# Patient Record
Sex: Male | Born: 1940
Health system: Southern US, Community
[De-identification: ages and names within clinical notes are randomized; demographics above are authoritative.]

## PROBLEM LIST (undated history)

## (undated) DIAGNOSIS — N281 Cyst of kidney, acquired: Secondary | ICD-10-CM

## (undated) DIAGNOSIS — J189 Pneumonia, unspecified organism: Secondary | ICD-10-CM

## (undated) DIAGNOSIS — F329 Major depressive disorder, single episode, unspecified: Secondary | ICD-10-CM

## (undated) DIAGNOSIS — Z973 Presence of spectacles and contact lenses: Secondary | ICD-10-CM

## (undated) DIAGNOSIS — N400 Enlarged prostate without lower urinary tract symptoms: Secondary | ICD-10-CM

## (undated) DIAGNOSIS — E785 Hyperlipidemia, unspecified: Secondary | ICD-10-CM

## (undated) DIAGNOSIS — Z86010 Personal history of colonic polyps: Secondary | ICD-10-CM

## (undated) DIAGNOSIS — Z87442 Personal history of urinary calculi: Secondary | ICD-10-CM

## (undated) DIAGNOSIS — N200 Calculus of kidney: Secondary | ICD-10-CM

## (undated) DIAGNOSIS — Z905 Acquired absence of kidney: Secondary | ICD-10-CM

## (undated) DIAGNOSIS — C679 Malignant neoplasm of bladder, unspecified: Secondary | ICD-10-CM

## (undated) DIAGNOSIS — Z85528 Personal history of other malignant neoplasm of kidney: Secondary | ICD-10-CM

## (undated) DIAGNOSIS — R972 Elevated prostate specific antigen [PSA]: Secondary | ICD-10-CM

## (undated) DIAGNOSIS — F419 Anxiety disorder, unspecified: Secondary | ICD-10-CM

## (undated) DIAGNOSIS — Z860101 Personal history of adenomatous and serrated colon polyps: Secondary | ICD-10-CM

## (undated) DIAGNOSIS — F32A Depression, unspecified: Secondary | ICD-10-CM

## (undated) HISTORY — PX: KNEE SURGERY: SHX244

## (undated) HISTORY — DX: Anxiety disorder, unspecified: F41.9

## (undated) HISTORY — DX: Hyperlipidemia, unspecified: E78.5

## (undated) HISTORY — DX: Major depressive disorder, single episode, unspecified: F32.9

## (undated) HISTORY — PX: APPENDECTOMY: SHX54

## (undated) HISTORY — DX: Depression, unspecified: F32.A

---

## 2000-11-08 ENCOUNTER — Ambulatory Visit (HOSPITAL_COMMUNITY): Admission: RE | Admit: 2000-11-08 | Discharge: 2000-11-08 | Payer: Self-pay | Admitting: Gastroenterology

## 2002-05-02 ENCOUNTER — Emergency Department (HOSPITAL_COMMUNITY): Admission: EM | Admit: 2002-05-02 | Discharge: 2002-05-02 | Payer: Self-pay | Admitting: Emergency Medicine

## 2004-01-18 ENCOUNTER — Encounter: Admission: RE | Admit: 2004-01-18 | Discharge: 2004-01-18 | Payer: Self-pay | Admitting: Family Medicine

## 2004-01-26 ENCOUNTER — Encounter: Admission: RE | Admit: 2004-01-26 | Discharge: 2004-01-26 | Payer: Self-pay | Admitting: Family Medicine

## 2004-02-08 ENCOUNTER — Ambulatory Visit (HOSPITAL_COMMUNITY): Admission: RE | Admit: 2004-02-08 | Discharge: 2004-02-08 | Payer: Self-pay | Admitting: Urology

## 2007-11-14 HISTORY — PX: EXTRACORPOREAL SHOCK WAVE LITHOTRIPSY: SHX1557

## 2010-03-04 ENCOUNTER — Emergency Department (HOSPITAL_COMMUNITY): Admission: EM | Admit: 2010-03-04 | Discharge: 2010-03-04 | Payer: Self-pay | Admitting: Emergency Medicine

## 2010-09-15 ENCOUNTER — Ambulatory Visit: Payer: Self-pay | Admitting: Cardiovascular Disease

## 2011-12-20 DIAGNOSIS — R972 Elevated prostate specific antigen [PSA]: Secondary | ICD-10-CM | POA: Diagnosis not present

## 2011-12-20 DIAGNOSIS — N138 Other obstructive and reflux uropathy: Secondary | ICD-10-CM | POA: Diagnosis not present

## 2011-12-20 DIAGNOSIS — N401 Enlarged prostate with lower urinary tract symptoms: Secondary | ICD-10-CM | POA: Diagnosis not present

## 2012-01-01 DIAGNOSIS — Z79899 Other long term (current) drug therapy: Secondary | ICD-10-CM | POA: Diagnosis not present

## 2012-01-03 DIAGNOSIS — R972 Elevated prostate specific antigen [PSA]: Secondary | ICD-10-CM | POA: Diagnosis not present

## 2012-01-03 DIAGNOSIS — N401 Enlarged prostate with lower urinary tract symptoms: Secondary | ICD-10-CM | POA: Diagnosis not present

## 2012-01-05 DIAGNOSIS — K140 Glossitis: Secondary | ICD-10-CM | POA: Diagnosis not present

## 2012-01-06 ENCOUNTER — Telehealth: Payer: Self-pay

## 2012-01-06 ENCOUNTER — Ambulatory Visit (INDEPENDENT_AMBULATORY_CARE_PROVIDER_SITE_OTHER): Payer: Medicare Other | Admitting: Family Medicine

## 2012-01-06 VITALS — BP 106/63 | HR 60 | Temp 98.0°F | Resp 16 | Ht 65.5 in | Wt 144.2 lb

## 2012-01-06 DIAGNOSIS — Q383 Other congenital malformations of tongue: Secondary | ICD-10-CM | POA: Diagnosis not present

## 2012-01-06 NOTE — Telephone Encounter (Signed)
Would like to speak to someone clinical. Patient of Dr L.  Trying to evaluate if he should come in now or Monday.  reacrion tr

## 2012-01-06 NOTE — Progress Notes (Signed)
71 year old man who comes in with tongue anomaly after being on Lamisil for one month. The problem began about a month ago after he started the Lamisil for a toenail fungus. He saw a provider at the [facility] clinic yesterday who thought that the glossitis might E. coming from the Lamisil and he was referred here. The patient was worried he might have cancer.  Objective: Patient has an atrophic area on the side of his tongue with fissures.  Liver and abdomen are negative.  Assessment: Stricking geographic tongue and glossitis probably related to the Lamisil. Given the fact that onychomycosis usually returns after Lamisil is discontinued, I suggested that patient discontinue Lamisil and see what happens. We will be getting a complete physical in 3 weeks and can follow up at that time.

## 2012-01-09 NOTE — Telephone Encounter (Signed)
Left message to CB

## 2012-01-09 NOTE — Telephone Encounter (Signed)
Pt came in for OV this same afternoon and saw Dr Milus Glazier. No need for call back

## 2012-01-23 ENCOUNTER — Encounter: Payer: Self-pay | Admitting: Cardiovascular Disease

## 2012-03-28 DIAGNOSIS — N201 Calculus of ureter: Secondary | ICD-10-CM | POA: Diagnosis not present

## 2012-03-28 DIAGNOSIS — N200 Calculus of kidney: Secondary | ICD-10-CM | POA: Diagnosis not present

## 2012-03-28 DIAGNOSIS — N281 Cyst of kidney, acquired: Secondary | ICD-10-CM | POA: Diagnosis not present

## 2012-03-28 DIAGNOSIS — R3129 Other microscopic hematuria: Secondary | ICD-10-CM | POA: Diagnosis not present

## 2012-03-28 DIAGNOSIS — K8020 Calculus of gallbladder without cholecystitis without obstruction: Secondary | ICD-10-CM | POA: Diagnosis not present

## 2012-04-15 DIAGNOSIS — N200 Calculus of kidney: Secondary | ICD-10-CM | POA: Diagnosis not present

## 2012-04-17 ENCOUNTER — Encounter: Payer: Self-pay | Admitting: Cardiovascular Disease

## 2012-04-25 ENCOUNTER — Encounter: Payer: Medicare Other | Admitting: Family Medicine

## 2012-05-23 ENCOUNTER — Ambulatory Visit (INDEPENDENT_AMBULATORY_CARE_PROVIDER_SITE_OTHER): Payer: Medicare Other | Admitting: Family Medicine

## 2012-05-23 VITALS — BP 138/88 | HR 50 | Temp 98.3°F | Resp 16 | Ht 65.0 in | Wt 139.8 lb

## 2012-05-23 DIAGNOSIS — L0390 Cellulitis, unspecified: Secondary | ICD-10-CM

## 2012-05-23 DIAGNOSIS — L02419 Cutaneous abscess of limb, unspecified: Secondary | ICD-10-CM | POA: Diagnosis not present

## 2012-05-23 DIAGNOSIS — L03119 Cellulitis of unspecified part of limb: Secondary | ICD-10-CM | POA: Diagnosis not present

## 2012-05-23 MED ORDER — DOXYCYCLINE HYCLATE 100 MG PO TABS: 100.0000 mg | ORAL_TABLET | Freq: Two times a day (BID) | ORAL | Status: AC

## 2012-05-23 NOTE — Patient Instructions (Addendum)

## 2012-05-23 NOTE — Progress Notes (Signed)
3 days of progressive redness and itchiness right leg just below the knee  O:  Vesicular rash with surrounding erythema just below the joint line of the right knee.  A:  Cellulitis, early and mild  1. Cellulitis  doxycycline (VIBRA-TABS) 100 MG tablet

## 2012-06-27 ENCOUNTER — Ambulatory Visit (INDEPENDENT_AMBULATORY_CARE_PROVIDER_SITE_OTHER): Payer: Medicare Other | Admitting: Family Medicine

## 2012-06-27 ENCOUNTER — Encounter: Payer: Self-pay | Admitting: Family Medicine

## 2012-06-27 VITALS — BP 143/67 | HR 53 | Temp 98.1°F | Resp 16 | Ht 64.5 in | Wt 138.0 lb

## 2012-06-27 DIAGNOSIS — J069 Acute upper respiratory infection, unspecified: Secondary | ICD-10-CM

## 2012-06-27 DIAGNOSIS — E785 Hyperlipidemia, unspecified: Secondary | ICD-10-CM | POA: Diagnosis not present

## 2012-06-27 DIAGNOSIS — E559 Vitamin D deficiency, unspecified: Secondary | ICD-10-CM | POA: Diagnosis not present

## 2012-06-27 DIAGNOSIS — J3489 Other specified disorders of nose and nasal sinuses: Secondary | ICD-10-CM | POA: Diagnosis not present

## 2012-06-27 DIAGNOSIS — Z0000 Encounter for general adult medical examination without abnormal findings: Secondary | ICD-10-CM

## 2012-06-27 DIAGNOSIS — R0981 Nasal congestion: Secondary | ICD-10-CM

## 2012-06-27 LAB — COMPREHENSIVE METABOLIC PANEL
ALT: 29 U/L (ref 0–53)
AST: 31 U/L (ref 0–37)
Albumin: 4.5 g/dL (ref 3.5–5.2)
Alkaline Phosphatase: 67 U/L (ref 39–117)
BUN: 23 mg/dL (ref 6–23)
CO2: 28 mEq/L (ref 19–32)
Calcium: 9.6 mg/dL (ref 8.4–10.5)
Chloride: 104 mEq/L (ref 96–112)
Creat: 0.94 mg/dL (ref 0.50–1.35)
Glucose, Bld: 75 mg/dL (ref 70–99)
Potassium: 4.6 mEq/L (ref 3.5–5.3)
Sodium: 140 mEq/L (ref 135–145)
Total Bilirubin: 0.8 mg/dL (ref 0.3–1.2)
Total Protein: 7.1 g/dL (ref 6.0–8.3)

## 2012-06-27 LAB — LIPID PANEL
Cholesterol: 177 mg/dL (ref 0–200)
HDL: 49 mg/dL (ref >39)
LDL Cholesterol: 109 mg/dL — ABNORMAL HIGH (ref 0–99)
Total CHOL/HDL Ratio: 3.6 Ratio
Triglycerides: 94 mg/dL (ref <150)
VLDL: 19 mg/dL (ref 0–40)

## 2012-06-27 LAB — POCT URINALYSIS DIPSTICK
Bilirubin, UA: NEGATIVE
Glucose, UA: NEGATIVE
Ketones, UA: NEGATIVE
Leukocytes, UA: NEGATIVE
Nitrite, UA: NEGATIVE
Protein, UA: NEGATIVE
Spec Grav, UA: >=1.03
Urobilinogen, UA: 0.2
pH, UA: 5.5

## 2012-06-27 LAB — CBC
HCT: 42.7 % (ref 39.0–52.0)
Hemoglobin: 15 g/dL (ref 13.0–17.0)
MCH: 29.5 pg (ref 26.0–34.0)
MCHC: 35.1 g/dL (ref 30.0–36.0)
MCV: 83.9 fL (ref 78.0–100.0)
Platelets: 267 10*3/uL (ref 150–400)
RBC: 5.09 MIL/uL (ref 4.22–5.81)
RDW: 13.1 % (ref 11.5–15.5)
WBC: 8.8 10*3/uL (ref 4.0–10.5)

## 2012-06-27 LAB — IFOBT (OCCULT BLOOD): IFOBT: NEGATIVE

## 2012-06-27 NOTE — Progress Notes (Signed)
@[email or web address] @  Patient ID: Alan Mendez MRN: [id number], DOB: 04-27-1941 71 y.o. Date of Encounter: 06/27/2012, 9:23 AM  Primary Physician: No primary provider on file.  Chief Complaint: Physical (CPE)  HPI: 71 y.o. y/o male with history noted below here for CPE.  Doing well. No issues/complaints.  Review of Systems:  Recent URI x 4 days with sinus congestion Consitutional: No fever, chills, fatigue, night sweats, lymphadenopathy, or weight changes. Eyes: No visual changes, eye redness, or discharge. ENT/Mouth: Ears: No otalgia, tinnitus, hearing loss, discharge. Nose: No congestion, rhinorrhea, sinus pain, or epistaxis. Throat: No sore throat, post nasal drip, or teeth pain. Cardiovascular: No CP, palpitations, diaphoresis, DOE, edema, orthopnea, PND. Respiratory: No cough, hemoptysis, SOB, or wheezing. Gastrointestinal: No anorexia, dysphagia, reflux, pain, nausea, vomiting, hematemesis, diarrhea, constipation, BRBPR, or melena. Genitourinary: No dysuria, frequency, urgency, hematuria, incontinence, nocturia, decreased urinary stream, discharge, impotence, or testicular pain/masses. Musculoskeletal: No decreased ROM, myalgias, stiffness, joint swelling, or weakness. Skin: No rash, erythema, lesion changes, pain, warmth, jaundice, or pruritis. Neurological: No headache, dizziness, syncope, seizures, tremors, memory loss, coordination problems, or paresthesias. Psychological: No anxiety, depression, hallucinations, SI/HI. Endocrine: No fatigue, polydipsia, polyphagia, polyuria, or known diabetes. All other systems were reviewed and are otherwise negative.  Past Medical History  Diagnosis Date  . HLD (hyperlipidemia)   . Depression   . Anxiety   . Chronic kidney disease   . Gall stones      Past Surgical History  Procedure Date  . Appendectomy   . Knee surgery     Home Meds:  Prior to Admission medications   Medication Sig Start Date End Date Taking? Authorizing Provider    buPROPion (WELLBUTRIN XL) 300 MG 24 hr tablet Take 300 mg by mouth daily.   Yes Historical Provider, MD  fish oil-omega-3 fatty acids 1000 MG capsule Take 2 g by mouth daily.   Yes Historical Provider, MD  rosuvastatin (CRESTOR) 10 MG tablet Take 10 mg by mouth daily.   Yes Historical Provider, MD  terbinafine (LAMISIL) 250 MG tablet Take 250 mg by mouth daily.    Historical Provider, MD    Allergies:  Allergies  Allergen Reactions  . Lamisil (Terbinafine Hcl)     History   Social History  . Marital Status: Single    Spouse Name: N/A    Number of Children: N/A  . 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 Smoking status: Former Smoker -- 1.5 packs/day for 25 years    Quit date: 11/13/1988  . Smokeless tobacco: Not on file  . Alcohol Use: 1.0 oz/week    2 drink(s) per week  . Drug Use: Not on file  . Sexually Active: Not on file   Other Topics Concern  . Not on file   Social History Narrative  . No narrative on file    Family History  Problem Relation Age of Onset  . Heart disease Mother   . Cancer Father   . Cancer Paternal Grandmother   . Heart disease Paternal Grandfather     Physical Exam: Blood pressure 143/67, pulse 53, temperature 98.1 F (36.7 C), resp. rate 16, height 5' 4.5" (1.638 m), weight 138 lb (62.596 kg).  General: Well developed, well nourished, in no acute distress. HEENT: Normocephalic, atraumatic. Conjunctiva pink, sclera non-icteric. Pupils 2 mm constricting to 1 mm, round, regular, and equally reactive to light and accomodation. EOMI. Internal auditory canal clear. TMs  with good cone of light and without pathology. Nasal mucosa pink. Nares are without discharge. No sinus tenderness. Oral mucosa pink. Dentition good. Pharynx without exudate.   Neck: Supple. Trachea midline. No thyromegaly. Full ROM. No lymphadenopathy. Lungs: Clear to auscultation bilaterally without wheezes,  rales, or rhonchi. Breathing is of normal effort and unlabored. Cardiovascular: RRR with S1 S2. No murmurs, rubs, or gallops appreciated. Distal pulses 2+ symmetrically. No carotid or abdominal bruits Abdomen: Soft, non-tender, non-distended with normoactive bowel sounds. No hepatosplenomegaly or masses. No rebound/guarding. No CVA tenderness. Without hernias.  Rectal: No external hemorrhoids or fissures. Rectal vault without masses.  Genitourinary:  circumcised male. No penile lesions. Testes descended bilaterally, and smooth without tenderness or masses.  Musculoskeletal: Full range of motion and 5/5 strength throughout. Without swelling, atrophy, tenderness, crepitus, or warmth. Extremities without clubbing, cyanosis, or edema. Calves supple. Skin: Warm and moist without erythema, ecchymosis, wounds, or rash. Neuro: A+Ox3. CN II-XII grossly intact. Moves all extremities spontaneously. Full sensation throughout. Normal gait. DTR 2+ throughout upper and lower extremities. Finger to nose intact. Psych:  Responds to questions appropriately with a normal affect.   Studies:  Results for orders placed in visit on 06/27/12  IFOBT (OCCULT BLOOD)      Component Value Range   IFOBT Negative    POCT URINALYSIS DIPSTICK      Component Value Range   Color, UA yellow     Clarity, UA clear     Glucose, UA neg     Bilirubin, UA neg     Ketones, UA neg     Spec Grav, UA >=1.030     Blood, UA trace     pH, UA 5.5     Protein, UA neg     Urobilinogen, UA 0.2     Nitrite, UA neg     Leukocytes, UA Negative      EKG:  nonsp st-t wave changes  Assessment/Plan:  71 y.o. y/o  male here for CPE - 1. Healthcare maintenance  Ambulatory referral to Gastroenterology, CBC, Lipid panel, Comprehensive metabolic panel, IFOBT POC (occult bld, rslt in office), POCT urinalysis dipstick, Vitamin D, 25-hydroxy  2. Vitamin d deficiency  Vitamin D, 25-hydroxy  3. Hyperlipidemia  Lipid panel  4. Sinus congestion   CBC  5. Acute URI  CBC     Signed, Elvina Sidle, MD 06/27/2012 9:23 AM

## 2012-06-28 LAB — VITAMIN D 25 HYDROXY (VIT D DEFICIENCY, FRACTURES): Vit D, 25-Hydroxy: 47 ng/mL (ref 30–89)

## 2012-07-08 ENCOUNTER — Encounter: Payer: Self-pay | Admitting: Family Medicine

## 2012-07-08 DIAGNOSIS — H2510 Age-related nuclear cataract, unspecified eye: Secondary | ICD-10-CM | POA: Diagnosis not present

## 2012-07-29 DIAGNOSIS — K5900 Constipation, unspecified: Secondary | ICD-10-CM | POA: Diagnosis not present

## 2012-07-29 DIAGNOSIS — R197 Diarrhea, unspecified: Secondary | ICD-10-CM | POA: Diagnosis not present

## 2012-07-29 DIAGNOSIS — Z1211 Encounter for screening for malignant neoplasm of colon: Secondary | ICD-10-CM | POA: Diagnosis not present

## 2012-08-15 DIAGNOSIS — Z1211 Encounter for screening for malignant neoplasm of colon: Secondary | ICD-10-CM | POA: Diagnosis not present

## 2012-08-15 DIAGNOSIS — K649 Unspecified hemorrhoids: Secondary | ICD-10-CM | POA: Diagnosis not present

## 2012-08-15 DIAGNOSIS — D126 Benign neoplasm of colon, unspecified: Secondary | ICD-10-CM | POA: Diagnosis not present

## 2012-08-15 DIAGNOSIS — K5730 Diverticulosis of large intestine without perforation or abscess without bleeding: Secondary | ICD-10-CM | POA: Diagnosis not present

## 2012-08-15 HISTORY — PX: COLONOSCOPY: SHX174

## 2012-09-03 ENCOUNTER — Encounter: Payer: Self-pay | Admitting: Family Medicine

## 2012-09-03 DIAGNOSIS — K5790 Diverticulosis of intestine, part unspecified, without perforation or abscess without bleeding: Secondary | ICD-10-CM

## 2012-09-12 ENCOUNTER — Telehealth: Payer: Self-pay

## 2012-09-12 NOTE — Telephone Encounter (Signed)
Pt is out of Crestor and needs refill, but wanted to know if Dr. Milus Glazier recommended a less expensive generic, only if it is as good.  [phone] pt number  [employer]

## 2012-09-13 ENCOUNTER — Other Ambulatory Visit: Payer: Self-pay | Admitting: Family Medicine

## 2012-09-13 DIAGNOSIS — E785 Hyperlipidemia, unspecified: Secondary | ICD-10-CM

## 2012-09-13 DIAGNOSIS — Z23 Encounter for immunization: Secondary | ICD-10-CM | POA: Diagnosis not present

## 2012-09-13 MED ORDER — ROSUVASTATIN CALCIUM 10 MG PO TABS: 10.0000 mg | ORAL_TABLET | Freq: Every day | ORAL | Status: DC

## 2012-09-13 NOTE — Telephone Encounter (Signed)
Thanks, I have advised patient.  

## 2012-09-13 NOTE — Telephone Encounter (Signed)
Crestor is the safest and most effective.  I refilled it

## 2012-10-04 ENCOUNTER — Encounter: Payer: Self-pay | Admitting: Family Medicine

## 2012-12-31 DIAGNOSIS — N138 Other obstructive and reflux uropathy: Secondary | ICD-10-CM | POA: Diagnosis not present

## 2012-12-31 DIAGNOSIS — R972 Elevated prostate specific antigen [PSA]: Secondary | ICD-10-CM | POA: Diagnosis not present

## 2012-12-31 DIAGNOSIS — N401 Enlarged prostate with lower urinary tract symptoms: Secondary | ICD-10-CM | POA: Diagnosis not present

## 2013-01-28 DIAGNOSIS — R972 Elevated prostate specific antigen [PSA]: Secondary | ICD-10-CM | POA: Diagnosis not present

## 2013-08-27 DIAGNOSIS — Z23 Encounter for immunization: Secondary | ICD-10-CM | POA: Diagnosis not present

## 2013-09-18 ENCOUNTER — Ambulatory Visit (INDEPENDENT_AMBULATORY_CARE_PROVIDER_SITE_OTHER): Payer: Medicare Other | Admitting: Family Medicine

## 2013-09-18 ENCOUNTER — Encounter: Payer: Self-pay | Admitting: Family Medicine

## 2013-09-18 VITALS — BP 122/70 | HR 48 | Temp 98.2°F | Resp 16 | Ht 64.5 in | Wt 139.0 lb

## 2013-09-18 DIAGNOSIS — M4120 Other idiopathic scoliosis, site unspecified: Secondary | ICD-10-CM | POA: Insufficient documentation

## 2013-09-18 DIAGNOSIS — N400 Enlarged prostate without lower urinary tract symptoms: Secondary | ICD-10-CM

## 2013-09-18 DIAGNOSIS — Z0000 Encounter for general adult medical examination without abnormal findings: Secondary | ICD-10-CM | POA: Diagnosis not present

## 2013-09-18 DIAGNOSIS — N138 Other obstructive and reflux uropathy: Secondary | ICD-10-CM | POA: Insufficient documentation

## 2013-09-18 DIAGNOSIS — Z23 Encounter for immunization: Secondary | ICD-10-CM

## 2013-09-18 DIAGNOSIS — E785 Hyperlipidemia, unspecified: Secondary | ICD-10-CM

## 2013-09-18 LAB — POCT URINALYSIS DIPSTICK
Bilirubin, UA: NEGATIVE
Blood, UA: NEGATIVE
Glucose, UA: NEGATIVE
Ketones, UA: NEGATIVE
Leukocytes, UA: NEGATIVE
Nitrite, UA: NEGATIVE
Protein, UA: NEGATIVE
Spec Grav, UA: 1.025
Urobilinogen, UA: 1
pH, UA: 5.5

## 2013-09-18 LAB — COMPREHENSIVE METABOLIC PANEL
ALT: 23 U/L (ref 0–53)
AST: 32 U/L (ref 0–37)
Albumin: 4.4 g/dL (ref 3.5–5.2)
Alkaline Phosphatase: 69 U/L (ref 39–117)
BUN: 19 mg/dL (ref 6–23)
CO2: 28 mEq/L (ref 19–32)
Calcium: 10.2 mg/dL (ref 8.4–10.5)
Chloride: 103 mEq/L (ref 96–112)
Creat: 0.84 mg/dL (ref 0.50–1.35)
Glucose, Bld: 82 mg/dL (ref 70–99)
Potassium: 4.3 mEq/L (ref 3.5–5.3)
Sodium: 141 mEq/L (ref 135–145)
Total Bilirubin: 0.8 mg/dL (ref 0.3–1.2)
Total Protein: 7 g/dL (ref 6.0–8.3)

## 2013-09-18 LAB — CBC
HCT: 44.3 % (ref 39.0–52.0)
Hemoglobin: 15.8 g/dL (ref 13.0–17.0)
MCH: 29.2 pg (ref 26.0–34.0)
MCHC: 35.7 g/dL (ref 30.0–36.0)
MCV: 81.9 fL (ref 78.0–100.0)
Platelets: 284 10*3/uL (ref 150–400)
RBC: 5.41 MIL/uL (ref 4.22–5.81)
RDW: 13.4 % (ref 11.5–15.5)
WBC: 5.1 10*3/uL (ref 4.0–10.5)

## 2013-09-18 LAB — LIPID PANEL
Cholesterol: 203 mg/dL — ABNORMAL HIGH (ref 0–200)
HDL: 55 mg/dL (ref >39)
LDL Cholesterol: 131 mg/dL — ABNORMAL HIGH (ref 0–99)
Total CHOL/HDL Ratio: 3.7 Ratio
Triglycerides: 86 mg/dL (ref <150)
VLDL: 17 mg/dL (ref 0–40)

## 2013-09-18 MED ORDER — TETANUS-DIPHTH-ACELL PERTUSSIS 5-2.5-18.5 LF-MCG/0.5 IM SUSP: 0.5000 mL | Freq: Once | INTRAMUSCULAR | Status: DC

## 2013-09-18 MED ORDER — ROSUVASTATIN CALCIUM 10 MG PO TABS: 10.0000 mg | ORAL_TABLET | Freq: Every day | ORAL | Status: DC

## 2013-09-18 NOTE — Patient Instructions (Signed)
Health Maintenance, Males A healthy lifestyle and preventative care can promote health and wellness.  Maintain regular health, dental, and eye exams.  Eat a healthy diet. Foods like vegetables, fruits, whole grains, low-fat dairy products, and lean protein foods contain the nutrients you need without too many calories. Decrease your intake of foods high in solid fats, added sugars, and salt. Get information about a proper diet from your caregiver, if necessary.  Regular physical exercise is one of the most important things you can do for your health. Most adults should get at least 150 minutes of moderate-intensity exercise (any activity that increases your heart rate and causes you to sweat) each week. In addition, most adults need muscle-strengthening exercises on 2 or more days a week.   Maintain a healthy weight. The body mass index (BMI) is a screening tool to identify possible weight problems. It provides an estimate of body fat based on height and weight. Your caregiver can help determine your BMI, and can help you achieve or maintain a healthy weight. For adults 20 years and older:  A BMI below 18.5 is considered underweight.  A BMI of 18.5 to 24.9 is normal.  A BMI of 25 to 29.9 is considered overweight.  A BMI of 30 and above is considered obese.  Maintain normal blood lipids and cholesterol by exercising and minimizing your intake of saturated fat. Eat a balanced diet with plenty of fruits and vegetables. Blood tests for lipids and cholesterol should begin at age 20 and be repeated every 5 years. If your lipid or cholesterol levels are high, you are over 50, or you are a high risk for heart disease, you may need your cholesterol levels checked more frequently.Ongoing high lipid and cholesterol levels should be treated with medicines, if diet and exercise are not effective.  If you smoke, find out from your caregiver how to quit. If you do not use tobacco, do not start.  Lung  cancer screening is recommended for adults aged 55 80 years who are at high risk for developing lung cancer because of a history of smoking. Yearly low-dose computed tomography (CT) is recommended for people who have at least a 30-pack-year history of smoking and are a current smoker or have quit within the past 15 years. A pack year of smoking is smoking an average of 1 pack of cigarettes a day for 1 year (for example: 1 pack a day for 30 years or 2 packs a day for 15 years). Yearly screening should continue until the smoker has stopped smoking for at least 15 years. Yearly screening should also be stopped for people who develop a health problem that would prevent them from having lung cancer treatment.  If you choose to drink alcohol, do not exceed 2 drinks per day. One drink is considered to be 12 ounces (355 mL) of beer, 5 ounces (148 mL) of wine, or 1.5 ounces (44 mL) of liquor.  Avoid use of street drugs. Do not share needles with anyone. Ask for help if you need support or instructions about stopping the use of drugs.  High blood pressure causes heart disease and increases the risk of stroke. Blood pressure should be checked at least every 1 to 2 years. Ongoing high blood pressure should be treated with medicines if weight loss and exercise are not effective.  If you are 45 to 70 years old, ask your caregiver if you should take aspirin to prevent heart disease.  Diabetes screening involves taking a blood   sample to check your fasting blood sugar level. This should be done once every 3 years, after age 69, if you are within normal weight and without risk factors for diabetes. Testing should be considered at a younger age or be carried out more frequently if you are overweight and have at least 1 risk factor for diabetes.  Colorectal cancer can be detected and often prevented. Most routine colorectal cancer screening begins at the age of 3 and continues through age 32. However, your caregiver may  recommend screening at an earlier age if you have risk factors for colon cancer. On a yearly basis, your caregiver may provide home test kits to check for hidden blood in the stool. Use of a small camera at the end of a tube, to directly examine the colon (sigmoidoscopy or colonoscopy), can detect the earliest forms of colorectal cancer. Talk to your caregiver about this at age 85, when routine screening begins. Direct examination of the colon should be repeated every 5 to 10 years through age 103, unless early forms of pre-cancerous polyps or small growths are found.  Hepatitis C blood testing is recommended for all people born from [birth year] through 1963 and any individual with known risks for hepatitis C.  Healthy men should no longer receive prostate-specific antigen (PSA) blood tests as part of routine cancer screening. Consult with your caregiver about prostate cancer screening.  Testicular cancer screening is not recommended for adolescents or adult males who have no symptoms. Screening includes self-exam, caregiver exam, and other screening tests. Consult with your caregiver about any symptoms you have or any concerns you have about testicular cancer.  Practice safe sex. Use condoms and avoid high-risk sexual practices to reduce the spread of sexually transmitted infections (STIs).  Use sunscreen. Apply sunscreen liberally and repeatedly throughout the day. You should seek shade when your shadow is shorter than you. Protect yourself by wearing long sleeves, pants, a wide-brimmed hat, and sunglasses year round, whenever you are outdoors.  Notify your caregiver of new moles or changes in moles, especially if there is a change in shape or color. Also notify your caregiver if a mole is larger than the size of a pencil eraser.  A one-time screening for abdominal aortic aneurysm (AAA) and surgical repair of large AAAs by sound wave imaging (ultrasonography) is recommended for ages 59 to 68 years who are  current or former smokers.  Stay current with your immunizations. Document Released: 04/27/2008 Document Revised: 02/24/2013 Document Reviewed: 03/27/2011 [facility] Patient Information 2014 [place], [place]. Advance Directive Advance directives are the legal documents that allow you to make choices about your health care and medical treatment if you cannot speak for yourself. Advance directives are a way for you to communicate your wishes to family, friends, and health care providers. The specified people can then convey your decisions about end-of-life care to avoid confusion if you should become unable to communicate. Ideally, the process of discussing and writing advance directives should be discussed over time rather than making decisions all at once. Advance directives can be modified as your situation changes and you can change your mind at any time even after you have signed the advance directives. Each state has its own laws regarding advance directives. You may want to check with your health care provider, attorney, or state representative about the law in your state. Below are some examples of advance directives. LIVING WILL A living will is a set of instructions documenting your wishes about medical care when you  cannot care for yourself. It is used if you become:  Terminally ill.  Incapacitated.  Unable to communicate.  Unable to make decisions. Items to consider in your living will include:  The use or non-use of life-sustaining equipment, such as dialysis machines and breathing machines (ventilators).  A "do not resuscitate" (DNR) order, which is the instruction not to use CPR if breathing or heartbeat stops.  Tube feeding.  Withholding of food and fluids.  Comfort (palliative) care when the goal becomes comfort rather than a cure.  Organ and tissue donation. A living does not give instructions about distribution of your money and property if you should pass away. It is  advisable to seek the expert advice of a lawyer in drawing up a will regarding your possessions. Decisions about taxes, beneficiaries, and asset distribution will be legally binding. This process can relieve your family and friends of any burdens surrounding disputes or questions that may come up about the allocation of your assets. DO NOT RESUSCITATE (DNR) A do not resuscitate (DNR) order is a request to not have cardiopulmonary resuscitation (CPR) in the event that your heart stops or you stop breathing. Unless given other instructions, a health care provider will try to help any patient whose heart has stopped or who has stopped breathing.  HEALTHCARE PROXY AND DURABLE POWER OF ATTORNEY FOR HEALTH CARE A health care proxy is a person (agent) appointed to make medical decisions for you if you cannot. Generally, people choose someone they know well and trust to represent their preferences when they can no longer do so. You should be sure to ask this person for agreement to act as your agent. An agent may have to exercise judgment in the event of a medical decision for which your wishes are not known. The durable power of attorney for health care is the legal document that names your health care proxy. Once written, it should be:  Signed.  Notarized.  Dated.  Copied.  Witnessed.  Incorporated into your medical record. You may also want to appoint someone to manage your financial affairs if you cannot. This is called a durable power of attorney for finances. It is a separate legal document from the durable power of attorney for health care. You may choose the same person or someone different from your health care proxy to act as your agent in financial matters. Document Released: 02/06/2008 Document Revised: 07/02/2013 Document Reviewed: 03/19/2013 [facility] Patient Information 2014 [place], [place].

## 2013-09-18 NOTE — Progress Notes (Addendum)
Patient ID: Alan Mendez MRN: [id number], DOB: 05-Sep-1941 72 y.o. Date of Encounter: 09/18/2013, 7:55 AM  Primary Physician: No primary provider on file.  Chief Complaint: Physical (CPE)  HPI: 72 y.o. y/o male with history noted below here for CPE.  Married. 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  Review of Systems: Consitutional: No fever, chills, fatigue, night sweats, lymphadenopathy, or weight changes. Eyes: No visual changes, eye redness, or discharge. ENT/Mouth: Ears: No otalgia, tinnitus, hearing loss, discharge. Nose: No congestion, rhinorrhea, sinus pain, or epistaxis. Throat: No sore throat, post nasal drip, or teeth pain. Cardiovascular: No CP, palpitations, diaphoresis, DOE, edema, orthopnea, PND. Respiratory: No cough, hemoptysis, SOB, or wheezing. Gastrointestinal: No anorexia, dysphagia, reflux, pain, nausea, vomiting, hematemesis, diarrhea, constipation, BRBPR, or melena. Genitourinary: No dysuria, frequency, urgency, hematuria, incontinence, nocturia, decreased urinary stream, discharge, impotence, or testicular pain/masses. Musculoskeletal: No decreased ROM, myalgias, stiffness, joint swelling, or weakness. Skin: No rash, erythema, lesion changes, pain, warmth, jaundice, or pruritis. Neurological: No headache, dizziness, syncope, seizures, tremors, memory loss, coordination problems, or paresthesias. Psychological: No anxiety, depression, hallucinations, SI/HI. Endocrine: No fatigue, polydipsia, polyphagia, polyuria, or known diabetes. All other systems were reviewed and are otherwise negative.  Past Medical History  Diagnosis Date  . HLD (hyperlipidemia)   . Depression   . Anxiety   . Chronic kidney disease   . Gall stones      Past Surgical History  Procedure Laterality Date  . Appendectomy    . Knee surgery    . Colonoscopy  08/15/2012    Tubular Adenoma, No high grade dysplasia or malignacy.    Home Meds:  Prior to  Admission medications   Medication Sig Start Date End Date Taking? Authorizing Provider  aspirin 81 MG tablet Take 81 mg by mouth daily.   Yes Historical Provider, MD  Multiple Vitamins-Minerals (MULTIVITAMIN WITH MINERALS) tablet Take 1 tablet by mouth daily.   Yes Historical Provider, MD  rosuvastatin (CRESTOR) 10 MG tablet Take 1 tablet (10 mg total) by mouth daily. 09/13/12  Yes Elvina Sidle, MD  buPROPion (WELLBUTRIN XL) 300 MG 24 hr tablet Take 300 mg by mouth daily.    Historical Provider, MD    Allergies:  Allergies  Allergen Reactions  . Lamisil [Terbinafine Hcl]     History   Social History  . Marital Status: Single    Spouse Name: N/A    Number of Children: N/A  . 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 Smoking status: Former Smoker -- 1.50 packs/day for 25 years    Quit date: 11/13/1988  . Smokeless tobacco: Not on file  . Alcohol Use: 1.0 oz/week    2 drink(s) per week  . Drug Use: Not on file  . Sexual Activity: Not on file   Other Topics Concern  . Not on file   Social History Narrative  . No narrative on file    Family History  Problem Relation Age of Onset  . Heart disease Mother   . Cancer Father   . Cancer Paternal Grandmother   . Heart disease Paternal Grandfather     Physical Exam: Blood pressure 122/70, pulse 48, temperature 98.2 F (36.8 C), temperature source Oral, resp. rate 16, height 5' 4.5" (1.638 m), weight 139 lb (63.05 kg), SpO2 100.00%.  General: Well developed, well nourished, in no acute distress. HEENT: Normocephalic, atraumatic. Conjunctiva pink, sclera  non-icteric. Pupils 2 mm constricting to 1 mm, round, regular, and equally reactive to light and accomodation. EOMI. Internal auditory canal clear. TMs with good cone of light and without pathology. Nasal mucosa pink. Nares are without discharge. No sinus tenderness. Oral mucosa pink. Dentition excellent. Pharynx  without exudate.   Neck: Supple. Trachea midline. No thyromegaly. Full ROM. No lymphadenopathy. Lungs: Clear to auscultation bilaterally without wheezes, rales, or rhonchi. Breathing is of normal effort and unlabored. Cardiovascular: RRR with S1 S2. No murmurs, rubs, or gallops appreciated. Distal pulses 2+ symmetrically. No carotid or abdominal bruits Abdomen: Soft, non-tender, non-distended with normoactive bowel sounds. No hepatosplenomegaly or masses. No rebound/guarding. No CVA tenderness. Without hernias.   Genitourinary:  circumcised male. No penile lesions. Testes descended bilaterally, and smooth without tenderness or masses.  Musculoskeletal: Full range of motion and 5/5 strength throughout. Without swelling, atrophy, tenderness, crepitus, or warmth. Extremities without clubbing, cyanosis, or edema. Calves supple.  Scoliosis with left sided thoracic paraspinal hump. Skin: Warm and moist without erythema, ecchymosis, wounds, or rash.  Patient has 1 cm right retro auricular fleshy sub q mass over mastoid which has been evaluated by dermatology in past and has not changed.  He was told it was benign. Neuro: A+Ox3. CN II-XII grossly intact. Moves all extremities spontaneously. Full sensation throughout. Normal gait. DTR 2+ throughout upper and lower extremities. Finger to nose intact. Psych:  Responds to questions appropriately with a normal affect.   Studies: CBC, CMET, Lipid UA:  Results for orders placed in visit on 09/18/13  POCT URINALYSIS DIPSTICK      Result Value Range   Color, UA yellow     Clarity, UA clear     Glucose, UA neg     Bilirubin, UA neg     Ketones, UA neg     Spec Grav, UA 1.025     Blood, UA neg     pH, UA 5.5     Protein, UA neg     Urobilinogen, UA 1.0     Nitrite, UA neg     Leukocytes, UA Negative       Assessment/Plan:  72 y.o. y/o  male here for CPE.diag Annual physical exam - Plan: CBC, Lipid panel, POCT urinalysis dipstick, Comprehensive  metabolic panel, TDaP (BOOSTRIX) injection 0.5 mL  BPH (benign prostatic hyperplasia)  Idiopathic scoliosis  Hyperlipidemia - Plan: rosuvastatin (CRESTOR) 10 MG tablet, CBC, Lipid panel, POCT urinalysis dipstick, Comprehensive metabolic panel  Need for prophylactic vaccination with combined diphtheria-tetanus-pertussis (DTP) vaccine - Plan: CANCELED: Tdap vaccine greater than or equal to 7yo IM  Signed, Elvina Sidle, MD 09/18/2013 7:55 AM

## 2013-10-17 DIAGNOSIS — H2510 Age-related nuclear cataract, unspecified eye: Secondary | ICD-10-CM | POA: Diagnosis not present

## 2014-02-24 DIAGNOSIS — R972 Elevated prostate specific antigen [PSA]: Secondary | ICD-10-CM | POA: Diagnosis not present

## 2014-03-03 DIAGNOSIS — R972 Elevated prostate specific antigen [PSA]: Secondary | ICD-10-CM | POA: Diagnosis not present

## 2014-03-03 DIAGNOSIS — N401 Enlarged prostate with lower urinary tract symptoms: Secondary | ICD-10-CM | POA: Diagnosis not present

## 2014-05-21 ENCOUNTER — Telehealth: Payer: Self-pay | Admitting: Family Medicine

## 2014-05-21 DIAGNOSIS — E785 Hyperlipidemia, unspecified: Secondary | ICD-10-CM

## 2014-08-31 DIAGNOSIS — H43813 Vitreous degeneration, bilateral: Secondary | ICD-10-CM | POA: Diagnosis not present

## 2014-09-10 DIAGNOSIS — Z23 Encounter for immunization: Secondary | ICD-10-CM | POA: Diagnosis not present

## 2014-10-19 DIAGNOSIS — H43811 Vitreous degeneration, right eye: Secondary | ICD-10-CM | POA: Diagnosis not present

## 2014-10-19 DIAGNOSIS — H2513 Age-related nuclear cataract, bilateral: Secondary | ICD-10-CM | POA: Diagnosis not present

## 2014-10-28 ENCOUNTER — Telehealth: Payer: Self-pay

## 2014-10-28 NOTE — Telephone Encounter (Signed)
Dr.Lauenstein, Pt states that his b/p reading was 150/85 today he would like to know if he should be worried. [phone]

## 2014-10-28 NOTE — Telephone Encounter (Signed)
Spoke to pt, he was at the dentist with this reading, and his bp usually goes up with his appointments.  i explained to him the signs and symptoms of a heart attack. To be aware of any chest pain, arm pain, dizziness, blurred vision, headache, back pain, nausea. If he feels theses symptoms please be evaluated or go to the ED. He states h has an appointment t the end of January and will follow up at that time unless he experiences any of these symptoms.

## 2014-12-10 ENCOUNTER — Ambulatory Visit (INDEPENDENT_AMBULATORY_CARE_PROVIDER_SITE_OTHER): Payer: Medicare Other | Admitting: Family Medicine

## 2014-12-10 ENCOUNTER — Encounter: Payer: Self-pay | Admitting: Family Medicine

## 2014-12-10 VITALS — BP 140/76 | HR 52 | Temp 98.7°F | Resp 16 | Ht 64.5 in | Wt 139.6 lb

## 2014-12-10 DIAGNOSIS — S46212A Strain of muscle, fascia and tendon of other parts of biceps, left arm, initial encounter: Secondary | ICD-10-CM

## 2014-12-10 DIAGNOSIS — E785 Hyperlipidemia, unspecified: Secondary | ICD-10-CM | POA: Diagnosis not present

## 2014-12-10 DIAGNOSIS — Z0000 Encounter for general adult medical examination without abnormal findings: Secondary | ICD-10-CM | POA: Diagnosis not present

## 2014-12-10 LAB — CBC WITH DIFFERENTIAL/PLATELET
Basophils Absolute: 0 10*3/uL (ref 0.0–0.1)
Basophils Relative: 0 % (ref 0–1)
Eosinophils Absolute: 0.2 10*3/uL (ref 0.0–0.7)
Eosinophils Relative: 2 % (ref 0–5)
HCT: 46.5 % (ref 39.0–52.0)
Hemoglobin: 16.2 g/dL (ref 13.0–17.0)
Lymphocytes Relative: 16 % (ref 12–46)
Lymphs Abs: 1.6 10*3/uL (ref 0.7–4.0)
MCH: 29.5 pg (ref 26.0–34.0)
MCHC: 34.8 g/dL (ref 30.0–36.0)
MCV: 84.7 fL (ref 78.0–100.0)
MPV: 10.6 fL (ref 8.6–12.4)
Monocytes Absolute: 0.7 10*3/uL (ref 0.1–1.0)
Monocytes Relative: 7 % (ref 3–12)
Neutro Abs: 7.3 10*3/uL (ref 1.7–7.7)
Neutrophils Relative %: 75 % (ref 43–77)
Platelets: 252 10*3/uL (ref 150–400)
RBC: 5.49 MIL/uL (ref 4.22–5.81)
RDW: 13.5 % (ref 11.5–15.5)
WBC: 9.7 10*3/uL (ref 4.0–10.5)

## 2014-12-10 LAB — POCT URINALYSIS DIPSTICK
Bilirubin, UA: NEGATIVE
Glucose, UA: NEGATIVE
Leukocytes, UA: NEGATIVE
Nitrite, UA: NEGATIVE
Protein, UA: NEGATIVE
Spec Grav, UA: >=1.03
Urobilinogen, UA: 0.2
pH, UA: 5

## 2014-12-10 LAB — COMPLETE METABOLIC PANEL WITH GFR
ALT: 25 U/L (ref 0–53)
AST: 31 U/L (ref 0–37)
Albumin: 4.5 g/dL (ref 3.5–5.2)
Alkaline Phosphatase: 55 U/L (ref 39–117)
BUN: 23 mg/dL (ref 6–23)
CO2: 27 mEq/L (ref 19–32)
Calcium: 9.5 mg/dL (ref 8.4–10.5)
Chloride: 103 mEq/L (ref 96–112)
Creat: 0.85 mg/dL (ref 0.50–1.35)
GFR, Est African American: >89 mL/min
GFR, Est Non African American: 88 mL/min
Glucose, Bld: 77 mg/dL (ref 70–99)
Potassium: 4.1 mEq/L (ref 3.5–5.3)
Sodium: 139 mEq/L (ref 135–145)
Total Bilirubin: 1.2 mg/dL (ref 0.2–1.2)
Total Protein: 7.2 g/dL (ref 6.0–8.3)

## 2014-12-10 LAB — LIPID PANEL
Cholesterol: 192 mg/dL (ref 0–200)
HDL: 53 mg/dL (ref >39)
LDL Cholesterol: 121 mg/dL — ABNORMAL HIGH (ref 0–99)
Total CHOL/HDL Ratio: 3.6 Ratio
Triglycerides: 88 mg/dL (ref <150)
VLDL: 18 mg/dL (ref 0–40)

## 2014-12-10 MED ORDER — ROSUVASTATIN CALCIUM 10 MG PO TABS: 10.0000 mg | ORAL_TABLET | Freq: Every day | ORAL | Status: DC

## 2014-12-10 NOTE — Progress Notes (Addendum)
Patient ID: Alan Mendez, male   DOB: [birth year], 74 y.o.   MRN: [id number]  This chart was scribed for Robyn Haber, MD by Ladene Artist, ED Scribe. The patient was seen in room 24. Patient's care was started at 3:01 PM.  Patient ID: Alan Mendez MRN: [id number], DOB: 1941-03-02, 74 y.o. Date of Encounter: 12/10/2014, 3:01 PM  Primary Physician: No primary care provider on file.  Chief Complaint  Patient presents with   Annual Exam    AWV-S Subsequent    HPI: 74 y.o. year old male with history below presents for an annual exam.  She is married and has 3 sons, none of which lives close by. His older son is doing well, his middle son who is gay has had a tough time but seems to be doing better nowadays, and his youngest son just about through an ugly divorce.  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 He's about 7 pounds less than he was last here.  Several weeks ago patient experienced sharp pain in left arm when playing with grandchild.  This was accompanied by persistent swelling in left anterior humerus area distally.  Patient is a former [employer].   Past Medical History  Diagnosis Date   HLD (hyperlipidemia)    Depression    Anxiety    Chronic kidney disease    Gall stones      Home Meds: Prior to Admission medications   Medication Sig Start Date End Date Taking? Authorizing Provider  aspirin 81 MG tablet Take 81 mg by mouth daily.    Historical Provider, MD  Multiple Vitamins-Minerals (MULTIVITAMIN WITH MINERALS) tablet Take 1 tablet by mouth daily.    Historical Provider, MD  rosuvastatin (CRESTOR) 10 MG tablet Take 1 tablet (10 mg total) by mouth daily. 09/18/13   Robyn Haber, MD    Allergies:  Allergies  Allergen Reactions   Lamisil [Terbinafine Hcl]     History   Social History   Marital Status: Single    Spouse Name: N/A    Number of Children: N/A   Years  of Education: N/A   Occupational History   Scientist, physiological - [employer]    Social History Main Topics   Smoking status: Former Smoker -- 1.50 packs/day for 25 years    Quit date: 11/13/1988   Smokeless tobacco: Not on file   Alcohol Use: 1.0 oz/week    2 drink(s) per week   Drug Use: Not on file   Sexual Activity: Not on file   Other Topics Concern   Not on file   Social History Narrative     Review of Systems: Constitutional: negative for chills, fever, night sweats, weight changes, or fatigue  HEENT: negative for vision changes, hearing loss, congestion, rhinorrhea, ST, epistaxis, or sinus pressure Cardiovascular: negative for chest pain or palpitations Respiratory: negative for hemoptysis, wheezing, shortness of breath, or cough Abdominal: negative for abdominal pain, nausea, vomiting, diarrhea, or constipation Dermatological: negative for rash Neurologic: negative for headache, dizziness, or syncope All other systems reviewed and are otherwise negative with the exception to those above and in the HPI.   Physical Exam: Blood pressure 140/76, pulse 52, temperature 98.7 F (37.1 C), temperature source Oral, resp. rate 16, height 5' 4.5" (1.638 m), weight 139 lb 9.6 oz (63.322 kg), SpO2 98 %., Body mass index is 23.6 kg/(m^2). General: Well developed, well nourished, in no acute distress. Head:  Normocephalic, atraumatic, eyes without discharge, sclera non-icteric, nares are without discharge. Bilateral auditory canals clear, TM's are without perforation, pearly grey and translucent with reflective cone of light bilaterally. Oral cavity moist, posterior pharynx without exudate, erythema, peritonsillar abscess, or post nasal drip.  Neck: Supple. No thyromegaly. Full ROM. No lymphadenopathy. Lungs: Clear bilaterally to auscultation without wheezes, rales, or rhonchi. Breathing is unlabored. Heart: RRR with S1 S2. No murmurs, rubs, or gallops appreciated. Abdomen: Soft, non-tender,  non-distended with normoactive bowel sounds. No hepatomegaly. No rebound/guarding. No obvious abdominal masses. Msk:  Strength and tone normal for age. Extremities/Skin: Warm and dry. No clubbing or cyanosis. No edema. No rashes or suspicious lesions.  There is a distal biceps swelling on the left. Neuro: Alert and oriented X 3. Moves all extremities spontaneously. Gait is normal. CNII-XII grossly in tact. Psych:  Responds to questions appropriately with a normal affect.   Labs:  Results for orders placed or performed in visit on 12/10/14  POCT urinalysis dipstick  Result Value Ref Range   Color, UA yellow    Clarity, UA clear    Glucose, UA neg    Bilirubin, UA neg    Ketones, UA trace    Spec Grav, UA >=1.030    Blood, UA small    pH, UA 5.0    Protein, UA neg    Urobilinogen, UA 0.2    Nitrite, UA neg    Leukocytes, UA Negative       ASSESSMENT AND PLAN:  74 y.o. year old male with  Annual physical exam - Plan: POCT urinalysis dipstick, COMPLETE METABOLIC PANEL WITH GFR, CBC with Differential/Platelet, CANCELED: CBC with Differential/Platelet  Hyperlipidemia - Plan: Lipid panel, rosuvastatin (CRESTOR) 10 MG tablet     Signed, Robyn Haber, MD 12/10/2014 3:01 PM

## 2015-03-30 ENCOUNTER — Ambulatory Visit (INDEPENDENT_AMBULATORY_CARE_PROVIDER_SITE_OTHER): Payer: Medicare Other | Admitting: Physician Assistant

## 2015-03-30 VITALS — BP 110/68 | HR 68 | Temp 98.3°F | Resp 16 | Ht 64.5 in | Wt 141.4 lb

## 2015-03-30 DIAGNOSIS — N401 Enlarged prostate with lower urinary tract symptoms: Secondary | ICD-10-CM | POA: Diagnosis not present

## 2015-03-30 DIAGNOSIS — B309 Viral conjunctivitis, unspecified: Secondary | ICD-10-CM | POA: Diagnosis not present

## 2015-03-30 MED ORDER — OLOPATADINE HCL 0.2 % OP SOLN: 1.0000 [drp] | Freq: Every day | OPHTHALMIC | Status: DC

## 2015-03-30 NOTE — Progress Notes (Signed)
  Medical screening examination/treatment/procedure(s) were performed by non-physician practitioner and as supervising physician I was immediately available for consultation/collaboration.     

## 2015-03-30 NOTE — Progress Notes (Signed)
   Subjective:    Patient ID: Alan Mendez, male    DOB: 05-Dec-1940, 74 y.o.   MRN: [id number]  HPI Patient presents for watery red eyes. Initially noticed right side, but woke up and noticed that left eye was also affected. Eyes are itchy, but not painful. Denies photophobia and vision changes. Does not wear contacts. Eyes were not matted closed and no mucus noticed. Additionally endorses cough and congestion. Med allergy to lamisil.    Review of Systems  Constitutional: Negative.  Negative for fever.  HENT: Positive for congestion and rhinorrhea. Negative for ear pain, sinus pressure, sneezing and sore throat.   Eyes: Positive for discharge, redness and itching. Negative for photophobia, pain and visual disturbance.  Respiratory: Positive for cough.   Neurological: Negative for headaches.       Objective:   Physical Exam  Constitutional: He is oriented to person, place, and time. He appears well-developed and well-nourished. No distress.  Blood pressure 110/68, pulse 68, temperature 98.3 F (36.8 C), temperature source Oral, resp. rate 16, height 5' 4.5" (1.638 m), weight 141 lb 6.4 oz (64.139 kg), SpO2 97 %.  HENT:  Head: Normocephalic and atraumatic.  Right Ear: External ear normal.  Left Ear: External ear normal.  Mouth/Throat: Oropharynx is clear and moist. No oropharyngeal exudate.  Eyes: EOM are normal. Pupils are equal, round, and reactive to light. Right eye exhibits discharge (watery). Left eye exhibits discharge (watery). Right conjunctiva is injected (mild). Right conjunctiva has no hemorrhage. Left conjunctiva is injected (moderate). Left conjunctiva has no hemorrhage. No scleral icterus.  Neck: Neck supple. No thyromegaly present.  Cardiovascular: Normal rate, regular rhythm and normal heart sounds.  Exam reveals no gallop and no friction rub.   No murmur heard. Pulmonary/Chest: Effort normal and breath sounds normal. No respiratory distress. He has no wheezes. He has no  rales. He exhibits no tenderness.  Lymphadenopathy:    He has no cervical adenopathy.  Neurological: He is alert and oriented to person, place, and time.  Skin: Skin is warm. No rash noted. He is not diaphoretic. No erythema. No pallor.      Assessment & Plan:  1. Viral conjunctivitis - Olopatadine HCl 0.2 % SOLN; Apply 1 drop to eye daily.  Dispense: 1 Bottle; Refill: 0   Aubriegh Minch PA-C  [facility] 03/30/2015 10:25 AM

## 2015-03-30 NOTE — Patient Instructions (Signed)
Viral Conjunctivitis Conjunctivitis is an irritation (inflammation) of the clear membrane that covers the white part of the eye (the conjunctiva). The irritation can also happen on the underside of the eyelids. Conjunctivitis makes the eye red or pink in color. This is what is commonly known as pink eye. Viral conjunctivitis can spread easily (contagious). CAUSES   Infection from virus on the surface of the eye.  Infection from the irritation or injury of nearby tissues such as the eyelids or cornea.  More serious inflammation or infection on the inside of the eye.  Other eye diseases.  The use of certain eye medications. SYMPTOMS  The normally white color of the eye or the underside of the eyelid is usually pink or red in color. The pink eye is usually associated with irritation, tearing and some sensitivity to light. Viral conjunctivitis is often associated with a clear, watery discharge. If a discharge is present, there may also be some blurred vision in the affected eye. DIAGNOSIS  Conjunctivitis is diagnosed by an eye exam. The eye specialist looks for changes in the surface tissues of the eye which take on changes characteristic of the specific types of conjunctivitis. A sample of any discharge may be collected on a Q-Tip (sterile swap). The sample will be sent to a lab to see whether or not the inflammation is caused by bacterial or viral infection. TREATMENT  Viral conjunctivitis will not respond to medicines that kill germs (antibiotics). Treatment is aimed at stopping a bacterial infection on top of the viral infection. The goal of treatment is to relieve symptoms (such as itching) with antihistamine drops or other eye medications.  HOME CARE INSTRUCTIONS   To ease discomfort, apply a cool, clean wash cloth to your eye for 10 to 20 minutes, 3 to 4 times a day.  Gently wipe away any drainage from the eye with a warm, wet washcloth or a cotton ball.  Wash your hands often with soap  and use paper towels to dry.  Do not share towels or washcloths. This may spread the infection.  Change or wash your pillowcase every day.  You should not use eye make-up until the infection is gone.  Stop using contacts lenses. Ask your eye professional how to sterilize or replace them before using again. This depends on the type of contact lenses used.  Do not touch the edge of the eyelid with the eye drop bottle or ointment tube when applying medications to the affected eye. This will stop you from spreading the infection to the other eye or to others. SEEK IMMEDIATE MEDICAL CARE IF:   The infection has not improved within 3 days of beginning treatment.  A watery discharge from the eye develops.  Pain in the eye increases.  The redness is spreading.  Vision becomes blurred.  An oral temperature above 102 F (38.9 C) develops, or as your caregiver suggests.  Facial pain, redness or swelling develops.  Any problems that may be related to the prescribed medicine develop. MAKE SURE YOU:   Understand these instructions.  Will watch your condition.  Will get help right away if you are not doing well or get worse. Document Released: 10/30/2005 Document Revised: 01/22/2012 Document Reviewed: 06/18/2008 ExitCare Patient Information 2015 [employer]. This information is not intended to replace advice given to you by your health care provider. Make sure you discuss any questions you have with your health care provider.  

## 2015-04-06 DIAGNOSIS — N200 Calculus of kidney: Secondary | ICD-10-CM | POA: Diagnosis not present

## 2015-04-06 DIAGNOSIS — R350 Frequency of micturition: Secondary | ICD-10-CM | POA: Insufficient documentation

## 2015-04-06 DIAGNOSIS — R972 Elevated prostate specific antigen [PSA]: Secondary | ICD-10-CM | POA: Diagnosis not present

## 2015-04-06 DIAGNOSIS — N401 Enlarged prostate with lower urinary tract symptoms: Secondary | ICD-10-CM | POA: Diagnosis not present

## 2015-04-06 LAB — PSA: PSA: 4.64

## 2015-07-13 DIAGNOSIS — Z6823 Body mass index (BMI) 23.0-23.9, adult: Secondary | ICD-10-CM | POA: Diagnosis not present

## 2015-07-13 DIAGNOSIS — Z1389 Encounter for screening for other disorder: Secondary | ICD-10-CM | POA: Diagnosis not present

## 2015-07-13 DIAGNOSIS — Z23 Encounter for immunization: Secondary | ICD-10-CM | POA: Diagnosis not present

## 2015-07-13 DIAGNOSIS — M419 Scoliosis, unspecified: Secondary | ICD-10-CM | POA: Diagnosis not present

## 2015-07-13 DIAGNOSIS — E780 Pure hypercholesterolemia: Secondary | ICD-10-CM | POA: Diagnosis not present

## 2015-07-13 DIAGNOSIS — M179 Osteoarthritis of knee, unspecified: Secondary | ICD-10-CM | POA: Diagnosis not present

## 2015-07-13 DIAGNOSIS — N411 Chronic prostatitis: Secondary | ICD-10-CM | POA: Diagnosis not present

## 2015-07-13 DIAGNOSIS — Z87442 Personal history of urinary calculi: Secondary | ICD-10-CM | POA: Diagnosis not present

## 2015-07-29 ENCOUNTER — Encounter (HOSPITAL_COMMUNITY): Payer: Self-pay | Admitting: *Deleted

## 2015-07-29 DIAGNOSIS — R972 Elevated prostate specific antigen [PSA]: Secondary | ICD-10-CM

## 2015-07-29 DIAGNOSIS — N138 Other obstructive and reflux uropathy: Secondary | ICD-10-CM

## 2015-07-29 DIAGNOSIS — R350 Frequency of micturition: Secondary | ICD-10-CM

## 2015-07-29 DIAGNOSIS — N401 Enlarged prostate with lower urinary tract symptoms: Principal | ICD-10-CM

## 2015-07-29 DIAGNOSIS — N200 Calculus of kidney: Secondary | ICD-10-CM

## 2015-07-29 DIAGNOSIS — N281 Cyst of kidney, acquired: Secondary | ICD-10-CM

## 2015-07-29 NOTE — Assessment & Plan Note (Signed)
[facility] Carolan Clines, MD 04/06/15  Follow up in 1 year UA w/ reflex

## 2015-11-14 HISTORY — PX: CATARACT EXTRACTION W/ INTRAOCULAR LENS  IMPLANT, BILATERAL: SHX1307

## 2015-11-29 DIAGNOSIS — H25013 Cortical age-related cataract, bilateral: Secondary | ICD-10-CM | POA: Diagnosis not present

## 2015-11-29 DIAGNOSIS — H2513 Age-related nuclear cataract, bilateral: Secondary | ICD-10-CM | POA: Diagnosis not present

## 2015-12-22 DIAGNOSIS — R0602 Shortness of breath: Secondary | ICD-10-CM | POA: Diagnosis not present

## 2015-12-22 DIAGNOSIS — Z6824 Body mass index (BMI) 24.0-24.9, adult: Secondary | ICD-10-CM | POA: Diagnosis not present

## 2015-12-30 ENCOUNTER — Other Ambulatory Visit: Payer: Self-pay | Admitting: Family Medicine

## 2016-02-14 DIAGNOSIS — R972 Elevated prostate specific antigen [PSA]: Secondary | ICD-10-CM | POA: Diagnosis not present

## 2016-02-14 DIAGNOSIS — R8299 Other abnormal findings in urine: Secondary | ICD-10-CM | POA: Diagnosis not present

## 2016-02-14 DIAGNOSIS — E7800 Pure hypercholesterolemia, unspecified: Secondary | ICD-10-CM | POA: Diagnosis not present

## 2016-02-14 DIAGNOSIS — Z125 Encounter for screening for malignant neoplasm of prostate: Secondary | ICD-10-CM | POA: Diagnosis not present

## 2016-02-21 DIAGNOSIS — M179 Osteoarthritis of knee, unspecified: Secondary | ICD-10-CM | POA: Diagnosis not present

## 2016-02-21 DIAGNOSIS — Z0000 Encounter for general adult medical examination without abnormal findings: Secondary | ICD-10-CM | POA: Diagnosis not present

## 2016-02-21 DIAGNOSIS — Z6823 Body mass index (BMI) 23.0-23.9, adult: Secondary | ICD-10-CM | POA: Diagnosis not present

## 2016-02-21 DIAGNOSIS — N411 Chronic prostatitis: Secondary | ICD-10-CM | POA: Diagnosis not present

## 2016-02-21 DIAGNOSIS — L723 Sebaceous cyst: Secondary | ICD-10-CM | POA: Diagnosis not present

## 2016-02-21 DIAGNOSIS — M419 Scoliosis, unspecified: Secondary | ICD-10-CM | POA: Diagnosis not present

## 2016-02-21 DIAGNOSIS — R319 Hematuria, unspecified: Secondary | ICD-10-CM | POA: Diagnosis not present

## 2016-02-21 DIAGNOSIS — R001 Bradycardia, unspecified: Secondary | ICD-10-CM | POA: Diagnosis not present

## 2016-02-21 DIAGNOSIS — R972 Elevated prostate specific antigen [PSA]: Secondary | ICD-10-CM | POA: Diagnosis not present

## 2016-02-21 DIAGNOSIS — Z87442 Personal history of urinary calculi: Secondary | ICD-10-CM | POA: Diagnosis not present

## 2016-02-21 DIAGNOSIS — E7800 Pure hypercholesterolemia, unspecified: Secondary | ICD-10-CM | POA: Diagnosis not present

## 2016-02-23 DIAGNOSIS — H25012 Cortical age-related cataract, left eye: Secondary | ICD-10-CM | POA: Diagnosis not present

## 2016-03-03 DIAGNOSIS — Z1212 Encounter for screening for malignant neoplasm of rectum: Secondary | ICD-10-CM | POA: Diagnosis not present

## 2016-03-08 DIAGNOSIS — H2512 Age-related nuclear cataract, left eye: Secondary | ICD-10-CM | POA: Diagnosis not present

## 2016-03-08 DIAGNOSIS — H25012 Cortical age-related cataract, left eye: Secondary | ICD-10-CM | POA: Diagnosis not present

## 2016-03-08 DIAGNOSIS — H25011 Cortical age-related cataract, right eye: Secondary | ICD-10-CM | POA: Diagnosis not present

## 2016-03-15 DIAGNOSIS — H25011 Cortical age-related cataract, right eye: Secondary | ICD-10-CM | POA: Diagnosis not present

## 2016-03-15 DIAGNOSIS — H2511 Age-related nuclear cataract, right eye: Secondary | ICD-10-CM | POA: Diagnosis not present

## 2016-05-12 ENCOUNTER — Other Ambulatory Visit: Payer: Self-pay

## 2016-05-12 NOTE — Telephone Encounter (Signed)
RF Request rosuvastatin '10mg'$  -PT NEEDS FOLLOW UP LABS PRIOR TO REFILL Faxed to [facility] with above note.

## 2016-05-23 ENCOUNTER — Other Ambulatory Visit: Payer: Self-pay | Admitting: Urgent Care

## 2016-06-23 DIAGNOSIS — N401 Enlarged prostate with lower urinary tract symptoms: Secondary | ICD-10-CM | POA: Diagnosis not present

## 2016-06-23 DIAGNOSIS — R350 Frequency of micturition: Secondary | ICD-10-CM | POA: Diagnosis not present

## 2016-06-23 DIAGNOSIS — R3129 Other microscopic hematuria: Secondary | ICD-10-CM | POA: Diagnosis not present

## 2016-06-23 DIAGNOSIS — N209 Urinary calculus, unspecified: Secondary | ICD-10-CM | POA: Diagnosis not present

## 2016-06-23 DIAGNOSIS — N200 Calculus of kidney: Secondary | ICD-10-CM | POA: Diagnosis not present

## 2016-06-28 DIAGNOSIS — N200 Calculus of kidney: Secondary | ICD-10-CM | POA: Diagnosis not present

## 2016-06-28 DIAGNOSIS — R3129 Other microscopic hematuria: Secondary | ICD-10-CM | POA: Diagnosis not present

## 2016-06-29 ENCOUNTER — Other Ambulatory Visit (HOSPITAL_COMMUNITY): Payer: Self-pay | Admitting: Urology

## 2016-06-29 DIAGNOSIS — R350 Frequency of micturition: Secondary | ICD-10-CM | POA: Diagnosis not present

## 2016-06-29 DIAGNOSIS — N202 Calculus of kidney with calculus of ureter: Secondary | ICD-10-CM | POA: Diagnosis not present

## 2016-06-29 DIAGNOSIS — D4111 Neoplasm of uncertain behavior of right renal pelvis: Secondary | ICD-10-CM | POA: Diagnosis not present

## 2016-06-29 DIAGNOSIS — N401 Enlarged prostate with lower urinary tract symptoms: Secondary | ICD-10-CM | POA: Diagnosis not present

## 2016-06-29 DIAGNOSIS — D3000 Benign neoplasm of unspecified kidney: Secondary | ICD-10-CM

## 2016-06-29 DIAGNOSIS — N210 Calculus in bladder: Secondary | ICD-10-CM | POA: Diagnosis not present

## 2016-06-29 DIAGNOSIS — R3121 Asymptomatic microscopic hematuria: Secondary | ICD-10-CM | POA: Diagnosis not present

## 2016-06-30 ENCOUNTER — Other Ambulatory Visit: Payer: Self-pay | Admitting: Urology

## 2016-07-05 ENCOUNTER — Ambulatory Visit (HOSPITAL_COMMUNITY)
Admission: RE | Admit: 2016-07-05 | Discharge: 2016-07-05 | Disposition: A | Discharge status: Home / Self Care | Payer: Medicare Other | Source: Ambulatory Visit | Attending: Urology | Admitting: Urology

## 2016-07-05 DIAGNOSIS — R310 Gross hematuria: Secondary | ICD-10-CM | POA: Diagnosis not present

## 2016-07-05 DIAGNOSIS — I700 Atherosclerosis of aorta: Secondary | ICD-10-CM | POA: Diagnosis not present

## 2016-07-05 DIAGNOSIS — D3000 Benign neoplasm of unspecified kidney: Secondary | ICD-10-CM | POA: Insufficient documentation

## 2016-07-05 DIAGNOSIS — K8020 Calculus of gallbladder without cholecystitis without obstruction: Secondary | ICD-10-CM | POA: Diagnosis not present

## 2016-07-05 DIAGNOSIS — Q453 Other congenital malformations of pancreas and pancreatic duct: Secondary | ICD-10-CM | POA: Insufficient documentation

## 2016-07-05 MED ORDER — GADOBENATE DIMEGLUMINE 529 MG/ML IV SOLN
15.0000 mL | Freq: Once | INTRAVENOUS | Status: AC | PRN
  Administered 2016-07-05: 13 mL via INTRAVENOUS

## 2016-08-07 DIAGNOSIS — Z961 Presence of intraocular lens: Secondary | ICD-10-CM | POA: Diagnosis not present

## 2016-08-15 ENCOUNTER — Encounter (HOSPITAL_BASED_OUTPATIENT_CLINIC_OR_DEPARTMENT_OTHER): Payer: Self-pay | Admitting: *Deleted

## 2016-08-16 ENCOUNTER — Encounter (HOSPITAL_BASED_OUTPATIENT_CLINIC_OR_DEPARTMENT_OTHER): Payer: Self-pay | Admitting: *Deleted

## 2016-08-16 NOTE — Progress Notes (Signed)
Pt instructed npo p mn 10/8 x crestor w sip of water.  To [facility] 10/9 @ 0715.  Needs hgb, ? ekg on arrival.

## 2016-08-21 ENCOUNTER — Encounter (HOSPITAL_BASED_OUTPATIENT_CLINIC_OR_DEPARTMENT_OTHER)
Admission: RE | Disposition: A | Discharge status: Home / Self Care | Payer: Self-pay | Source: Ambulatory Visit | Attending: Urology

## 2016-08-21 ENCOUNTER — Encounter (HOSPITAL_BASED_OUTPATIENT_CLINIC_OR_DEPARTMENT_OTHER): Payer: Self-pay | Admitting: Anesthesiology

## 2016-08-21 ENCOUNTER — Ambulatory Visit (HOSPITAL_BASED_OUTPATIENT_CLINIC_OR_DEPARTMENT_OTHER): Payer: Medicare Other | Admitting: Anesthesiology

## 2016-08-21 ENCOUNTER — Ambulatory Visit (HOSPITAL_BASED_OUTPATIENT_CLINIC_OR_DEPARTMENT_OTHER)
Admission: RE | Admit: 2016-08-21 | Discharge: 2016-08-21 | Disposition: A | Discharge status: Home / Self Care | Payer: Medicare Other | Source: Ambulatory Visit | Attending: Urology | Admitting: Urology

## 2016-08-21 DIAGNOSIS — Z7982 Long term (current) use of aspirin: Secondary | ICD-10-CM | POA: Insufficient documentation

## 2016-08-21 DIAGNOSIS — R1900 Intra-abdominal and pelvic swelling, mass and lump, unspecified site: Secondary | ICD-10-CM | POA: Diagnosis not present

## 2016-08-21 DIAGNOSIS — R3914 Feeling of incomplete bladder emptying: Secondary | ICD-10-CM | POA: Insufficient documentation

## 2016-08-21 DIAGNOSIS — N202 Calculus of kidney with calculus of ureter: Secondary | ICD-10-CM | POA: Diagnosis not present

## 2016-08-21 DIAGNOSIS — N281 Cyst of kidney, acquired: Secondary | ICD-10-CM | POA: Diagnosis not present

## 2016-08-21 DIAGNOSIS — Z87891 Personal history of nicotine dependence: Secondary | ICD-10-CM | POA: Diagnosis not present

## 2016-08-21 DIAGNOSIS — I700 Atherosclerosis of aorta: Secondary | ICD-10-CM | POA: Diagnosis not present

## 2016-08-21 DIAGNOSIS — E7800 Pure hypercholesterolemia, unspecified: Secondary | ICD-10-CM | POA: Diagnosis not present

## 2016-08-21 DIAGNOSIS — R3121 Asymptomatic microscopic hematuria: Secondary | ICD-10-CM | POA: Diagnosis not present

## 2016-08-21 DIAGNOSIS — R3915 Urgency of urination: Secondary | ICD-10-CM | POA: Insufficient documentation

## 2016-08-21 DIAGNOSIS — N2889 Other specified disorders of kidney and ureter: Secondary | ICD-10-CM | POA: Diagnosis not present

## 2016-08-21 DIAGNOSIS — N201 Calculus of ureter: Secondary | ICD-10-CM | POA: Diagnosis not present

## 2016-08-21 DIAGNOSIS — N401 Enlarged prostate with lower urinary tract symptoms: Secondary | ICD-10-CM | POA: Diagnosis not present

## 2016-08-21 DIAGNOSIS — R3912 Poor urinary stream: Secondary | ICD-10-CM | POA: Diagnosis not present

## 2016-08-21 DIAGNOSIS — R9389 Abnormal findings on diagnostic imaging of other specified body structures: Secondary | ICD-10-CM

## 2016-08-21 DIAGNOSIS — D4111 Neoplasm of uncertain behavior of right renal pelvis: Secondary | ICD-10-CM | POA: Diagnosis not present

## 2016-08-21 DIAGNOSIS — R319 Hematuria, unspecified: Secondary | ICD-10-CM | POA: Diagnosis present

## 2016-08-21 HISTORY — PX: CYSTOSCOPY WITH RETROGRADE PYELOGRAM, URETEROSCOPY AND STENT PLACEMENT: SHX5789

## 2016-08-21 HISTORY — DX: Benign prostatic hyperplasia without lower urinary tract symptoms: N40.0

## 2016-08-21 HISTORY — DX: Personal history of colonic polyps: Z86.010

## 2016-08-21 HISTORY — DX: Calculus of kidney: N20.0

## 2016-08-21 HISTORY — DX: Personal history of adenomatous and serrated colon polyps: Z86.0101

## 2016-08-21 HISTORY — DX: Cyst of kidney, acquired: N28.1

## 2016-08-21 HISTORY — DX: Personal history of urinary calculi: Z87.442

## 2016-08-21 HISTORY — DX: Elevated prostate specific antigen (PSA): R97.20

## 2016-08-21 LAB — POCT I-STAT, CHEM 8
BUN: 26 mg/dL — AB (ref 6–20)
CHLORIDE: 106 mmol/L (ref 101–111)
Calcium, Ion: 1.22 mmol/L (ref 1.15–1.40)
Creatinine, Ser: 0.8 mg/dL (ref 0.61–1.24)
Glucose, Bld: 94 mg/dL (ref 65–99)
HEMATOCRIT: 46 % (ref 39.0–52.0)
Hemoglobin: 15.6 g/dL (ref 13.0–17.0)
Potassium: 3.9 mmol/L (ref 3.5–5.1)
SODIUM: 143 mmol/L (ref 135–145)
TCO2: 25 mmol/L (ref 0–100)

## 2016-08-21 SURGERY — CYSTOURETEROSCOPY, WITH RETROGRADE PYELOGRAM AND STENT INSERTION
Anesthesia: General | Site: Ureter | Laterality: Right

## 2016-08-21 MED ORDER — PROMETHAZINE HCL 25 MG/ML IJ SOLN
6.2500 mg | INTRAMUSCULAR | Status: DC | PRN
  Filled 2016-08-21: qty 1

## 2016-08-21 MED ORDER — OXYCODONE HCL 5 MG PO TABS
5.0000 mg | ORAL_TABLET | Freq: Once | ORAL | Status: DC | PRN
  Filled 2016-08-21: qty 1

## 2016-08-21 MED ORDER — ACETAMINOPHEN 500 MG PO TABS
ORAL_TABLET | ORAL | Status: AC
  Filled 2016-08-21: qty 2

## 2016-08-21 MED ORDER — PHENAZOPYRIDINE HCL 200 MG PO TABS: 200.0000 mg | ORAL_TABLET | Freq: Three times a day (TID) | ORAL | 3 refills | Status: DC | PRN

## 2016-08-21 MED ORDER — TRAMADOL-ACETAMINOPHEN 37.5-325 MG PO TABS: 1.0000 | ORAL_TABLET | Freq: Four times a day (QID) | ORAL | 2 refills | Status: DC | PRN

## 2016-08-21 MED ORDER — DEXAMETHASONE SODIUM PHOSPHATE 10 MG/ML IJ SOLN
INTRAMUSCULAR | Status: DC | PRN
  Administered 2016-08-21: 10 mg via INTRAVENOUS

## 2016-08-21 MED ORDER — PROPOFOL 10 MG/ML IV BOLUS
INTRAVENOUS | Status: AC
  Filled 2016-08-21: qty 20

## 2016-08-21 MED ORDER — CEFAZOLIN SODIUM-DEXTROSE 2-4 GM/100ML-% IV SOLN
2.0000 g | INTRAVENOUS | Status: AC
  Administered 2016-08-21: 2 g via INTRAVENOUS
  Filled 2016-08-21: qty 100

## 2016-08-21 MED ORDER — LIDOCAINE HCL (CARDIAC) 20 MG/ML IV SOLN
INTRAVENOUS | Status: DC | PRN
  Administered 2016-08-21: 100 mg via INTRAVENOUS

## 2016-08-21 MED ORDER — TRIMETHOPRIM 100 MG PO TABS: 100.0000 mg | ORAL_TABLET | ORAL | 1 refills | Status: DC

## 2016-08-21 MED ORDER — KETOROLAC TROMETHAMINE 30 MG/ML IJ SOLN
INTRAMUSCULAR | Status: AC
  Filled 2016-08-21: qty 1

## 2016-08-21 MED ORDER — CEFAZOLIN SODIUM-DEXTROSE 2-4 GM/100ML-% IV SOLN
INTRAVENOUS | Status: AC
  Filled 2016-08-21: qty 100

## 2016-08-21 MED ORDER — FENTANYL CITRATE (PF) 100 MCG/2ML IJ SOLN
INTRAMUSCULAR | Status: AC
  Filled 2016-08-21: qty 2

## 2016-08-21 MED ORDER — LACTATED RINGERS IV SOLN
INTRAVENOUS | Status: DC
  Administered 2016-08-21 (×2): via INTRAVENOUS
  Filled 2016-08-21: qty 1000

## 2016-08-21 MED ORDER — ACETAMINOPHEN 500 MG PO TABS
1000.0000 mg | ORAL_TABLET | Freq: Four times a day (QID) | ORAL | Status: DC | PRN
  Administered 2016-08-21: 1000 mg via ORAL
  Filled 2016-08-21: qty 2

## 2016-08-21 MED ORDER — PROPOFOL 10 MG/ML IV BOLUS
INTRAVENOUS | Status: DC | PRN
  Administered 2016-08-21: 200 mg via INTRAVENOUS

## 2016-08-21 MED ORDER — STERILE WATER FOR IRRIGATION IR SOLN
Status: DC | PRN
  Administered 2016-08-21: 500 mL

## 2016-08-21 MED ORDER — LIDOCAINE HCL 2 % EX GEL
CUTANEOUS | Status: DC | PRN
  Administered 2016-08-21: 1

## 2016-08-21 MED ORDER — DEXAMETHASONE SODIUM PHOSPHATE 10 MG/ML IJ SOLN
INTRAMUSCULAR | Status: AC
  Filled 2016-08-21: qty 1

## 2016-08-21 MED ORDER — ONDANSETRON HCL 4 MG/2ML IJ SOLN
INTRAMUSCULAR | Status: DC | PRN
  Administered 2016-08-21: 4 mg via INTRAVENOUS

## 2016-08-21 MED ORDER — FENTANYL CITRATE (PF) 100 MCG/2ML IJ SOLN
INTRAMUSCULAR | Status: DC | PRN
  Administered 2016-08-21 (×6): 25 ug via INTRAVENOUS

## 2016-08-21 MED ORDER — IOHEXOL 300 MG/ML  SOLN
INTRAMUSCULAR | Status: DC | PRN
  Administered 2016-08-21: 70 mL

## 2016-08-21 MED ORDER — OXYCODONE HCL 5 MG/5ML PO SOLN
5.0000 mg | Freq: Once | ORAL | Status: DC | PRN
  Filled 2016-08-21: qty 5

## 2016-08-21 MED ORDER — LIDOCAINE 2% (20 MG/ML) 5 ML SYRINGE
INTRAMUSCULAR | Status: AC
  Filled 2016-08-21: qty 5

## 2016-08-21 MED ORDER — KETOROLAC TROMETHAMINE 30 MG/ML IJ SOLN
INTRAMUSCULAR | Status: DC | PRN
  Administered 2016-08-21: 30 mg via INTRAVENOUS

## 2016-08-21 MED ORDER — FENTANYL CITRATE (PF) 100 MCG/2ML IJ SOLN
25.0000 ug | INTRAMUSCULAR | Status: DC | PRN
  Filled 2016-08-21: qty 1

## 2016-08-21 MED ORDER — FINASTERIDE 5 MG PO TABS: 5.0000 mg | ORAL_TABLET | Freq: Every day | ORAL | 3 refills | Status: DC

## 2016-08-21 MED ORDER — OXYBUTYNIN CHLORIDE 5 MG PO TABS: ORAL_TABLET | ORAL | 3 refills | Status: DC

## 2016-08-21 MED ORDER — SODIUM CHLORIDE 0.9 % IR SOLN
Status: DC | PRN
  Administered 2016-08-21: 3000 mL
  Administered 2016-08-21: 4000 mL

## 2016-08-21 MED ORDER — ONDANSETRON HCL 4 MG/2ML IJ SOLN
INTRAMUSCULAR | Status: AC
  Filled 2016-08-21: qty 2

## 2016-08-21 SURGICAL SUPPLY — 34 items
BAG DRAIN URO-CYSTO SKYTR STRL (DRAIN) ×3 IMPLANT
BASKET LASER NITINOL 1.9FR (BASKET) IMPLANT
BASKET STNLS GEMINI 4WIRE 3FR (BASKET) IMPLANT
BASKET ZERO TIP NITINOL 2.4FR (BASKET) IMPLANT
BOOTIES KNEE HIGH SLOAN (MISCELLANEOUS) ×3 IMPLANT
CATH INTERMIT  6FR 70CM (CATHETERS) ×3 IMPLANT
CATH URET 5FR 28IN CONE TIP (BALLOONS)
CATH URET 5FR 28IN OPEN ENDED (CATHETERS) IMPLANT
CATH URET 5FR 70CM CONE TIP (BALLOONS) IMPLANT
CATH URET DUAL LUMEN 6-10FR 50 (CATHETERS) ×3 IMPLANT
CLOTH BEACON ORANGE TIMEOUT ST (SAFETY) ×3 IMPLANT
ELECT REM PT RETURN 9FT ADLT (ELECTROSURGICAL)
ELECTRODE REM PT RTRN 9FT ADLT (ELECTROSURGICAL) IMPLANT
GLOVE BIO SURGEON STRL SZ7.5 (GLOVE) ×3 IMPLANT
GOWN STRL REUS W/ TWL LRG LVL3 (GOWN DISPOSABLE) ×1 IMPLANT
GOWN STRL REUS W/ TWL XL LVL3 (GOWN DISPOSABLE) ×1 IMPLANT
GOWN STRL REUS W/TWL LRG LVL3 (GOWN DISPOSABLE) ×2
GOWN STRL REUS W/TWL XL LVL3 (GOWN DISPOSABLE) ×2
GUIDEWIRE 0.038 PTFE COATED (WIRE) IMPLANT
GUIDEWIRE ANG ZIPWIRE 038X150 (WIRE) ×3 IMPLANT
GUIDEWIRE STR DUAL SENSOR (WIRE) ×6 IMPLANT
IV NS 1000ML (IV SOLUTION) ×4
IV NS 1000ML BAXH (IV SOLUTION) ×2 IMPLANT
IV NS IRRIG 3000ML ARTHROMATIC (IV SOLUTION) ×6 IMPLANT
KIT BALLIN UROMAX 15FX10 (LABEL) IMPLANT
KIT BALLN UROMAX 15FX4 (MISCELLANEOUS) IMPLANT
KIT BALLN UROMAX 26 75X4 (MISCELLANEOUS)
KIT ROOM TURNOVER WOR (KITS) ×3 IMPLANT
MANIFOLD NEPTUNE II (INSTRUMENTS) ×3 IMPLANT
SET HIGH PRES BAL DIL (LABEL)
SHEATH ACCESS URETERAL 38CM (SHEATH) IMPLANT
STENT URET 6FRX24 CONTOUR (STENTS) ×3 IMPLANT
TUBE CONNECTING 12'X1/4 (SUCTIONS) ×1
TUBE CONNECTING 12X1/4 (SUCTIONS) ×2 IMPLANT

## 2016-08-21 NOTE — Anesthesia Preprocedure Evaluation (Signed)
Anesthesia Evaluation  Patient identified by MRN, date of birth, ID band Patient awake    Reviewed: Allergy & Precautions, NPO status , Patient's Chart, lab work & pertinent test results  Airway Mallampati: II  TM Distance: >3 FB Neck ROM: Full    Dental no notable dental hx.    Pulmonary neg pulmonary ROS, former smoker,    Pulmonary exam normal breath sounds clear to auscultation       Cardiovascular negative cardio ROS Normal cardiovascular exam Rhythm:Regular Rate:Normal     Neuro/Psych Anxiety negative neurological ROS     GI/Hepatic negative GI ROS, Neg liver ROS,   Endo/Other  negative endocrine ROS  Renal/GU negative Renal ROS  negative genitourinary   Musculoskeletal negative musculoskeletal ROS (+)   Abdominal   Peds negative pediatric ROS (+)  Hematology negative hematology ROS (+)   Anesthesia Other Findings   Reproductive/Obstetrics negative OB ROS                             Anesthesia Physical Anesthesia Plan  ASA: II  Anesthesia Plan: General   Post-op Pain Management:    Induction: Intravenous  Airway Management Planned: LMA  Additional Equipment:   Intra-op Plan:   Post-operative Plan: Extubation in OR  Informed Consent: I have reviewed the patients History and Physical, chart, labs and discussed the procedure including the risks, benefits and alternatives for the proposed anesthesia with the patient or authorized representative who has indicated his/her understanding and acceptance.   Dental advisory given  Plan Discussed with: CRNA and Surgeon  Anesthesia Plan Comments:         Anesthesia Quick Evaluation

## 2016-08-21 NOTE — Interval H&P Note (Signed)
History and Physical Interval Note:  08/21/2016 8:42 AM  Alan Mendez  has presented today for surgery, with the diagnosis of right lower ureteral stone  right renal pelvic mass  The various methods of treatment have been discussed with the patient and family. After consideration of risks, benefits and other options for treatment, the patient has consented to  Procedure(s): CYSTOSCOPY WITH RIGHT RETROGRADE PYELOGRAM, RIGHT URETEROSCOPY, PYELOSCOPY, AND BRUSH BIOPSY RIGHT RENAL PELVIS (Right) as a surgical intervention .  The patient's history has been reviewed, patient examined, no change in status, stable for surgery.  I have reviewed the patient's chart and labs.  Questions were answered to the patient's satisfaction.     [name] I Demetres Prochnow  Note: Pt's wife just diagnosed with Stage 4 metastatic ( ? )  Recurrent breast cancer. Dr. Gunnar Bulla Magrinat, Oncologist.

## 2016-08-21 NOTE — Anesthesia Procedure Notes (Signed)
Procedure Name: LMA Insertion Date/Time: 08/21/2016 8:57 AM Performed by: Justice Rocher Pre-anesthesia Checklist: Patient identified, Emergency Drugs available, Suction available and Patient being monitored Patient Re-evaluated:Patient Re-evaluated prior to inductionOxygen Delivery Method: Circle system utilized Preoxygenation: Pre-oxygenation with 100% oxygen Intubation Type: IV induction Ventilation: Mask ventilation without difficulty LMA: LMA inserted LMA Size: 4.0 Number of attempts: 1 Airway Equipment and Method: Bite block Placement Confirmation: positive ETCO2 Tube secured with: Tape Dental Injury: Teeth and Oropharynx as per pre-operative assessment

## 2016-08-21 NOTE — Transfer of Care (Signed)
Immediate Anesthesia Transfer of Care Note  Patient: Alan Mendez  Procedure(s) Performed: Procedure(s) (LRB): CYSTOSCOPY WITH RIGHT RETROGRADE PYELOGRAM, RIGHT FLEXIBLE AND RIGID URETEROSCOPY, INSERTION DOUBLE J STENT RIGHT (Right)  Patient Location: PACU  Anesthesia Type: General  Level of Consciousness: awake, sedated, patient cooperative and responds to stimulation  Airway & Oxygen Therapy: Patient Spontanous Breathing and Patient connected to face mask oxygen  Post-op Assessment: Report given to PACU RN, Post -op Vital signs reviewed and stable and Patient moving all extremities  Post vital signs: Reviewed and stable  Complications: No apparent anesthesia complications

## 2016-08-21 NOTE — H&P (Signed)
Office Visit Report     06/29/2016   --------------------------------------------------------------------------------   Alan Mendez  MRN: [id number]  PRIMARY CARE:  Robyn Haber, MD  DOB: 1940-11-22, 75 year old Male  REFERRING:  Duyen Beckom I. Gaynelle Arabian, [place]  PROVIDER:  Carolan Clines, M.D.    LOCATION:  [facility] [phone]   --------------------------------------------------------------------------------   CC: I have blood in my urine.  HPI: Alan Mendez is a 75 year-old male established patient who is here for blood in the urine.  He did see the blood in his urine. He first noticed the blood approximately 05/13/2016. He has not seen blood clots.   He does not have a burning sensation when he urinates. He is not currently having trouble urinating.   He is not having pain. He has not recently had unwanted weight loss.   His last U/S or CT Scan was 06/28/2016.     CC: BPH  HPI: The patient complains of lower urinary tract symptom(s) that include frequency, urgency, weak stream, intermittency, and sense of incomplete emptying. The patient states his most bothersome symptom(s) are the following: urgency. Patient is currently treated with no rx for his symptoms. His symptoms have been stable over the last year. The patient states if he were to spend the rest of his life with his current urinary condition, he would be mostly satisfied. He denies any other associated symptoms. He has previously tried no rx for his symptoms.     CC: I have kidney stones.  HPI: The problem is on the right side. This is not his first kidney stone. His first stone was 06/29/2004. He is not currently having flank pain, back pain, groin pain, nausea, vomiting, fever or chills. He has not caught a stone in his urine strainer since his symptoms began.   He has had eswl for treatment of his stones in the past.   ESWL   currently, 7m R lower ureteral asymptomatic stone, no  hydro. 3 cm from the vesicoureteral junction.   also, patient has bilateral renal calculi: 2 stones in the left kidney; 3 nonobstructing stones in the mid right kidney measuring 4 mm each       CC: I have a renal mass.  HPI: The problem is on the right side. His problem was diagnosed 06/20/2016. The diagnosis was made by Dr. TGaynelle Arabian His symptoms include blood in urine. Patient denies having flank pain, back pain, groin pain, nausea, vomiting, fever, and chills. He had the following x-rays done: CT Scan. He has not had kidney surgery.   He has not seen blood in his urine. He does have a good appetite. BOWEL HABITS: his bowels are moving normally. He is not having pain in new locations. He has not recently had unwanted weight loss.   microscopic hematuria evaluation showing bilateral nephrolithiasis, as well as right lower ureteral calculus, with additional abnormality of right renal pelvic possible mass, in patient with 16-pack-year history of tobacco use( 927 years).     AUA Symptom Score: Less than 20% of the time he has the sensation of not emptying his bladder completely when finished urinating. Less than 20% of the time he has to urinate again fewer than two hours after he has finished urinating. Less than 20% of the time he has to start and stop again several times when he urinates. Less than 20% of the time he finds it difficult to postpone urination. Less than 20% of the time he  has a weak urinary stream. Less than 20% of the time he has to push or strain to begin urination. He has to get up to urinate 1 time from the time he goes to bed until the time he gets up in the morning.   Calculated AUA Symptom Score: 7    ALLERGIES: No Allergies    MEDICATIONS: Aspirin 81 MG TABS Oral  Centrum Silver TABS Oral  Crestor 10 MG Oral Tablet Oral     GU PSH: Locm 300-'399Mg'$ /Ml Iodine,1Ml - 06/28/2016 Renal ESWL - 2009      [place] Notes: Appendectomy, Lithotripsy   NON-GU PSH: Appendectomy  - 2009    GU PMH: BPH w/LUTS - 06/23/2016, BPH w/LUTS, Benign prostatic hyperplasia with urinary obstruction - 04/06/2015 Urinary Calculus, Unspec - 06/23/2016 Elevated PSA, Elevated prostate specific antigen (PSA) - 04/06/2015 Kidney Stone, Nephrolithiasis - 04/06/2015 Urinary Frequency, Urinary frequency - 04/06/2015 Calculus Ureter, Calculus of left ureter - 2014 Dorsalgia, Unspec, Backache - 2014 Other microscopic hematuria, Microscopic hematuria - 2014 Personal Hx urinary calculi, Nephrolithiasis - 2014 Renal Cysts, Simple, Renal cyst, acquired - 2014    NON-GU PMH: Encounter for general adult medical examination without abnormal findings, Encounter for preventive health examination - 2015 Personal history of other endocrine, nutritional and metabolic disease, History of hypercholesterolemia - 2014 Personal history of other mental and behavioral disorders, History of depression - 2014    FAMILY HISTORY: Cancer - Father Father Deceased At Age4 ___ - [place] In [place] - Mother Mother Deceased At Age 15 from diabetic complicati - Runs In Family   SOCIAL HISTORY: Marital Status: Married Drinks 1 drink per week.  Drinks 4+ caffeinated drinks per day.     Notes: Caffeine Use, Previous History Of Smoking, Marital History - Currently Married, Alcohol Use, Occupation:   REVIEW OF SYSTEMS:    GU Review Male:   Patient reports frequent urination, get up at night to urinate, and stream starts and stops. Patient denies hard to postpone urination, burning/ pain with urination, leakage of urine, trouble starting your stream, have to strain to urinate , erection problems, and penile pain.  Gastrointestinal (Upper):   Patient denies nausea, vomiting, and indigestion/ heartburn.  Gastrointestinal (Lower):   Patient denies diarrhea and constipation.  Constitutional:   Patient denies fever, night sweats, weight loss, and fatigue.  Skin:   Patient denies skin rash/ lesion and itching.   Eyes:   Patient denies blurred vision and double vision.  Ears/ Nose/ Throat:   Patient denies sore throat and sinus problems.  Hematologic/Lymphatic:   Patient denies swollen glands and easy bruising.  Cardiovascular:   Patient denies leg swelling and chest pains.  Respiratory:   Patient denies cough and shortness of breath.  Endocrine:   Patient denies excessive thirst.  Musculoskeletal:   Patient denies back pain and joint pain.  Neurological:   Patient denies headaches and dizziness.  Psychologic:   Patient denies depression and anxiety.   VITAL SIGNS:      06/29/2016 08:25 AM  BP 148/66 mmHg  Pulse 43 /min  Temperature 97.8 F / 37 C   GU PHYSICAL EXAMINATION:    Anus and Perineum: No hemorrhoids. No anal stenosis. No rectal fissure, no anal fissure. No edema, no dimple, no perineal tenderness, no anal tenderness.  Scrotum: No lesions. No edema. No cysts. No warts.  Epididymides: Right: no spermatocele, no masses, no cysts, no tenderness, no induration, no enlargement. Left: no spermatocele, no masses, no cysts, no tenderness, no  induration, no enlargement.  Testes: No tenderness, no swelling, no enlargement left testes. No tenderness, no swelling, no enlargement right testes. Normal location left testes. Normal location right testes. No mass, no cyst, no varicocele, no hydrocele left testes. No mass, no cyst, no varicocele, no hydrocele right testes.  Urethral Meatus: Normal size. No lesion, no wart, no discharge, no polyp. Normal location.  Penis: Circumcised, no warts, no cracks. No dorsal Peyronie's plaques, no left corporal Peyronie's plaques, no right corporal Peyronie's plaques, no scarring, no warts. No balanitis, no meatal stenosis.  Prostate: 40 gram or 2+ size. Left lobe normal consistency, right lobe normal consistency. Symmetrical lobes. No prostate nodule. Left lobe no tenderness, right lobe no tenderness.  Seminal Vesicles: Nonpalpable.  Sphincter Tone: Normal sphincter.  No rectal tenderness. No rectal mass.    MULTI-SYSTEM PHYSICAL EXAMINATION:    Constitutional: Well-nourished. No physical deformities. Normally developed. Good grooming.  Neck: Neck symmetrical, not swollen. Normal tracheal position.  Respiratory: No labored breathing, no use of accessory muscles.   Cardiovascular: Normal temperature, normal extremity pulses, no swelling, no varicosities.  Lymphatic: No enlargement of neck, axillae, groin.  Skin: No paleness, no jaundice, no cyanosis. No lesion, no ulcer, no rash.  Neurologic / Psychiatric: Oriented to time, oriented to place, oriented to person. No depression, no anxiety, no agitation.  Gastrointestinal: No mass, no tenderness, no rigidity, non obese abdomen.  Eyes: Normal conjunctivae. Normal eyelids.  Ears, Nose, Mouth, and Throat: Left ear no scars, no lesions, no masses. Right ear no scars, no lesions, no masses. Nose no scars, no lesions, no masses. Normal hearing. Normal lips.  Musculoskeletal: Normal gait and station of head and neck.     PAST DATA REVIEWED:  Source Of History:  Patient  Lab Test Review:   PSA, Free PSA  Records Review:   AUA Symptom Score, Previous Patient Records  Urine Test Review:   Urinalysis  X-Ray Review: C.T. Hematuria: Reviewed Films. Reviewed Report. Discussed With Patient.     06/23/16 03/31/15 02/25/14 12/31/12 12/20/11 06/21/11 12/21/10 06/22/10  PSA  Total PSA 4.00 ng/dl 4.64  5.03  4.72  5.62  5.20  5.90  5.51   Free PSA  1.11  1.03  1.17  1.04  1.19  1.2  1.03   % Free PSA  '24  20  25  19  23  20  '$ 18.7     06/29/16 06/23/16 04/06/15 03/03/14 01/28/13 04/15/12 03/28/12 01/03/12  Urinalysis  Urine Appearance Clear          Urine Specimen Voided          Urine Glucose Neg  Neg  NEG  NEG  NEG  NEG  NEG  NEG   Urine Bilirubin Neg  Neg  NEG  NEG  NEG  NEG  NEG  NEG   Urine Ketones Neg  Neg  NEG  NEG  TRACE  NEG  TRACE  NEG   Urine Specific Gravity 1.025  1.020  1.020  1.025  1.025  1.020  1.030   1.025   Urine Blood 1+  3+  NEG  NEG  NEG  LARGE  LARGE  NEG   Urine pH  5.0  6.0  6.0  6.0  7.0  6.0  6.0   Urine Protein Trace  Trace  NEG  NEG  NEG  NEG  30  NEG   Urine Urobilinogen 0.2          Urine Leukocyte Esterase Neg  Urine WBC/hpf 0-5/hpf          Urine RBC/hpf 3-10/hpf          Urine Epithelial Cells 0-5/hpf          Urine Bacteria Rare          Urine Mucous Not Present          Urine Yeast NS (Not Seen)          Urine Trichomonas Not Present          Urine Cystals NS (Not Seen)          Urine Casts NS (Not Seen)          Urine Sperm Not Present           PROCEDURES:         Flexible Cystoscopy - 52000  Risks, benefits, and some of the potential complications of the procedure were discussed at length with the patient including infection, bleeding, voiding discomfort, urinary retention, fever, chills, sepsis, and others. All questions were answered. Informed consent was obtained. Antibiotic prophylaxis was given. Sterile technique and intraurethral analgesia were used. multiple tiny stones (1 mm each) found within the bladder. Patient passed stones when voiding post cystoscopy.  Meatus:  Normal size. Normal location. Normal condition.  Urethra:  No strictures.  External Sphincter:  Normal.  Verumontanum:  Normal.  Prostate:  Non-obstructing. No hyperplasia.  Bladder Neck:  Non-obstructing.  Ureteral Orifices:  Normal location. Normal size. Normal shape. Effluxed clear urine.  Bladder:  No trabeculation. No tumors. Normal mucosa. No stones.      The lower urinary tract was carefully examined. The procedure was well-tolerated and without complications. Antibiotic instructions were given. Instructions were given to call the office immediately for bloody urine, difficulty urinating, urinary retention, painful or frequent urination, fever, chills, nausea, vomiting or other illness. The patient stated that he understood these instructions and would comply with them.          Urinalysis w/Scope - 81001 Dipstick Dipstick Cont'd Micro  Specimen: Voided Bilirubin: Neg WBC/hpf: 0-5/hpf  Color: Yellow Ketones: Neg RBC/hpf: 3-10/hpf  Appearance: Clear Blood: 1+ Bacteria: Rare  Specific Gravity: 1.025 Protein: Trace Cystals: NS (Not Seen)  pH: 6.0 Urobilinogen: 0.2 Casts: NS (Not Seen)  Glucose: Neg Nitrites: Neg Trichomonas: Not Present    Leukocyte Esterase: Neg Mucous: Not Present      Epithelial Cells: 0-5/hpf      Yeast: NS (Not Seen)      Sperm: Not Present    ASSESSMENT:      ICD-10 Details  1 GU:   Asymptomatic microscopic hematuria - R31.21   2   BPH w/LUTS - N40.1   3   Calculus Ureter - N20.1   4   Bladder Stone - N21.0   5   Kidney Stone - N20.0 Stable  6   Renal Pelvis, Right, Neoplasm of uncertain behavior - Z16.96           Notes:   the patient is a 75 year old male, seen today for follow-up of microscopic hematuria. He is a psych was, in excellent health (H-0. He has a remote tobacco exposure history of 16-pack-year smoking, but none 27 years). He has had kidney stones in the past, post lithotripsy in 2005. He has BPH, with mild symptoms, it is untreated. He has a history of PSA elevation, post negative biopsy.   Currently, he has microscopic hematuria, and CT hematuria protocol shows multiple abnormalities:   1. 5 mm  right lower ureteral calculus, 3 cm from the right U the junction, asymptomatic   2. Bilateral renal calculi: 3 nonobstructing stones in the right mid kidney measuring 4 mm each; and 2 calculi in the left mid kidney.   3: Benign 6.6 cm right upper pole non-enhancing cyst.   4. Right renal pelvic mass measuring 5 mm, with thickness, sent elevated field units. This may represent a renal pelvic tumor, and will be evaluated with MRI, and eventual ureteroscopy and brush biopsy.   The patient will be given tamsulosin to help pass his right lower ureteral calculus, and will be scheduled for ureteroscopy, possible removal of the right  ureteral stone, and ureteroscopy, and pyeloscopy. He will have urine cytology today, although would probably be influenced by his right lower ureteral calculus.    PLAN:            Medications New Meds: Tamsulosin Hcl 0.4 mg capsule, ext release 24 hr 1 capsule PO Daily   #30  5 Refill(s)            Orders Labs Urine Cytology  Lab Notes: 16 pk/yr tobacco use Right renal pelvic mass.           Schedule X-Rays: 1 Week - MRI Kidney With and Without I.V. Contrast  Return Visit: 07/30/2016 - Schedule Surgery, Follow up MD          Document Letter(s):  Created for Patient: Clinical Summary  EXAM: MRI ABDOMEN WITHOUT AND WITH CONTRAST  TECHNIQUE: Multiplanar multisequence MR imaging of the abdomen was performed both before and after the administration of intravenous contrast.  CONTRAST:  72m MULTIHANCE GADOBENATE DIMEGLUMINE 529 MG/ML IV SOLN  COMPARISON:  06/28/2016 CT abdomen/pelvis.  FINDINGS: Lower chest: Clear lung bases.  Hepatobiliary: Normal liver size and configuration. No hepatic steatosis. There are scattered simple liver cysts measuring up to 2.3 cm in the far posterior inferior right liver lobe. There is a small subcapsular venovenous shunt in the far lateral left liver lobe. No suspicious liver masses. Nondistended gallbladder contains a 1.2 cm gallstone, with no gallbladder wall thickening or pericholecystic fluid. No biliary ductal dilatation. Common bile duct diameter 4 mm. No choledocholithiasis.  Pancreas: No pancreatic mass or duct dilation. There is pancreas divisum, with drainage of the normal caliber main pancreatic duct via accessory duct of Santor[place], with no evidence of communication between the common bile duct and main pancreatic duct.  Spleen: Normal size spleen. There are 2 adjacent subcentimeter nonenhancing simple cystic lesions in the subcapsular posterior spleen, consistent with benign lesions such as  lymphangiomas.  Adrenals/Urinary Tract: Normal adrenals. No left hydronephrosis. There is an enhancing irregular branching urothelial mass centered in the lower right renal pelvis extending into the right lower renal collecting system calices, measuring 2.5 x 2.0 x 2.3 cm in maximum dimensions (series 902/ image 54). There are associated dilated calices in the lower right renal collecting system. There are simple renal cysts in the upper kidneys bilaterally measuring 6.6 cm on the right and 1.3 cm on the left.  Stomach/Bowel: Grossly normal stomach. Visualized small and large bowel is normal caliber, with no bowel wall thickening.  Vascular/Lymphatic: Atherosclerotic nonaneurysmal abdominal aorta. Patent portal, splenic, hepatic and renal veins. No pathologically enlarged lymph nodes in the abdomen.  Other: No abdominal ascites or focal fluid collection.  Musculoskeletal: No aggressive appearing focal osseous lesions. There is long segment levocurvature of the thoracolumbar spine.  IMPRESSION: 1. 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 2. No evidence of metastatic disease in the abdomen. 3. Additional findings include cholelithiasis, aortic atherosclerosis and pancreas divisum.   Electronically Signed   By: Ilona Sorrel M.D.   On: 07/05/2016 09:17      The information contained in this medical record document is considered private and confidential patient information. This information can only be used for the medical diagnosis and/or medical services that are being provided by the patient's selected caregivers. This information can only be distributed outside of the patient's care if the patient agrees and signs waivers of authorization for this information to be sent to an outside source or route.

## 2016-08-21 NOTE — Op Note (Signed)
Pre-operative diagnosis :  Right lower pole and renal pelvic mass  Postoperative diagnosis: Same  Operation: Cystourethroscopy, right retrograde pyelogram with interpretation, right ureteroscopy, with attempted pyeloscopy, right double-J stent (6 [place] by 24 cm  Surgeon:  S. Gaynelle Arabian, MD  First assistant:  None  Anesthesia: Gen. LMA  Preparation: After appropriate preanesthesia, the patient was brought the operating, placed preoperative table in the dorsal supine position where general LMA anesthesia was introduced. He was replaced in the dorsal lithotomy position with the pubis was prepped with Betadine solution and draped in usual fashion. The history was reviewed. The MRI scan was reviewed. The arm band was double checked. The right hand was previously marked.  Review history:  HPI: Alan Mendez is a 75 year-old male established patient who is here for blood in the urine.  He did see the blood in his urine. He first noticed the blood approximately 05/13/2016. He has not seen blood clots.   He does not have a burning sensation when he urinates. He is not currently having trouble urinating.   He is not having pain. He has not recently had unwanted weight loss.   His last U/S or CT Scan was 06/28/2016.   Statement of  Likelihood of Success: Excellent. TIME-OUT observed.:  Procedure:  Cystourethroscopy was accomplished, after several meatal stenosis was dilated. Cystoscopy revealed bilobar BPH, with mild elevation of the bladder neck. Trabeculation was identified within the bladder, but there was no evidence of cellules. There was no bladder stone or bladder tumor identified. There was no bladder diverticular formation. The BPH made cystoscopy difficult, however. Trigone was identified, and right retrograde pyelograms accomplished. The right ureter was quite small, but no hydronephrosis was noted. The renal pelvis appeared full, but no obvious tumor was identified within the renal  pelvis. The MRI scan was up on the screen for consistent comparison.  Two separate 0 .038 guidewires were passed into the renal pelvis and coiled. The double bridge ureteroscope was passed into the right ureteral orifice, and was passed as far as I could pass it into the right ureter. However, it would not pass above the pelvic brim, because of very tight ureter. I did not want to force the ureteroscope, so therefore removed this ureteroscope, and passed the digital flexible ureteroscope over one of the wires. No definite tumor was identified, but I was not comfortable that the scope ever passed into the extrarenal pelvis formally. I did not see any tumor. The patient did have bleeding into the ureter, and the bladder. I felt that the wires were not in good position, and removed both wires, and replaced the wire with another 0.038 guidewire. I elected to stop the procedure, and passed a 6 [place] by 24 cm double-J stent, coiled in the renal pelvis, and in the bladder. I will wait one month, and repeat ureteroscopy. The patient may need ureteroscopy with the 4.5 French ureteroscope.  Following double-J stent placement, the patient was given IV Toradol, awakened and taken to recovery room in good condition. Xylocaine jelly was placed in the urethra.

## 2016-08-21 NOTE — Anesthesia Postprocedure Evaluation (Signed)
Anesthesia Post Note  Patient: Alan Mendez  Procedure(s) Performed: Procedure(s) (LRB): CYSTOSCOPY WITH RIGHT RETROGRADE PYELOGRAM, RIGHT FLEXIBLE AND RIGID URETEROSCOPY, INSERTION DOUBLE J STENT RIGHT (Right)  Patient location during evaluation: PACU Anesthesia Type: General Level of consciousness: awake and alert Pain management: pain level controlled Vital Signs Assessment: post-procedure vital signs reviewed and stable Respiratory status: spontaneous breathing, nonlabored ventilation, respiratory function stable and patient connected to nasal cannula oxygen Cardiovascular status: blood pressure returned to baseline and stable Postop Assessment: no signs of nausea or vomiting Anesthetic complications: no    Last Vitals:  Vitals:   08/21/16 1021 08/21/16 1030  BP: (!) 145/70 140/84  Pulse: 65 62  Resp: (!) 28 15  Temp: 36.4 C     Last Pain:  Vitals:   08/21/16 1030  TempSrc:   PainSc: 0-No pain                 Ambrosia Wisnewski S

## 2016-08-21 NOTE — Discharge Instructions (Signed)

## 2016-08-22 ENCOUNTER — Encounter (HOSPITAL_BASED_OUTPATIENT_CLINIC_OR_DEPARTMENT_OTHER): Payer: Self-pay | Admitting: Urology

## 2016-09-03 DIAGNOSIS — Z23 Encounter for immunization: Secondary | ICD-10-CM | POA: Diagnosis not present

## 2016-09-04 DIAGNOSIS — D3000 Benign neoplasm of unspecified kidney: Secondary | ICD-10-CM | POA: Diagnosis not present

## 2016-09-05 ENCOUNTER — Other Ambulatory Visit: Payer: Self-pay | Admitting: Urology

## 2016-09-19 ENCOUNTER — Encounter (HOSPITAL_BASED_OUTPATIENT_CLINIC_OR_DEPARTMENT_OTHER): Payer: Self-pay | Admitting: *Deleted

## 2016-09-20 ENCOUNTER — Encounter (HOSPITAL_BASED_OUTPATIENT_CLINIC_OR_DEPARTMENT_OTHER): Payer: Self-pay | Admitting: *Deleted

## 2016-09-20 NOTE — Progress Notes (Signed)
Pt instructed no p mn 11/12 x pain med w sip of water if needed.  To [facility] 11/13 @ 0745.  Needs hgb on arrival

## 2016-09-25 ENCOUNTER — Ambulatory Visit (HOSPITAL_BASED_OUTPATIENT_CLINIC_OR_DEPARTMENT_OTHER): Payer: Medicare Other | Admitting: Anesthesiology

## 2016-09-25 ENCOUNTER — Encounter (HOSPITAL_BASED_OUTPATIENT_CLINIC_OR_DEPARTMENT_OTHER): Payer: Self-pay

## 2016-09-25 ENCOUNTER — Ambulatory Visit (HOSPITAL_BASED_OUTPATIENT_CLINIC_OR_DEPARTMENT_OTHER)
Admission: RE | Admit: 2016-09-25 | Discharge: 2016-09-25 | Disposition: A | Discharge status: Home / Self Care | Payer: Medicare Other | Source: Ambulatory Visit | Attending: Urology | Admitting: Urology

## 2016-09-25 ENCOUNTER — Encounter (HOSPITAL_BASED_OUTPATIENT_CLINIC_OR_DEPARTMENT_OTHER)
Admission: RE | Disposition: A | Discharge status: Home / Self Care | Payer: Self-pay | Source: Ambulatory Visit | Attending: Urology

## 2016-09-25 DIAGNOSIS — R3129 Other microscopic hematuria: Secondary | ICD-10-CM | POA: Diagnosis not present

## 2016-09-25 DIAGNOSIS — Z809 Family history of malignant neoplasm, unspecified: Secondary | ICD-10-CM | POA: Diagnosis not present

## 2016-09-25 DIAGNOSIS — Z466 Encounter for fitting and adjustment of urinary device: Secondary | ICD-10-CM | POA: Diagnosis not present

## 2016-09-25 DIAGNOSIS — Z888 Allergy status to other drugs, medicaments and biological substances status: Secondary | ICD-10-CM | POA: Diagnosis not present

## 2016-09-25 DIAGNOSIS — Z87891 Personal history of nicotine dependence: Secondary | ICD-10-CM | POA: Insufficient documentation

## 2016-09-25 DIAGNOSIS — N138 Other obstructive and reflux uropathy: Secondary | ICD-10-CM | POA: Diagnosis not present

## 2016-09-25 DIAGNOSIS — F329 Major depressive disorder, single episode, unspecified: Secondary | ICD-10-CM | POA: Diagnosis not present

## 2016-09-25 DIAGNOSIS — E7800 Pure hypercholesterolemia, unspecified: Secondary | ICD-10-CM | POA: Diagnosis not present

## 2016-09-25 DIAGNOSIS — F419 Anxiety disorder, unspecified: Secondary | ICD-10-CM | POA: Insufficient documentation

## 2016-09-25 DIAGNOSIS — Z8249 Family history of ischemic heart disease and other diseases of the circulatory system: Secondary | ICD-10-CM | POA: Diagnosis not present

## 2016-09-25 DIAGNOSIS — N401 Enlarged prostate with lower urinary tract symptoms: Secondary | ICD-10-CM | POA: Diagnosis not present

## 2016-09-25 DIAGNOSIS — C651 Malignant neoplasm of right renal pelvis: Secondary | ICD-10-CM

## 2016-09-25 DIAGNOSIS — Z833 Family history of diabetes mellitus: Secondary | ICD-10-CM | POA: Insufficient documentation

## 2016-09-25 DIAGNOSIS — Z7982 Long term (current) use of aspirin: Secondary | ICD-10-CM | POA: Insufficient documentation

## 2016-09-25 DIAGNOSIS — R310 Gross hematuria: Secondary | ICD-10-CM | POA: Diagnosis not present

## 2016-09-25 DIAGNOSIS — R898 Other abnormal findings in specimens from other organs, systems and tissues: Secondary | ICD-10-CM | POA: Diagnosis not present

## 2016-09-25 DIAGNOSIS — N2889 Other specified disorders of kidney and ureter: Secondary | ICD-10-CM | POA: Diagnosis not present

## 2016-09-25 HISTORY — PX: URETEROSCOPY: SHX842

## 2016-09-25 HISTORY — DX: Presence of spectacles and contact lenses: Z97.3

## 2016-09-25 HISTORY — PX: CYSTOSCOPY W/ RETROGRADES: SHX1426

## 2016-09-25 HISTORY — PX: CYSTOSCOPY W/ URETERAL STENT REMOVAL: SHX1430

## 2016-09-25 LAB — POCT HEMOGLOBIN-HEMACUE: Hemoglobin: 15.5 g/dL (ref 13.0–17.0)

## 2016-09-25 SURGERY — CYSTOSCOPY, WITH RETROGRADE PYELOGRAM
Anesthesia: General | Site: Ureter | Laterality: Right

## 2016-09-25 MED ORDER — MIDAZOLAM HCL 5 MG/5ML IJ SOLN
INTRAMUSCULAR | Status: DC | PRN
  Administered 2016-09-25: 2 mg via INTRAVENOUS

## 2016-09-25 MED ORDER — TRAMADOL-ACETAMINOPHEN 37.5-325 MG PO TABS: 1.0000 | ORAL_TABLET | Freq: Four times a day (QID) | ORAL | 0 refills | Status: DC | PRN

## 2016-09-25 MED ORDER — DEXAMETHASONE SODIUM PHOSPHATE 10 MG/ML IJ SOLN
INTRAMUSCULAR | Status: AC
  Filled 2016-09-25: qty 1

## 2016-09-25 MED ORDER — ACETAMINOPHEN 10 MG/ML IV SOLN
INTRAVENOUS | Status: DC | PRN
  Administered 2016-09-25: 1000 mg via INTRAVENOUS

## 2016-09-25 MED ORDER — PHENYLEPHRINE HCL 10 MG/ML IJ SOLN
INTRAMUSCULAR | Status: DC | PRN
  Administered 2016-09-25: 80 ug via INTRAVENOUS
  Administered 2016-09-25: 40 ug via INTRAVENOUS

## 2016-09-25 MED ORDER — PROPOFOL 10 MG/ML IV BOLUS
INTRAVENOUS | Status: AC
  Filled 2016-09-25: qty 20

## 2016-09-25 MED ORDER — BELLADONNA ALKALOIDS-OPIUM 16.2-60 MG RE SUPP
RECTAL | Status: DC | PRN
  Administered 2016-09-25: 1 via RECTAL

## 2016-09-25 MED ORDER — BELLADONNA ALKALOIDS-OPIUM 16.2-60 MG RE SUPP
RECTAL | Status: AC
  Filled 2016-09-25: qty 1

## 2016-09-25 MED ORDER — KETOROLAC TROMETHAMINE 30 MG/ML IJ SOLN
INTRAMUSCULAR | Status: AC
  Filled 2016-09-25: qty 1

## 2016-09-25 MED ORDER — ONDANSETRON HCL 4 MG/2ML IJ SOLN
INTRAMUSCULAR | Status: AC
  Filled 2016-09-25: qty 2

## 2016-09-25 MED ORDER — FENTANYL CITRATE (PF) 100 MCG/2ML IJ SOLN
25.0000 ug | INTRAMUSCULAR | Status: DC | PRN
  Filled 2016-09-25: qty 1

## 2016-09-25 MED ORDER — GLYCOPYRROLATE 0.2 MG/ML IV SOSY
PREFILLED_SYRINGE | INTRAVENOUS | Status: AC
  Filled 2016-09-25: qty 3

## 2016-09-25 MED ORDER — LACTATED RINGERS IV SOLN
INTRAVENOUS | Status: DC
  Administered 2016-09-25 (×2): via INTRAVENOUS
  Filled 2016-09-25: qty 1000

## 2016-09-25 MED ORDER — FENTANYL CITRATE (PF) 100 MCG/2ML IJ SOLN
INTRAMUSCULAR | Status: DC | PRN
  Administered 2016-09-25 (×3): 25 ug via INTRAVENOUS

## 2016-09-25 MED ORDER — IOHEXOL 300 MG/ML  SOLN
INTRAMUSCULAR | Status: DC | PRN
  Administered 2016-09-25: 7 mL

## 2016-09-25 MED ORDER — PHENYLEPHRINE 40 MCG/ML (10ML) SYRINGE FOR IV PUSH (FOR BLOOD PRESSURE SUPPORT)
PREFILLED_SYRINGE | INTRAVENOUS | Status: AC
  Filled 2016-09-25: qty 10

## 2016-09-25 MED ORDER — MIDAZOLAM HCL 2 MG/2ML IJ SOLN
INTRAMUSCULAR | Status: AC
  Filled 2016-09-25: qty 2

## 2016-09-25 MED ORDER — FENTANYL CITRATE (PF) 100 MCG/2ML IJ SOLN
INTRAMUSCULAR | Status: AC
  Filled 2016-09-25: qty 2

## 2016-09-25 MED ORDER — DEXAMETHASONE SODIUM PHOSPHATE 4 MG/ML IJ SOLN
INTRAMUSCULAR | Status: DC | PRN
  Administered 2016-09-25: 10 mg via INTRAVENOUS

## 2016-09-25 MED ORDER — ONDANSETRON HCL 4 MG/2ML IJ SOLN
INTRAMUSCULAR | Status: DC | PRN
  Administered 2016-09-25: 4 mg via INTRAVENOUS

## 2016-09-25 MED ORDER — PROPOFOL 10 MG/ML IV BOLUS
INTRAVENOUS | Status: DC | PRN
Start: 2016-09-25 — End: 2016-09-25
  Administered 2016-09-25: 150 mg via INTRAVENOUS
  Administered 2016-09-25: 30 mg via INTRAVENOUS

## 2016-09-25 MED ORDER — SODIUM CHLORIDE 0.9 % IR SOLN
Status: DC | PRN
  Administered 2016-09-25: 4000 mL

## 2016-09-25 MED ORDER — LIDOCAINE 2% (20 MG/ML) 5 ML SYRINGE
INTRAMUSCULAR | Status: DC | PRN
  Administered 2016-09-25: 60 mg via INTRAVENOUS

## 2016-09-25 MED ORDER — CEFAZOLIN SODIUM-DEXTROSE 2-4 GM/100ML-% IV SOLN
2.0000 g | INTRAVENOUS | Status: AC
  Administered 2016-09-25: 2 g via INTRAVENOUS
  Filled 2016-09-25: qty 100

## 2016-09-25 MED ORDER — EPHEDRINE 5 MG/ML INJ
INTRAVENOUS | Status: AC
  Filled 2016-09-25: qty 10

## 2016-09-25 MED ORDER — CEFAZOLIN SODIUM-DEXTROSE 2-4 GM/100ML-% IV SOLN
INTRAVENOUS | Status: AC
  Filled 2016-09-25: qty 100

## 2016-09-25 MED ORDER — KETOROLAC TROMETHAMINE 30 MG/ML IJ SOLN
INTRAMUSCULAR | Status: DC | PRN
  Administered 2016-09-25: 30 mg via INTRAVENOUS

## 2016-09-25 MED ORDER — TAMSULOSIN HCL 0.4 MG PO CAPS: 0.4000 mg | ORAL_CAPSULE | Freq: Every day | ORAL | 5 refills | Status: AC

## 2016-09-25 MED ORDER — LIDOCAINE 2% (20 MG/ML) 5 ML SYRINGE
INTRAMUSCULAR | Status: AC
  Filled 2016-09-25: qty 5

## 2016-09-25 SURGICAL SUPPLY — 40 items
BAG DRAIN URO-CYSTO SKYTR STRL (DRAIN) ×4 IMPLANT
BASKET LASER NITINOL 1.9FR (BASKET) IMPLANT
BASKET STNLS GEMINI 4WIRE 3FR (BASKET) IMPLANT
BASKET ZERO TIP NITINOL 2.4FR (BASKET) IMPLANT
BOOTIES KNEE HIGH SLOAN (MISCELLANEOUS) ×4 IMPLANT
BRUSH URET BIOPSY 3F (UROLOGICAL SUPPLIES) ×8 IMPLANT
CATH CLEAR GEL 3F BACKSTOP (CATHETERS) IMPLANT
CATH INTERMIT  6FR 70CM (CATHETERS) ×4 IMPLANT
CATH URET 5FR 28IN CONE TIP (BALLOONS)
CATH URET 5FR 28IN OPEN ENDED (CATHETERS) IMPLANT
CATH URET 5FR 70CM CONE TIP (BALLOONS) IMPLANT
CATH URET DUAL LUMEN 6-10FR 50 (CATHETERS) IMPLANT
CLOTH BEACON ORANGE TIMEOUT ST (SAFETY) ×4 IMPLANT
ELECT REM PT RETURN 9FT ADLT (ELECTROSURGICAL)
ELECTRODE REM PT RTRN 9FT ADLT (ELECTROSURGICAL) IMPLANT
GLOVE BIO SURGEON STRL SZ7.5 (GLOVE) ×4 IMPLANT
GLOVE BIOGEL PI IND STRL 7.0 (GLOVE) ×4 IMPLANT
GLOVE BIOGEL PI INDICATOR 7.0 (GLOVE) ×4
GLOVE ECLIPSE 7.0 STRL STRAW (GLOVE) ×4 IMPLANT
GOWN STRL REUS W/ TWL LRG LVL3 (GOWN DISPOSABLE) ×2 IMPLANT
GOWN STRL REUS W/ TWL XL LVL3 (GOWN DISPOSABLE) ×2 IMPLANT
GOWN STRL REUS W/TWL LRG LVL3 (GOWN DISPOSABLE) ×2
GOWN STRL REUS W/TWL XL LVL3 (GOWN DISPOSABLE) ×2
GUIDEWIRE 0.038 PTFE COATED (WIRE) IMPLANT
GUIDEWIRE ANG ZIPWIRE 038X150 (WIRE) IMPLANT
GUIDEWIRE STR DUAL SENSOR (WIRE) ×8 IMPLANT
IV NS 1000ML (IV SOLUTION) ×2
IV NS 1000ML BAXH (IV SOLUTION) ×2 IMPLANT
IV NS IRRIG 3000ML ARTHROMATIC (IV SOLUTION) ×4 IMPLANT
KIT BALLIN UROMAX 15FX10 (LABEL) IMPLANT
KIT BALLN UROMAX 15FX4 (MISCELLANEOUS) IMPLANT
KIT BALLN UROMAX 26 75X4 (MISCELLANEOUS)
KIT ROOM TURNOVER WOR (KITS) ×4 IMPLANT
MANIFOLD NEPTUNE II (INSTRUMENTS) ×4 IMPLANT
NS IRRIG 500ML POUR BTL (IV SOLUTION) ×4 IMPLANT
PACK CYSTO (CUSTOM PROCEDURE TRAY) ×4 IMPLANT
SET HIGH PRES BAL DIL (LABEL)
SHEATH ACCESS URETERAL 38CM (SHEATH) ×4 IMPLANT
TUBE CONNECTING 12'X1/4 (SUCTIONS)
TUBE CONNECTING 12X1/4 (SUCTIONS) IMPLANT

## 2016-09-25 NOTE — H&P (Signed)
Office Visit Report     09/04/2016   --------------------------------------------------------------------------------   Alan Mendez  MRN: [id number]  PRIMARY CARE:  Robyn Haber, MD  DOB: 1941/10/13, 75 year old Male  REFERRING:  Raydan Schlabach I. Gaynelle Arabian, MD  SSN: [id number]  PROVIDER:  Carolan Clines, M.D.    LOCATION:  [facility] [phone]   --------------------------------------------------------------------------------   CC: I have a renal mass.  HPI: Alan Mendez is a 75 year-old male established patient who is here for a renal mass.  The problem is on the right side. His problem was diagnosed 06/20/2016. The diagnosis was made by Dr. Gaynelle Arabian. His symptoms include blood in urine. Patient denies having flank pain, back pain, groin pain, nausea, vomiting, fever, and chills. He had the following x-rays done: CT Scan and MRI Scan.   He has not seen blood in his urine. He does have a good appetite. BOWEL HABITS: his bowels are moving normally. He is not having pain in new locations. He has not recently had unwanted weight loss.   Microscopic hematuria evaluation showing bilateral nephrolithiasis, as well as right lower ureteral calculus, with additional abnormality of right renal pelvic possible mass, in patient with 16-pack-year history of tobacco use (927 years).   MRI 07/05/16 showed enhancing irregular branching urothelial mass centered in lower right renal pelvis extending into right lower collecting system, consistent with upper tract urothelial malignancy.     CC: I am here for a post operative visit.  HPI: The surgery he had done was Cysto/Rt RPG with interpretation/Rt ureteroscopy with attempted pyeloscopy/Rt JJ stent. His surgery was done 08/21/2016.   He has not had incisional drainage. He has not had pain in his incision. He does not have swelling around his incision.   He has not had post operative nausea. He has not had vomiting. He has not had post  operative fever. He has not had post operative chills. He does have a good appetite. BOWEL HABITS: his bowels are moving normally.    Gross hematuria with exercise secondary to double-J stent.     ALLERGIES: No Allergies    MEDICATIONS: Tamsulosin Hcl 0.4 mg capsule, ext release 24 hr 1 capsule PO Daily  Aspirin 81 MG TABS Oral  Centrum Silver TABS Oral  Crestor 10 MG Oral Tablet Oral     GU PSH: Cystoscopy - 06/29/2016 Cystoscopy Insert Stent, Right - 08/21/2016 Cystoscopy Ureteroscopy, Right - 08/21/2016 Locm 300-'399Mg'$ /Ml Iodine,1Ml - 06/28/2016 Renal ESWL - 2009    NON-GU PSH: Appendectomy - 2009    GU PMH: Asymptomatic microscopic hematuria - 06/29/2016 Bladder Stone - 06/29/2016 BPH w/LUTS - 06/29/2016, - 06/23/2016, Benign prostatic hyperplasia with urinary obstruction, - 04/06/2015 Calculus Ureter - 06/29/2016, Calculus of left ureter, - 2014 Kidney Stone (Stable) - 06/29/2016, Nephrolithiasis, - 04/06/2015 Renal Pelvis, Right, Neoplasm of uncertain behavior - 11/18/2692 Urinary Calculus, Unspec - 06/23/2016 Elevated PSA, Elevated prostate specific antigen (PSA) - 04/06/2015 Urinary Frequency, Urinary frequency - 04/06/2015 Dorsalgia, Unspec, Backache - 2014 Other microscopic hematuria, Microscopic hematuria - 2014 Personal Hx urinary calculi, Nephrolithiasis - 2014 Renal Cysts, Simple, Renal cyst, acquired - 2014    NON-GU PMH: Encounter for general adult medical examination without abnormal findings, Encounter for preventive health examination - 2015 Personal history of other endocrine, nutritional and metabolic disease, History of hypercholesterolemia - 2014 Personal history of other mental and behavioral disorders, History of depression - 2014    FAMILY HISTORY: Cancer - Father Father Deceased At Age39 ___ - Runs In  Family Heart Disease - Mother Mother Deceased At Age 46 from diabetic complicati - Runs In Family   SOCIAL HISTORY: Marital Status: Married Drinks 1 drink  per week.  Drinks 4+ caffeinated drinks per day.    REVIEW OF SYSTEMS:    GU Review Male:   Patient reports frequent urination and burning/ pain with urination. Patient denies hard to postpone urination, get up at night to urinate, leakage of urine, stream starts and stops, trouble starting your stream, have to strain to urinate , erection problems, and penile pain.  Gastrointestinal (Upper):   Patient denies nausea, vomiting, and indigestion/ heartburn.  Gastrointestinal (Lower):   Patient denies diarrhea and constipation.  Constitutional:   Patient denies fever, night sweats, weight loss, and fatigue.  Skin:   Patient denies skin rash/ lesion and itching.  Eyes:   Patient denies blurred vision and double vision.  Ears/ Nose/ Throat:   Patient denies sore throat and sinus problems.  Hematologic/Lymphatic:   Patient denies swollen glands and easy bruising.  Cardiovascular:   Patient denies leg swelling and chest pains.  Respiratory:   Patient denies cough and shortness of breath.  Endocrine:   Patient denies excessive thirst.  Musculoskeletal:   Patient denies back pain and joint pain.  Neurological:   Patient denies headaches and dizziness.  Psychologic:   Patient denies depression and anxiety.   VITAL SIGNS:      09/04/2016 02:30 PM  BP 139/85 mmHg  Pulse 58 /min  Temperature 97.8 F / 37 C   GU PHYSICAL EXAMINATION:    Anus and Perineum: No hemorrhoids. No anal stenosis. No rectal fissure, no anal fissure. No edema, no dimple, no perineal tenderness, no anal tenderness.  Scrotum: No lesions. No edema. No cysts. No warts.  Epididymides: Right: no spermatocele, no masses, no cysts, no tenderness, no induration, no enlargement. Left: no spermatocele, no masses, no cysts, no tenderness, no induration, no enlargement.  Testes: No tenderness, no swelling, no enlargement left testes. No tenderness, no swelling, no enlargement right testes. Normal location left testes. Normal location right  testes. No mass, no cyst, no varicocele, no hydrocele left testes. No mass, no cyst, no varicocele, no hydrocele right testes.  Urethral Meatus: Normal size. No lesion, no wart, no discharge, no polyp. Normal location.  Penis: Circumcised, no warts, no cracks. No dorsal Peyronie's plaques, no left corporal Peyronie's plaques, no right corporal Peyronie's plaques, no scarring, no warts. No balanitis, no meatal stenosis.  Prostate: 40 gram or 2+ size. Left lobe normal consistency, right lobe normal consistency. Symmetrical lobes. No prostate nodule. Left lobe no tenderness, right lobe no tenderness.  Seminal Vesicles: Nonpalpable.  Sphincter Tone: Normal sphincter. No rectal tenderness. No rectal mass.      PAST DATA REVIEWED:  Source Of History:  Patient  Records Review:   Previous Patient Records   06/23/16 03/31/15 02/25/14 12/31/12 12/20/11 06/21/11 12/21/10 06/22/10  PSA  Total PSA 4.00 ng/dl 4.64  5.03  4.72  5.62  5.20  5.90  5.51   Free PSA  1.11  1.03  1.17  1.04  1.19  1.2  1.03   % Free PSA  '24  20  25  19  23  20  '$ 18.7     PROCEDURES:          Urinalysis w/Scope - 81001 Dipstick Dipstick Cont'd Micro  Specimen: Voided Bilirubin: Neg WBC/hpf: 40 - 60/hpf  Color: Amber Ketones: Neg RBC/hpf: >60/hpf  Appearance: Clear Blood: 3+ Bacteria: NS (  Not Seen)  Specific Gravity: 1.020 Protein: 3+ Cystals: Ca Oxalate  pH: 6.0 Urobilinogen: 0.2 Casts: NS (Not Seen)  Glucose: Neg Nitrites: Neg Trichomonas: Not Present    Leukocyte Esterase: 2+ Mucous: Not Present      Epithelial Cells: 0 - 5/hpf      Yeast: NS (Not Seen)      Sperm: Not Present    ASSESSMENT:      ICD-10 Details  1 GU:   Benign Neo Kidney, Unspec - D30.00   2 NON-GU:   Other specified postprocedural states - Z98.89               Notes:   Alan Mendez is a 75 year old male, with a history of gross painless hematuria. This began in July 2017. There was no infection. CT scan and MRI does show an abnormality in the right  lower pole, and the right renal pelvis, indicating possible TCC. He does have a 16-pack-year history of tobacco use, but none 27 years differences. He had an attempted right ureteroscopy on 1009, but I was unable to pass the ureteroscope into the renal pelvis, because of the small diameter of the right ureter. Double-J stent was left in place, and he will now be rescheduled for repeat ureteroscopy.(4.5 [place] scope).    PLAN:           Schedule Return Visit: Next Available Appointment - Schedule Surgery          Document Letter(s):  Created for Patient: Clinical Summary    Signed by Carolan Clines, M.D. on 09/04/16 at 6:00 PM (EDT)     The information contained in this medical record document is considered private and confidential patient information. This information can only be used for the medical diagnosis and/or medical services that are being provided by the patient's selected caregivers. This information can only be distributed outside of the patient's care if the patient agrees and signs waivers of authorization for this information to be sent to an outside source or route.

## 2016-09-25 NOTE — Interval H&P Note (Signed)
History and Physical Interval Note:  09/25/2016 9:32 AM  Haynes Kerns  has presented today for surgery, with the diagnosis of RIGHT RENAL MASS  The various methods of treatment have been discussed with the patient and family. After consideration of risks, benefits and other options for treatment, the patient has consented to  Procedure(s): CYSTOSCOPY WITH RETROGRADE PYELOGRAM, BRUSH BIOPSY OF RIGHT RENAL MASS (Right) RIGHT URETEROSCOPY (Right) as a surgical intervention .  The patient's history has been reviewed, patient examined, no change in status, stable for surgery.  I have reviewed the patient's chart and labs.  Questions were answered to the patient's satisfaction.     Brytnee Bechler I Deen Deguia Pt has had attempted Right retrograde/ureteroscopy, but had very small ureter, requiring right JJ stenting. Now for repeat right retrograde pyelogram, ureteroscopy with 4.57F semirigid scope and small flex ureteroscope.

## 2016-09-25 NOTE — Anesthesia Preprocedure Evaluation (Addendum)
Anesthesia Evaluation  Patient identified by MRN, date of birth, ID band Patient awake    Reviewed: Allergy & Precautions, NPO status , Patient's Chart, lab work & pertinent test results  Airway Mallampati: II  TM Distance: >3 FB Neck ROM: Full    Dental no notable dental hx. (+) Teeth Intact, Dental Advisory Given   Pulmonary neg pulmonary ROS, former smoker,    Pulmonary exam normal breath sounds clear to auscultation       Cardiovascular negative cardio ROS Normal cardiovascular exam Rhythm:Regular Rate:Normal     Neuro/Psych Anxiety negative neurological ROS     GI/Hepatic negative GI ROS, Neg liver ROS,   Endo/Other  negative endocrine ROS  Renal/GU negative Renal ROS  negative genitourinary   Musculoskeletal negative musculoskeletal ROS (+)   Abdominal   Peds negative pediatric ROS (+)  Hematology negative hematology ROS (+)   Anesthesia Other Findings   Reproductive/Obstetrics negative OB ROS                            Anesthesia Physical  Anesthesia Plan  ASA: II  Anesthesia Plan: General   Post-op Pain Management:    Induction: Intravenous  Airway Management Planned: LMA  Additional Equipment:   Intra-op Plan:   Post-operative Plan: Extubation in OR  Informed Consent: I have reviewed the patients History and Physical, chart, labs and discussed the procedure including the risks, benefits and alternatives for the proposed anesthesia with the patient or authorized representative who has indicated his/her understanding and acceptance.   Dental advisory given  Plan Discussed with: CRNA and Surgeon  Anesthesia Plan Comments:         Anesthesia Quick Evaluation

## 2016-09-25 NOTE — Discharge Instructions (Addendum)
Cystoscopy Cystoscopy is a procedure that is used to help your caregiver diagnose and sometimes treat conditions that affect your lower urinary tract. Your lower urinary tract includes your bladder and the tube through which urine passes from your bladder out of your body (urethra). Cystoscopy is performed with a thin, tube-shaped instrument (cystoscope). The cystoscope has lenses and a light at the end so that your caregiver can see inside your bladder. The cystoscope is inserted at the entrance of your urethra. Your caregiver guides it through your urethra and into your bladder. There are two main types of cystoscopy:  Flexible cystoscopy (with a flexible cystoscope).  Rigid cystoscopy (with a rigid cystoscope). Cystoscopy may be recommended for many conditions, including:  Urinary tract infections.  Blood in your urine (hematuria).  Loss of bladder control (urinary incontinence) or overactive bladder.  Unusual cells found in a urine sample.  Urinary blockage.  Painful urination. Cystoscopy may also be done to remove a sample of your tissue to be checked under a microscope (biopsy). It may also be done to remove or destroy bladder stones. LET YOUR CAREGIVER KNOW ABOUT:  Allergies to food or medicine.  Medicines taken, including vitamins, herbs, eyedrops, over-the-counter medicines, and creams.  Use of steroids (by mouth or creams).  Previous problems with anesthetics or numbing medicines.  History of bleeding problems or blood clots.  Previous surgery.  Other health problems, including diabetes and kidney problems.  Possibility of pregnancy, if this applies. PROCEDURE The area around the opening to your urethra will be cleaned. A medicine to numb your urethra (local anesthetic) is used. If a tissue sample or stone is removed during the procedure, you may be given a medicine to make you sleep (general anesthetic). Your caregiver will gently insert the tip of the cystoscope  into your urethra. The cystoscope will be slowly glided through your urethra and into your bladder. Sterile fluid will flow through the cystoscope and into your bladder. The fluid will expand and stretch your bladder. This gives your caregiver a better view of your bladder walls. The procedure lasts about 15 20 minutes. AFTER THE PROCEDURE If a local anesthetic is used, you will be allowed to go home as soon as you are ready. If a general anesthetic is used, you will be taken to a recovery area until you are stable. You may have temporary bleeding and burning on urination. Document Released: 10/27/2000 Document Revised: 07/24/2012 Document Reviewed: 04/22/2012 [facility] Patient Information 2014 [place].   CYSTOSCOPY HOME CARE INSTRUCTIONS  Activity: Rest for the remainder of the day.  Do not drive or operate equipment today.  You may resume normal activities in one to two days as instructed by your physician.   Meals: Drink plenty of liquids and eat light foods such as gelatin or soup this evening.  You may return to a normal meal plan tomorrow.  Return to Work: You may return to work in one to two days or as instructed by your physician.  Special Instructions / Symptoms: Call your physician if any of these symptoms occur:   -persistent or heavy bleeding  -bleeding which continues after first few urination  -large blood clots that are difficult to pass  -urine stream diminishes or stops completely  -fever equal to or higher than 101 degrees Farenheit.  -cloudy urine with a strong, foul odor  -severe pain  Females should always wipe from front to back after elimination.  You may feel some burning pain when you urinate.  This  should disappear with time.  Applying moist heat to the lower abdomen or a hot tub bath may help relieve the pain. \  Follow-Up / Date of Return Visit to Your Physician: Call for an appointment to arrange follow-up.  Patient Signature:   ________________________________________________________  Nurse's Signature:  ________________________________________________________  Post Anesthesia Home Care Instructions  Activity: Get plenty of rest for the remainder of the day. A responsible adult should stay with you for 24 hours following the procedure.  For the next 24 hours, DO NOT: -Drive a car -Paediatric nurse -Drink alcoholic beverages -Take any medication unless instructed by your physician -Make any legal decisions or sign important papers.  Meals: Start with liquid foods such as gelatin or soup. Progress to regular foods as tolerated. Avoid greasy, spicy, heavy foods. If nausea and/or vomiting occur, drink only clear liquids until the nausea and/or vomiting subsides. Call your physician if vomiting continues.  Special Instructions/Symptoms: Your throat may feel dry or sore from the anesthesia or the breathing tube placed in your throat during surgery. If this causes discomfort, gargle with warm salt water. The discomfort should disappear within 24 hours.  If you had a scopolamine patch placed behind your ear for the management of post- operative nausea and/or vomiting:  1. The medication in the patch is effective for 72 hours, after which it should be removed.  Wrap patch in a tissue and discard in the trash. Wash hands thoroughly with soap and water. 2. You may remove the patch earlier than 72 hours if you experience unpleasant side effects which may include dry mouth, dizziness or visual disturbances. 3. Avoid touching the patch. Wash your hands with soap and water after contact with the patch.

## 2016-09-25 NOTE — Op Note (Signed)
Pre-operative diagnosis :  Microscopic hematuria, remote history of tobacco use,  right lower pole renal mass  Postoperative diagnosis:  Right lower pole renal mass, probable TCC.  Operation : Cystourethroscopy, removal of 6 [place] by 24 cm right double-J stent, right retrograde pyelogram with interpretation, passage of 0.038 guidewire, right semirigid ureteroscopy with 4.5 French semirigid ureteroscope and brush biopsy renal pelvis; passage of second 0.038 floppy tip guidewire, passage of single core flexible digital ureteroscope, with removal of guidewire, photodocumentation of right lower pole tumor, brush biopsy of right lower pole tumor.  Surgeon:  Chauncey Cruel. Gaynelle Arabian, MD  First assistant:  None  Anesthesia:  Gen. LMA  Preparation: After appropriate preanesthesia, the patient is brought to the operative room, placed on the operating table in the dorsal supine position where general LMA anesthesia was induced. He was then replaced in the dorsal lithotomy position where the pubis was prepped with Betadine solution and draped in usual fashion. He was given a B and O suppository. He also received preoperative antibiotic IV. The history was reviewed. The x-rays were activated in the operating room for review. The armband was double checked.  Review history:       Notes:   Alan Mendez is a 75 year old male, with a history of gross painless hematuria. This began in July 2017. There was no infection. CT scan and MRI does show an abnormality in the right lower pole, and the right renal pelvis, indicating possible TCC. He does have a 16-pack-year history of tobacco use, but none 27 years differences. He had an attempted right ureteroscopy on 1009, but I was unable to pass the ureteroscope into the renal pelvis, because of the small diameter of the right ureter. Double-J stent was left in place, and he will now be rescheduled for repeat ureteroscopy.(4.5 [place] scope).   Statement of  Likelihood of Success: Excellent.  TIME-OUT observed.:  Procedure:  Cystourethroscopy was accomplished, after dilation of the urethral meatus to a size 28 [place], to accommodate the cystoscope. Following this, the bladder was drained of fluid, and the previously placed double-J stent was identified, grasped, and removed. I was unable to identify the double-J stent on x-ray fluoroscopy, because of a large amount of stool within the colon.  Under some magnification, right retropyelogram was then performed, which showed obstructed right lower pole, but otherwise normal appearing renal pelvis and mid and upper pole. The 0.038 guidewire was coiled in the renal pelvis.  The 4.5 French semirigid ureteroscope was then passed through the ureteral orifice, and into the lower ureter. It was difficult to pass the ureteroscope into the upper ureter, and I was unable to pass the 4.5 French ureteroscope into the renal pelvis. I therefore passed a second 0.038 guidewire into the renal pelvis under direct vision, and removed the ureteroscope. The single bridge digital flexor ureteroscope was then passed through the ureteral orifice and lower ureter, into the upper ureter. With manipulation, I was able to manipulate the small digital ureteroscope into the renal pelvis. This required removal of the second guidewire. I was able to visualize the upper pole, and midpole calyces, with no tumor identified. However, in the lower pole, identified definite transitional cell carcinoma, which was photo documented. Using brush biopsy technique, as well as cold cup biopsy technique, I was able to biopsy the tumor. These tiny biopsies were sent to the cytology for identification.  The patient expressed a desire to not have double-J stent preoperatively. No bleeding was noted during the procedure: I was able to  irrigate any clots freed from the bladder. A stone was identified within the prostate fossa, and this was also irrigated free at the beginning the procedure. I  elected to not leave a stent in place. The bladder was drained of fluid, and the patient received IV Toradol. He also received IV Tylenol during the procedure. He was awakened, and taken to recovery room in good condition.

## 2016-09-25 NOTE — Anesthesia Procedure Notes (Signed)
Procedure Name: LMA Insertion Date/Time: 09/25/2016 9:41 AM Performed by: Wanita Chamberlain Pre-anesthesia Checklist: Patient identified, Emergency Drugs available, Suction available, Patient being monitored and Timeout performed Patient Re-evaluated:Patient Re-evaluated prior to inductionOxygen Delivery Method: Circle system utilized Preoxygenation: Pre-oxygenation with 100% oxygen Intubation Type: IV induction Ventilation: Mask ventilation without difficulty LMA: LMA inserted LMA Size: 4.0 Number of attempts: 1 Placement Confirmation: breath sounds checked- equal and bilateral and positive ETCO2 Tube secured with: Tape

## 2016-09-25 NOTE — Anesthesia Postprocedure Evaluation (Signed)
Anesthesia Post Note  Patient: Alan Mendez  Procedure(s) Performed: Procedure(s) (LRB): CYSTOSCOPY, URETHRAL DILITATION  WITH RETROGRADE PYELOGRAM, BRUSH BIOPSIES OF RIGHT RENAL MASS (Right) RIGHT URETEROSCOPY (Right) CYSTOSCOPY WITH STENT REMOVAL (Right)  Patient location during evaluation: PACU Anesthesia Type: General Level of consciousness: awake and alert Pain management: pain level controlled Vital Signs Assessment: post-procedure vital signs reviewed and stable Respiratory status: spontaneous breathing, nonlabored ventilation, respiratory function stable and patient connected to nasal cannula oxygen Cardiovascular status: blood pressure returned to baseline and stable Postop Assessment: no signs of nausea or vomiting Anesthetic complications: no    Last Vitals:  Vitals:   09/25/16 1130 09/25/16 1145  BP: (!) 126/91 129/67  Pulse: 63 62  Resp: 13 12  Temp:      Last Pain:  Vitals:   09/25/16 1145  TempSrc:   PainSc: 5                  Tiajuana Amass

## 2016-09-25 NOTE — Transfer of Care (Signed)
Immediate Anesthesia Transfer of Care Note  Patient: Alan Mendez  Procedure(s) Performed: Procedure(s): CYSTOSCOPY, URETHRAL DILITATION  WITH RETROGRADE PYELOGRAM, BRUSH BIOPSIES OF RIGHT RENAL MASS (Right) RIGHT URETEROSCOPY (Right) CYSTOSCOPY WITH STENT REMOVAL (Right)  Patient Location: PACU  Anesthesia Type:General  Level of Consciousness: awake, alert , oriented and patient cooperative  Airway & Oxygen Therapy: Patient Spontanous Breathing and Patient connected to nasal cannula oxygen  Post-op Assessment: Report given to RN and Post -op Vital signs reviewed and stable  Post vital signs: Reviewed and stable  Last Vitals:  Vitals:   09/25/16 0737  BP: 123/63  Pulse: (!) 42  Resp: 14  Temp: 36.3 C    Last Pain:  Vitals:   09/25/16 0752  TempSrc:   PainSc: 4       Patients Stated Pain Goal: 5 (56/38/93 7342)  Complications: No apparent anesthesia complications

## 2016-09-26 ENCOUNTER — Encounter (HOSPITAL_BASED_OUTPATIENT_CLINIC_OR_DEPARTMENT_OTHER): Payer: Self-pay | Admitting: Urology

## 2016-10-12 DIAGNOSIS — D3000 Benign neoplasm of unspecified kidney: Secondary | ICD-10-CM | POA: Diagnosis not present

## 2016-10-14 ENCOUNTER — Other Ambulatory Visit: Payer: Self-pay | Admitting: Oncology

## 2016-10-20 ENCOUNTER — Other Ambulatory Visit: Payer: Self-pay | Admitting: Urology

## 2016-11-08 ENCOUNTER — Encounter (HOSPITAL_COMMUNITY)
Admission: RE | Admit: 2016-11-08 | Discharge: 2016-11-08 | Disposition: A | Discharge status: Home / Self Care | Payer: Medicare Other | Source: Ambulatory Visit | Attending: Urology | Admitting: Urology

## 2016-11-08 ENCOUNTER — Encounter (HOSPITAL_COMMUNITY): Payer: Self-pay

## 2016-11-08 DIAGNOSIS — Z0183 Encounter for blood typing: Secondary | ICD-10-CM | POA: Insufficient documentation

## 2016-11-08 DIAGNOSIS — D49511 Neoplasm of unspecified behavior of right kidney: Secondary | ICD-10-CM | POA: Diagnosis not present

## 2016-11-08 DIAGNOSIS — R001 Bradycardia, unspecified: Secondary | ICD-10-CM | POA: Diagnosis not present

## 2016-11-08 DIAGNOSIS — Z01818 Encounter for other preprocedural examination: Secondary | ICD-10-CM | POA: Diagnosis not present

## 2016-11-08 DIAGNOSIS — Z01812 Encounter for preprocedural laboratory examination: Secondary | ICD-10-CM | POA: Diagnosis not present

## 2016-11-08 HISTORY — DX: Pneumonia, unspecified organism: J18.9

## 2016-11-08 LAB — BASIC METABOLIC PANEL
ANION GAP: 8 (ref 5–15)
BUN: 26 mg/dL — ABNORMAL HIGH (ref 6–20)
CHLORIDE: 106 mmol/L (ref 101–111)
CO2: 28 mmol/L (ref 22–32)
CREATININE: 1.02 mg/dL (ref 0.61–1.24)
Calcium: 9.1 mg/dL (ref 8.9–10.3)
GFR calc non Af Amer: >60 mL/min (ref >60)
Glucose, Bld: 95 mg/dL (ref 65–99)
POTASSIUM: 4.2 mmol/L (ref 3.5–5.1)
SODIUM: 142 mmol/L (ref 135–145)

## 2016-11-08 LAB — ABO/RH: ABO/RH(D): O POS

## 2016-11-08 LAB — CBC
HEMATOCRIT: 42.5 % (ref 39.0–52.0)
HEMOGLOBIN: 14.5 g/dL (ref 13.0–17.0)
MCH: 29.1 pg (ref 26.0–34.0)
MCHC: 34.1 g/dL (ref 30.0–36.0)
MCV: 85.3 fL (ref 78.0–100.0)
PLATELETS: 260 10*3/uL (ref 150–400)
RBC: 4.98 MIL/uL (ref 4.22–5.81)
RDW: 13.2 % (ref 11.5–15.5)
WBC: 7 10*3/uL (ref 4.0–10.5)

## 2016-11-08 NOTE — Patient Instructions (Addendum)
Alan Mendez  11/08/2016   Your procedure is scheduled on: Wednesday November 15, 2016  Report to [facility] Main  Entrance take [name]  elevators to 3rd floor to  [place] at 6:30 AM.  Call this number if you have problems the morning of surgery [phone]   Remember: ONLY 1 PERSON MAY GO WITH YOU TO SHORT STAY TO GET  READY MORNING OF [place].  Do not eat food or drink liquids :After Midnight.     Take these medicines the morning of surgery :NONE                                You may not have any metal on your body including hair pins and              piercings  Do not wear jewelry, , lotions, powders or colognes, deodorant                       Men may shave face and neck.   Do not bring valuables to the hospital. [place].  Contacts, dentures or bridgework may not be worn into surgery.  Leave suitcase in the car. After surgery it may be brought to your room.   Special Instructions: FOLLOW SURGEON'S INSTRUCTIONS PRIOR TO SURGICAL PROCEDURE DATE IN REGARDS TO BOWEL PREPARATION    _____________________________________________________________________                               [facility] - Preparing for Surgery Before surgery, you can play an important role.  Because skin is not sterile, your skin needs to be as free of germs as possible.  You can reduce the number of germs on your skin by washing with CHG (chlorahexidine gluconate) soap before surgery.  CHG is an antiseptic cleaner which kills germs and bonds with the skin to continue killing germs even after washing. Please DO NOT use if you have an allergy to CHG or antibacterial soaps.  If your skin becomes reddened/irritated stop using the CHG and inform your nurse when you arrive at Short Stay. Do not shave (including legs and underarms) for at least 48 hours prior to the first CHG shower.  You may shave your face/neck. Please  follow these instructions carefully:  1.  Shower with CHG Soap the night before surgery and the  morning of Surgery.  2.  If you choose to wash your hair, wash your hair first as usual with your  normal  shampoo.  3.  After you shampoo, rinse your hair and body thoroughly to remove the  shampoo.                           4.  Use CHG as you would any other liquid soap.  You can apply chg directly  to the skin and wash                       Gently with a scrungie or clean washcloth.  5.  Apply the CHG Soap to your body ONLY FROM THE NECK DOWN.  Do not use on face/ open                           Wound or open sores. Avoid contact with eyes, ears mouth and genitals (private parts).                       Wash face,  Genitals (private parts) with your normal soap.             6.  Wash thoroughly, paying special attention to the area where your surgery  will be performed.  7.  Thoroughly rinse your body with warm water from the neck down.  8.  DO NOT shower/wash with your normal soap after using and rinsing off  the CHG Soap.                9.  Pat yourself dry with a clean towel.            10.  Wear clean pajamas.            11.  Place clean sheets on your bed the night of your first shower and do not  sleep with pets. Day of Surgery : Do not apply any lotions/deodorants the morning of surgery.  Please wear clean clothes to the hospital/surgery center.  FAILURE TO FOLLOW THESE INSTRUCTIONS MAY RESULT IN THE CANCELLATION OF YOUR SURGERY PATIENT SIGNATURE_________________________________  NURSE SIGNATURE__________________________________  ________________________________________________________________________

## 2016-11-09 LAB — URINE CULTURE: CULTURE: NO GROWTH

## 2016-11-09 NOTE — Progress Notes (Signed)
BMP results in epic per PAT visit 11/08/2016 sent to Dr Louis Meckel

## 2016-11-09 NOTE — Progress Notes (Signed)
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 Have spoken with Anesthesia/Dr Seward Speck whom reviewed EKGs per epic from 11/08/2016, 06/27/2012, and 09/15/2010. Anesthesia to see pt day of surgery.

## 2016-11-10 ENCOUNTER — Other Ambulatory Visit: Payer: Self-pay | Admitting: Oncology

## 2016-11-15 ENCOUNTER — Inpatient Hospital Stay (HOSPITAL_COMMUNITY): Payer: Medicare Other | Admitting: Anesthesiology

## 2016-11-15 ENCOUNTER — Inpatient Hospital Stay (HOSPITAL_COMMUNITY)
Admission: RE | Admit: 2016-11-15 | Discharge: 2016-11-17 | DRG: 658 | Disposition: A | Discharge status: Home / Self Care | Payer: Medicare Other | Source: Ambulatory Visit | Attending: Urology | Admitting: Urology

## 2016-11-15 ENCOUNTER — Encounter (HOSPITAL_COMMUNITY)
Admission: RE | Disposition: A | Discharge status: Home / Self Care | Payer: Self-pay | Source: Ambulatory Visit | Attending: Urology

## 2016-11-15 ENCOUNTER — Encounter (HOSPITAL_COMMUNITY): Payer: Self-pay | Admitting: *Deleted

## 2016-11-15 DIAGNOSIS — C651 Malignant neoplasm of right renal pelvis: Principal | ICD-10-CM | POA: Diagnosis present

## 2016-11-15 DIAGNOSIS — Z7982 Long term (current) use of aspirin: Secondary | ICD-10-CM

## 2016-11-15 DIAGNOSIS — F418 Other specified anxiety disorders: Secondary | ICD-10-CM | POA: Diagnosis present

## 2016-11-15 DIAGNOSIS — Z888 Allergy status to other drugs, medicaments and biological substances status: Secondary | ICD-10-CM | POA: Diagnosis not present

## 2016-11-15 DIAGNOSIS — Z87891 Personal history of nicotine dependence: Secondary | ICD-10-CM

## 2016-11-15 DIAGNOSIS — D49511 Neoplasm of unspecified behavior of right kidney: Secondary | ICD-10-CM | POA: Diagnosis not present

## 2016-11-15 DIAGNOSIS — D0919 Carcinoma in situ of other urinary organs: Secondary | ICD-10-CM | POA: Diagnosis not present

## 2016-11-15 DIAGNOSIS — C641 Malignant neoplasm of right kidney, except renal pelvis: Secondary | ICD-10-CM | POA: Diagnosis present

## 2016-11-15 DIAGNOSIS — Z79899 Other long term (current) drug therapy: Secondary | ICD-10-CM | POA: Diagnosis not present

## 2016-11-15 DIAGNOSIS — N138 Other obstructive and reflux uropathy: Secondary | ICD-10-CM | POA: Diagnosis not present

## 2016-11-15 DIAGNOSIS — N401 Enlarged prostate with lower urinary tract symptoms: Secondary | ICD-10-CM | POA: Diagnosis not present

## 2016-11-15 DIAGNOSIS — D4111 Neoplasm of uncertain behavior of right renal pelvis: Secondary | ICD-10-CM | POA: Diagnosis not present

## 2016-11-15 DIAGNOSIS — N200 Calculus of kidney: Secondary | ICD-10-CM | POA: Diagnosis not present

## 2016-11-15 HISTORY — PX: ROBOT ASSITED LAPAROSCOPIC NEPHROURETERECTOMY: SHX6077

## 2016-11-15 LAB — BASIC METABOLIC PANEL
Anion gap: 10 (ref 5–15)
BUN: 23 mg/dL — AB (ref 6–20)
CHLORIDE: 100 mmol/L — AB (ref 101–111)
CO2: 27 mmol/L (ref 22–32)
Calcium: 8.4 mg/dL — ABNORMAL LOW (ref 8.9–10.3)
Creatinine, Ser: 1.27 mg/dL — ABNORMAL HIGH (ref 0.61–1.24)
GFR calc Af Amer: >60 mL/min (ref >60)
GFR calc non Af Amer: 54 mL/min — ABNORMAL LOW (ref >60)
Glucose, Bld: 144 mg/dL — ABNORMAL HIGH (ref 65–99)
POTASSIUM: 4 mmol/L (ref 3.5–5.1)
Sodium: 137 mmol/L (ref 135–145)

## 2016-11-15 LAB — TYPE AND SCREEN
ABO/RH(D): O POS
Antibody Screen: NEGATIVE

## 2016-11-15 LAB — HEMOGLOBIN AND HEMATOCRIT, BLOOD
HCT: 43.2 % (ref 39.0–52.0)
Hemoglobin: 14.7 g/dL (ref 13.0–17.0)

## 2016-11-15 SURGERY — NEPHROURETERECTOMY, ROBOT-ASSISTED, LAPAROSCOPIC
Anesthesia: General | Laterality: Right

## 2016-11-15 MED ORDER — PROPOFOL 10 MG/ML IV BOLUS
INTRAVENOUS | Status: DC | PRN
  Administered 2016-11-15: 150 mg via INTRAVENOUS

## 2016-11-15 MED ORDER — PROMETHAZINE HCL 25 MG/ML IJ SOLN: 6.2500 mg | INTRAMUSCULAR | Status: DC | PRN

## 2016-11-15 MED ORDER — FENTANYL CITRATE (PF) 100 MCG/2ML IJ SOLN
INTRAMUSCULAR | Status: DC | PRN
  Administered 2016-11-15 (×7): 50 ug via INTRAVENOUS

## 2016-11-15 MED ORDER — LACTATED RINGERS IV SOLN
INTRAVENOUS | Status: DC | PRN
  Administered 2016-11-15 (×3): via INTRAVENOUS

## 2016-11-15 MED ORDER — OXYCODONE HCL 5 MG PO TABS: 10.0000 mg | ORAL_TABLET | ORAL | Status: DC | PRN

## 2016-11-15 MED ORDER — CIPROFLOXACIN IN D5W 400 MG/200ML IV SOLN
INTRAVENOUS | Status: AC
  Filled 2016-11-15: qty 200

## 2016-11-15 MED ORDER — SUCCINYLCHOLINE CHLORIDE 200 MG/10ML IV SOSY
PREFILLED_SYRINGE | INTRAVENOUS | Status: DC | PRN
  Administered 2016-11-15: 120 mg via INTRAVENOUS

## 2016-11-15 MED ORDER — SODIUM CHLORIDE 0.9 % IJ SOLN
INTRAMUSCULAR | Status: DC | PRN
  Administered 2016-11-15: 20 mL

## 2016-11-15 MED ORDER — HYDROMORPHONE HCL 1 MG/ML IJ SOLN: 0.2500 mg | INTRAMUSCULAR | Status: DC | PRN

## 2016-11-15 MED ORDER — BUPIVACAINE HCL (PF) 0.25 % IJ SOLN
INTRAMUSCULAR | Status: DC | PRN
  Administered 2016-11-15: 13 mL

## 2016-11-15 MED ORDER — CIPROFLOXACIN IN D5W 400 MG/200ML IV SOLN
400.0000 mg | INTRAVENOUS | Status: AC
  Administered 2016-11-15: 400 mg via INTRAVENOUS

## 2016-11-15 MED ORDER — FENTANYL CITRATE (PF) 250 MCG/5ML IJ SOLN
INTRAMUSCULAR | Status: AC
  Filled 2016-11-15: qty 5

## 2016-11-15 MED ORDER — OXYCODONE HCL 5 MG PO TABS
5.0000 mg | ORAL_TABLET | ORAL | Status: DC | PRN
  Administered 2016-11-15 – 2016-11-16 (×3): 10 mg via ORAL
  Administered 2016-11-16 (×2): 5 mg via ORAL
  Administered 2016-11-16 – 2016-11-17 (×2): 10 mg via ORAL
  Filled 2016-11-15 (×2): qty 2
  Filled 2016-11-15: qty 1
  Filled 2016-11-15: qty 2
  Filled 2016-11-15: qty 1
  Filled 2016-11-15 (×2): qty 2

## 2016-11-15 MED ORDER — GLYCOPYRROLATE 0.2 MG/ML IV SOSY
PREFILLED_SYRINGE | INTRAVENOUS | Status: AC
  Filled 2016-11-15: qty 3

## 2016-11-15 MED ORDER — OXYCODONE-ACETAMINOPHEN 5-325 MG PO TABS: 1.0000 | ORAL_TABLET | ORAL | 0 refills | Status: DC | PRN

## 2016-11-15 MED ORDER — LACTATED RINGERS IR SOLN
Status: DC | PRN
  Administered 2016-11-15: 3000 mL

## 2016-11-15 MED ORDER — PHENYLEPHRINE HCL 10 MG/ML IJ SOLN
INTRAMUSCULAR | Status: DC | PRN
  Administered 2016-11-15 (×2): 80 ug via INTRAVENOUS

## 2016-11-15 MED ORDER — PROPOFOL 10 MG/ML IV BOLUS
INTRAVENOUS | Status: AC
  Filled 2016-11-15: qty 20

## 2016-11-15 MED ORDER — MIDAZOLAM HCL 2 MG/2ML IJ SOLN: 0.5000 mg | Freq: Once | INTRAMUSCULAR | Status: DC | PRN

## 2016-11-15 MED ORDER — ACETAMINOPHEN 10 MG/ML IV SOLN
1000.0000 mg | Freq: Four times a day (QID) | INTRAVENOUS | Status: AC
  Administered 2016-11-15 – 2016-11-16 (×4): 1000 mg via INTRAVENOUS
  Filled 2016-11-15 (×4): qty 100

## 2016-11-15 MED ORDER — BUPIVACAINE LIPOSOME 1.3 % IJ SUSP
INTRAMUSCULAR | Status: AC
  Filled 2016-11-15: qty 20

## 2016-11-15 MED ORDER — ROCURONIUM BROMIDE 50 MG/5ML IV SOSY
PREFILLED_SYRINGE | INTRAVENOUS | Status: AC
  Filled 2016-11-15: qty 5

## 2016-11-15 MED ORDER — MIDAZOLAM HCL 2 MG/2ML IJ SOLN
INTRAMUSCULAR | Status: AC
  Filled 2016-11-15: qty 2

## 2016-11-15 MED ORDER — SODIUM CHLORIDE 0.9 % IR SOLN
Status: DC | PRN
  Administered 2016-11-15: 100 mL

## 2016-11-15 MED ORDER — BUPIVACAINE LIPOSOME 1.3 % IJ SUSP
INTRAMUSCULAR | Status: DC | PRN
  Administered 2016-11-15: 20 mL

## 2016-11-15 MED ORDER — ROCURONIUM BROMIDE 10 MG/ML (PF) SYRINGE
PREFILLED_SYRINGE | INTRAVENOUS | Status: DC | PRN
  Administered 2016-11-15: 10 mg via INTRAVENOUS
  Administered 2016-11-15: 50 mg via INTRAVENOUS
  Administered 2016-11-15: 10 mg via INTRAVENOUS

## 2016-11-15 MED ORDER — FENTANYL CITRATE (PF) 100 MCG/2ML IJ SOLN
INTRAMUSCULAR | Status: AC
  Filled 2016-11-15: qty 2

## 2016-11-15 MED ORDER — PHENYLEPHRINE 40 MCG/ML (10ML) SYRINGE FOR IV PUSH (FOR BLOOD PRESSURE SUPPORT)
PREFILLED_SYRINGE | INTRAVENOUS | Status: AC
  Filled 2016-11-15: qty 10

## 2016-11-15 MED ORDER — EPHEDRINE SULFATE-NACL 50-0.9 MG/10ML-% IV SOSY
PREFILLED_SYRINGE | INTRAVENOUS | Status: DC | PRN
  Administered 2016-11-15: 5 mg via INTRAVENOUS

## 2016-11-15 MED ORDER — SUGAMMADEX SODIUM 200 MG/2ML IV SOLN
INTRAVENOUS | Status: DC | PRN
  Administered 2016-11-15: 200 mg via INTRAVENOUS

## 2016-11-15 MED ORDER — HYDROMORPHONE HCL 1 MG/ML IJ SOLN
0.5000 mg | INTRAMUSCULAR | Status: DC | PRN
  Administered 2016-11-15: 1 mg via INTRAVENOUS
  Filled 2016-11-15: qty 1

## 2016-11-15 MED ORDER — DEXAMETHASONE SODIUM PHOSPHATE 10 MG/ML IJ SOLN
INTRAMUSCULAR | Status: DC | PRN
  Administered 2016-11-15: 10 mg via INTRAVENOUS

## 2016-11-15 MED ORDER — MEPERIDINE HCL 50 MG/ML IJ SOLN: 6.2500 mg | INTRAMUSCULAR | Status: DC | PRN

## 2016-11-15 MED ORDER — DEXAMETHASONE SODIUM PHOSPHATE 10 MG/ML IJ SOLN
INTRAMUSCULAR | Status: AC
  Filled 2016-11-15: qty 1

## 2016-11-15 MED ORDER — LACTATED RINGERS IV SOLN
INTRAVENOUS | Status: DC
  Administered 2016-11-15 – 2016-11-16 (×2): via INTRAVENOUS

## 2016-11-15 MED ORDER — CEFAZOLIN SODIUM-DEXTROSE 2-4 GM/100ML-% IV SOLN
2.0000 g | INTRAVENOUS | Status: AC
  Administered 2016-11-15: 2 g via INTRAVENOUS

## 2016-11-15 MED ORDER — CEFAZOLIN SODIUM-DEXTROSE 2-4 GM/100ML-% IV SOLN
INTRAVENOUS | Status: AC
  Filled 2016-11-15: qty 100

## 2016-11-15 MED ORDER — SENNA 8.6 MG PO TABS
1.0000 | ORAL_TABLET | Freq: Two times a day (BID) | ORAL | Status: DC
  Administered 2016-11-15 – 2016-11-17 (×4): 8.6 mg via ORAL
  Filled 2016-11-15 (×5): qty 1

## 2016-11-15 MED ORDER — LIDOCAINE 2% (20 MG/ML) 5 ML SYRINGE
INTRAMUSCULAR | Status: AC
  Filled 2016-11-15: qty 5

## 2016-11-15 MED ORDER — ONDANSETRON HCL 4 MG/2ML IJ SOLN: 4.0000 mg | INTRAMUSCULAR | Status: DC | PRN

## 2016-11-15 MED ORDER — SODIUM CHLORIDE 0.9 % IJ SOLN
INTRAMUSCULAR | Status: AC
  Filled 2016-11-15: qty 50

## 2016-11-15 MED ORDER — BUPIVACAINE HCL (PF) 0.25 % IJ SOLN
INTRAMUSCULAR | Status: AC
  Filled 2016-11-15: qty 30

## 2016-11-15 MED ORDER — EPHEDRINE 5 MG/ML INJ
INTRAVENOUS | Status: AC
  Filled 2016-11-15: qty 10

## 2016-11-15 MED ORDER — MIDAZOLAM HCL 5 MG/5ML IJ SOLN
INTRAMUSCULAR | Status: DC | PRN
Start: 2016-11-15 — End: 2016-11-15
  Administered 2016-11-15: 2 mg via INTRAVENOUS

## 2016-11-15 MED ORDER — LIDOCAINE 2% (20 MG/ML) 5 ML SYRINGE
INTRAMUSCULAR | Status: DC | PRN
  Administered 2016-11-15: 100 mg via INTRAVENOUS

## 2016-11-15 MED ORDER — SUCCINYLCHOLINE CHLORIDE 200 MG/10ML IV SOSY
PREFILLED_SYRINGE | INTRAVENOUS | Status: AC
  Filled 2016-11-15: qty 10

## 2016-11-15 MED ORDER — ONDANSETRON HCL 4 MG/2ML IJ SOLN
INTRAMUSCULAR | Status: DC | PRN
  Administered 2016-11-15: 4 mg via INTRAVENOUS

## 2016-11-15 MED ORDER — DOCUSATE SODIUM 100 MG PO CAPS
100.0000 mg | ORAL_CAPSULE | Freq: Two times a day (BID) | ORAL | Status: DC
  Administered 2016-11-15 – 2016-11-17 (×4): 100 mg via ORAL
  Filled 2016-11-15 (×5): qty 1

## 2016-11-15 MED ORDER — GLYCOPYRROLATE 0.2 MG/ML IV SOSY
PREFILLED_SYRINGE | INTRAVENOUS | Status: DC | PRN
  Administered 2016-11-15: .2 mg via INTRAVENOUS

## 2016-11-15 MED ORDER — ONDANSETRON HCL 4 MG/2ML IJ SOLN
INTRAMUSCULAR | Status: AC
  Filled 2016-11-15: qty 2

## 2016-11-15 SURGICAL SUPPLY — 68 items
BAG LAPAROSCOPIC 12 15 PORT 16 (BASKET) ×2 IMPLANT
BAG RETRIEVAL 12/15 (BASKET) ×4
BAG RETRIEVAL 12/15MM (BASKET) ×2
CATH FOLEY 3WAY  5CC 18FR (CATHETERS) ×2
CATH FOLEY 3WAY 5CC 18FR (CATHETERS) ×1 IMPLANT
CHLORAPREP W/TINT 26ML (MISCELLANEOUS) ×3 IMPLANT
CLIP LIGATING HEM O LOK PURPLE (MISCELLANEOUS) ×3 IMPLANT
CLIP LIGATING HEMO LOK XL GOLD (MISCELLANEOUS) ×3 IMPLANT
CLIP LIGATING HEMO O LOK GREEN (MISCELLANEOUS) ×6 IMPLANT
COVER SURGICAL LIGHT HANDLE (MISCELLANEOUS) ×3 IMPLANT
COVER TIP SHEARS 8 DVNC (MISCELLANEOUS) ×3 IMPLANT
COVER TIP SHEARS 8MM DA VINCI (MISCELLANEOUS) ×6
CUTTER ECHEON FLEX ENDO 45 340 (ENDOMECHANICALS) ×3 IMPLANT
CUTTER FLEX LINEAR 45M (STAPLE) IMPLANT
DECANTER SPIKE VIAL GLASS SM (MISCELLANEOUS) ×3 IMPLANT
DERMABOND ADVANCED (GAUZE/BANDAGES/DRESSINGS) ×2
DERMABOND ADVANCED .7 DNX12 (GAUZE/BANDAGES/DRESSINGS) ×1 IMPLANT
DRAIN CHANNEL 15F RND FF 3/16 (WOUND CARE) ×3 IMPLANT
DRAPE ARM DVNC X/XI (DISPOSABLE) ×4 IMPLANT
DRAPE COLUMN DVNC XI (DISPOSABLE) ×1 IMPLANT
DRAPE DA VINCI XI ARM (DISPOSABLE) ×8
DRAPE DA VINCI XI COLUMN (DISPOSABLE) ×2
DRAPE INCISE IOBAN 66X45 STRL (DRAPES) ×3 IMPLANT
DRAPE SHEET LG 3/4 BI-LAMINATE (DRAPES) ×3 IMPLANT
ELECT PENCIL ROCKER SW 15FT (MISCELLANEOUS) ×3 IMPLANT
ELECT REM PT RETURN 9FT ADLT (ELECTROSURGICAL) ×3
ELECTRODE REM PT RTRN 9FT ADLT (ELECTROSURGICAL) ×1 IMPLANT
EVACUATOR SILICONE 100CC (DRAIN) ×3 IMPLANT
GAUZE SPONGE 2X2 8PLY STRL LF (GAUZE/BANDAGES/DRESSINGS) IMPLANT
GLOVE BIO SURGEON STRL SZ 6.5 (GLOVE) ×2 IMPLANT
GLOVE BIO SURGEONS STRL SZ 6.5 (GLOVE) ×1
GLOVE BIOGEL M STRL SZ7.5 (GLOVE) ×12 IMPLANT
GOWN STRL REUS W/TWL LRG LVL3 (GOWN DISPOSABLE) ×18 IMPLANT
IRRIG SUCT STRYKERFLOW 2 WTIP (MISCELLANEOUS) ×3
IRRIGATION SUCT STRKRFLW 2 WTP (MISCELLANEOUS) ×1 IMPLANT
KIT BASIN OR (CUSTOM PROCEDURE TRAY) ×3 IMPLANT
LOOP VESSEL MAXI BLUE (MISCELLANEOUS) ×3 IMPLANT
PAD POSITIONING PINK XL (MISCELLANEOUS) ×3 IMPLANT
PLUG CATH AND CAP STER (CATHETERS) ×3 IMPLANT
PORT ACCESS TROCAR AIRSEAL 12 (TROCAR) ×1 IMPLANT
PORT ACCESS TROCAR AIRSEAL 5M (TROCAR) ×2
POSITIONER SURGICAL ARM (MISCELLANEOUS) ×15 IMPLANT
RELOAD 45 VASCULAR/THIN (ENDOMECHANICALS) IMPLANT
RELOAD WH ECHELON 45 (STAPLE) ×6 IMPLANT
SEAL CANN UNIV 5-8 DVNC XI (MISCELLANEOUS) ×4 IMPLANT
SEAL XI 5MM-8MM UNIVERSAL (MISCELLANEOUS) ×8
SET TRI-LUMEN FLTR TB AIRSEAL (TUBING) ×3 IMPLANT
SOLUTION ELECTROLUBE (MISCELLANEOUS) ×3 IMPLANT
SPONGE GAUZE 2X2 STER 10/PKG (GAUZE/BANDAGES/DRESSINGS)
SPONGE LAP 4X18 X RAY DECT (DISPOSABLE) ×3 IMPLANT
SUT ETHILON 3 0 PS 1 (SUTURE) ×3 IMPLANT
SUT MNCRL AB 4-0 PS2 18 (SUTURE) ×9 IMPLANT
SUT PDS AB 1 CTX 36 (SUTURE) ×6 IMPLANT
SUT V-LOC BARB 180 2/0GR6 GS22 (SUTURE)
SUT VIC AB 0 UR5 27 (SUTURE) ×3 IMPLANT
SUT VICRYL 0 UR6 27IN ABS (SUTURE) ×6 IMPLANT
SUT VLOC BARB 180 ABS3/0GR12 (SUTURE) ×6
SUTURE V-LC BRB 180 2/0GR6GS22 (SUTURE) IMPLANT
SUTURE VLOC BRB 180 ABS3/0GR12 (SUTURE) ×2 IMPLANT
TAPE STRIPS DRAPE STRL (GAUZE/BANDAGES/DRESSINGS) IMPLANT
TOWEL OR 17X26 10 PK STRL BLUE (TOWEL DISPOSABLE) ×3 IMPLANT
TOWEL OR NON WOVEN STRL DISP B (DISPOSABLE) ×3 IMPLANT
TRAY FOLEY W/METER SILVER 16FR (SET/KITS/TRAYS/PACK) ×3 IMPLANT
TRAY LAPAROSCOPIC (CUSTOM PROCEDURE TRAY) ×3 IMPLANT
TROCAR XCEL 12X100 BLDLESS (ENDOMECHANICALS) IMPLANT
WATER STERILE IRR 1000ML POUR (IV SOLUTION) ×3 IMPLANT
WATER STERILE IRR 1000ML UROMA (IV SOLUTION) IMPLANT
WATER STERILE IRR 1500ML POUR (IV SOLUTION) IMPLANT

## 2016-11-15 NOTE — Progress Notes (Signed)
Lab in- unsuccessful attempts to draw blood work ordered- another [employer] called

## 2016-11-15 NOTE — H&P (Signed)
Alan Mendez seen today to discuss right nephro-ureterectomy low grade/high volume right lower pole TCC.   The patient developed gross hematuria in July 2017. CT scan and subsequent MRI confirmed an enhancing mass in the right collecting system. RPG was done on 10/9 and 11/13 confirming filling defect. A brush biopsy was done on 11/13 which was negative for high grade malignancy. Description of tumor suggest bulky low grade TCC. No stent was left at the time of his second procedure.   Imaging shows a simple right renal hilum - single artery/vein with early arterial branch. No LAD.   The patient has a history of an appendectomy as a child, no other intra-abdominal operations.   The patient takes very few medicines and is in otherwise excellent health.   Sadly, the patient's wife is currently dying from metastatic breast cancer.     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 Asymptomatic microscopic hematuria - 06/29/2016 Bladder Stone - 06/29/2016 BPH w/LUTS - 06/29/2016, - 06/23/2016, Benign prostatic hyperplasia with urinary obstruction, - 2016 Calculus Ureter - 06/29/2016, Calculus of left ureter, - 2014 Kidney Stone (Stable) - 06/29/2016, Nephrolithiasis, - 2016 Renal Pelvis, Right, Neoplasm of uncertain behavior - 5/40/0867 Urinary Calculus, Unspec - 06/23/2016 Elevated PSA, Elevated prostate specific antigen (PSA) - 2016 Urinary Frequency, Urinary frequency - 2016 Dorsalgia, Unspec, Backache - 2014 Other microscopic hematuria, Microscopic  hematuria - 2014 Personal Hx urinary calculi, Nephrolithiasis - 2014 Renal Cysts, Simple, Renal cyst, acquired - 2014    NON-GU PMH: Other specified postprocedural states - 09/04/2016 Encounter for general adult medical examination without abnormal findings, Encounter for preventive health examination - 2015 Personal history of other endocrine, nutritional and metabolic disease, History of hypercholesterolemia - 2014 Personal history of other mental and behavioral disorders, History of depression - 2014    FAMILY HISTORY: Cancer - Father Father Deceased At Age45 ___ - Runs In Family Heart Disease - Mother Mother Deceased At Age 90 from diabetic complicati - Runs In Family   SOCIAL HISTORY: Marital Status: Married Drinks 1 drink per week.  Drinks 4+ caffeinated drinks per day.    REVIEW OF SYSTEMS:    GU Review Male:   Patient denies frequent urination, hard to postpone urination, burning/ pain with urination, get up at night to urinate, leakage of urine, stream starts and stops, trouble starting your stream, have to strain to urinate , erection problems, and penile pain.  Gastrointestinal (Upper):   Patient denies nausea, vomiting, and indigestion/ heartburn.  Gastrointestinal (Lower):   Patient denies diarrhea and constipation.  Constitutional:   Patient denies fever, night sweats, weight loss, and fatigue.  Skin:   Patient denies skin rash/ lesion and itching.  Eyes:   Patient denies blurred vision and double vision.  Ears/ Nose/ Throat:   Patient denies sore throat and sinus problems.  Hematologic/Lymphatic:   Patient denies swollen glands and easy bruising.  Cardiovascular:   Patient denies leg swelling and chest pains.  Respiratory:   Patient denies cough and shortness of  breath.  Endocrine:   Patient denies excessive thirst.  Musculoskeletal:   Patient denies back pain and joint pain.  Neurological:   Patient denies headaches and dizziness.  Psychologic:   Patient reports  depression and anxiety.    VITAL SIGNS:      10/12/2016 12:45 PM  BP 139/85 mmHg  Pulse 61 /min   MULTI-SYSTEM PHYSICAL EXAMINATION:    Constitutional: Well-nourished. No physical deformities. Normally developed. Good grooming.  Neck: Neck symmetrical, not swollen. Normal tracheal position.  Respiratory: No labored breathing, no use of accessory muscles. Clear to auscultation bilaterally  Cardiovascular: Normal temperature, normal extremity pulses, no swelling, no varicosities. Regular rate and rhythm  Lymphatic: No enlargement of neck, axillae, groin.  Skin: No paleness, no jaundice, no cyanosis. No lesion, no ulcer, no rash.  Neurologic / Psychiatric: Oriented to time, oriented to place, oriented to person. No depression, no anxiety, no agitation.  Gastrointestinal: No mass, no tenderness, no rigidity, non obese abdomen.  Eyes: Normal conjunctivae. Normal eyelids.  Ears, Nose, Mouth, and Throat: Left ear no scars, no lesions, no masses. Right ear no scars, no lesions, no masses. Nose no scars, no lesions, no masses. Normal hearing. Normal lips.  Musculoskeletal: Normal gait and station of head and neck.     PAST DATA REVIEWED:  Source Of History:  Patient  Records Review:   Previous Patient Records   06/23/16 03/31/15 02/25/14 12/31/12 12/20/11 06/21/11 12/21/10 06/22/10  PSA  Total PSA 4.00 ng/dl 4.64  5.03  4.72  5.62  5.20  5.90  5.51   Free PSA  1.11  1.03  1.17  1.04  1.19  1.2  1.03   % Free PSA  '24  20  25  19  23  20  '$ 18.7     PROCEDURES:          Urinalysis w/Scope Dipstick Dipstick Cont'd Micro  Color: Amber Bilirubin: Neg WBC/hpf: 0 - 5/hpf  Appearance: Cloudy Ketones: Neg RBC/hpf: >60/hpf  Specific Gravity: 1.020 Blood: 3+ Bacteria: NS (Not Seen)  pH: 5.5 Protein: 1+ Cystals: NS (Not Seen)  Glucose: Neg Urobilinogen: 0.2 Casts: NS (Not Seen)    Nitrites: Neg Trichomonas: Not Present    Leukocyte Esterase: Neg Mucous: Present      Epithelial Cells: NS (Not Seen)       Yeast: NS (Not Seen)      Sperm: Not Present    ASSESSMENT:      ICD-10 Details  1 GU:   Benign Neo Kidney, Unspec - D30.00 The patient has a large low-grade TCC in his right renal pelvis/lower pole. He is otherwise asymptomatic. He currently does not have a stent.   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 We discussed the treatment options including endoscopic resection versus nephroureterectomy. Ultimately, I recommended the patient undergo robotic-assisted laparoscopic right nephroureterectomy. I described the surgery for the patient in quite a bit of detail. We discussed the risks and benefits of it. We also discussed the hospital course and the expected recovery time. The patient is otherwise very healthy, expect he will do very well from the surgery. He understands that following the operation he will have a catheter for 7 days. We will try to get this scheduled before the end of the year. However, given the  social situation that he is in right now on this may be somewhat untimely. If the patient needs to postpone the surgery because of his wife's sickness/impending death, it would certainly be reasonable and understanding.

## 2016-11-15 NOTE — Anesthesia Preprocedure Evaluation (Signed)
Anesthesia Evaluation  Patient identified by MRN, date of birth, ID band Patient awake    Reviewed: Allergy & Precautions, NPO status , Patient's Chart, lab work & pertinent test results  History of Anesthesia Complications Negative for: history of anesthetic complications  Airway Mallampati: II  TM Distance: >3 FB Neck ROM: Full    Dental  (+) Dental Advisory Given, Caps   Pulmonary former smoker,    breath sounds clear to auscultation       Cardiovascular negative cardio ROS   Rhythm:Regular Rate:Normal     Neuro/Psych Anxiety Depression    GI/Hepatic negative GI ROS, Neg liver ROS,   Endo/Other    Renal/GU Nephrolithiasis, renal mass     Musculoskeletal negative musculoskeletal ROS (+)   Abdominal   Peds  Hematology negative hematology ROS (+)   Anesthesia Other Findings   Reproductive/Obstetrics                             Anesthesia Physical Anesthesia Plan  ASA: II  Anesthesia Plan: General   Post-op Pain Management:    Induction: Intravenous  Airway Management Planned: Oral ETT  Additional Equipment:   Intra-op Plan:   Post-operative Plan: Extubation in OR  Informed Consent: I have reviewed the patients History and Physical, chart, labs and discussed the procedure including the risks, benefits and alternatives for the proposed anesthesia with the patient or authorized representative who has indicated his/her understanding and acceptance.   Dental advisory given  Plan Discussed with: CRNA and Surgeon  Anesthesia Plan Comments: (Plan routine monitors, GETA)        Anesthesia Quick Evaluation

## 2016-11-15 NOTE — Progress Notes (Signed)
Hgb. And Hct. And B met drawn.

## 2016-11-15 NOTE — Anesthesia Postprocedure Evaluation (Signed)
Anesthesia Post Note  Patient: Alan Mendez  Procedure(s) Performed: Procedure(s) (LRB): RIGHT XI ROBOT ASSITED LAPAROSCOPIC NEPHROURETERECTOMY (Right)  Patient location during evaluation: PACU Anesthesia Type: General Level of consciousness: awake and alert and oriented Pain management: pain level controlled Vital Signs Assessment: post-procedure vital signs reviewed and stable Respiratory status: spontaneous breathing, nonlabored ventilation, respiratory function stable and patient connected to nasal cannula oxygen Cardiovascular status: blood pressure returned to baseline and stable Postop Assessment: no signs of nausea or vomiting Anesthetic complications: no       Last Vitals:  Vitals:   11/15/16 1400 11/15/16 1415  BP: (!) 164/79 (!) 158/87  Pulse: 84 80  Resp: 20 16  Temp:      Last Pain:  Vitals:   11/15/16 1400  TempSrc:   PainSc: 0-No pain                 Damian Hofstra A.

## 2016-11-15 NOTE — Transfer of Care (Signed)
Immediate Anesthesia Transfer of Care Note  Patient: Alan Mendez  Procedure(s) Performed: Procedure(s): RIGHT XI ROBOT ASSITED LAPAROSCOPIC NEPHROURETERECTOMY (Right)  Patient Location: PACU  Anesthesia Type:General  Level of Consciousness: sedated  Airway & Oxygen Therapy: Patient Spontanous Breathing and Patient connected to face mask oxygen  Post-op Assessment: Report given to RN and Post -op Vital signs reviewed and stable  Post vital signs: Reviewed and stable  Last Vitals:  Vitals:   11/15/16 0630  BP: 125/90  Pulse: 76  Resp: 18  Temp: 36.8 C    Last Pain:  Vitals:   11/15/16 0630  TempSrc: Oral      Patients Stated Pain Goal: 5 (68/03/21 2248)  Complications: No apparent anesthesia complications

## 2016-11-15 NOTE — Anesthesia Procedure Notes (Signed)
Procedure Name: Intubation Date/Time: 11/15/2016 8:37 AM Performed by: Lind Covert Pre-anesthesia Checklist: Patient identified, Emergency Drugs available, Suction available, Patient being monitored and Timeout performed Patient Re-evaluated:Patient Re-evaluated prior to inductionOxygen Delivery Method: Circle system utilized Preoxygenation: Pre-oxygenation with 100% oxygen Intubation Type: IV induction Ventilation: Mask ventilation without difficulty Laryngoscope Size: Mac and 3 Grade View: Grade II Tube type: Oral Tube size: 7.5 mm Placement Confirmation: ETT inserted through vocal cords under direct vision,  positive ETCO2 and breath sounds checked- equal and bilateral Secured at: 22 cm Tube secured with: Tape Dental Injury: Teeth and Oropharynx as per pre-operative assessment

## 2016-11-15 NOTE — Discharge Summary (Signed)
Date of admission: 11/15/2016  Date of discharge: 11/17/2016  Admission diagnosis: Right Upper tract urothelial Cancer  Discharge diagnosis: Right Upper tract urothelial  Cancer  History and Physical: For full details, please see admission history and physical. Briefly, Alan Mendez is a 76 y.o. gentleman with Low grade high volume TCC of right renal pelvis.  After discussing management/treatment options, he elected to proceed with surgical treatment.  Hospital Course: Alan Mendez was taken to the operating room on 11/15/2016 and underwent a robotic assisted laparoscopic right nephroureterectomy. He tolerated this procedure well and without complications. Postoperatively, he was able to be transferred to a regular hospital room following recovery from anesthesia.  He was able to begin ambulating the night of surgery. He remained hemodynamically stable overnight.  He had excellent urine output with appropriately minimal output from his pelvic drain and his pelvic drain was removed on POD #1.  He was transitioned to oral pain medication, tolerated a clear liquid diet, and had met all discharge criteria and was able to be discharged home later on POD#2.  Laboratory values:   Recent Labs  11/15/16 1440 11/16/16 0527 11/17/16 0541  HGB 14.7 12.0* 13.9  HCT 43.2 35.1* 40.6    Disposition: Home  Discharge instruction: He was instructed to be ambulatory but to refrain from heavy lifting, strenuous activity, or driving. He was instructed on urethral catheter care.  Discharge medications:   Allergies as of 11/17/2016      Reactions   Lamisil [terbinafine Hcl]    Tongue swelling      Medication List    STOP taking these medications   aspirin EC 81 MG tablet   oxybutynin 5 MG tablet Commonly known as:  DITROPAN   phenazopyridine 200 MG tablet Commonly known as:  PYRIDIUM   traMADol-acetaminophen 37.5-325 MG tablet Commonly known as:  ULTRACET   trimethoprim 100 MG tablet Commonly  known as:  TRIMPEX     TAKE these medications   ciprofloxacin 250 MG tablet Commonly known as:  CIPRO Take 1 tablet (250 mg total) by mouth 2 (two) times daily. Start day prior to catheter removal Start taking on:  11/22/2016   finasteride 5 MG tablet Commonly known as:  PROSCAR Take 1 tablet (5 mg total) by mouth daily.   multivitamin with minerals tablet Take 1 tablet by mouth daily.   Olopatadine HCl 0.2 % Soln Apply 1 drop to eye daily.   oxyCODONE 5 MG immediate release tablet Commonly known as:  Oxy IR/ROXICODONE Take 1-2 tablets (5-10 mg total) by mouth every 4 (four) hours as needed for moderate pain or severe pain.   rosuvastatin 10 MG tablet Commonly known as:  CRESTOR TAKE 1 TABLET EACH DAY.   tamsulosin 0.4 MG Caps capsule Commonly known as:  FLOMAX Take 1 capsule (0.4 mg total) by mouth daily.       Followup: He will followup in 1 week for catheter removal and to discuss his surgical pathology results.

## 2016-11-15 NOTE — Progress Notes (Signed)
Cordella Register, R.N.- 4 [place] - to look for lab results.

## 2016-11-15 NOTE — Op Note (Signed)
Preoperative diagnosis:  1. Low grade high volume TCC of right renal pelvis    Postoperative diagnosis:  1. Same   Procedure: 1. Robotic-assisted laparoscopic right nephroureterectomy  Surgeon: Ardis Hughs, MD Resident surgeon: Dr. Virginia Crews, M.D. First Asst.: Debbrah Alar, PA-C  Anesthesia: General  Complications: None  Intraoperative findings: Patient had a complex renal hilum until we were able to staple each vessel and [place]. Bladder cuff was taken in the bladder closed in 2 layers with a nice tight seal.  EBL: Minimal  Specimens: None  Indication: Alan Mendez is a 76 y.o. patient with gross hematuria with a workup that ultimately found the patient to have high volume low-grade transitional cell carcinoma of the right lower pole and renal pelvis.  After reviewing the management options for treatment, he elected to proceed with the above surgical procedure(s). We have discussed the potential benefits and risks of the procedure, side effects of the proposed treatment, the likelihood of the patient achieving the goals of the procedure, and any potential problems that might occur during the procedure or recuperation. Informed consent has been obtained.  Description of procedure:  The patient was taken to the operating room and general anesthesia was induced.  The patient was placed in the left lateral decubitus position on the beanbag flexed at the hip with all joints and bony prominences padded.  He was prepped and draped in the usual sterile fashion, and preoperative antibiotics were administered. A preoperative time-out was performed.   We began by making a small incision just lateral to the umbilicus on the patient's right abdomen taking the incision down to the fascia, incising the fascia and then gently placing an 8 mm robotic trocar under visual guidance. We then insufflated the abdomen. We then placed a second port superior and lateral just below this subcostal  margin, a third port was lower midline below the umbilicus just above the bladder, and the fourth port was in the right lower quadrant in the anterior axillary line. The assistant port was placed midline above the umbilicus, and a 12 mm port.  We began by mobilizing the patient's colon medially. We then identified the patient's gonadal vein as well as the ureter. We isolated the gonadal vein and ligated it with Weck clips. We then elevated the ureter and lifted up the lower pole to identify the hilar structures. The vein was dissected down to the vena cava and the artery was torturous but ultimately we were able to set down to its stroke as well. Both of these structures were taken with the endovascular stapler. We then freed the lateral and superior attachments including Gerona's fat in our specimen. We did spare the adrenal gland. Once the kidney had been freed from this so as an underlying structures as well as the lateral and superior attachments we focused our attention on the ureter. The ureter was taken down meticulously over the iliac vessels and into the pelvis. The bowel was then mobilized and pushed medially with our third arm, using a lap pad to help retract the bowel. With manipulation of the arms I was able to get good exposure down all the way into the patient's bladder. We got into the intramural ureter and then into the bladder superiorly at the top of the ureteral orifice. At this point I placed a 3 oh T LOC suture at the apex of the incision and used this to retract the bladder up prior to ligating the ureter at the ureteral meatus  is. Once the specimen was free was pushed up into the right upper quadrant and I then closed the bladder using the 3 oh V lock stitch in 2 separate layers. The bladder was filled up to 100 mL and no leak was noted. We then placed the specimen in a bag. We then removed the right lower quadrant port and placed in 19 [place] Blake drain securing it to the skin with nylon  suture. A drain was placed down into the pelvis in the area of dissection. Reinspection of the hilum identified no ongoing bleeding. We then undocked the robot and extended the assistant port midline incision proximally 4 cm total to extract the specimen. The midline incision was then reapproximated at the fascial level with 0 PDS starting in each corner. The wound was then washed out and closed in a subcuticular fashion using 4-0 Monocryl. Prior to closure all of the incisions were injected with X Perella. All the trocar incisions as well as the extraction incision were closed with 4-0 Monocryl in a subcuticular fashion. Dermabond was applied over the incisions. The patient was subsequently rolled back into the supine position, extubated, and returned to the PACU in excellent condition.  Ardis Hughs, M.D.

## 2016-11-16 LAB — BASIC METABOLIC PANEL
Anion gap: 10 (ref 5–15)
BUN: 20 mg/dL (ref 6–20)
CALCIUM: 8.2 mg/dL — AB (ref 8.9–10.3)
CHLORIDE: 101 mmol/L (ref 101–111)
CO2: 26 mmol/L (ref 22–32)
CREATININE: 1.38 mg/dL — AB (ref 0.61–1.24)
GFR calc Af Amer: 57 mL/min — ABNORMAL LOW (ref >60)
GFR calc non Af Amer: 49 mL/min — ABNORMAL LOW (ref >60)
Glucose, Bld: 106 mg/dL — ABNORMAL HIGH (ref 65–99)
Potassium: 4.2 mmol/L (ref 3.5–5.1)
SODIUM: 137 mmol/L (ref 135–145)

## 2016-11-16 LAB — HEMOGLOBIN AND HEMATOCRIT, BLOOD
HCT: 35.1 % — ABNORMAL LOW (ref 39.0–52.0)
HEMOGLOBIN: 12 g/dL — AB (ref 13.0–17.0)

## 2016-11-16 MED ORDER — CIPROFLOXACIN HCL 250 MG PO TABS: 250.0000 mg | ORAL_TABLET | Freq: Two times a day (BID) | ORAL | 0 refills | Status: AC

## 2016-11-16 MED ORDER — DIAZEPAM 5 MG PO TABS
10.0000 mg | ORAL_TABLET | Freq: Once | ORAL | Status: AC
  Administered 2016-11-16: 10 mg via ORAL
  Filled 2016-11-16: qty 2

## 2016-11-16 NOTE — Plan of Care (Addendum)
Problem: Pain Managment: Goal: General experience of comfort will improve Outcome: Progressing .  Problem: Physical Regulation: Goal: Ability to maintain clinical measurements within normal limits will improve Outcome: Completed/Met Date Met: 11/16/16 . Goal: Will remain free from infection .  Problem: Activity: Goal: Risk for activity intolerance will decrease Outcome: Progressing .  Problem: Nutrition: Goal: Adequate nutrition will be maintained Outcome: Progressing .  Problem: Bowel/Gastric: Goal: Will not experience complications related to bowel motility Outcome: Progressing  Education done on foley care, emptying of bag and changing to leg bag.   Marland Kitchen

## 2016-11-16 NOTE — Care Management Note (Signed)
Case Management Note  Patient Details  Name: KAIRE STARY MRN: [id number] Date of Birth: Sep 18, 1941  Subjective/Objective:[facility] chosen for [facility] if MD places order for HHRN-f/c instruction, & management(await specific f/c orders). [facility] rep Kim aware, & following.                   Action/Plan:d/c home w/HHC.   Expected Discharge Date:                  Expected Discharge Plan:  [place]  In-House Referral:     Discharge planning Services  CM Consult  Post Acute Care Choice:    Choice offered to:  Patient  DME Arranged:    DME Agency:     HH Arranged:  RN [place] Agency:     Status of Service:  In process, will continue to follow  If discussed at Long Length of Stay Meetings, dates discussed:    Additional Comments:  Dessa Phi, RN 11/16/2016, 2:32 PM

## 2016-11-16 NOTE — Progress Notes (Signed)
1 Day Post-Op Subjective: The patient is doing well.  No nausea or vomiting. Pain is adequately controlled. Excellent UOP (1.5L) with only 37m from drain. Ambulated yesterday, no flatus yet.  Objective: Vital signs in last 24 hours: Temp:  [97.6 F (36.4 C)-98.6 F (37 C)] 98.5 F (36.9 C) (01/04 0206) Pulse Rate:  [62-84] 62 (01/04 0206) Resp:  [13-20] 16 (01/04 0206) BP: (113-164)/(58-95) 123/59 (01/04 0206) SpO2:  [95 %-100 %] 95 % (01/04 0206) Weight:  [63.5 kg (140 lb)] 63.5 kg (140 lb) (01/03 0713)  Intake/Output from previous day: 01/03 0701 - 01/04 0700 In: 3825 [I.V.:3525; IV Piggyback:300] Out: 14709[Urine:1550; Drains:74; Blood:100] Intake/Output this shift: Total I/O In: 700 [I.V.:500; IV Piggyback:200] Out: 864 [Urine:800; Drains:64]  Physical Exam:  General: Alert and oriented. CV: RRR Lungs: normal work of breathing GI: Soft, Nondistended, appropriately tender Incisions: Clean, dry, and intact. JP with small amount of SS fluid Urine: Clear Extremities: Nontender, no erythema, no edema.  Lab Results:  Recent Labs  11/15/16 1440  HGB 14.7  HCT 43.2      Assessment/Plan: POD# 1 s/p Robotic-assisted laparoscopic right nephroureterectomy  1) SL IVF 2) Ambulate, Incentive spirometry 3) Transition to oral pain medication 4) Follow up Hct and BMP 5) D/C pelvic drain tomorrow AM 6) Plan for likely discharge tomorrow    LOS: 1 day   TLolita Rieger1/02/2017, 6:30 AM

## 2016-11-17 LAB — BASIC METABOLIC PANEL
Anion gap: 13 (ref 5–15)
BUN: 18 mg/dL (ref 6–20)
CALCIUM: 8.7 mg/dL — AB (ref 8.9–10.3)
CO2: 26 mmol/L (ref 22–32)
CREATININE: 1.36 mg/dL — AB (ref 0.61–1.24)
Chloride: 100 mmol/L — ABNORMAL LOW (ref 101–111)
GFR calc Af Amer: 58 mL/min — ABNORMAL LOW (ref >60)
GFR, EST NON AFRICAN AMERICAN: 50 mL/min — AB (ref >60)
GLUCOSE: 101 mg/dL — AB (ref 65–99)
Potassium: 3.7 mmol/L (ref 3.5–5.1)
Sodium: 139 mmol/L (ref 135–145)

## 2016-11-17 LAB — HEMOGLOBIN AND HEMATOCRIT, BLOOD
HEMATOCRIT: 40.6 % (ref 39.0–52.0)
HEMOGLOBIN: 13.9 g/dL (ref 13.0–17.0)

## 2016-11-17 MED ORDER — ACETAMINOPHEN 10 MG/ML IV SOLN
1000.0000 mg | Freq: Once | INTRAVENOUS | Status: AC
  Administered 2016-11-17: 1000 mg via INTRAVENOUS
  Filled 2016-11-17: qty 100

## 2016-11-17 MED ORDER — BISACODYL 10 MG RE SUPP
10.0000 mg | Freq: Once | RECTAL | Status: AC
  Administered 2016-11-17: 10 mg via RECTAL
  Filled 2016-11-17: qty 1

## 2016-11-17 MED ORDER — OXYCODONE HCL 5 MG PO TABS: 5.0000 mg | ORAL_TABLET | ORAL | 0 refills | Status: DC | PRN

## 2016-11-17 MED ORDER — ACETAMINOPHEN 500 MG PO TABS
1000.0000 mg | ORAL_TABLET | Freq: Four times a day (QID) | ORAL | Status: DC
  Administered 2016-11-17 (×2): 1000 mg via ORAL
  Filled 2016-11-17 (×2): qty 2

## 2016-11-17 MED ORDER — HYDRALAZINE HCL 20 MG/ML IJ SOLN
5.0000 mg | Freq: Once | INTRAMUSCULAR | Status: AC
  Administered 2016-11-17: 5 mg via INTRAVENOUS
  Filled 2016-11-17: qty 1

## 2016-11-17 MED ORDER — DIAZEPAM 5 MG PO TABS
5.0000 mg | ORAL_TABLET | Freq: Three times a day (TID) | ORAL | Status: DC | PRN
Start: 2016-11-17 — End: 2016-11-17

## 2016-11-17 MED ORDER — KETOROLAC TROMETHAMINE 15 MG/ML IJ SOLN
15.0000 mg | Freq: Once | INTRAMUSCULAR | Status: AC
  Administered 2016-11-17: 15 mg via INTRAVENOUS
  Filled 2016-11-17: qty 1

## 2016-11-17 MED ORDER — SIMETHICONE 80 MG PO CHEW: 80.0000 mg | CHEWABLE_TABLET | Freq: Four times a day (QID) | ORAL | Status: DC | PRN

## 2016-11-17 NOTE — Care Management Note (Signed)
Case Management Note  Patient Details  Name: Alan Mendez MRN: [id number] Date of Birth: 29-Jan-1941  Subjective/Objective: d/c home. No orders or needs. No further CM needs.                   Action/Plan:d/c home.   Expected Discharge Date:                  Expected Discharge Plan:  Home/Self Care  In-House Referral:     Discharge planning Services  CM Consult  Post Acute Care Choice:    Choice offered to:  Patient  DME Arranged:    DME Agency:     HH Arranged:  NA HH Agency:     Status of Service:  Completed, signed off  If discussed at [place] of Stay Meetings, dates discussed:    Additional Comments:  Dessa Phi, RN 11/17/2016, 12:24 PM

## 2016-11-17 NOTE — Progress Notes (Signed)
Pt complaining of severe pain in abdomen, blood pressure elevated, states he is just "very uncomfortable." Jimmey Ralph, NP at [facility] notified. Instructed to give pain medication, which was completed.   2320- Pt still complaining of sharp pain in abdomen. NP paged again. Verbal order for '10mg'$  valium placed and given.  0045- Pt states "hardly any pain whatsoever, I guess it was just stress related." Patient stable and comfortable.  Will continue to monitor patient closely.

## 2016-11-17 NOTE — Progress Notes (Signed)
2 Days Post-Op Subjective: More gas pain or spasms overnight, responded to valium. Excellent UOP (1.8L). Ambulated yesterday, no flatus yet. Creatinine stable at 1.3, Hb up to 13.9 from 12.  Objective: Vital signs in last 24 hours: Temp:  [98.8 F (37.1 C)-100 F (37.8 C)] 99.8 F (37.7 C) (01/05 0631) Pulse Rate:  [59-82] 82 (01/05 0534) Resp:  [18] 18 (01/04 1648) BP: (125-183)/(70-84) 162/82 (01/05 0534) SpO2:  [91 %-95 %] 91 % (01/05 0534)  Intake/Output from previous day: 01/04 0701 - 01/05 0700 In: 420 [P.O.:420] Out: 1825 [Urine:1825] Intake/Output this shift: Total I/O In: 420 [P.O.:420] Out: 825 [Urine:825]  Physical Exam:  General: Alert and oriented. CV: RRR Lungs: normal work of breathing GI: Soft, mildly distended, appropriately tender Incisions: Clean, dry, and intact  Urine: Clear Extremities: Nontender, no erythema, no edema.  Lab Results:  Recent Labs  11/15/16 1440 11/16/16 0527 11/17/16 0541  HGB 14.7 12.0* 13.9  HCT 43.2 35.1* 40.6      Assessment/Plan: POD# 1 s/p Robotic-assisted laparoscopic right nephroureterectomy  1) SL IVF 2) Ambulate, Incentive spirometry 3) Add simethicone PRN to oral pain medication 4) Suppository this AM 5) Plan for likely discharge later today    LOS: 2 days   Alan Mendez 11/17/2016, 6:40 AM

## 2016-11-19 DIAGNOSIS — F419 Anxiety disorder, unspecified: Secondary | ICD-10-CM | POA: Diagnosis not present

## 2016-11-19 DIAGNOSIS — D3011 Benign neoplasm of right renal pelvis: Secondary | ICD-10-CM | POA: Diagnosis not present

## 2016-11-19 DIAGNOSIS — N400 Enlarged prostate without lower urinary tract symptoms: Secondary | ICD-10-CM | POA: Diagnosis not present

## 2016-11-19 DIAGNOSIS — E785 Hyperlipidemia, unspecified: Secondary | ICD-10-CM | POA: Diagnosis not present

## 2016-11-19 DIAGNOSIS — Z483 Aftercare following surgery for neoplasm: Secondary | ICD-10-CM | POA: Diagnosis not present

## 2016-11-19 DIAGNOSIS — F329 Major depressive disorder, single episode, unspecified: Secondary | ICD-10-CM | POA: Diagnosis not present

## 2016-11-21 DIAGNOSIS — N400 Enlarged prostate without lower urinary tract symptoms: Secondary | ICD-10-CM | POA: Diagnosis not present

## 2016-11-21 DIAGNOSIS — Z483 Aftercare following surgery for neoplasm: Secondary | ICD-10-CM | POA: Diagnosis not present

## 2016-11-21 DIAGNOSIS — F419 Anxiety disorder, unspecified: Secondary | ICD-10-CM | POA: Diagnosis not present

## 2016-11-21 DIAGNOSIS — F329 Major depressive disorder, single episode, unspecified: Secondary | ICD-10-CM | POA: Diagnosis not present

## 2016-11-21 DIAGNOSIS — D3011 Benign neoplasm of right renal pelvis: Secondary | ICD-10-CM | POA: Diagnosis not present

## 2016-11-21 DIAGNOSIS — E785 Hyperlipidemia, unspecified: Secondary | ICD-10-CM | POA: Diagnosis not present

## 2016-11-23 DIAGNOSIS — N400 Enlarged prostate without lower urinary tract symptoms: Secondary | ICD-10-CM | POA: Diagnosis not present

## 2016-11-23 DIAGNOSIS — D3011 Benign neoplasm of right renal pelvis: Secondary | ICD-10-CM | POA: Diagnosis not present

## 2016-11-23 DIAGNOSIS — F329 Major depressive disorder, single episode, unspecified: Secondary | ICD-10-CM | POA: Diagnosis not present

## 2016-11-23 DIAGNOSIS — F419 Anxiety disorder, unspecified: Secondary | ICD-10-CM | POA: Diagnosis not present

## 2016-11-23 DIAGNOSIS — Z483 Aftercare following surgery for neoplasm: Secondary | ICD-10-CM | POA: Diagnosis not present

## 2016-11-23 DIAGNOSIS — E785 Hyperlipidemia, unspecified: Secondary | ICD-10-CM | POA: Diagnosis not present

## 2016-11-24 DIAGNOSIS — C661 Malignant neoplasm of right ureter: Secondary | ICD-10-CM | POA: Diagnosis not present

## 2016-11-28 DIAGNOSIS — F329 Major depressive disorder, single episode, unspecified: Secondary | ICD-10-CM | POA: Diagnosis not present

## 2016-11-28 DIAGNOSIS — F419 Anxiety disorder, unspecified: Secondary | ICD-10-CM | POA: Diagnosis not present

## 2016-11-28 DIAGNOSIS — E785 Hyperlipidemia, unspecified: Secondary | ICD-10-CM | POA: Diagnosis not present

## 2016-11-28 DIAGNOSIS — D3011 Benign neoplasm of right renal pelvis: Secondary | ICD-10-CM | POA: Diagnosis not present

## 2016-11-28 DIAGNOSIS — N400 Enlarged prostate without lower urinary tract symptoms: Secondary | ICD-10-CM | POA: Diagnosis not present

## 2016-11-28 DIAGNOSIS — Z483 Aftercare following surgery for neoplasm: Secondary | ICD-10-CM | POA: Diagnosis not present

## 2016-12-11 DIAGNOSIS — E785 Hyperlipidemia, unspecified: Secondary | ICD-10-CM | POA: Diagnosis not present

## 2016-12-11 DIAGNOSIS — D3011 Benign neoplasm of right renal pelvis: Secondary | ICD-10-CM | POA: Diagnosis not present

## 2016-12-11 DIAGNOSIS — N400 Enlarged prostate without lower urinary tract symptoms: Secondary | ICD-10-CM | POA: Diagnosis not present

## 2016-12-11 DIAGNOSIS — F329 Major depressive disorder, single episode, unspecified: Secondary | ICD-10-CM | POA: Diagnosis not present

## 2016-12-11 DIAGNOSIS — F419 Anxiety disorder, unspecified: Secondary | ICD-10-CM | POA: Diagnosis not present

## 2016-12-11 DIAGNOSIS — Z483 Aftercare following surgery for neoplasm: Secondary | ICD-10-CM | POA: Diagnosis not present

## 2017-02-05 DIAGNOSIS — N509 Disorder of male genital organs, unspecified: Secondary | ICD-10-CM | POA: Diagnosis not present

## 2017-02-19 DIAGNOSIS — C661 Malignant neoplasm of right ureter: Secondary | ICD-10-CM | POA: Diagnosis not present

## 2017-02-20 DIAGNOSIS — R3129 Other microscopic hematuria: Secondary | ICD-10-CM | POA: Diagnosis not present

## 2017-02-22 DIAGNOSIS — Z125 Encounter for screening for malignant neoplasm of prostate: Secondary | ICD-10-CM | POA: Diagnosis not present

## 2017-02-22 DIAGNOSIS — E7800 Pure hypercholesterolemia, unspecified: Secondary | ICD-10-CM | POA: Diagnosis not present

## 2017-02-26 DIAGNOSIS — C651 Malignant neoplasm of right renal pelvis: Secondary | ICD-10-CM | POA: Diagnosis not present

## 2017-02-26 DIAGNOSIS — R3121 Asymptomatic microscopic hematuria: Secondary | ICD-10-CM | POA: Diagnosis not present

## 2017-02-26 DIAGNOSIS — C678 Malignant neoplasm of overlapping sites of bladder: Secondary | ICD-10-CM | POA: Diagnosis not present

## 2017-02-27 ENCOUNTER — Other Ambulatory Visit: Payer: Self-pay | Admitting: Urology

## 2017-03-01 DIAGNOSIS — M179 Osteoarthritis of knee, unspecified: Secondary | ICD-10-CM | POA: Diagnosis not present

## 2017-03-01 DIAGNOSIS — N401 Enlarged prostate with lower urinary tract symptoms: Secondary | ICD-10-CM | POA: Diagnosis not present

## 2017-03-01 DIAGNOSIS — Z0000 Encounter for general adult medical examination without abnormal findings: Secondary | ICD-10-CM | POA: Diagnosis not present

## 2017-03-01 DIAGNOSIS — Z23 Encounter for immunization: Secondary | ICD-10-CM | POA: Diagnosis not present

## 2017-03-01 DIAGNOSIS — R972 Elevated prostate specific antigen [PSA]: Secondary | ICD-10-CM | POA: Diagnosis not present

## 2017-03-01 DIAGNOSIS — C649 Malignant neoplasm of unspecified kidney, except renal pelvis: Secondary | ICD-10-CM | POA: Diagnosis not present

## 2017-03-01 DIAGNOSIS — E7800 Pure hypercholesterolemia, unspecified: Secondary | ICD-10-CM | POA: Diagnosis not present

## 2017-03-01 DIAGNOSIS — Z6824 Body mass index (BMI) 24.0-24.9, adult: Secondary | ICD-10-CM | POA: Diagnosis not present

## 2017-03-01 DIAGNOSIS — M419 Scoliosis, unspecified: Secondary | ICD-10-CM | POA: Diagnosis not present

## 2017-03-01 DIAGNOSIS — Z1389 Encounter for screening for other disorder: Secondary | ICD-10-CM | POA: Diagnosis not present

## 2017-03-06 DIAGNOSIS — Z1212 Encounter for screening for malignant neoplasm of rectum: Secondary | ICD-10-CM | POA: Diagnosis not present

## 2017-03-07 ENCOUNTER — Encounter (HOSPITAL_BASED_OUTPATIENT_CLINIC_OR_DEPARTMENT_OTHER): Payer: Self-pay | Admitting: *Deleted

## 2017-03-07 DIAGNOSIS — D225 Melanocytic nevi of trunk: Secondary | ICD-10-CM | POA: Diagnosis not present

## 2017-03-07 DIAGNOSIS — D2272 Melanocytic nevi of left lower limb, including hip: Secondary | ICD-10-CM | POA: Diagnosis not present

## 2017-03-07 DIAGNOSIS — D2271 Melanocytic nevi of right lower limb, including hip: Secondary | ICD-10-CM | POA: Diagnosis not present

## 2017-03-07 DIAGNOSIS — L814 Other melanin hyperpigmentation: Secondary | ICD-10-CM | POA: Diagnosis not present

## 2017-03-07 DIAGNOSIS — L820 Inflamed seborrheic keratosis: Secondary | ICD-10-CM | POA: Diagnosis not present

## 2017-03-07 DIAGNOSIS — D1801 Hemangioma of skin and subcutaneous tissue: Secondary | ICD-10-CM | POA: Diagnosis not present

## 2017-03-07 DIAGNOSIS — L821 Other seborrheic keratosis: Secondary | ICD-10-CM | POA: Diagnosis not present

## 2017-03-07 DIAGNOSIS — D170 Benign lipomatous neoplasm of skin and subcutaneous tissue of head, face and neck: Secondary | ICD-10-CM | POA: Diagnosis not present

## 2017-03-07 DIAGNOSIS — C44519 Basal cell carcinoma of skin of other part of trunk: Secondary | ICD-10-CM | POA: Diagnosis not present

## 2017-03-07 NOTE — Progress Notes (Signed)
NPO AFTER MN.  ARRIVE AT 1100.  NEEDS HG.   WILL TAKE CRESTOR AND FLOMAX AM DOS W/ SIPS OF WATER.

## 2017-03-16 ENCOUNTER — Ambulatory Visit (HOSPITAL_BASED_OUTPATIENT_CLINIC_OR_DEPARTMENT_OTHER): Payer: Medicare Other | Admitting: Anesthesiology

## 2017-03-16 ENCOUNTER — Encounter (HOSPITAL_COMMUNITY)
Admission: RE | Disposition: A | Discharge status: Home / Self Care | Payer: Self-pay | Source: Ambulatory Visit | Attending: Urology

## 2017-03-16 ENCOUNTER — Encounter (HOSPITAL_BASED_OUTPATIENT_CLINIC_OR_DEPARTMENT_OTHER): Payer: Self-pay

## 2017-03-16 ENCOUNTER — Observation Stay (HOSPITAL_BASED_OUTPATIENT_CLINIC_OR_DEPARTMENT_OTHER)
Admission: RE | Admit: 2017-03-16 | Discharge: 2017-03-17 | Disposition: A | Discharge status: Home / Self Care | Payer: Medicare Other | Source: Ambulatory Visit | Attending: Urology | Admitting: Urology

## 2017-03-16 DIAGNOSIS — Z7982 Long term (current) use of aspirin: Secondary | ICD-10-CM | POA: Diagnosis not present

## 2017-03-16 DIAGNOSIS — C679 Malignant neoplasm of bladder, unspecified: Secondary | ICD-10-CM | POA: Diagnosis not present

## 2017-03-16 DIAGNOSIS — Z85528 Personal history of other malignant neoplasm of kidney: Secondary | ICD-10-CM | POA: Insufficient documentation

## 2017-03-16 DIAGNOSIS — Z79899 Other long term (current) drug therapy: Secondary | ICD-10-CM | POA: Diagnosis not present

## 2017-03-16 DIAGNOSIS — C641 Malignant neoplasm of right kidney, except renal pelvis: Secondary | ICD-10-CM

## 2017-03-16 DIAGNOSIS — C678 Malignant neoplasm of overlapping sites of bladder: Principal | ICD-10-CM | POA: Insufficient documentation

## 2017-03-16 DIAGNOSIS — E7800 Pure hypercholesterolemia, unspecified: Secondary | ICD-10-CM | POA: Insufficient documentation

## 2017-03-16 DIAGNOSIS — N200 Calculus of kidney: Secondary | ICD-10-CM | POA: Diagnosis not present

## 2017-03-16 DIAGNOSIS — Z87891 Personal history of nicotine dependence: Secondary | ICD-10-CM | POA: Insufficient documentation

## 2017-03-16 DIAGNOSIS — N401 Enlarged prostate with lower urinary tract symptoms: Secondary | ICD-10-CM | POA: Diagnosis not present

## 2017-03-16 DIAGNOSIS — Z905 Acquired absence of kidney: Secondary | ICD-10-CM | POA: Diagnosis not present

## 2017-03-16 DIAGNOSIS — N138 Other obstructive and reflux uropathy: Secondary | ICD-10-CM | POA: Diagnosis not present

## 2017-03-16 HISTORY — DX: Personal history of other malignant neoplasm of kidney: Z85.528

## 2017-03-16 HISTORY — DX: Malignant neoplasm of bladder, unspecified: C67.9

## 2017-03-16 HISTORY — PX: CYSTOSCOPY W/ RETROGRADES: SHX1426

## 2017-03-16 HISTORY — PX: TRANSURETHRAL RESECTION OF BLADDER TUMOR WITH MITOMYCIN-C: SHX6459

## 2017-03-16 HISTORY — DX: Acquired absence of kidney: Z90.5

## 2017-03-16 LAB — POCT HEMOGLOBIN-HEMACUE: HEMOGLOBIN: 15.1 g/dL (ref 13.0–17.0)

## 2017-03-16 SURGERY — TRANSURETHRAL RESECTION OF BLADDER TUMOR WITH MITOMYCIN-C
Anesthesia: General | Site: Ureter | Laterality: Left

## 2017-03-16 MED ORDER — MIDAZOLAM HCL 2 MG/2ML IJ SOLN
INTRAMUSCULAR | Status: AC
  Filled 2017-03-16: qty 2

## 2017-03-16 MED ORDER — HYDRALAZINE HCL 20 MG/ML IJ SOLN
10.0000 mg | Freq: Once | INTRAMUSCULAR | Status: AC
  Administered 2017-03-16: 10 mg via INTRAVENOUS
  Filled 2017-03-16: qty 0.5

## 2017-03-16 MED ORDER — FUROSEMIDE 10 MG/ML IJ SOLN
INTRAMUSCULAR | Status: AC
  Filled 2017-03-16: qty 4

## 2017-03-16 MED ORDER — PROPOFOL 10 MG/ML IV BOLUS
INTRAVENOUS | Status: DC | PRN
  Administered 2017-03-16: 150 mg via INTRAVENOUS

## 2017-03-16 MED ORDER — MIDAZOLAM HCL 5 MG/5ML IJ SOLN
INTRAMUSCULAR | Status: DC | PRN
  Administered 2017-03-16: 2 mg via INTRAVENOUS

## 2017-03-16 MED ORDER — FENTANYL CITRATE (PF) 100 MCG/2ML IJ SOLN
INTRAMUSCULAR | Status: DC | PRN
  Administered 2017-03-16 (×10): 25 ug via INTRAVENOUS
  Administered 2017-03-16: 50 ug via INTRAVENOUS

## 2017-03-16 MED ORDER — ROSUVASTATIN CALCIUM 10 MG PO TABS
10.0000 mg | ORAL_TABLET | Freq: Every day | ORAL | Status: DC
  Administered 2017-03-16 – 2017-03-17 (×2): 10 mg via ORAL
  Filled 2017-03-16 (×2): qty 1

## 2017-03-16 MED ORDER — EPHEDRINE SULFATE 50 MG/ML IJ SOLN
INTRAMUSCULAR | Status: DC | PRN
  Administered 2017-03-16 (×2): 10 mg via INTRAVENOUS

## 2017-03-16 MED ORDER — ACETAMINOPHEN 500 MG PO TABS: 1000.0000 mg | ORAL_TABLET | Freq: Four times a day (QID) | ORAL | Status: DC | PRN

## 2017-03-16 MED ORDER — LIDOCAINE 2% (20 MG/ML) 5 ML SYRINGE
INTRAMUSCULAR | Status: AC
  Filled 2017-03-16: qty 5

## 2017-03-16 MED ORDER — DOCUSATE SODIUM 100 MG PO CAPS
100.0000 mg | ORAL_CAPSULE | Freq: Two times a day (BID) | ORAL | Status: DC
  Administered 2017-03-17: 100 mg via ORAL
  Filled 2017-03-16 (×2): qty 1

## 2017-03-16 MED ORDER — BELLADONNA ALKALOIDS-OPIUM 16.2-60 MG RE SUPP
RECTAL | Status: DC | PRN
  Administered 2017-03-16: 1 via RECTAL

## 2017-03-16 MED ORDER — FENTANYL CITRATE (PF) 100 MCG/2ML IJ SOLN
INTRAMUSCULAR | Status: AC
  Filled 2017-03-16: qty 2

## 2017-03-16 MED ORDER — LIDOCAINE 2% (20 MG/ML) 5 ML SYRINGE
INTRAMUSCULAR | Status: DC | PRN
  Administered 2017-03-16: 80 mg via INTRAVENOUS

## 2017-03-16 MED ORDER — PROPOFOL 10 MG/ML IV BOLUS
INTRAVENOUS | Status: AC
  Filled 2017-03-16: qty 20

## 2017-03-16 MED ORDER — TRAMADOL HCL 50 MG PO TABS: 50.0000 mg | ORAL_TABLET | Freq: Four times a day (QID) | ORAL | 0 refills | Status: DC | PRN

## 2017-03-16 MED ORDER — KETOROLAC TROMETHAMINE 30 MG/ML IJ SOLN
INTRAMUSCULAR | Status: DC | PRN
  Administered 2017-03-16: 30 mg via INTRAVENOUS

## 2017-03-16 MED ORDER — MITOMYCIN CHEMO FOR BLADDER INSTILLATION 40 MG
40.0000 mg | Freq: Once | INTRAVENOUS | Status: DC
  Filled 2017-03-16: qty 40

## 2017-03-16 MED ORDER — CEFAZOLIN SODIUM-DEXTROSE 2-4 GM/100ML-% IV SOLN
INTRAVENOUS | Status: AC
  Filled 2017-03-16: qty 100

## 2017-03-16 MED ORDER — ONDANSETRON HCL 4 MG/2ML IJ SOLN
INTRAMUSCULAR | Status: AC
  Filled 2017-03-16: qty 2

## 2017-03-16 MED ORDER — LACTATED RINGERS IV SOLN
INTRAVENOUS | Status: DC
  Administered 2017-03-16 (×4): via INTRAVENOUS
  Filled 2017-03-16: qty 1000

## 2017-03-16 MED ORDER — ZOLPIDEM TARTRATE 5 MG PO TABS
5.0000 mg | ORAL_TABLET | Freq: Every evening | ORAL | Status: DC | PRN
  Administered 2017-03-16: 5 mg via ORAL
  Filled 2017-03-16: qty 1

## 2017-03-16 MED ORDER — STERILE WATER FOR IRRIGATION IR SOLN
Status: DC | PRN
  Administered 2017-03-16: 12000 mL
  Administered 2017-03-16 (×2): 3000 mL
  Administered 2017-03-16: 12000 mL

## 2017-03-16 MED ORDER — FUROSEMIDE 10 MG/ML IJ SOLN
20.0000 mg | Freq: Once | INTRAMUSCULAR | Status: AC
  Administered 2017-03-16: 20 mg via INTRAVENOUS
  Filled 2017-03-16: qty 2

## 2017-03-16 MED ORDER — ONDANSETRON HCL 4 MG/2ML IJ SOLN
INTRAMUSCULAR | Status: DC | PRN
  Administered 2017-03-16: 4 mg via INTRAVENOUS

## 2017-03-16 MED ORDER — FENTANYL CITRATE (PF) 100 MCG/2ML IJ SOLN
25.0000 ug | INTRAMUSCULAR | Status: DC | PRN
  Administered 2017-03-16: 50 ug via INTRAVENOUS
  Filled 2017-03-16: qty 1

## 2017-03-16 MED ORDER — LACTATED RINGERS IV SOLN
INTRAVENOUS | Status: DC
  Administered 2017-03-17: 03:00:00 via INTRAVENOUS

## 2017-03-16 MED ORDER — FLUORESCEIN SODIUM 10 % IV SOLN
INTRAVENOUS | Status: DC | PRN
  Administered 2017-03-16: 50 mg via INTRAVENOUS

## 2017-03-16 MED ORDER — FLUORESCEIN SODIUM 10 % IV SOLN
INTRAVENOUS | Status: AC
  Filled 2017-03-16: qty 5

## 2017-03-16 MED ORDER — BELLADONNA ALKALOIDS-OPIUM 16.2-60 MG RE SUPP
RECTAL | Status: AC
  Filled 2017-03-16: qty 1

## 2017-03-16 MED ORDER — KETOROLAC TROMETHAMINE 30 MG/ML IJ SOLN
INTRAMUSCULAR | Status: AC
  Filled 2017-03-16: qty 1

## 2017-03-16 MED ORDER — DEXAMETHASONE SODIUM PHOSPHATE 10 MG/ML IJ SOLN
INTRAMUSCULAR | Status: AC
  Filled 2017-03-16: qty 1

## 2017-03-16 MED ORDER — BELLADONNA ALKALOIDS-OPIUM 16.2-60 MG RE SUPP: 1.0000 | Freq: Three times a day (TID) | RECTAL | Status: DC | PRN

## 2017-03-16 MED ORDER — ONDANSETRON HCL 4 MG PO TABS: 4.0000 mg | ORAL_TABLET | Freq: Three times a day (TID) | ORAL | Status: DC | PRN

## 2017-03-16 MED ORDER — SODIUM CHLORIDE 0.9 % IR SOLN
Status: DC | PRN
  Administered 2017-03-16: 3000 mL

## 2017-03-16 MED ORDER — HYDRALAZINE HCL 20 MG/ML IJ SOLN: 5.0000 mg | INTRAMUSCULAR | Status: DC | PRN

## 2017-03-16 MED ORDER — TAMSULOSIN HCL 0.4 MG PO CAPS
0.4000 mg | ORAL_CAPSULE | Freq: Every day | ORAL | Status: DC
  Administered 2017-03-17: 0.4 mg via ORAL
  Filled 2017-03-16: qty 1

## 2017-03-16 MED ORDER — CEFAZOLIN SODIUM-DEXTROSE 2-4 GM/100ML-% IV SOLN
2.0000 g | INTRAVENOUS | Status: AC
  Administered 2017-03-16: 2 g via INTRAVENOUS
  Filled 2017-03-16: qty 100

## 2017-03-16 MED ORDER — FUROSEMIDE 10 MG/ML IJ SOLN
40.0000 mg | Freq: Once | INTRAMUSCULAR | Status: AC
  Administered 2017-03-16: 40 mg via INTRAVENOUS
  Filled 2017-03-16: qty 4

## 2017-03-16 MED ORDER — HYDRALAZINE HCL 20 MG/ML IJ SOLN
INTRAMUSCULAR | Status: AC
  Filled 2017-03-16: qty 1

## 2017-03-16 MED ORDER — DEXAMETHASONE SODIUM PHOSPHATE 4 MG/ML IJ SOLN
INTRAMUSCULAR | Status: DC | PRN
  Administered 2017-03-16: 10 mg via INTRAVENOUS

## 2017-03-16 SURGICAL SUPPLY — 42 items
BAG DRAIN URO-CYSTO SKYTR STRL (DRAIN) ×3 IMPLANT
BAG URINE DRAINAGE (UROLOGICAL SUPPLIES) ×3 IMPLANT
BASKET DAKOTA 1.9FR 11X120 (BASKET) IMPLANT
BASKET LASER NITINOL 1.9FR (BASKET) IMPLANT
BASKET STNLS GEMINI 4WIRE 3FR (BASKET) IMPLANT
BASKET ZERO TIP NITINOL 2.4FR (BASKET) IMPLANT
CATH FOLEY 2WAY SLVR  5CC 18FR (CATHETERS) ×2
CATH FOLEY 2WAY SLVR 5CC 18FR (CATHETERS) ×1 IMPLANT
CATH FOLEY 3WAY 30CC 22FR (CATHETERS) ×3 IMPLANT
CATH URET 5FR 28IN OPEN ENDED (CATHETERS) ×3 IMPLANT
CATH URET DUAL LUMEN 6-10FR 50 (CATHETERS) IMPLANT
CLOTH BEACON ORANGE TIMEOUT ST (SAFETY) ×3 IMPLANT
ELECT REM PT RETURN 9FT ADLT (ELECTROSURGICAL) ×3
ELECTRODE REM PT RTRN 9FT ADLT (ELECTROSURGICAL) ×1 IMPLANT
EVACUATOR MICROVAS BLADDER (UROLOGICAL SUPPLIES) IMPLANT
FIBER LASER TRAC TIP (UROLOGICAL SUPPLIES) IMPLANT
GAUZE SPONGE 4X4 12PLY STRL LF (GAUZE/BANDAGES/DRESSINGS) ×3 IMPLANT
GLOVE BIO SURGEON STRL SZ 6.5 (GLOVE) ×4 IMPLANT
GLOVE BIO SURGEON STRL SZ7.5 (GLOVE) ×3 IMPLANT
GLOVE BIO SURGEONS STRL SZ 6.5 (GLOVE) ×2
GLOVE BIOGEL PI IND STRL 7.5 (GLOVE) ×2 IMPLANT
GLOVE BIOGEL PI INDICATOR 7.5 (GLOVE) ×4
GOWN STRL REUS W/ TWL XL LVL3 (GOWN DISPOSABLE) ×2 IMPLANT
GOWN STRL REUS W/TWL XL LVL3 (GOWN DISPOSABLE) ×7 IMPLANT
GUIDEWIRE 0.038 PTFE COATED (WIRE) IMPLANT
GUIDEWIRE ANG ZIPWIRE 038X150 (WIRE) IMPLANT
GUIDEWIRE STR DUAL SENSOR (WIRE) ×3 IMPLANT
HOLDER FOLEY CATH W/STRAP (MISCELLANEOUS) ×3 IMPLANT
IV NS IRRIG 3000ML ARTHROMATIC (IV SOLUTION) ×12 IMPLANT
KIT BALLIN UROMAX 15FX10 (LABEL) IMPLANT
KIT BALLN UROMAX 15FX4 (MISCELLANEOUS) IMPLANT
KIT BALLN UROMAX 26 75X4 (MISCELLANEOUS)
KIT RM TURNOVER CYSTO AR (KITS) ×3 IMPLANT
MANIFOLD NEPTUNE II (INSTRUMENTS) IMPLANT
NS IRRIG 500ML POUR BTL (IV SOLUTION) ×3 IMPLANT
PACK CYSTO (CUSTOM PROCEDURE TRAY) ×3 IMPLANT
SET HIGH PRES BAL DIL (LABEL)
SHEATH ACCESS URETERAL 38CM (SHEATH) IMPLANT
STENT URET 6FRX24 CONTOUR (STENTS) ×3 IMPLANT
SYR 30ML LL (SYRINGE) IMPLANT
TUBE CONNECTING 12'X1/4 (SUCTIONS) ×1
TUBE CONNECTING 12X1/4 (SUCTIONS) ×2 IMPLANT

## 2017-03-16 NOTE — Progress Notes (Signed)
Spoke with Dr Lyndle Herrlich concerning BP-190/109-181/113 since arrival into PACU -Pt uncomfortable yet sedated-pain medication given for bladder discomfort.Since no history of hypertension instructed to monitor for thirty more minutes-follow up with Dr Tobias Alexander as needed if no improvement

## 2017-03-16 NOTE — Transfer of Care (Signed)
Immediate Anesthesia Transfer of Care Note  Patient: Alan Mendez  Procedure(s) Performed: Procedure(s) (LRB): CYSTOSCOPY BIOPSIES OF BLADDER TUMOR WITH FULGURATION (Left) CYSTOSCOPY WITH RETROGRADE PYELOGRAM (Left)  Patient Location: PACU  Anesthesia Type: General  Level of Consciousness: awake, sedated, patient cooperative and responds to stimulation  Airway & Oxygen Therapy: Patient Spontanous Breathing and Patient connected to NCO2  Post-op Assessment: Report given to PACU RN, Post -op Vital signs reviewed and stable and Patient moving all extremities  Post vital signs: Reviewed and stable  Complications: No apparent anesthesia complications

## 2017-03-16 NOTE — Op Note (Signed)
Preoperative diagnosis:  1. Recurrent bladder cancer, multifocal-overlapping sites   Postoperative diagnosis:  1. Same   Procedure: 1. Transurethral resection of bladder tumor, 0-2 cm x at least 25 small bladder tumors. 2. Left  retrograde pyelogram with interpretation  Surgeon: Ardis Hughs, MD  Anesthesia: General  Complications: None  Intraoperative findings:  #1: The patient had at least 25 small bladder tumors measuring between 1 and 10 mm, one of the tumors was overlying the patient's left ureteral orifice which I was able to remove the cold cup forceps but didn't fulgurate. The left ureteral orifice was involved in this and as such decision at this time was made to place a stent. In places, the resection was thin, but there was no overt evidence of perforation. However, there was some ongoing bleeding from the bladder neck, a Foley catheter was placed because of the bleeding. #2: 10 mL of Omnipaque contrast was instilled into the patient's left ureteral orifice through a 5 [place] open end ureteral catheter which demonstrated a normal caliber ureter with no evidence of filling defects. The calyces were sharp as well as no hydronephrosis.  EBL: 100  Specimens: multifocal bladder lesions  Indication: Alan Mendez is a 76 y.o. patient with  history of low-grade non-muscle invasive right upper tract urothelial carcinoma and is status post right nephroureterectomy.  During his routine surveillance cystoscopy was noted to have numerous low-grade appearing recurrent bladder tumors. After reviewing the management options for treatment, he elected to proceed with the above surgical procedure(s). We have discussed the potential benefits and risks of the procedure, side effects of the proposed treatment, the likelihood of the patient achieving the goals of the procedure, and any potential problems that might occur during the procedure or recuperation. Informed consent has been  obtained.  Description of procedure:  The patient was taken to the operating room and general anesthesia was induced.  The patient was placed in the dorsal lithotomy position, prepped and draped in the usual sterile fashion, and preoperative antibiotics were administered. A preoperative time-out was performed.  A 30 21 French cystoscope was gently passed to the patient's urethra into the bladder under visual guidance. 360 cystoscopic evaluation was then performed. The patient was noted to have small bladder tumors that were studying the urothelium along the lateral walls bilaterally the posterior wall, and the anterior wall. There were several tumors at the bladder neck as well. 1 tumor in particular was overlying the left ureteral orifice.  Using the cold cup biopsy forceps I removed all the bladders that I could find. We then used the Bugbee cautery and fulgurated the tumor bases and the surrounding areas. There was some difficulty locating the left ureteral orifice and some floor seen was given to the patient. With floor seen, we were able to identify the left ureteral orifice and advanced the 5 French open-ended ureteral catheter into the left distal ureter. The retrograde pyelogram was performed the 10 mL of IV Omnipaque contrast. The above findings were noted. I then passed a 0.038 sensor wire through the open-ended catheter and into the left renal pelvis. I then advanced a 24 cm times 6 French double-J ureteral stent over the wire and into the left kidney. Once the stent was noted to be well positioned in the kidney the wire was slowly backed out as the stent was advanced into the patient's bladder. Once the stent was at the bladder neck to wire was removed completely. Stent tether was left on.  Reinspection  of the patient's bladder noted no other identifiable lesions. There was some ongoing bleeding at the bladder neck which I fulgurated, and opted at this time to leave a Foley catheter. Stent  tether was tied to the Foley catheter.  Mitomycin C was not given to this patient given the amount of bleeding and the extent of the fulguration.  He was subsequently extubated and returned to PACU in stable condition.  Ardis Hughs, M.D.

## 2017-03-16 NOTE — Progress Notes (Signed)
Pt anxious about going home-attempted walking -" I feel so unstable"BP improved-abdomen softer ,but still distended-no c/o of sharp pain,urine clear red.page into Dr Louis Meckel regarding pt's desire to stay overnight.

## 2017-03-16 NOTE — Progress Notes (Signed)
In the PACU today the patient was noted to have gross hematuria for which she was given 2 several doses of Lasix, 60 mg total. His urine cleared. He was noted to have elevated blood pressure, which was treated with hydralazine, and his blood pressure responded nicely. However, the patient was complaining of severe distention, nausea, and "feeling lousy". He was anxious about going home. As such decision was made to admit the patient for 23 hour observation.

## 2017-03-16 NOTE — Progress Notes (Signed)
Spoke with Dr Louis Meckel -aware of bloody urine ,distended abdomen /order for lasix given-will see as soon as leaves clinic.

## 2017-03-16 NOTE — Anesthesia Preprocedure Evaluation (Addendum)
Anesthesia Evaluation  Patient identified by MRN, date of birth, ID band Patient awake    Reviewed: Allergy & Precautions, H&P , Patient's Chart, lab work & pertinent test results, reviewed documented beta blocker date and time   Airway Mallampati: II  TM Distance: >3 FB Neck ROM: full    Dental no notable dental hx.    Pulmonary former smoker,    Pulmonary exam normal breath sounds clear to auscultation       Cardiovascular  Rhythm:regular Rate:Normal     Neuro/Psych    GI/Hepatic   Endo/Other    Renal/GU      Musculoskeletal   Abdominal   Peds  Hematology   Anesthesia Other Findings   Reproductive/Obstetrics                             Anesthesia Physical Anesthesia Plan  ASA: II  Anesthesia Plan: General   Post-op Pain Management:    Induction: Intravenous  Airway Management Planned: LMA  Additional Equipment:   Intra-op Plan:   Post-operative Plan:   Informed Consent: I have reviewed the patients History and Physical, chart, labs and discussed the procedure including the risks, benefits and alternatives for the proposed anesthesia with the patient or authorized representative who has indicated his/her understanding and acceptance.   Dental Advisory Given  Plan Discussed with: CRNA and Surgeon  Anesthesia Plan Comments: ( )        Anesthesia Quick Evaluation

## 2017-03-16 NOTE — Anesthesia Procedure Notes (Signed)
Procedure Name: LMA Insertion Date/Time: 03/16/2017 1:37 PM Performed by: Justice Rocher Pre-anesthesia Checklist: Patient identified, Emergency Drugs available, Suction available and Patient being monitored Patient Re-evaluated:Patient Re-evaluated prior to inductionOxygen Delivery Method: Circle system utilized Preoxygenation: Pre-oxygenation with 100% oxygen Intubation Type: IV induction Ventilation: Mask ventilation without difficulty LMA: LMA inserted LMA Size: 4.0 Number of attempts: 1 Airway Equipment and Method: Bite block Placement Confirmation: positive ETCO2 Tube secured with: Tape Dental Injury: Teeth and Oropharynx as per pre-operative assessment

## 2017-03-16 NOTE — H&P (Signed)
f/u for transitional cell carcinoma  HPI: Alan Mendez is a 76 year-old male established patient who is here for surveillance of bladder cancer.  The patient had a right nephrouretectomy. This was removed on 11/15/2016.   Tumor Pathology: pTis (low grade) 4cm.   The patient did not have any post-operative bladder instillations.   His cystoscopy demonstrated 02/26/17 - multiple small non-invasive papillary lesions.   The patient had labs prior to his office visit today. Pertinent labs: BUN/Cr 27/1.4, LFTs- wnl.   He is not having pain in new locations. He has not had blood in his urine recently. He has not recently had unwanted weight loss.   Patient seen today for his first follow-up status post right nephroureterectomy for a 4 cm upper tract transitional cell carcinoma.   The patient was complaining of testicular pain, we spoke on the phone and I recommended that he use ice and scrotal elevation. Today, his pain is significantly better.   The patient voids well, sleeps through the night.     ALLERGIES: No Allergies    MEDICATIONS: Finasteride 5 mg tablet  Flomax 0.4 mg capsule, ext release 24 hr TAKE (1) CAPSULE DAILY.  Flomax 0.4 mg capsule, ext release 24 hr TAKE (1) CAPSULE DAILY.  Aspirin 81 MG TABS Oral  Centrum Silver TABS Oral  Crestor 10 mg tablet Oral     GU PSH: Cystoscopy - 06/29/2016 Cystoscopy Insert Stent, Right - 08/21/2016 Cystoscopy Ureteroscopy, Right - 09/25/2016, Right - 08/21/2016 ESWL - 2009 Inject For cystogram - 11/24/2016 Lap Nephro Ureterectomy, Right - 11/15/2016 Locm 300-'399Mg'$ /Ml Iodine,1Ml - 06/28/2016    NON-GU PSH: Appendectomy - 2009    GU PMH: Disorder of male genital organs, unspecified, Right, Right testicle - 02/05/2017 Ureteral Cancer, Right, The patient has TA (superficial) low-grade noninvasive transitional cell carcinoma of the right renal pelvis measuring 4 cm. He is now status post right nephroureterectomy. - 11/24/2016 Benign Neo Kidney,  Unspec - 09/04/2016 Asymptomatic microscopic hematuria - 06/29/2016 Bladder Stone - 06/29/2016 BPH w/LUTS - 06/29/2016, - 06/23/2016, Benign prostatic hyperplasia with urinary obstruction, - 04/06/2015 Calculus Ureter - 06/29/2016, Calculus of left ureter, - 2014 Kidney Stone (Stable) - 06/29/2016, Nephrolithiasis, - 04/06/2015 Renal Pelvis, Right, Neoplasm of uncertain behavior - 5/46/2703 Urinary Calculus, Unspec - 06/23/2016 Elevated PSA, Elevated prostate specific antigen (PSA) - 04/06/2015 Urinary Frequency, Urinary frequency - 04/06/2015 Dorsalgia, Unspec, Backache - 2014 Other microscopic hematuria, Microscopic hematuria - 2014 Personal Hx urinary calculi, Nephrolithiasis - 2014 Renal Cysts, Simple, Renal cyst, acquired - 2014    NON-GU PMH: Other specified postprocedural states - 09/04/2016 Encounter for general adult medical examination without abnormal findings, Encounter for preventive health examination - 2015 Personal history of other endocrine, nutritional and metabolic disease, History of hypercholesterolemia - 2014 Personal history of other mental and behavioral disorders, History of depression - 2014    FAMILY HISTORY: Cancer - Father Father Deceased At Age32 ___ - Runs In Family Heart Disease - Mother Mother Deceased At Age 43 from diabetic complicati - Runs In Family   SOCIAL HISTORY: Marital Status: Married Drinks 1 drink per week.  Drinks 4+ caffeinated drinks per day.    REVIEW OF SYSTEMS:    GU Review Male:   Patient reports get up at night to urinate and penile pain. Patient denies frequent urination, hard to postpone urination, burning/ pain with urination, leakage of urine, stream starts and stops, trouble starting your stream, have to strain to urinate , and erection problems.  Gastrointestinal (Upper):  Patient denies nausea, vomiting, and indigestion/ heartburn.  Gastrointestinal (Lower):   Patient denies diarrhea and constipation.  Constitutional:   Patient  denies fever, night sweats, weight loss, and fatigue.  Skin:   Patient denies skin rash/ lesion and itching.  Eyes:   Patient denies blurred vision and double vision.  Ears/ Nose/ Throat:   Patient denies sore throat and sinus problems.  Hematologic/Lymphatic:   Patient denies swollen glands and easy bruising.  Cardiovascular:   Patient denies leg swelling and chest pains.  Respiratory:   Patient denies shortness of breath and cough.  Endocrine:   Patient denies excessive thirst.  Musculoskeletal:   Patient denies back pain and joint pain.  Neurological:   Patient denies headaches and dizziness.  Psychologic:   Patient reports depression and anxiety.    VITAL SIGNS:      02/26/2017 08:20 AM  Weight 140 lb / 63.5 kg  Height 65 in / 165.1 cm  BP 114/50 mmHg  Pulse 50 /min  BMI 23.3 kg/m   GU PHYSICAL EXAMINATION:    Urethral Meatus: Normal size. No lesion, no wart, no discharge, no polyp. Normal location.  Penis: Circumcised, no warts, no cracks. No dorsal Peyronie's plaques, no left corporal Peyronie's plaques, no right corporal Peyronie's plaques, no scarring, no warts. No balanitis, no meatal stenosis.   MULTI-SYSTEM PHYSICAL EXAMINATION:    Constitutional: Well-nourished. No physical deformities. Normally developed. Good grooming.  Respiratory: No labored breathing, no use of accessory muscles. Clear to auscultation bilaterally  Cardiovascular: Normal temperature, normal extremity pulses, no swelling, no varicosities. Regular rate and rhythm  Gastrointestinal: Well healed incision. No mass, no tenderness, no rigidity, non obese abdomen.      PAST DATA REVIEWED:  Source Of History:  Patient  Records Review:   Pathology Reports   06/23/16 03/31/15 02/25/14 12/31/12 12/20/11 06/21/11 12/21/10 06/22/10  PSA  Total PSA 4.00 ng/dl 4.64  5.03  4.72  5.62  5.20  5.90  5.51   Free PSA  1.11  1.03  1.17  1.04  1.19  1.2  1.03   % Free PSA  '24  20  25  19  23  20  '$ 18.7      PROCEDURES:         Flexible Cystoscopy - 52000  Risks, benefits, and some of the potential complications of the procedure were discussed at length with the patient including infection, bleeding, voiding discomfort, urinary retention, fever, chills, sepsis, and others. All questions were answered. Informed consent was obtained. Antibiotic prophylaxis was given. Sterile technique and intraurethral analgesia were used.  Meatus:  Normal size. Normal location. Normal condition.  Urethra:  No strictures.  External Sphincter:  Normal.  Verumontanum:  Normal.  Prostate:  Non-obstructing. No hyperplasia.  Bladder Neck:  Small papillary lesion at the bladder neck.  Ureteral Orifices:  Normal location. Normal size. Normal shape. Effluxed clear urine.  Bladder:  10 or more small papillary lesions scattered throughout the bladder.      The lower urinary tract was carefully examined. The procedure was well-tolerated and without complications. Antibiotic instructions were given. Instructions were given to call the office immediately for bloody urine, difficulty urinating, urinary retention, painful or frequent urination, fever, chills, nausea, vomiting or other illness. The patient stated that he understood these instructions and would comply with them.         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 He is doing very well without evidence of disease currently. His renal function is stable.   PLAN:           Orders Labs Urine Culture          Schedule Return  Visit/Planned Activity: ASAP - Schedule Surgery          Document Letter(s):  Created for Patient: Clinical Summary         Notes:   The patient has recurrence within his bladder of what appears to be low grade noninvasive cancer. He has proximally 10 small areas or papillary lesions.   I recommended that we proceed to the operating room for resection of these areas and then instill mitomycin-C into the patient's bladder. I discussed the rationale for the surgery as well as the risks and the benefits. We also discussed the purpose of mitomycin-C as it reduces the risk of recurrence. We'll try to get this scheduled at the patient's convenience.

## 2017-03-16 NOTE — Discharge Instructions (Signed)
Transurethral Resection of Bladder Tumor (TURBT) or Bladder Biopsy   Definition:  Transurethral Resection of the Bladder Tumor is a surgical procedure used to diagnose and remove tumors within the bladder. TURBT is the most common treatment for early stage bladder cancer.  General instructions:     Your recent bladder surgery requires very little post hospital care but some definite precautions.  Despite the fact that no skin incisions were used, the area around the bladder incisions are raw and covered with scabs to promote healing and prevent bleeding. Certain precautions are needed to insure that the scabs are not disturbed over the next 2-4 weeks while the healing proceeds.  Because the raw surface inside your bladder and the irritating effects of urine you may expect frequency of urination and/or urgency (a stronger desire to urinate) and perhaps even getting up at night more often. This will usually resolve or improve slowly over the healing period. You may see some blood in your urine over the first 6 weeks. Do not be alarmed, even if the urine was clear for a while. Get off your feet and drink lots of fluids until clearing occurs. If you start to pass clots or don't improve call us.  Diet:  You may return to your normal diet immediately. Because of the raw surface of your bladder, alcohol, spicy foods, foods high in acid and drinks with caffeine may cause irritation or frequency and should be used in moderation. To keep your urine flowing freely and avoid constipation, drink plenty of fluids during the day (8-10 glasses). Tip: Avoid cranberry juice because it is very acidic.  Activity:  Your physical activity doesn't need to be restricted. However, if you are very active, you may see some blood in the urine. We suggest that you reduce your activity under the circumstances until the bleeding has stopped.  Bowels:  It is important to keep your bowels regular during the postoperative  period. Straining with bowel movements can cause bleeding. A bowel movement every other day is reasonable. Use a mild laxative if needed, such as milk of magnesia 2-3 tablespoons, or 2 Dulcolax tablets. Call if you continue to have problems. If you had been taking narcotics for pain, before, during or after your surgery, you may be constipated. Take a laxative if necessary.    Medication:  You should resume your pre-surgery medications unless told not to. In addition you may be given an antibiotic to prevent or treat infection. Antibiotics are not always necessary. All medication should be taken as prescribed until the bottles are finished unless you are having an unusual reaction to one of the drugs.     Call your surgeon if you experience:   1.  Fever over 101.0. 2.  Inability to urinate. 3.  Nausea and/or vomiting. 5.  Continued bleeding from  the incision. 7.  Problems related to your pain medication. 8.  Any problems and/or concerns  Indwelling Urinary Catheter Care, Adult Take good care of your catheter to keep it working and to prevent problems. How to wear your catheter Attach your catheter to your leg with tape (adhesive tape) or a leg strap. Make sure it is not too tight. If you use tape, remove any bits of tape that are already on the catheter. How to wear a drainage bag You should have:  A large overnight bag.  A small leg bag. Overnight Bag  You may wear the overnight bag at any time. Always keep the bag below the level  of your bladder but off the floor. When you sleep, put a clean plastic bag in a wastebasket. Then hang the bag inside the wastebasket. Leg Bag  Never wear the leg bag at night. Always wear the leg bag below your knee. Keep the leg bag secure with a leg strap or tape. How to care for your skin  Clean the skin around the catheter at least once every day.  Shower every day. Do not take baths.  Put creams, lotions, or ointments on your genital area  only as told by your doctor.  Do not use powders, sprays, or lotions on your genital area. How to clean your catheter and your skin 1. Wash your hands with soap and water. 2. Wet a washcloth in warm water and gentle (mild) soap. 3. Use the washcloth to clean the skin where the catheter enters your body. Clean downward and wipe away from the catheter in small circles. Do not wipe toward the catheter. 4. Pat the area dry with a clean towel. Make sure to clean off all soap. How to care for your drainage bags Empty your drainage bag when it is ?- full or at least 2-3 times a day. Replace your drainage bag once a month or sooner if it starts to smell bad or look dirty. Do not clean your drainage bag unless told by your doctor. Emptying a drainage bag   Supplies Needed  Rubbing alcohol.  Gauze pad or cotton ball.  Tape or a leg strap. Steps 1. Wash your hands with soap and water. 2. Separate (detach) the bag from your leg. 3. Hold the bag over the toilet or a clean container. Keep the bag below your hips and bladder. This stops pee (urine) from going back into the tube. 4. Open the pour spout at the bottom of the bag. 5. Empty the pee into the toilet or container. Do not let the pour spout touch any surface. 6. Put rubbing alcohol on a gauze pad or cotton ball. 7. Use the gauze pad or cotton ball to clean the pour spout. 8. Close the pour spout. 9. Attach the bag to your leg with tape or a leg strap. 10. Wash your hands. Changing a drainage bag  Supplies Needed  Alcohol wipes.  A clean drainage bag.  Adhesive tape or a leg strap. Steps 1. Wash your hands with soap and water. 2. Separate the dirty bag from your leg. 3. Pinch the rubber catheter with your fingers so that pee does not spill out. 4. Separate the catheter tube from the drainage tube where these tubes connect (at the connection valve). Do not let the tubes touch any surface. 5. Clean the end of the catheter tube with  an alcohol wipe. Use a different alcohol wipe to clean the end of the drainage tube. 6. Connect the catheter tube to the drainage tube of the clean bag. 7. Attach the new bag to the leg with adhesive tape or a leg strap. 8. Wash your hands. How to prevent infection and other problems  Never pull on your catheter or try to remove it. Pulling can damage tissue in your body.  Always wash your hands before and after touching your catheter.  If a leg strap gets wet, replace it with a dry one.  Drink enough fluids to keep your pee clear or pale yellow, or as told by your doctor.  Do not let the drainage bag or tubing touch the floor.  Wear cotton underwear.  If you are male, wipe from front to back after you poop (have a bowel movement).  Check on the catheter often to make sure it works and the tubing is not twisted. Get help if:  Your pee is cloudy.  Your pee smells unusually bad.  Your pee is not draining into the bag.  Your tube gets clogged.  Your catheter starts to leak.  Your bladder feels full. Get help right away if:  You have redness, swelling, or pain where the catheter enters your body.  You have fluid, pus, or a bad smell coming from the area where the catheter enters your body.  The area where the catheter enters your body feels warm.  You have a fever.  You have pain in your:  Stomach (abdomen).  Legs.  Lower back.  Bladder.  You see blood fill the catheter.  Your pee is pink or red.  You feel sick to your stomach (nauseous).  You throw up (vomit).  You have chills.  Your catheter gets pulled out. This information is not intended to replace advice given to you by your health care provider. Make sure you discuss any questions you have with your health care provider. Document Released: 02/24/2013 Document Revised: 09/27/2016 Document Reviewed: 04/14/2014 Elsevier Interactive Patient Education  2017 [employer].

## 2017-03-16 NOTE — Progress Notes (Signed)
Dr Alyson Ingles here,irrigated Foley with saline Several clots returned-urine thick bloody.Page to Dr Louis Meckel in place.

## 2017-03-17 DIAGNOSIS — Z87891 Personal history of nicotine dependence: Secondary | ICD-10-CM | POA: Diagnosis not present

## 2017-03-17 DIAGNOSIS — Z79899 Other long term (current) drug therapy: Secondary | ICD-10-CM | POA: Diagnosis not present

## 2017-03-17 DIAGNOSIS — C678 Malignant neoplasm of overlapping sites of bladder: Secondary | ICD-10-CM | POA: Diagnosis not present

## 2017-03-17 DIAGNOSIS — Z7982 Long term (current) use of aspirin: Secondary | ICD-10-CM | POA: Diagnosis not present

## 2017-03-17 DIAGNOSIS — E7800 Pure hypercholesterolemia, unspecified: Secondary | ICD-10-CM | POA: Diagnosis not present

## 2017-03-17 DIAGNOSIS — Z905 Acquired absence of kidney: Secondary | ICD-10-CM | POA: Diagnosis not present

## 2017-03-17 NOTE — Discharge Summary (Signed)
Date of admission: 03/16/2017  Date of discharge: 03/17/2017  Admission diagnosis: Bladder cancer  Discharge diagnosis: same, s/p TURBT, hematuria, HTN and nausea  Secondary diagnoses:  Patient Active Problem List   Diagnosis Date Noted  . Bladder cancer ([place]) 03/16/2017  . Urothelial carcinoma of kidney, right ([place]) 11/15/2016  . Elevated prostate specific antigen (PSA) 04/06/2015  . Nephrolithiasis 04/06/2015  . Urinary frequency 04/06/2015  . Benign prostatic hyperplasia with urinary obstruction 09/18/2013  . Idiopathic scoliosis 09/18/2013  . Diverticulosis 09/03/2012  . Renal cyst, acquired 03/28/2012    Procedures performed: Procedure(s): CYSTOSCOPY BIOPSIES OF BLADDER TUMOR WITH FULGURATION CYSTOSCOPY WITH RETROGRADE PYELOGRAM  History and Physical: For full details, please see admission history and physical. Briefly, Alan Mendez is a 76 y.o. year old patient with recurrent bladder cancer.  He was taken to the OR for resection of his tumors.  In the PACU he was having gross hematuria, HTN and nausea and a decision was made to admit him for inpatient observation.   Hospital Course: Patient tolerated the procedure well.  He was then transferred to the floor after an uneventful PACU stay.  His hospital course was uncomplicated.  On POD#1 he had met discharge criteria: was eating a regular diet, was up and ambulating independently,  pain was well controlled, and was ready to for discharge.   Laboratory values:   Recent Labs  03/16/17 1131  HGB 15.1   No results for input(s): NA, K, CL, CO2, GLUCOSE, BUN, CREATININE, CALCIUM in the last 72 hours. No results for input(s): LABPT, INR in the last 72 hours. No results for input(s): LABURIN in the last 72 hours. Results for orders placed or performed during the hospital encounter of 11/08/16  Urine culture     Status: None   Collection Time: 11/08/16  1:38 PM  Result Value Ref Range Status   Specimen Description URINE, CLEAN  CATCH  Final   Special Requests NONE  Final   Culture NO GROWTH Performed at [facility]   Final   Report Status 11/09/2016 FINAL  Final    Disposition: Home  Discharge instruction: The patient was instructed to be ambulatory but told to refrain from heavy lifting, strenuous activity, or driving.   Discharge medications: Allergies as of 03/17/2017      Reactions   Lamisil [terbinafine Hcl]    Tongue swelling      Medication List    TAKE these medications   ALEVE 220 MG Caps Generic drug:  Naproxen Sodium Take 220-440 mg by mouth every 12 (twelve) hours as needed (pain).   aspirin EC 81 MG tablet Take 81 mg by mouth daily.   multivitamin with minerals tablet Take 1 tablet by mouth daily.   rosuvastatin 10 MG tablet Commonly known as:  CRESTOR TAKE 1 TABLET EACH DAY. What changed:  See the new instructions.   tamsulosin 0.4 MG Caps capsule Commonly known as:  FLOMAX Take 1 capsule (0.4 mg total) by mouth daily. What changed:  when to take this   traMADol 50 MG tablet Commonly known as:  ULTRAM Take 1-2 tablets (50-100 mg total) by mouth every 6 (six) hours as needed for moderate pain.       Followup:  Follow-up Information    Ardis Hughs, MD On 03/30/2017.   Specialty:  Urology Why:  3:00pm Contact information: [place] [address] [phone]        Ardis Hughs, MD On 03/19/2017.   Specialty:  Urology Why:  10:30, Catheter removal Contact information: [place] [address] [phone]

## 2017-03-19 ENCOUNTER — Encounter (HOSPITAL_BASED_OUTPATIENT_CLINIC_OR_DEPARTMENT_OTHER): Payer: Self-pay | Admitting: Urology

## 2017-03-19 DIAGNOSIS — C678 Malignant neoplasm of overlapping sites of bladder: Secondary | ICD-10-CM | POA: Diagnosis not present

## 2017-03-19 NOTE — Anesthesia Postprocedure Evaluation (Signed)
Anesthesia Post Note  Patient: RAINEY KAHRS  Procedure(s) Performed: Procedure(s) (LRB): CYSTOSCOPY BIOPSIES OF BLADDER TUMOR WITH FULGURATION (Left) CYSTOSCOPY WITH RETROGRADE PYELOGRAM (Left)  Patient location during evaluation: PACU Anesthesia Type: General Level of consciousness: awake and alert Pain management: pain level controlled Vital Signs Assessment: post-procedure vital signs reviewed and stable Respiratory status: spontaneous breathing, nonlabored ventilation, respiratory function stable and patient connected to nasal cannula oxygen Cardiovascular status: blood pressure returned to baseline and stable Postop Assessment: no signs of nausea or vomiting Anesthetic complications: no       Last Vitals:  Vitals:   03/16/17 2000 03/17/17 0440  BP: 130/72 121/72  Pulse: 70 63  Resp: 16 16  Temp: 36.7 C 37.2 C    Last Pain:  Vitals:   03/17/17 0440  TempSrc: Oral  PainSc:                  Riccardo Dubin

## 2017-03-20 DIAGNOSIS — R3914 Feeling of incomplete bladder emptying: Secondary | ICD-10-CM | POA: Diagnosis not present

## 2017-03-20 DIAGNOSIS — R509 Fever, unspecified: Secondary | ICD-10-CM | POA: Diagnosis not present

## 2017-03-20 DIAGNOSIS — R338 Other retention of urine: Secondary | ICD-10-CM | POA: Diagnosis not present

## 2017-03-28 DIAGNOSIS — C678 Malignant neoplasm of overlapping sites of bladder: Secondary | ICD-10-CM | POA: Diagnosis not present

## 2017-04-05 DIAGNOSIS — R3121 Asymptomatic microscopic hematuria: Secondary | ICD-10-CM | POA: Diagnosis not present

## 2017-04-05 DIAGNOSIS — C678 Malignant neoplasm of overlapping sites of bladder: Secondary | ICD-10-CM | POA: Diagnosis not present

## 2017-04-11 ENCOUNTER — Other Ambulatory Visit: Payer: Self-pay | Admitting: Urology

## 2017-04-11 DIAGNOSIS — N3289 Other specified disorders of bladder: Secondary | ICD-10-CM | POA: Diagnosis not present

## 2017-04-11 DIAGNOSIS — C678 Malignant neoplasm of overlapping sites of bladder: Secondary | ICD-10-CM | POA: Diagnosis not present

## 2017-04-11 DIAGNOSIS — N3090 Cystitis, unspecified without hematuria: Secondary | ICD-10-CM | POA: Diagnosis not present

## 2017-04-24 DIAGNOSIS — R3121 Asymptomatic microscopic hematuria: Secondary | ICD-10-CM | POA: Diagnosis not present

## 2017-04-24 DIAGNOSIS — C678 Malignant neoplasm of overlapping sites of bladder: Secondary | ICD-10-CM | POA: Diagnosis not present

## 2017-04-24 DIAGNOSIS — C651 Malignant neoplasm of right renal pelvis: Secondary | ICD-10-CM | POA: Diagnosis not present

## 2017-04-26 DIAGNOSIS — B965 Pseudomonas (aeruginosa) (mallei) (pseudomallei) as the cause of diseases classified elsewhere: Secondary | ICD-10-CM | POA: Diagnosis not present

## 2017-04-26 DIAGNOSIS — N390 Urinary tract infection, site not specified: Secondary | ICD-10-CM | POA: Diagnosis not present

## 2017-04-26 DIAGNOSIS — R509 Fever, unspecified: Secondary | ICD-10-CM | POA: Diagnosis not present

## 2017-04-26 DIAGNOSIS — K8020 Calculus of gallbladder without cholecystitis without obstruction: Secondary | ICD-10-CM | POA: Diagnosis not present

## 2017-05-14 DIAGNOSIS — R3121 Asymptomatic microscopic hematuria: Secondary | ICD-10-CM | POA: Diagnosis not present

## 2017-05-23 DIAGNOSIS — R8271 Bacteriuria: Secondary | ICD-10-CM | POA: Diagnosis not present

## 2017-05-23 DIAGNOSIS — C678 Malignant neoplasm of overlapping sites of bladder: Secondary | ICD-10-CM | POA: Diagnosis not present

## 2017-05-25 NOTE — Addendum Note (Signed)
Addendum  created 05/25/17 1219 by Lyndle Herrlich, MD   Sign clinical note

## 2017-05-25 NOTE — Anesthesia Postprocedure Evaluation (Signed)
Anesthesia Post Note  Patient: Alan Mendez  Procedure(s) Performed: Procedure(s) (LRB): CYSTOSCOPY BIOPSIES OF BLADDER TUMOR WITH FULGURATION (Left) CYSTOSCOPY WITH RETROGRADE PYELOGRAM (Left)     Anesthesia Post Evaluation  Last Vitals:  Vitals:   03/16/17 2000 03/17/17 0440  BP: 130/72 121/72  Pulse: 70 63  Resp: 16 16  Temp: 36.7 C 37.2 C    Last Pain:  Vitals:   03/19/17 1440  TempSrc:   PainSc: 0-No pain                 Kavaughn Faucett EDWARD

## 2017-05-30 DIAGNOSIS — Z5111 Encounter for antineoplastic chemotherapy: Secondary | ICD-10-CM | POA: Diagnosis not present

## 2017-05-30 DIAGNOSIS — C678 Malignant neoplasm of overlapping sites of bladder: Secondary | ICD-10-CM | POA: Diagnosis not present

## 2017-06-06 DIAGNOSIS — C678 Malignant neoplasm of overlapping sites of bladder: Secondary | ICD-10-CM | POA: Diagnosis not present

## 2017-06-06 DIAGNOSIS — Z5111 Encounter for antineoplastic chemotherapy: Secondary | ICD-10-CM | POA: Diagnosis not present

## 2017-06-13 DIAGNOSIS — Z5111 Encounter for antineoplastic chemotherapy: Secondary | ICD-10-CM | POA: Diagnosis not present

## 2017-06-13 DIAGNOSIS — C678 Malignant neoplasm of overlapping sites of bladder: Secondary | ICD-10-CM | POA: Diagnosis not present

## 2017-06-19 DIAGNOSIS — C44519 Basal cell carcinoma of skin of other part of trunk: Secondary | ICD-10-CM | POA: Diagnosis not present

## 2017-06-20 DIAGNOSIS — C678 Malignant neoplasm of overlapping sites of bladder: Secondary | ICD-10-CM | POA: Diagnosis not present

## 2017-06-20 DIAGNOSIS — Z5111 Encounter for antineoplastic chemotherapy: Secondary | ICD-10-CM | POA: Diagnosis not present

## 2017-06-27 DIAGNOSIS — C678 Malignant neoplasm of overlapping sites of bladder: Secondary | ICD-10-CM | POA: Diagnosis not present

## 2017-06-27 DIAGNOSIS — Z5111 Encounter for antineoplastic chemotherapy: Secondary | ICD-10-CM | POA: Diagnosis not present

## 2017-07-04 DIAGNOSIS — C678 Malignant neoplasm of overlapping sites of bladder: Secondary | ICD-10-CM | POA: Diagnosis not present

## 2017-07-04 DIAGNOSIS — Z5111 Encounter for antineoplastic chemotherapy: Secondary | ICD-10-CM | POA: Diagnosis not present

## 2017-07-04 DIAGNOSIS — R8271 Bacteriuria: Secondary | ICD-10-CM | POA: Diagnosis not present

## 2017-07-27 DIAGNOSIS — R300 Dysuria: Secondary | ICD-10-CM | POA: Diagnosis not present

## 2017-08-13 DIAGNOSIS — Z961 Presence of intraocular lens: Secondary | ICD-10-CM | POA: Diagnosis not present

## 2017-08-28 DIAGNOSIS — Z23 Encounter for immunization: Secondary | ICD-10-CM | POA: Diagnosis not present

## 2017-08-28 DIAGNOSIS — C678 Malignant neoplasm of overlapping sites of bladder: Secondary | ICD-10-CM | POA: Diagnosis not present

## 2017-08-28 DIAGNOSIS — N401 Enlarged prostate with lower urinary tract symptoms: Secondary | ICD-10-CM | POA: Diagnosis not present

## 2017-08-28 DIAGNOSIS — R3915 Urgency of urination: Secondary | ICD-10-CM | POA: Diagnosis not present

## 2017-09-03 DIAGNOSIS — M6281 Muscle weakness (generalized): Secondary | ICD-10-CM | POA: Diagnosis not present

## 2017-09-03 DIAGNOSIS — R3982 Chronic bladder pain: Secondary | ICD-10-CM | POA: Diagnosis not present

## 2017-09-03 DIAGNOSIS — M62838 Other muscle spasm: Secondary | ICD-10-CM | POA: Diagnosis not present

## 2017-09-03 DIAGNOSIS — R3914 Feeling of incomplete bladder emptying: Secondary | ICD-10-CM | POA: Diagnosis not present

## 2017-09-03 DIAGNOSIS — R278 Other lack of coordination: Secondary | ICD-10-CM | POA: Diagnosis not present

## 2017-09-03 DIAGNOSIS — R3915 Urgency of urination: Secondary | ICD-10-CM | POA: Diagnosis not present

## 2017-09-17 DIAGNOSIS — M6281 Muscle weakness (generalized): Secondary | ICD-10-CM | POA: Diagnosis not present

## 2017-09-17 DIAGNOSIS — R3914 Feeling of incomplete bladder emptying: Secondary | ICD-10-CM | POA: Diagnosis not present

## 2017-09-17 DIAGNOSIS — N401 Enlarged prostate with lower urinary tract symptoms: Secondary | ICD-10-CM | POA: Diagnosis not present

## 2017-09-17 DIAGNOSIS — M62838 Other muscle spasm: Secondary | ICD-10-CM | POA: Diagnosis not present

## 2017-09-17 DIAGNOSIS — R3982 Chronic bladder pain: Secondary | ICD-10-CM | POA: Diagnosis not present

## 2017-09-17 DIAGNOSIS — R3915 Urgency of urination: Secondary | ICD-10-CM | POA: Diagnosis not present

## 2017-09-25 DIAGNOSIS — N401 Enlarged prostate with lower urinary tract symptoms: Secondary | ICD-10-CM | POA: Diagnosis not present

## 2017-09-25 DIAGNOSIS — C678 Malignant neoplasm of overlapping sites of bladder: Secondary | ICD-10-CM | POA: Diagnosis not present

## 2017-09-25 DIAGNOSIS — N5201 Erectile dysfunction due to arterial insufficiency: Secondary | ICD-10-CM | POA: Diagnosis not present

## 2017-09-25 DIAGNOSIS — R3915 Urgency of urination: Secondary | ICD-10-CM | POA: Diagnosis not present

## 2017-09-28 ENCOUNTER — Other Ambulatory Visit: Payer: Self-pay | Admitting: Urology

## 2017-09-28 DIAGNOSIS — C679 Malignant neoplasm of bladder, unspecified: Secondary | ICD-10-CM | POA: Diagnosis not present

## 2017-09-28 DIAGNOSIS — C678 Malignant neoplasm of overlapping sites of bladder: Secondary | ICD-10-CM | POA: Diagnosis not present

## 2017-10-01 DIAGNOSIS — R3915 Urgency of urination: Secondary | ICD-10-CM | POA: Diagnosis not present

## 2017-10-01 DIAGNOSIS — R3982 Chronic bladder pain: Secondary | ICD-10-CM | POA: Diagnosis not present

## 2017-10-01 DIAGNOSIS — M62838 Other muscle spasm: Secondary | ICD-10-CM | POA: Diagnosis not present

## 2017-10-01 DIAGNOSIS — M6281 Muscle weakness (generalized): Secondary | ICD-10-CM | POA: Diagnosis not present

## 2017-10-01 DIAGNOSIS — R350 Frequency of micturition: Secondary | ICD-10-CM | POA: Diagnosis not present

## 2017-10-15 DIAGNOSIS — M6281 Muscle weakness (generalized): Secondary | ICD-10-CM | POA: Diagnosis not present

## 2017-10-15 DIAGNOSIS — M62838 Other muscle spasm: Secondary | ICD-10-CM | POA: Diagnosis not present

## 2017-10-15 DIAGNOSIS — R3982 Chronic bladder pain: Secondary | ICD-10-CM | POA: Diagnosis not present

## 2017-10-15 DIAGNOSIS — R3915 Urgency of urination: Secondary | ICD-10-CM | POA: Diagnosis not present

## 2017-10-24 DIAGNOSIS — Z5111 Encounter for antineoplastic chemotherapy: Secondary | ICD-10-CM | POA: Diagnosis not present

## 2017-10-24 DIAGNOSIS — C678 Malignant neoplasm of overlapping sites of bladder: Secondary | ICD-10-CM | POA: Diagnosis not present

## 2017-10-29 DIAGNOSIS — M62838 Other muscle spasm: Secondary | ICD-10-CM | POA: Diagnosis not present

## 2017-10-29 DIAGNOSIS — R3915 Urgency of urination: Secondary | ICD-10-CM | POA: Diagnosis not present

## 2017-10-29 DIAGNOSIS — R3982 Chronic bladder pain: Secondary | ICD-10-CM | POA: Diagnosis not present

## 2017-10-29 DIAGNOSIS — M6281 Muscle weakness (generalized): Secondary | ICD-10-CM | POA: Diagnosis not present

## 2017-10-29 DIAGNOSIS — R350 Frequency of micturition: Secondary | ICD-10-CM | POA: Diagnosis not present

## 2017-11-01 DIAGNOSIS — C678 Malignant neoplasm of overlapping sites of bladder: Secondary | ICD-10-CM | POA: Diagnosis not present

## 2017-11-01 DIAGNOSIS — R3121 Asymptomatic microscopic hematuria: Secondary | ICD-10-CM | POA: Diagnosis not present

## 2017-11-14 DIAGNOSIS — R310 Gross hematuria: Secondary | ICD-10-CM | POA: Diagnosis not present

## 2017-11-14 DIAGNOSIS — C678 Malignant neoplasm of overlapping sites of bladder: Secondary | ICD-10-CM | POA: Diagnosis not present

## 2017-11-14 DIAGNOSIS — Z5111 Encounter for antineoplastic chemotherapy: Secondary | ICD-10-CM | POA: Diagnosis not present

## 2017-11-19 DIAGNOSIS — C679 Malignant neoplasm of bladder, unspecified: Secondary | ICD-10-CM | POA: Diagnosis not present

## 2017-11-19 DIAGNOSIS — E782 Mixed hyperlipidemia: Secondary | ICD-10-CM | POA: Diagnosis not present

## 2017-11-19 DIAGNOSIS — Z86010 Personal history of colonic polyps: Secondary | ICD-10-CM | POA: Diagnosis not present

## 2017-11-21 DIAGNOSIS — R8271 Bacteriuria: Secondary | ICD-10-CM | POA: Diagnosis not present

## 2017-11-21 DIAGNOSIS — C678 Malignant neoplasm of overlapping sites of bladder: Secondary | ICD-10-CM | POA: Diagnosis not present

## 2017-11-21 DIAGNOSIS — Z5111 Encounter for antineoplastic chemotherapy: Secondary | ICD-10-CM | POA: Diagnosis not present

## 2017-11-27 DIAGNOSIS — M62838 Other muscle spasm: Secondary | ICD-10-CM | POA: Diagnosis not present

## 2017-11-27 DIAGNOSIS — R3915 Urgency of urination: Secondary | ICD-10-CM | POA: Diagnosis not present

## 2017-11-27 DIAGNOSIS — R3914 Feeling of incomplete bladder emptying: Secondary | ICD-10-CM | POA: Diagnosis not present

## 2017-11-27 DIAGNOSIS — M6281 Muscle weakness (generalized): Secondary | ICD-10-CM | POA: Diagnosis not present

## 2017-11-27 DIAGNOSIS — R3982 Chronic bladder pain: Secondary | ICD-10-CM | POA: Diagnosis not present

## 2017-11-28 DIAGNOSIS — Z5111 Encounter for antineoplastic chemotherapy: Secondary | ICD-10-CM | POA: Diagnosis not present

## 2017-11-28 DIAGNOSIS — C678 Malignant neoplasm of overlapping sites of bladder: Secondary | ICD-10-CM | POA: Diagnosis not present

## 2017-12-05 DIAGNOSIS — Z5111 Encounter for antineoplastic chemotherapy: Secondary | ICD-10-CM | POA: Diagnosis not present

## 2017-12-05 DIAGNOSIS — C678 Malignant neoplasm of overlapping sites of bladder: Secondary | ICD-10-CM | POA: Diagnosis not present

## 2017-12-12 DIAGNOSIS — C678 Malignant neoplasm of overlapping sites of bladder: Secondary | ICD-10-CM | POA: Diagnosis not present

## 2017-12-12 DIAGNOSIS — R3914 Feeling of incomplete bladder emptying: Secondary | ICD-10-CM | POA: Diagnosis not present

## 2017-12-12 DIAGNOSIS — Z5111 Encounter for antineoplastic chemotherapy: Secondary | ICD-10-CM | POA: Diagnosis not present

## 2017-12-19 DIAGNOSIS — C678 Malignant neoplasm of overlapping sites of bladder: Secondary | ICD-10-CM | POA: Diagnosis not present

## 2017-12-19 DIAGNOSIS — Z5111 Encounter for antineoplastic chemotherapy: Secondary | ICD-10-CM | POA: Diagnosis not present

## 2017-12-27 DIAGNOSIS — D1801 Hemangioma of skin and subcutaneous tissue: Secondary | ICD-10-CM | POA: Diagnosis not present

## 2017-12-27 DIAGNOSIS — L821 Other seborrheic keratosis: Secondary | ICD-10-CM | POA: Diagnosis not present

## 2017-12-27 DIAGNOSIS — C44612 Basal cell carcinoma of skin of right upper limb, including shoulder: Secondary | ICD-10-CM | POA: Diagnosis not present

## 2017-12-27 DIAGNOSIS — C44519 Basal cell carcinoma of skin of other part of trunk: Secondary | ICD-10-CM | POA: Diagnosis not present

## 2017-12-27 DIAGNOSIS — L814 Other melanin hyperpigmentation: Secondary | ICD-10-CM | POA: Diagnosis not present

## 2017-12-27 DIAGNOSIS — D2261 Melanocytic nevi of right upper limb, including shoulder: Secondary | ICD-10-CM | POA: Diagnosis not present

## 2017-12-27 DIAGNOSIS — D225 Melanocytic nevi of trunk: Secondary | ICD-10-CM | POA: Diagnosis not present

## 2017-12-27 DIAGNOSIS — L905 Scar conditions and fibrosis of skin: Secondary | ICD-10-CM | POA: Diagnosis not present

## 2017-12-27 DIAGNOSIS — L570 Actinic keratosis: Secondary | ICD-10-CM | POA: Diagnosis not present

## 2017-12-27 DIAGNOSIS — D2262 Melanocytic nevi of left upper limb, including shoulder: Secondary | ICD-10-CM | POA: Diagnosis not present

## 2017-12-27 DIAGNOSIS — D485 Neoplasm of uncertain behavior of skin: Secondary | ICD-10-CM | POA: Diagnosis not present

## 2017-12-31 DIAGNOSIS — M62838 Other muscle spasm: Secondary | ICD-10-CM | POA: Diagnosis not present

## 2017-12-31 DIAGNOSIS — M6281 Muscle weakness (generalized): Secondary | ICD-10-CM | POA: Diagnosis not present

## 2017-12-31 DIAGNOSIS — R3982 Chronic bladder pain: Secondary | ICD-10-CM | POA: Diagnosis not present

## 2017-12-31 DIAGNOSIS — R3915 Urgency of urination: Secondary | ICD-10-CM | POA: Diagnosis not present

## 2018-01-10 DIAGNOSIS — K5730 Diverticulosis of large intestine without perforation or abscess without bleeding: Secondary | ICD-10-CM | POA: Diagnosis not present

## 2018-01-10 DIAGNOSIS — Z86010 Personal history of colonic polyps: Secondary | ICD-10-CM | POA: Diagnosis not present

## 2018-01-14 DIAGNOSIS — C44519 Basal cell carcinoma of skin of other part of trunk: Secondary | ICD-10-CM | POA: Diagnosis not present

## 2018-01-14 DIAGNOSIS — Z85828 Personal history of other malignant neoplasm of skin: Secondary | ICD-10-CM | POA: Diagnosis not present

## 2018-01-14 DIAGNOSIS — C44612 Basal cell carcinoma of skin of right upper limb, including shoulder: Secondary | ICD-10-CM | POA: Diagnosis not present

## 2018-02-21 DIAGNOSIS — R972 Elevated prostate specific antigen [PSA]: Secondary | ICD-10-CM | POA: Diagnosis not present

## 2018-02-21 DIAGNOSIS — C678 Malignant neoplasm of overlapping sites of bladder: Secondary | ICD-10-CM | POA: Diagnosis not present

## 2018-02-21 DIAGNOSIS — C651 Malignant neoplasm of right renal pelvis: Secondary | ICD-10-CM | POA: Diagnosis not present

## 2018-02-25 DIAGNOSIS — C678 Malignant neoplasm of overlapping sites of bladder: Secondary | ICD-10-CM | POA: Diagnosis not present

## 2018-02-25 DIAGNOSIS — Z5111 Encounter for antineoplastic chemotherapy: Secondary | ICD-10-CM | POA: Diagnosis not present

## 2018-02-28 DIAGNOSIS — R82998 Other abnormal findings in urine: Secondary | ICD-10-CM | POA: Diagnosis not present

## 2018-02-28 DIAGNOSIS — Z125 Encounter for screening for malignant neoplasm of prostate: Secondary | ICD-10-CM | POA: Diagnosis not present

## 2018-02-28 DIAGNOSIS — E7800 Pure hypercholesterolemia, unspecified: Secondary | ICD-10-CM | POA: Diagnosis not present

## 2018-03-04 DIAGNOSIS — C678 Malignant neoplasm of overlapping sites of bladder: Secondary | ICD-10-CM | POA: Diagnosis not present

## 2018-03-04 DIAGNOSIS — Z5111 Encounter for antineoplastic chemotherapy: Secondary | ICD-10-CM | POA: Diagnosis not present

## 2018-03-08 DIAGNOSIS — Z1389 Encounter for screening for other disorder: Secondary | ICD-10-CM | POA: Diagnosis not present

## 2018-03-08 DIAGNOSIS — Z0000 Encounter for general adult medical examination without abnormal findings: Secondary | ICD-10-CM | POA: Diagnosis not present

## 2018-03-08 DIAGNOSIS — N411 Chronic prostatitis: Secondary | ICD-10-CM | POA: Diagnosis not present

## 2018-03-08 DIAGNOSIS — M419 Scoliosis, unspecified: Secondary | ICD-10-CM | POA: Diagnosis not present

## 2018-03-08 DIAGNOSIS — N401 Enlarged prostate with lower urinary tract symptoms: Secondary | ICD-10-CM | POA: Diagnosis not present

## 2018-03-08 DIAGNOSIS — C649 Malignant neoplasm of unspecified kidney, except renal pelvis: Secondary | ICD-10-CM | POA: Diagnosis not present

## 2018-03-08 DIAGNOSIS — Z6824 Body mass index (BMI) 24.0-24.9, adult: Secondary | ICD-10-CM | POA: Diagnosis not present

## 2018-03-08 DIAGNOSIS — R972 Elevated prostate specific antigen [PSA]: Secondary | ICD-10-CM | POA: Diagnosis not present

## 2018-03-08 DIAGNOSIS — M1712 Unilateral primary osteoarthritis, left knee: Secondary | ICD-10-CM | POA: Diagnosis not present

## 2018-03-08 DIAGNOSIS — E7800 Pure hypercholesterolemia, unspecified: Secondary | ICD-10-CM | POA: Diagnosis not present

## 2018-03-08 DIAGNOSIS — C679 Malignant neoplasm of bladder, unspecified: Secondary | ICD-10-CM | POA: Diagnosis not present

## 2018-03-08 DIAGNOSIS — M1711 Unilateral primary osteoarthritis, right knee: Secondary | ICD-10-CM | POA: Diagnosis not present

## 2018-03-11 DIAGNOSIS — C678 Malignant neoplasm of overlapping sites of bladder: Secondary | ICD-10-CM | POA: Diagnosis not present

## 2018-03-11 DIAGNOSIS — Z5111 Encounter for antineoplastic chemotherapy: Secondary | ICD-10-CM | POA: Diagnosis not present

## 2018-03-15 DIAGNOSIS — Z1212 Encounter for screening for malignant neoplasm of rectum: Secondary | ICD-10-CM | POA: Diagnosis not present

## 2018-07-02 DIAGNOSIS — R3915 Urgency of urination: Secondary | ICD-10-CM | POA: Diagnosis not present

## 2018-07-17 DIAGNOSIS — L821 Other seborrheic keratosis: Secondary | ICD-10-CM | POA: Diagnosis not present

## 2018-07-17 DIAGNOSIS — L570 Actinic keratosis: Secondary | ICD-10-CM | POA: Diagnosis not present

## 2018-07-17 DIAGNOSIS — D485 Neoplasm of uncertain behavior of skin: Secondary | ICD-10-CM | POA: Diagnosis not present

## 2018-07-17 DIAGNOSIS — D170 Benign lipomatous neoplasm of skin and subcutaneous tissue of head, face and neck: Secondary | ICD-10-CM | POA: Diagnosis not present

## 2018-07-17 DIAGNOSIS — Z85828 Personal history of other malignant neoplasm of skin: Secondary | ICD-10-CM | POA: Diagnosis not present

## 2018-07-30 DIAGNOSIS — D170 Benign lipomatous neoplasm of skin and subcutaneous tissue of head, face and neck: Secondary | ICD-10-CM | POA: Diagnosis not present

## 2018-07-30 DIAGNOSIS — Z85828 Personal history of other malignant neoplasm of skin: Secondary | ICD-10-CM | POA: Diagnosis not present

## 2018-08-15 DIAGNOSIS — Z961 Presence of intraocular lens: Secondary | ICD-10-CM | POA: Diagnosis not present

## 2018-08-17 DIAGNOSIS — Z23 Encounter for immunization: Secondary | ICD-10-CM | POA: Diagnosis not present

## 2018-08-19 DIAGNOSIS — C651 Malignant neoplasm of right renal pelvis: Secondary | ICD-10-CM | POA: Diagnosis not present

## 2018-08-19 DIAGNOSIS — R3914 Feeling of incomplete bladder emptying: Secondary | ICD-10-CM | POA: Diagnosis not present

## 2018-08-19 DIAGNOSIS — C678 Malignant neoplasm of overlapping sites of bladder: Secondary | ICD-10-CM | POA: Diagnosis not present

## 2018-09-04 DIAGNOSIS — C678 Malignant neoplasm of overlapping sites of bladder: Secondary | ICD-10-CM | POA: Diagnosis not present

## 2018-09-04 DIAGNOSIS — Z5111 Encounter for antineoplastic chemotherapy: Secondary | ICD-10-CM | POA: Diagnosis not present

## 2018-09-11 DIAGNOSIS — C678 Malignant neoplasm of overlapping sites of bladder: Secondary | ICD-10-CM | POA: Diagnosis not present

## 2018-09-11 DIAGNOSIS — Z5111 Encounter for antineoplastic chemotherapy: Secondary | ICD-10-CM | POA: Diagnosis not present

## 2018-09-18 DIAGNOSIS — C678 Malignant neoplasm of overlapping sites of bladder: Secondary | ICD-10-CM | POA: Diagnosis not present

## 2018-12-27 DIAGNOSIS — C44519 Basal cell carcinoma of skin of other part of trunk: Secondary | ICD-10-CM | POA: Diagnosis not present

## 2018-12-27 DIAGNOSIS — L814 Other melanin hyperpigmentation: Secondary | ICD-10-CM | POA: Diagnosis not present

## 2018-12-27 DIAGNOSIS — D2271 Melanocytic nevi of right lower limb, including hip: Secondary | ICD-10-CM | POA: Diagnosis not present

## 2018-12-27 DIAGNOSIS — L821 Other seborrheic keratosis: Secondary | ICD-10-CM | POA: Diagnosis not present

## 2018-12-27 DIAGNOSIS — D2261 Melanocytic nevi of right upper limb, including shoulder: Secondary | ICD-10-CM | POA: Diagnosis not present

## 2018-12-27 DIAGNOSIS — D1801 Hemangioma of skin and subcutaneous tissue: Secondary | ICD-10-CM | POA: Diagnosis not present

## 2018-12-27 DIAGNOSIS — Z85828 Personal history of other malignant neoplasm of skin: Secondary | ICD-10-CM | POA: Diagnosis not present

## 2018-12-27 DIAGNOSIS — D225 Melanocytic nevi of trunk: Secondary | ICD-10-CM | POA: Diagnosis not present

## 2019-01-24 ENCOUNTER — Emergency Department (HOSPITAL_COMMUNITY)
Admission: EM | Admit: 2019-01-24 | Discharge: 2019-01-24 | Disposition: A | Discharge status: Home / Self Care | Payer: Medicare Other | Attending: Emergency Medicine | Admitting: Emergency Medicine

## 2019-01-24 ENCOUNTER — Encounter (HOSPITAL_COMMUNITY): Payer: Self-pay

## 2019-01-24 ENCOUNTER — Other Ambulatory Visit: Payer: Self-pay

## 2019-01-24 DIAGNOSIS — Z79899 Other long term (current) drug therapy: Secondary | ICD-10-CM | POA: Diagnosis not present

## 2019-01-24 DIAGNOSIS — Z7982 Long term (current) use of aspirin: Secondary | ICD-10-CM | POA: Insufficient documentation

## 2019-01-24 DIAGNOSIS — Z85528 Personal history of other malignant neoplasm of kidney: Secondary | ICD-10-CM | POA: Insufficient documentation

## 2019-01-24 DIAGNOSIS — J069 Acute upper respiratory infection, unspecified: Secondary | ICD-10-CM | POA: Diagnosis not present

## 2019-01-24 DIAGNOSIS — R0989 Other specified symptoms and signs involving the circulatory and respiratory systems: Secondary | ICD-10-CM | POA: Diagnosis present

## 2019-01-24 NOTE — ED Triage Notes (Signed)
Patient states he was in [place] from 3/2-01/19/19. Patient states he had chest congestion while in [place], but no fever. No cough or fever today.

## 2019-01-28 NOTE — ED Provider Notes (Signed)
[facility] DEPT Provider Note   CSN: [id number] Arrival date & time: 01/24/19  1254    History   Chief Complaint Chief Complaint  Patient presents with  . out of country traveler    HPI Alan Mendez is a 78 y.o. male.     HPI 78 year old male presents to the emergency department with upper respiratory symptoms since recently returning from [place].  He has had no fever and over the past 24 to 48 hours he has had no upper respiratory symptoms.  He has been self isolating at home since returning on 01/19/2019.  He feels better he would like to stop self isolating at this time and is coming to the ER for recommendations.  No shortness of breath.  No fever.  Patient is active.  No other complaints    Past Medical History:  Diagnosis Date  . Acquired solitary kidney    left --  s/p  right nephroureterctomy 01/ 2018  . Anxiety   . Depression   . Elevated PSA   . History of adenomatous polyp of colon    tubular adenoma 2013  . History of kidney cancer urologist-  dr Louis Meckel   dx 11/ 2017--  11-15-2016  s/p  right nephoureterectomy (per path report-- low grade papillay urothelial carcinoma in situ involving renal pelvis, negative margins)  . History of kidney stones   . History of unilateral nephrectomy    01/ 2018  right nephroureterectomy for renal pelvis mass (carcinoma in situ)  . HLD (hyperlipidemia)   . Hyperplasia of prostate without lower urinary tract symptoms (LUTS)   . Nephrolithiasis    bilateral non-obstructive  . 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 Renal cyst, acquired, right   . Wears glasses     Patient Active Problem List   Diagnosis Date Noted  . Bladder cancer ([place]) 03/16/2017  . Urothelial carcinoma of kidney, right ([place]) 11/15/2016  . Elevated prostate specific antigen (PSA) 04/06/2015  . Nephrolithiasis 04/06/2015  . Urinary frequency 04/06/2015  . Benign prostatic hyperplasia with urinary obstruction  09/18/2013  . Idiopathic scoliosis 09/18/2013  . Diverticulosis 09/03/2012  . Renal cyst, acquired 03/28/2012    Past Surgical History:  Procedure Laterality Date  . APPENDECTOMY  child  . CATARACT EXTRACTION W/ INTRAOCULAR LENS  IMPLANT, BILATERAL  2017  . COLONOSCOPY  08/15/2012   Tubular Adenoma, No high grade dysplasia or malignacy.  . CYSTOSCOPY W/ RETROGRADES Right 09/25/2016   Procedure: CYSTOSCOPY, URETHRAL DILITATION  WITH RETROGRADE PYELOGRAM, BRUSH BIOPSIES OF RIGHT RENAL MASS;  Surgeon: Carolan Clines, MD;  Location: [facility];  Service: Urology;  Laterality: Right;  . CYSTOSCOPY W/ RETROGRADES Left 03/16/2017   Procedure: CYSTOSCOPY WITH RETROGRADE PYELOGRAM;  Surgeon: Ardis Hughs, MD;  Location: [facility];  Service: Urology;  Laterality: Left;  . CYSTOSCOPY W/ URETERAL STENT REMOVAL Right 09/25/2016   Procedure: CYSTOSCOPY WITH STENT REMOVAL;  Surgeon: Carolan Clines, MD;  Location: [facility];  Service: Urology;  Laterality: Right;  . CYSTOSCOPY WITH RETROGRADE PYELOGRAM, URETEROSCOPY AND STENT PLACEMENT Right 08/21/2016   Procedure: CYSTOSCOPY WITH RIGHT RETROGRADE PYELOGRAM, RIGHT FLEXIBLE AND RIGID URETEROSCOPY, INSERTION DOUBLE J STENT RIGHT;  Surgeon: Carolan Clines, MD;  Location: [facility];  Service: Urology;  Laterality: Right;  . EXTRACORPOREAL SHOCK WAVE LITHOTRIPSY  2009  . KNEE SURGERY Right 1980's  . ROBOT ASSITED LAPAROSCOPIC NEPHROURETERECTOMY Right 11/15/2016   Procedure: RIGHT XI ROBOT ASSITED LAPAROSCOPIC NEPHROURETERECTOMY;  Surgeon: Ardis Hughs, MD;  Location: [facility];  Service: Urology;  Laterality: Right;  . TRANSURETHRAL RESECTION OF BLADDER TUMOR WITH MITOMYCIN-C Left 03/16/2017   Procedure: CYSTOSCOPY BIOPSIES OF BLADDER TUMOR WITH FULGURATION;  Surgeon: Ardis Hughs, MD;  Location: [facility];  Service: Urology;  Laterality: Left;  .  URETEROSCOPY Right 09/25/2016   Procedure: RIGHT URETEROSCOPY;  Surgeon: Carolan Clines, MD;  Location: [facility];  Service: Urology;  Laterality: Right;        Home Medications    Prior to Admission medications   Medication Sig Start Date End Date Taking? Authorizing Provider  aspirin EC 81 MG tablet Take 81 mg by mouth daily.    [provider]  Multiple Vitamins-Minerals (MULTIVITAMIN WITH MINERALS) tablet Take 1 tablet by mouth daily.    [provider]  Naproxen Sodium (ALEVE) 220 MG CAPS Take 220-440 mg by mouth every 12 (twelve) hours as needed (pain).     [provider]  rosuvastatin (CRESTOR) 10 MG tablet TAKE 1 TABLET EACH DAY. Patient taking differently: Take 10 mg by mouth each morning 12/31/15   Jaynee Eagles, PA-C  tamsulosin (FLOMAX) 0.4 MG CAPS capsule Take 1 capsule (0.4 mg total) by mouth daily. Patient taking differently: Take 0.4 mg by mouth daily after breakfast.  09/25/16   Carolan Clines, MD  traMADol (ULTRAM) 50 MG tablet Take 1-2 tablets (50-100 mg total) by mouth every 6 (six) hours as needed for moderate pain. 03/16/17   Ardis Hughs, MD    Family History Family History  Problem Relation Age of Onset  . Heart disease Mother   . Cancer Father   . Cancer Paternal Grandmother   . Heart disease Paternal Grandfather     Social History Social History   Tobacco Use  . Smoking status: Former Smoker    Packs/day: 1.00    Years: 25.00    Pack years: 25.00    Types: Cigarettes    Last attempt to quit: 11/13/1985    Years since quitting: 33.2  . Smokeless tobacco: Never Used  Substance Use Topics  . Alcohol use: Yes    Alcohol/week: 2.0 standard drinks    Types: 2 Standard drinks or equivalent per week    Comment: occas  . Drug use: No     Allergies   Lamisil [terbinafine]   Review of Systems Review of Systems  All other systems reviewed and are negative.    Physical Exam Updated Vital  Signs BP 120/78   Pulse 76   Temp 98 F (36.7 C) (Oral)   Resp 19   Ht 5\' 5"  (1.651 m)   Wt 64.9 kg   SpO2 99%   BMI 23.80 kg/m   Physical Exam Vitals signs and nursing note reviewed.  Constitutional:      Appearance: He is well-developed.  HENT:     Head: Normocephalic and atraumatic.  Neck:     Musculoskeletal: Normal range of motion.  Cardiovascular:     Rate and Rhythm: Normal rate and regular rhythm.     Heart sounds: Normal heart sounds.  Pulmonary:     Effort: Pulmonary effort is normal. No respiratory distress.     Breath sounds: Normal breath sounds.  Abdominal:     General: There is no distension.     Palpations: Abdomen is soft.     Tenderness: There is no abdominal tenderness.  Musculoskeletal: Normal range of motion.  Skin:    General: Skin is  warm and dry.  Neurological:     Mental Status: He is alert and oriented to person, place, and time.  Psychiatric:        Judgment: Judgment normal.      ED Treatments / Results  Labs (all labs ordered are listed, but only abnormal results are displayed) Labs Reviewed - No data to display  EKG None  Radiology No results found.  Procedures Procedures (including critical care time)  Medications Ordered in ED Medications - No data to display   Initial Impression / Assessment and Plan / ED Course  I have reviewed the triage vital signs and the nursing notes.  Pertinent labs & imaging results that were available during my care of the patient were reviewed by me and considered in my medical decision making (see chart for details).        Asymptomatic here in the emergency department.  Feels fine.  No fever.  No longer with upper respiratory symptoms.  He has been fever free greater than 48 hours.  I do not think he needs to soft self quarantine at home.  I recommended ongoing standard precautions including handwashing and social distancing  Final Clinical Impressions(s) / ED Diagnoses   Final  diagnoses:  Upper respiratory tract infection, unspecified type    ED Discharge Orders    None       Jola Schmidt, MD 01/28/19 [phone]

## 2019-02-25 DIAGNOSIS — C651 Malignant neoplasm of right renal pelvis: Secondary | ICD-10-CM | POA: Diagnosis not present

## 2019-02-25 DIAGNOSIS — R310 Gross hematuria: Secondary | ICD-10-CM | POA: Diagnosis not present

## 2019-03-07 DIAGNOSIS — M549 Dorsalgia, unspecified: Secondary | ICD-10-CM | POA: Diagnosis not present

## 2019-03-07 DIAGNOSIS — Z79899 Other long term (current) drug therapy: Secondary | ICD-10-CM | POA: Diagnosis not present

## 2019-03-07 DIAGNOSIS — K8020 Calculus of gallbladder without cholecystitis without obstruction: Secondary | ICD-10-CM | POA: Diagnosis not present

## 2019-03-07 DIAGNOSIS — Z125 Encounter for screening for malignant neoplasm of prostate: Secondary | ICD-10-CM | POA: Diagnosis not present

## 2019-03-07 DIAGNOSIS — K5730 Diverticulosis of large intestine without perforation or abscess without bleeding: Secondary | ICD-10-CM | POA: Diagnosis not present

## 2019-03-07 DIAGNOSIS — R310 Gross hematuria: Secondary | ICD-10-CM | POA: Diagnosis not present

## 2019-03-07 DIAGNOSIS — E7800 Pure hypercholesterolemia, unspecified: Secondary | ICD-10-CM | POA: Diagnosis not present

## 2019-03-07 DIAGNOSIS — C678 Malignant neoplasm of overlapping sites of bladder: Secondary | ICD-10-CM | POA: Diagnosis not present

## 2019-03-10 DIAGNOSIS — C678 Malignant neoplasm of overlapping sites of bladder: Secondary | ICD-10-CM | POA: Diagnosis not present

## 2019-03-14 ENCOUNTER — Emergency Department (HOSPITAL_COMMUNITY)
Admission: EM | Admit: 2019-03-14 | Discharge: 2019-03-15 | Disposition: A | Discharge status: Home / Self Care | Payer: Medicare Other | Attending: Emergency Medicine | Admitting: Emergency Medicine

## 2019-03-14 ENCOUNTER — Emergency Department (HOSPITAL_COMMUNITY): Payer: Medicare Other

## 2019-03-14 ENCOUNTER — Encounter (HOSPITAL_COMMUNITY): Payer: Self-pay | Admitting: Emergency Medicine

## 2019-03-14 ENCOUNTER — Other Ambulatory Visit: Payer: Self-pay

## 2019-03-14 DIAGNOSIS — W19XXXA Unspecified fall, initial encounter: Secondary | ICD-10-CM

## 2019-03-14 DIAGNOSIS — Z87891 Personal history of nicotine dependence: Secondary | ICD-10-CM | POA: Diagnosis not present

## 2019-03-14 DIAGNOSIS — C679 Malignant neoplasm of bladder, unspecified: Secondary | ICD-10-CM | POA: Diagnosis not present

## 2019-03-14 DIAGNOSIS — Y929 Unspecified place or not applicable: Secondary | ICD-10-CM | POA: Diagnosis not present

## 2019-03-14 DIAGNOSIS — S79911A Unspecified injury of right hip, initial encounter: Secondary | ICD-10-CM | POA: Diagnosis not present

## 2019-03-14 DIAGNOSIS — S199XXA Unspecified injury of neck, initial encounter: Secondary | ICD-10-CM | POA: Diagnosis not present

## 2019-03-14 DIAGNOSIS — S0081XA Abrasion of other part of head, initial encounter: Secondary | ICD-10-CM | POA: Insufficient documentation

## 2019-03-14 DIAGNOSIS — Z7982 Long term (current) use of aspirin: Secondary | ICD-10-CM | POA: Diagnosis not present

## 2019-03-14 DIAGNOSIS — W109XXA Fall (on) (from) unspecified stairs and steps, initial encounter: Secondary | ICD-10-CM | POA: Insufficient documentation

## 2019-03-14 DIAGNOSIS — S3993XA Unspecified injury of pelvis, initial encounter: Secondary | ICD-10-CM | POA: Diagnosis not present

## 2019-03-14 DIAGNOSIS — Z1339 Encounter for screening examination for other mental health and behavioral disorders: Secondary | ICD-10-CM | POA: Diagnosis not present

## 2019-03-14 DIAGNOSIS — R404 Transient alteration of awareness: Secondary | ICD-10-CM | POA: Diagnosis not present

## 2019-03-14 DIAGNOSIS — M419 Scoliosis, unspecified: Secondary | ICD-10-CM | POA: Diagnosis not present

## 2019-03-14 DIAGNOSIS — E7800 Pure hypercholesterolemia, unspecified: Secondary | ICD-10-CM | POA: Diagnosis not present

## 2019-03-14 DIAGNOSIS — M179 Osteoarthritis of knee, unspecified: Secondary | ICD-10-CM | POA: Diagnosis not present

## 2019-03-14 DIAGNOSIS — Y999 Unspecified external cause status: Secondary | ICD-10-CM | POA: Insufficient documentation

## 2019-03-14 DIAGNOSIS — Z8551 Personal history of malignant neoplasm of bladder: Secondary | ICD-10-CM | POA: Insufficient documentation

## 2019-03-14 DIAGNOSIS — Z0000 Encounter for general adult medical examination without abnormal findings: Secondary | ICD-10-CM | POA: Diagnosis not present

## 2019-03-14 DIAGNOSIS — Z85528 Personal history of other malignant neoplasm of kidney: Secondary | ICD-10-CM | POA: Diagnosis not present

## 2019-03-14 DIAGNOSIS — Z1331 Encounter for screening for depression: Secondary | ICD-10-CM | POA: Diagnosis not present

## 2019-03-14 DIAGNOSIS — S80211A Abrasion, right knee, initial encounter: Secondary | ICD-10-CM | POA: Diagnosis not present

## 2019-03-14 DIAGNOSIS — Z23 Encounter for immunization: Secondary | ICD-10-CM | POA: Diagnosis not present

## 2019-03-14 DIAGNOSIS — N401 Enlarged prostate with lower urinary tract symptoms: Secondary | ICD-10-CM | POA: Diagnosis not present

## 2019-03-14 DIAGNOSIS — T07XXXA Unspecified multiple injuries, initial encounter: Secondary | ICD-10-CM | POA: Diagnosis present

## 2019-03-14 DIAGNOSIS — R05 Cough: Secondary | ICD-10-CM | POA: Diagnosis not present

## 2019-03-14 DIAGNOSIS — Z79899 Other long term (current) drug therapy: Secondary | ICD-10-CM | POA: Diagnosis not present

## 2019-03-14 DIAGNOSIS — S0990XA Unspecified injury of head, initial encounter: Secondary | ICD-10-CM | POA: Diagnosis not present

## 2019-03-14 DIAGNOSIS — C649 Malignant neoplasm of unspecified kidney, except renal pelvis: Secondary | ICD-10-CM | POA: Diagnosis not present

## 2019-03-14 DIAGNOSIS — Y939 Activity, unspecified: Secondary | ICD-10-CM | POA: Insufficient documentation

## 2019-03-14 DIAGNOSIS — S79912A Unspecified injury of left hip, initial encounter: Secondary | ICD-10-CM | POA: Diagnosis not present

## 2019-03-14 DIAGNOSIS — N411 Chronic prostatitis: Secondary | ICD-10-CM | POA: Diagnosis not present

## 2019-03-14 MED ORDER — TETANUS-DIPHTH-ACELL PERTUSSIS 5-2.5-18.5 LF-MCG/0.5 IM SUSP
0.5000 mL | Freq: Once | INTRAMUSCULAR | Status: AC
  Administered 2019-03-15: 0.5 mL via INTRAMUSCULAR
  Filled 2019-03-14: qty 0.5

## 2019-03-14 NOTE — ED Notes (Signed)
Shanon Brow (patient's son) called to say that he sent a picture of his father's ID and insurance card to his cell phone.

## 2019-03-14 NOTE — ED Notes (Signed)
Patient transported to X-ray 

## 2019-03-14 NOTE — ED Triage Notes (Signed)
Patient arrived with EMS from his friend's home missed his step and fell at stairs this evening with brief loss of consciousness , alert at oriented at arrival , pt. stated he drank [place] ( bourbon) this evening , presents with left facial skin abrasions and  right knee skin abrasions.  Denies pain /respirations unlabored .

## 2019-03-14 NOTE — ED Notes (Signed)
Patient's phone password  [phone]   Jen Eppinger (son) [phone]

## 2019-03-15 ENCOUNTER — Emergency Department (HOSPITAL_COMMUNITY): Payer: Medicare Other

## 2019-03-15 DIAGNOSIS — S0990XA Unspecified injury of head, initial encounter: Secondary | ICD-10-CM | POA: Diagnosis not present

## 2019-03-15 DIAGNOSIS — S79911A Unspecified injury of right hip, initial encounter: Secondary | ICD-10-CM | POA: Diagnosis not present

## 2019-03-15 DIAGNOSIS — S3993XA Unspecified injury of pelvis, initial encounter: Secondary | ICD-10-CM | POA: Diagnosis not present

## 2019-03-15 DIAGNOSIS — S199XXA Unspecified injury of neck, initial encounter: Secondary | ICD-10-CM | POA: Diagnosis not present

## 2019-03-15 DIAGNOSIS — S79912A Unspecified injury of left hip, initial encounter: Secondary | ICD-10-CM | POA: Diagnosis not present

## 2019-03-15 DIAGNOSIS — S0081XA Abrasion of other part of head, initial encounter: Secondary | ICD-10-CM | POA: Diagnosis not present

## 2019-03-15 LAB — BASIC METABOLIC PANEL
Anion gap: 11 (ref 5–15)
BUN: 29 mg/dL — ABNORMAL HIGH (ref 8–23)
CO2: 21 mmol/L — ABNORMAL LOW (ref 22–32)
Calcium: 9.2 mg/dL (ref 8.9–10.3)
Chloride: 108 mmol/L (ref 98–111)
Creatinine, Ser: 1.38 mg/dL — ABNORMAL HIGH (ref 0.61–1.24)
GFR calc Af Amer: 58 mL/min — ABNORMAL LOW (ref >60)
GFR calc non Af Amer: 50 mL/min — ABNORMAL LOW (ref >60)
Glucose, Bld: 106 mg/dL — ABNORMAL HIGH (ref 70–99)
Potassium: 3.9 mmol/L (ref 3.5–5.1)
Sodium: 140 mmol/L (ref 135–145)

## 2019-03-15 LAB — CBC WITH DIFFERENTIAL/PLATELET
Abs Immature Granulocytes: 0.04 10*3/uL (ref 0.00–0.07)
Basophils Absolute: 0 10*3/uL (ref 0.0–0.1)
Basophils Relative: 1 %
Eosinophils Absolute: 0.2 10*3/uL (ref 0.0–0.5)
Eosinophils Relative: 3 %
HCT: 45.8 % (ref 39.0–52.0)
Hemoglobin: 15 g/dL (ref 13.0–17.0)
Immature Granulocytes: 1 %
Lymphocytes Relative: 27 %
Lymphs Abs: 1.9 10*3/uL (ref 0.7–4.0)
MCH: 28.5 pg (ref 26.0–34.0)
MCHC: 32.8 g/dL (ref 30.0–36.0)
MCV: 86.9 fL (ref 80.0–100.0)
Monocytes Absolute: 0.6 10*3/uL (ref 0.1–1.0)
Monocytes Relative: 8 %
Neutro Abs: 4.2 10*3/uL (ref 1.7–7.7)
Neutrophils Relative %: 60 %
Platelets: 231 10*3/uL (ref 150–400)
RBC: 5.27 MIL/uL (ref 4.22–5.81)
RDW: 12.2 % (ref 11.5–15.5)
WBC: 6.9 10*3/uL (ref 4.0–10.5)
nRBC: 0 % (ref 0.0–0.2)

## 2019-03-15 NOTE — ED Provider Notes (Addendum)
[facility] EMERGENCY DEPARTMENT Provider Note   CSN: [id number] Arrival date & time: 03/14/19  2316    History   Chief Complaint Chief Complaint  Patient presents with   Fall    HPI Alan Mendez is a 78 y.o. male.     The history is provided by the patient.  Fall  This is a new problem. The current episode started less than 1 hour ago. The problem occurs constantly. The problem has been resolved. Pertinent negatives include no chest pain, no abdominal pain, no headaches and no shortness of breath. Nothing aggravates the symptoms. Nothing relieves the symptoms. He has tried nothing for the symptoms. The treatment provided no relief.  Went to a friend's house to drink alcohol and drank part of a bottle of bourbon and  and fell down 2 steps.  No LOC.  No CP no SOB no n/v/d.    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, negative margins)   History of kidney stones    History of unilateral nephrectomy    01/ 2018  right nephroureterectomy for renal pelvis mass (carcinoma in situ)   HLD (hyperlipidemia)    Hyperplasia of prostate without lower urinary tract symptoms (LUTS)    Nephrolithiasis    bilateral non-obstructive   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[place])    Renal cyst, acquired, right    Wears glasses     Patient Active Problem List   Diagnosis Date Noted   Bladder cancer ([place]) 03/16/2017   Urothelial carcinoma of kidney, right ([place]) 11/15/2016   Elevated prostate specific antigen (PSA) 04/06/2015   Nephrolithiasis 04/06/2015   Urinary frequency  04/06/2015   Benign prostatic hyperplasia with urinary obstruction 09/18/2013   Idiopathic scoliosis 09/18/2013   Diverticulosis 09/03/2012   Renal cyst, acquired 03/28/2012    Past Surgical History:  Procedure Laterality Date   APPENDECTOMY  child   CATARACT EXTRACTION W/ INTRAOCULAR LENS  IMPLANT, BILATERAL  2017   COLONOSCOPY  08/15/2012   Tubular Adenoma, No high grade dysplasia or malignacy.   CYSTOSCOPY W/ RETROGRADES Right 09/25/2016   Procedure: CYSTOSCOPY, URETHRAL DILITATION  WITH RETROGRADE PYELOGRAM, BRUSH BIOPSIES OF RIGHT RENAL MASS;  Surgeon: Carolan Clines, MD;  Location: [place];  Service: Urology;  Laterality: Right;   CYSTOSCOPY W/ RETROGRADES Left 03/16/2017   Procedure: CYSTOSCOPY WITH RETROGRADE PYELOGRAM;  Surgeon: Ardis Hughs, MD;  Location: [facility];  Service: Urology;  Laterality: Left;   CYSTOSCOPY W/ URETERAL STENT REMOVAL Right 09/25/2016   Procedure: CYSTOSCOPY WITH STENT REMOVAL;  Surgeon: Carolan Clines, MD;  Location: [place];  Service: Urology;  Laterality: Right;   CYSTOSCOPY WITH RETROGRADE PYELOGRAM, URETEROSCOPY AND STENT PLACEMENT Right 08/21/2016   Procedure: CYSTOSCOPY WITH RIGHT RETROGRADE PYELOGRAM, RIGHT FLEXIBLE AND RIGID URETEROSCOPY, INSERTION DOUBLE J STENT RIGHT;  Surgeon: Carolan Clines, MD;  Location: [place];  Service: Urology;  Laterality: Right;   EXTRACORPOREAL SHOCK WAVE LITHOTRIPSY  2009   KNEE SURGERY Right 1980's   ROBOT ASSITED LAPAROSCOPIC  NEPHROURETERECTOMY Right 11/15/2016   Procedure: RIGHT XI ROBOT ASSITED LAPAROSCOPIC NEPHROURETERECTOMY;  Surgeon: Ardis Hughs, MD;  Location: [facility];  Service: Urology;  Laterality: Right;   TRANSURETHRAL RESECTION OF BLADDER TUMOR WITH MITOMYCIN-C Left 03/16/2017   Procedure: CYSTOSCOPY BIOPSIES OF BLADDER TUMOR WITH FULGURATION;  Surgeon: Ardis Hughs, MD;  [facility];  Service: Urology;  Laterality: Left;   URETEROSCOPY Right 09/25/2016   Procedure: RIGHT URETEROSCOPY;  Surgeon: Carolan Clines, MD;  Location: [facility];  Service: Urology;  Laterality: Right;        Home Medications    Prior to Admission medications   Medication Sig Start Date End Date Taking? Authorizing Provider  aspirin EC 81 MG tablet Take 81 mg by mouth daily.    [provider]  Multiple Vitamins-Minerals (MULTIVITAMIN WITH MINERALS) tablet Take 1 tablet by mouth daily.    [provider]  Naproxen Sodium (ALEVE) 220 MG CAPS Take 220-440 mg by mouth every 12 (twelve) hours as needed (pain).     [provider]  rosuvastatin (CRESTOR) 10 MG tablet TAKE 1 TABLET EACH DAY. Patient taking differently: Take 10 mg by mouth each morning 12/31/15   Jaynee Eagles, PA-C  tamsulosin (FLOMAX) 0.4 MG CAPS capsule Take 1 capsule (0.4 mg total) by mouth daily. Patient taking differently: Take 0.4 mg by mouth daily after breakfast.  09/25/16   Carolan Clines, MD  traMADol (ULTRAM) 50 MG tablet Take 1-2 tablets (50-100 mg total) by mouth every 6 (six) hours as needed for moderate pain. 03/16/17   Ardis Hughs, MD    Family History Family History  Problem Relation Age of Onset   Heart disease Mother    Cancer Father    Cancer Paternal Grandmother    Heart disease Paternal Grandfather     Social History Social History   Tobacco Use   Smoking status: Former Smoker    Packs/day: 1.00    Years: 25.00    Pack years: 25.00    Types: Cigarettes    Last attempt to quit: 11/13/1985    Years since quitting: 33.3   Smokeless tobacco: Never Used  Substance Use Topics   Alcohol use: Yes    Alcohol/week: 2.0 standard drinks    Types: 2 Standard drinks or equivalent per week    Comment: occas   Drug use: No     Allergies   Lamisil [terbinafine]   Review of Systems Review of Systems  Constitutional:  Negative for fever.  HENT: Negative for sore throat.   Respiratory: Negative for shortness of breath.   Cardiovascular: Negative for chest pain.  Gastrointestinal: Negative for abdominal pain.  Genitourinary: Negative for flank pain.  Neurological: Negative for headaches.  All other systems reviewed and are negative.    Physical Exam Updated Vital Signs BP 98/68    Pulse (!) 55    Temp (!) 96.9 F (36.1 C) (Temporal)    Resp 14    SpO2 95%   Physical Exam Vitals signs and nursing note reviewed.  Constitutional:      General: He is not in acute distress.    Appearance: He is normal weight.  HENT:     Head: Normocephalic and atraumatic.     Nose: Nose normal.     Mouth/Throat:     Mouth: Mucous membranes are moist.     Pharynx: Oropharynx is clear.  Eyes:     Conjunctiva/sclera: Conjunctivae normal.     Pupils: Pupils  are equal, round, and reactive to light.  Neck:     Musculoskeletal: Normal range of motion and neck supple.  Cardiovascular:     Rate and Rhythm: Normal rate and regular rhythm.     Pulses: Normal pulses.     Heart sounds: Normal heart sounds.  Pulmonary:     Effort: Pulmonary effort is normal. No respiratory distress.     Breath sounds: Normal breath sounds. No wheezing.  Abdominal:     General: Abdomen is flat. Bowel sounds are normal.     Tenderness: There is no abdominal tenderness. There is no guarding.  Musculoskeletal: Normal range of motion.        General: No tenderness, deformity or signs of injury.     Right wrist: Normal.     Left wrist: Normal.     Right knee: Normal.     Left knee: Normal.     Right ankle: Normal. Achilles tendon normal.     Left ankle: Normal. Achilles tendon normal.     Cervical back: Normal.     Thoracic back: Normal.     Lumbar back: Normal.     Right hand: Normal. He exhibits normal capillary refill. Normal sensation noted. Normal strength noted.     Left hand: Normal. He exhibits normal capillary refill. Normal  sensation noted. Normal strength noted.  Skin:    General: Skin is warm and dry.     Capillary Refill: Capillary refill takes less than 2 seconds.  Neurological:     Mental Status: He is alert.      ED Treatments / Results  Labs (all labs ordered are listed, but only abnormal results are displayed) Results for orders placed or performed during the hospital encounter of 03/14/19  CBC with Differential/Platelet  Result Value Ref Range   WBC 6.9 4.0 - 10.5 K/uL   RBC 5.27 4.22 - 5.81 MIL/uL   Hemoglobin 15.0 13.0 - 17.0 g/dL   HCT 45.8 39.0 - 52.0 %   MCV 86.9 80.0 - 100.0 fL   MCH 28.5 26.0 - 34.0 pg   MCHC 32.8 30.0 - 36.0 g/dL   RDW 12.2 11.5 - 15.5 %   Platelets 231 150 - 400 K/uL   nRBC 0.0 0.0 - 0.2 %   Neutrophils Relative % 60 %   Neutro Abs 4.2 1.7 - 7.7 K/uL   Lymphocytes Relative 27 %   Lymphs Abs 1.9 0.7 - 4.0 K/uL   Monocytes Relative 8 %   Monocytes Absolute 0.6 0.1 - 1.0 K/uL   Eosinophils Relative 3 %   Eosinophils Absolute 0.2 0.0 - 0.5 K/uL   Basophils Relative 1 %   Basophils Absolute 0.0 0.0 - 0.1 K/uL   Immature Granulocytes 1 %   Abs Immature Granulocytes 0.04 0.00 - 0.07 K/uL  Basic metabolic panel  Result Value Ref Range   Sodium 140 135 - 145 mmol/L   Potassium 3.9 3.5 - 5.1 mmol/L   Chloride 108 98 - 111 mmol/L   CO2 21 (L) 22 - 32 mmol/L   Glucose, Bld 106 (H) 70 - 99 mg/dL   BUN 29 (H) 8 - 23 mg/dL   Creatinine, Ser 1.38 (H) 0.61 - 1.24 mg/dL   Calcium 9.2 8.9 - 10.3 mg/dL   GFR calc non Af Amer 50 (L) >60 mL/min   GFR calc Af Amer 58 (L) >60 mL/min   Anion gap 11 5 - 15   Ct Head Wo Contrast  Result Date: 03/15/2019 CLINICAL  DATA:  Fall EXAM: CT HEAD WITHOUT CONTRAST CT CERVICAL SPINE WITHOUT CONTRAST TECHNIQUE: Multidetector CT imaging of the head and cervical spine was performed following the standard protocol without intravenous contrast. Multiplanar CT image reconstructions of the cervical spine were also generated. COMPARISON:  None.  FINDINGS: CT HEAD FINDINGS Brain: Mild age related volume loss. No acute intracranial abnormality. Specifically, no hemorrhage, hydrocephalus, mass lesion, acute infarction, or significant intracranial injury. Vascular: No hyperdense vessel or unexpected calcification. Skull: No acute calvarial abnormality. Sinuses/Orbits: Visualized paranasal sinuses and mastoids clear. Orbital soft tissues unremarkable. Other: None CT CERVICAL SPINE FINDINGS Alignment: Normal Skull base and vertebrae: No acute fracture. No primary bone lesion or focal pathologic process. Soft tissues and spinal canal: No prevertebral fluid or swelling. No visible canal hematoma. Disc levels:  Diffuse degenerative disc and facet disease. Upper chest: No acute findings Other: None IMPRESSION: No acute intracranial abnormality. Diffuse degenerative disc and facet disease. No acute bony abnormality. Electronically Signed   By: Rolm Baptise M.D.   On: 03/15/2019 00:28   Ct Cervical Spine Wo Contrast  Result Date: 03/15/2019 CLINICAL DATA:  Fall EXAM: CT HEAD WITHOUT CONTRAST CT CERVICAL SPINE WITHOUT CONTRAST TECHNIQUE: Multidetector CT imaging of the head and cervical spine was performed following the standard protocol without intravenous contrast. Multiplanar CT image reconstructions of the cervical spine were also generated. COMPARISON:  None. FINDINGS: CT HEAD FINDINGS Brain: Mild age related volume loss. No acute intracranial abnormality. Specifically, no hemorrhage, hydrocephalus, mass lesion, acute infarction, or significant intracranial injury. Vascular: No hyperdense vessel or unexpected calcification. Skull: No acute calvarial abnormality. Sinuses/Orbits: Visualized paranasal sinuses and mastoids clear. Orbital soft tissues unremarkable. Other: None CT CERVICAL SPINE FINDINGS Alignment: Normal Skull base and vertebrae: No acute fracture. No primary bone lesion or focal pathologic process. Soft tissues and spinal canal: No prevertebral  fluid or swelling. No visible canal hematoma. Disc levels:  Diffuse degenerative disc and facet disease. Upper chest: No acute findings Other: None IMPRESSION: No acute intracranial abnormality. Diffuse degenerative disc and facet disease. No acute bony abnormality. Electronically Signed   By: Rolm Baptise M.D.   On: 03/15/2019 00:28   Dg Hips Bilat W Or Wo Pelvis 5 Views  Result Date: 03/15/2019 CLINICAL DATA:  Fall EXAM: DG HIP (WITH OR WITHOUT PELVIS) 5+V BILAT COMPARISON:  None. FINDINGS: There is no evidence of hip fracture or dislocation. There is no evidence of arthropathy or other focal bone abnormality. Hip joints and SI joints are symmetric and unremarkable. IMPRESSION: Negative. Electronically Signed   By: Rolm Baptise M.D.   On: 03/15/2019 00:18    Radiology Ct Head Wo Contrast  Result Date: 03/15/2019 CLINICAL DATA:  Fall EXAM: CT HEAD WITHOUT CONTRAST CT CERVICAL SPINE WITHOUT CONTRAST TECHNIQUE: Multidetector CT imaging of the head and cervical spine was performed following the standard protocol without intravenous contrast. Multiplanar CT image reconstructions of the cervical spine were also generated. COMPARISON:  None. FINDINGS: CT HEAD FINDINGS Brain: Mild age related volume loss. No acute intracranial abnormality. Specifically, no hemorrhage, hydrocephalus, mass lesion, acute infarction, or significant intracranial injury. Vascular: No hyperdense vessel or unexpected calcification. Skull: No acute calvarial abnormality. Sinuses/Orbits: Visualized paranasal sinuses and mastoids clear. Orbital soft tissues unremarkable. Other: None CT CERVICAL SPINE FINDINGS Alignment: Normal Skull base and vertebrae: No acute fracture. No primary bone lesion or focal pathologic process. Soft tissues and spinal canal: No prevertebral fluid or swelling. No visible canal hematoma. Disc levels:  Diffuse degenerative disc and facet disease. Upper  chest: No acute findings Other: None IMPRESSION: No acute  intracranial abnormality. Diffuse degenerative disc and facet disease. No acute bony abnormality. Electronically Signed   By: Rolm Baptise M.D.   On: 03/15/2019 00:28   Ct Cervical Spine Wo Contrast  Result Date: 03/15/2019 CLINICAL DATA:  Fall EXAM: CT HEAD WITHOUT CONTRAST CT CERVICAL SPINE WITHOUT CONTRAST TECHNIQUE: Multidetector CT imaging of the head and cervical spine was performed following the standard protocol without intravenous contrast. Multiplanar CT image reconstructions of the cervical spine were also generated. COMPARISON:  None. FINDINGS: CT HEAD FINDINGS Brain: Mild age related volume loss. No acute intracranial abnormality. Specifically, no hemorrhage, hydrocephalus, mass lesion, acute infarction, or significant intracranial injury. Vascular: No hyperdense vessel or unexpected calcification. Skull: No acute calvarial abnormality. Sinuses/Orbits: Visualized paranasal sinuses and mastoids clear. Orbital soft tissues unremarkable. Other: None CT CERVICAL SPINE FINDINGS Alignment: Normal Skull base and vertebrae: No acute fracture. No primary bone lesion or focal pathologic process. Soft tissues and spinal canal: No prevertebral fluid or swelling. No visible canal hematoma. Disc levels:  Diffuse degenerative disc and facet disease. Upper chest: No acute findings Other: None IMPRESSION: No acute intracranial abnormality. Diffuse degenerative disc and facet disease. No acute bony abnormality. Electronically Signed   By: Rolm Baptise M.D.   On: 03/15/2019 00:28   Dg Hips Bilat W Or Wo Pelvis 5 Views  Result Date: 03/15/2019 CLINICAL DATA:  Fall EXAM: DG HIP (WITH OR WITHOUT PELVIS) 5+V BILAT COMPARISON:  None. FINDINGS: There is no evidence of hip fracture or dislocation. There is no evidence of arthropathy or other focal bone abnormality. Hip joints and SI joints are symmetric and unremarkable. IMPRESSION: Negative. Electronically Signed   By: Rolm Baptise M.D.   On: 03/15/2019 00:18     Procedures Procedures (including critical care time)  Medications Ordered in ED Medications  Tdap (BOOSTRIX) injection 0.5 mL (0.5 mLs Intramuscular Given 03/15/19 0032)       Final Clinical Impressions(s) / ED Diagnoses   Return for intractable cough, coughing up blood,fevers >100.4 unrelieved by medication, shortness of breath, intractable vomiting, chest pain, shortness of breath, weakness,numbness, changes in speech, facial asymmetry,abdominal pain, passing out,Inability to tolerate liquids or food, cough, altered mental status or any concerns. No signs of systemic illness or infection. The patient is nontoxic-appearing on exam and vital signs are within normal limits.   I have reviewed the triage vital signs and the nursing notes. Pertinent labs &imaging results that were available during my care of the patient were reviewed by me and considered in my medical decision making (see chart for details).  After history, exam, and medical workup I feel the patient has been appropriately medically screened and is safe for discharge home. Pertinent diagnoses were discussed with the patient. Patient was given return precautions.      Tisheena Maguire, MD 03/15/19 0145    Veatrice Kells, MD 03/15/19 2229

## 2019-04-03 DIAGNOSIS — C678 Malignant neoplasm of overlapping sites of bladder: Secondary | ICD-10-CM | POA: Diagnosis not present

## 2019-04-03 DIAGNOSIS — Z5111 Encounter for antineoplastic chemotherapy: Secondary | ICD-10-CM | POA: Diagnosis not present

## 2019-04-10 DIAGNOSIS — Z5111 Encounter for antineoplastic chemotherapy: Secondary | ICD-10-CM | POA: Diagnosis not present

## 2019-04-10 DIAGNOSIS — C678 Malignant neoplasm of overlapping sites of bladder: Secondary | ICD-10-CM | POA: Diagnosis not present

## 2019-04-17 DIAGNOSIS — C678 Malignant neoplasm of overlapping sites of bladder: Secondary | ICD-10-CM | POA: Diagnosis not present

## 2019-04-17 DIAGNOSIS — Z5111 Encounter for antineoplastic chemotherapy: Secondary | ICD-10-CM | POA: Diagnosis not present

## 2019-04-23 DIAGNOSIS — Z20828 Contact with and (suspected) exposure to other viral communicable diseases: Secondary | ICD-10-CM | POA: Diagnosis not present

## 2019-06-27 DIAGNOSIS — L821 Other seborrheic keratosis: Secondary | ICD-10-CM | POA: Diagnosis not present

## 2019-06-27 DIAGNOSIS — D2262 Melanocytic nevi of left upper limb, including shoulder: Secondary | ICD-10-CM | POA: Diagnosis not present

## 2019-06-27 DIAGNOSIS — L814 Other melanin hyperpigmentation: Secondary | ICD-10-CM | POA: Diagnosis not present

## 2019-06-27 DIAGNOSIS — Z85828 Personal history of other malignant neoplasm of skin: Secondary | ICD-10-CM | POA: Diagnosis not present

## 2019-06-27 DIAGNOSIS — D2261 Melanocytic nevi of right upper limb, including shoulder: Secondary | ICD-10-CM | POA: Diagnosis not present

## 2019-06-27 DIAGNOSIS — D225 Melanocytic nevi of trunk: Secondary | ICD-10-CM | POA: Diagnosis not present

## 2019-06-27 DIAGNOSIS — D1801 Hemangioma of skin and subcutaneous tissue: Secondary | ICD-10-CM | POA: Diagnosis not present

## 2019-06-27 DIAGNOSIS — D2272 Melanocytic nevi of left lower limb, including hip: Secondary | ICD-10-CM | POA: Diagnosis not present

## 2019-06-27 DIAGNOSIS — D2271 Melanocytic nevi of right lower limb, including hip: Secondary | ICD-10-CM | POA: Diagnosis not present

## 2019-07-04 DIAGNOSIS — R3982 Chronic bladder pain: Secondary | ICD-10-CM | POA: Diagnosis not present

## 2019-07-14 DIAGNOSIS — Z23 Encounter for immunization: Secondary | ICD-10-CM | POA: Diagnosis not present

## 2019-08-18 DIAGNOSIS — Z961 Presence of intraocular lens: Secondary | ICD-10-CM | POA: Diagnosis not present

## 2019-10-02 ENCOUNTER — Other Ambulatory Visit: Payer: Self-pay | Admitting: Cardiology

## 2019-10-02 DIAGNOSIS — Z20822 Contact with and (suspected) exposure to covid-19: Secondary | ICD-10-CM

## 2019-10-06 LAB — NOVEL CORONAVIRUS, NAA: SARS-CoV-2, NAA: NOT DETECTED

## 2019-10-08 IMAGING — CT CT CERVICAL SPINE WITHOUT CONTRAST
5 of 8 series · 12 of 33 positions shown, 13 images · non-contrast
Comparison: None.

CLINICAL DATA: Fall

EXAM:
CT HEAD WITHOUT CONTRAST
CT CERVICAL SPINE WITHOUT CONTRAST
TECHNIQUE: Multidetector CT imaging of the head and cervical spine was
performed following the standard protocol without intravenous
contrast. Multiplanar CT image reconstructions of the cervical spine
were also generated.

[Series 7: head bone · axial · 0.45mm/px · z∈[-129,-73]mm · 2 of 84 slices shown]
[im 28/84  bone]
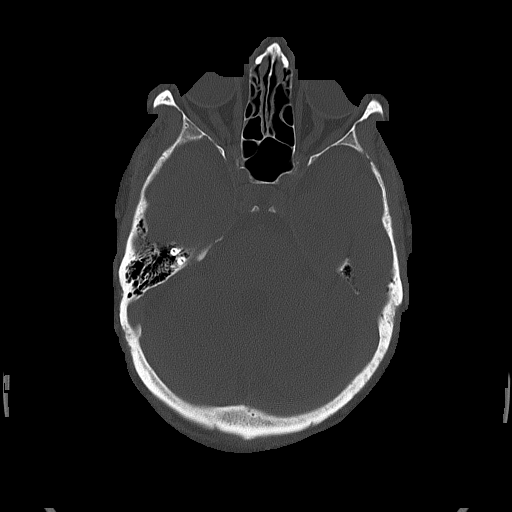
[im 56/84  bone]
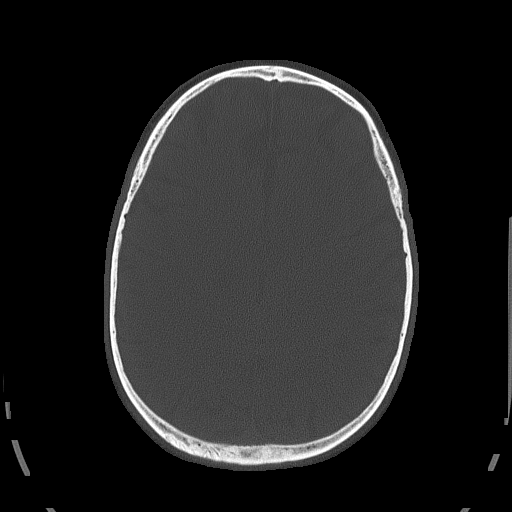

[Series 10: c_spine 2.0 st · axial · 0.29mm/px · z∈[-257,-203]mm · 2 of 81 slices shown, 3 images]
[im 27/81  soft-tissue]
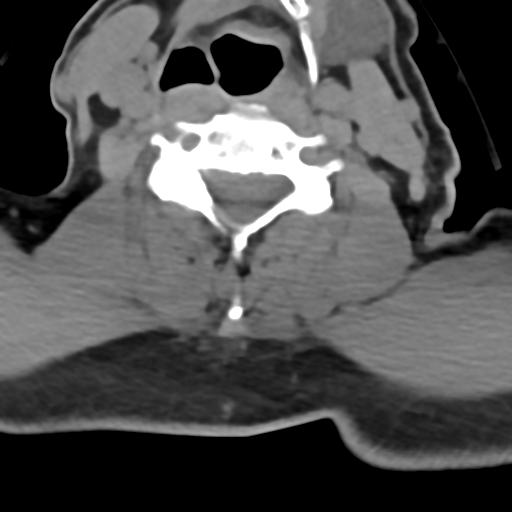
[im 27/81  bone]
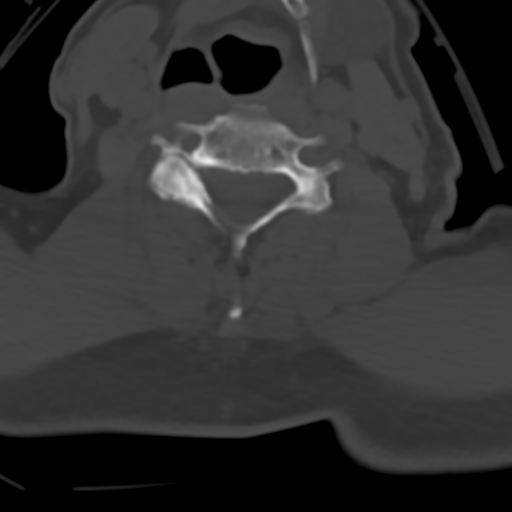
[im 54/81  bone]
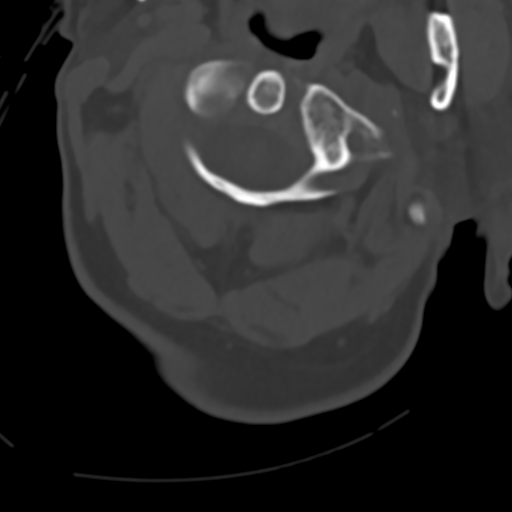

[Series 12: c_spine 2.0 sag bone · sagittal · 0.25mm/px · 5 of 61 slices shown]
[im 11/61  bone]
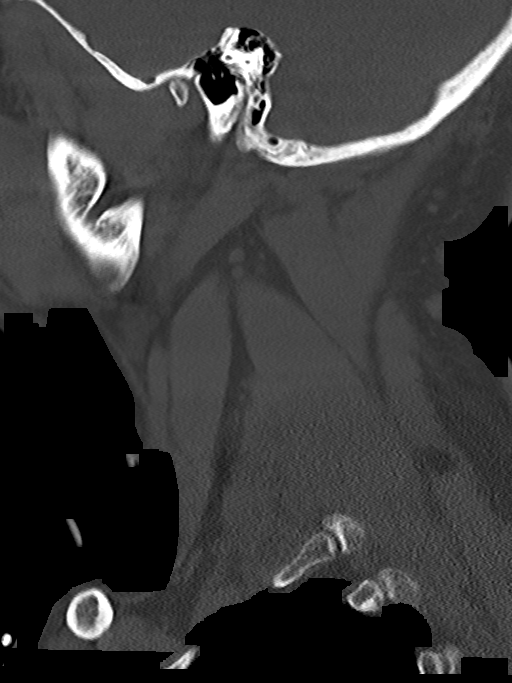
[im 21/61  bone]
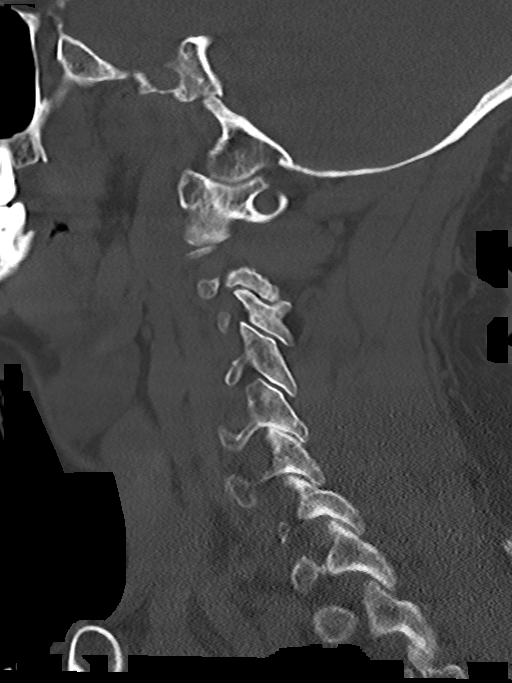
[im 31/61  bone]
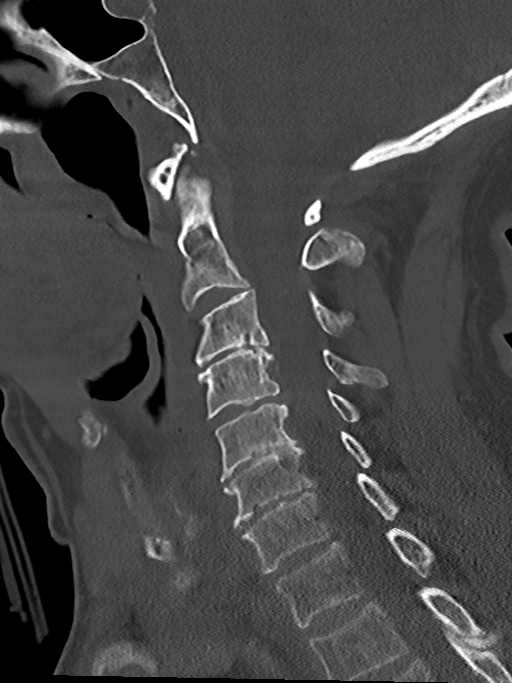
[im 41/61  bone]
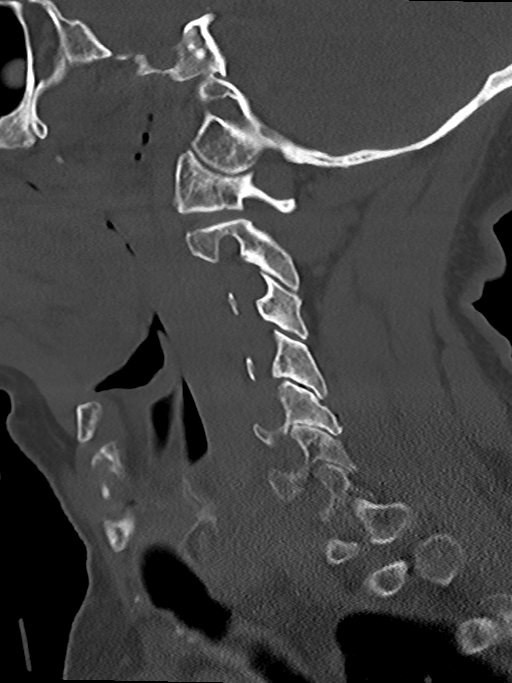
[im 51/61  bone]
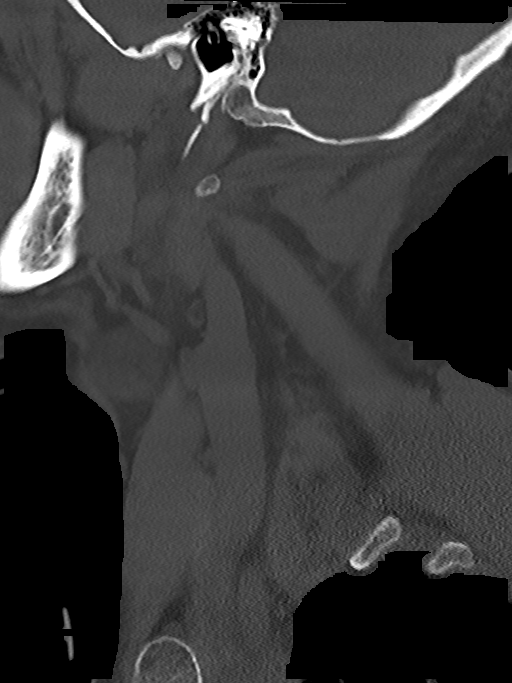

[Series 13: c_spine 2.0 cor bone · coronal · 0.25mm/px · 1 of 61 slices shown]
[im 31/61  bone]
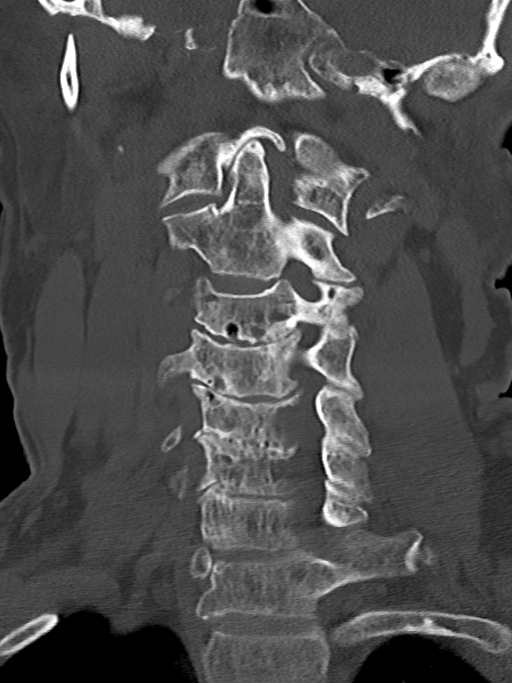

[Series 14: c_spine 2.0 orthogonals · axial · 0.21mm/px · z∈[-272,-221]mm · 2 of 71 slices shown]
[im 24/71  bone]
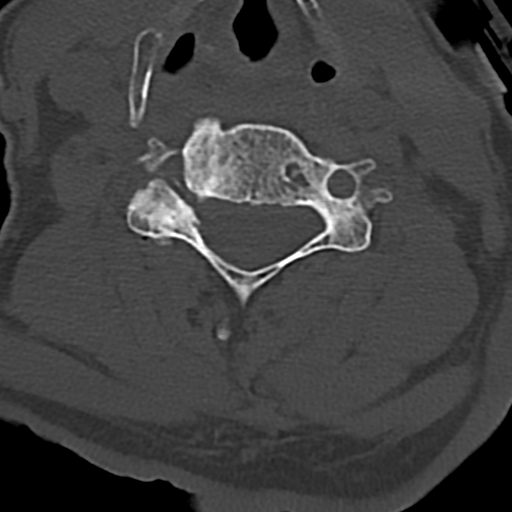
[im 47/71  bone]
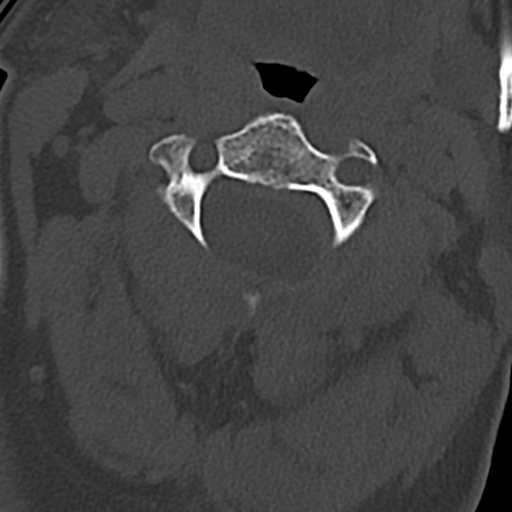

[12 of 33 positions shown; findings below may reference images not displayed]

FINDINGS: CT HEAD FINDINGS

Brain: Mild age related volume loss. No acute intracranial
abnormality. Specifically, no hemorrhage, hydrocephalus, mass
lesion, acute infarction, or significant intracranial injury.

Vascular: No hyperdense vessel or unexpected calcification.

Skull: No acute calvarial abnormality.

Sinuses/Orbits: Visualized paranasal sinuses and mastoids clear.
Orbital soft tissues unremarkable.

Other: None

CT CERVICAL SPINE FINDINGS

Alignment: Normal

Skull base and vertebrae: No acute fracture. No primary bone lesion
or focal pathologic process.

Soft tissues and spinal canal: No prevertebral fluid or swelling. No
visible canal hematoma.

Disc levels:  Diffuse degenerative disc and facet disease.

Upper chest: No acute findings

Other: None
IMPRESSION: No acute intracranial abnormality.

Diffuse degenerative disc and facet disease. No acute bony
abnormality.

## 2019-10-13 DIAGNOSIS — C678 Malignant neoplasm of overlapping sites of bladder: Secondary | ICD-10-CM | POA: Diagnosis not present

## 2019-11-04 DIAGNOSIS — R8271 Bacteriuria: Secondary | ICD-10-CM | POA: Diagnosis not present

## 2019-11-25 ENCOUNTER — Ambulatory Visit: Payer: Medicare Other | Attending: Internal Medicine

## 2019-11-25 DIAGNOSIS — Z23 Encounter for immunization: Secondary | ICD-10-CM | POA: Diagnosis not present

## 2019-11-25 NOTE — Progress Notes (Signed)
   Covid-19 Vaccination Clinic  Name:  Alan Mendez    MRN: [id number] DOB: 1941-02-16  11/25/2019  Mr. Alan Mendez was observed post Covid-19 immunization for 15 minutes without incidence. He was provided with Vaccine Information Sheet and instruction to access the V-Safe system.   Mr. Alan Mendez was instructed to call 911 with any severe reactions post vaccine: Marland Kitchen Difficulty breathing  . Swelling of your face and throat  . A fast heartbeat  . A bad rash all over your body  . Dizziness and weakness    Immunizations Administered    Name Date Dose VIS Date Route   Pfizer COVID-19 Vaccine 11/25/2019  8:38 AM 0.3 mL 10/24/2019 Intramuscular   Manufacturer: [place]   Lot: [id number]   [place]: [id number]

## 2019-12-01 DIAGNOSIS — R350 Frequency of micturition: Secondary | ICD-10-CM | POA: Diagnosis not present

## 2019-12-13 ENCOUNTER — Ambulatory Visit: Payer: Medicare Other | Attending: Internal Medicine

## 2019-12-13 ENCOUNTER — Ambulatory Visit: Payer: Medicare Other

## 2019-12-13 DIAGNOSIS — Z23 Encounter for immunization: Secondary | ICD-10-CM | POA: Insufficient documentation

## 2019-12-13 NOTE — Progress Notes (Signed)
   Covid-19 Vaccination Clinic  Name:  ZYON ROSSER    MRN: [id number] DOB: 02/19/1941  12/13/2019  Mr. Corey was observed post Covid-19 immunization for 15 minutes without incidence. He was provided with Vaccine Information Sheet and instruction to access the V-Safe system.   Mr. Stefan was instructed to call 911 with any severe reactions post vaccine: Marland Kitchen Difficulty breathing  . Swelling of your face and throat  . A fast heartbeat  . A bad rash all over your body  . Dizziness and weakness    Immunizations Administered    Name Date Dose VIS Date Route   Pfizer COVID-19 Vaccine 12/13/2019  1:10 PM 0.3 mL 10/24/2019 Intramuscular   Manufacturer: [place]   Lot: [id number]   [place]: [id number]

## 2019-12-18 DIAGNOSIS — R350 Frequency of micturition: Secondary | ICD-10-CM | POA: Diagnosis not present

## 2019-12-29 DIAGNOSIS — D2271 Melanocytic nevi of right lower limb, including hip: Secondary | ICD-10-CM | POA: Diagnosis not present

## 2019-12-29 DIAGNOSIS — D225 Melanocytic nevi of trunk: Secondary | ICD-10-CM | POA: Diagnosis not present

## 2019-12-29 DIAGNOSIS — D1801 Hemangioma of skin and subcutaneous tissue: Secondary | ICD-10-CM | POA: Diagnosis not present

## 2019-12-29 DIAGNOSIS — C44319 Basal cell carcinoma of skin of other parts of face: Secondary | ICD-10-CM | POA: Diagnosis not present

## 2019-12-29 DIAGNOSIS — D2262 Melanocytic nevi of left upper limb, including shoulder: Secondary | ICD-10-CM | POA: Diagnosis not present

## 2019-12-29 DIAGNOSIS — L821 Other seborrheic keratosis: Secondary | ICD-10-CM | POA: Diagnosis not present

## 2019-12-29 DIAGNOSIS — D2272 Melanocytic nevi of left lower limb, including hip: Secondary | ICD-10-CM | POA: Diagnosis not present

## 2019-12-29 DIAGNOSIS — Z85828 Personal history of other malignant neoplasm of skin: Secondary | ICD-10-CM | POA: Diagnosis not present

## 2019-12-29 DIAGNOSIS — L814 Other melanin hyperpigmentation: Secondary | ICD-10-CM | POA: Diagnosis not present

## 2019-12-29 DIAGNOSIS — D2261 Melanocytic nevi of right upper limb, including shoulder: Secondary | ICD-10-CM | POA: Diagnosis not present

## 2020-01-14 DIAGNOSIS — C44319 Basal cell carcinoma of skin of other parts of face: Secondary | ICD-10-CM | POA: Diagnosis not present

## 2020-01-14 DIAGNOSIS — Z85828 Personal history of other malignant neoplasm of skin: Secondary | ICD-10-CM | POA: Diagnosis not present

## 2020-02-06 DIAGNOSIS — R350 Frequency of micturition: Secondary | ICD-10-CM | POA: Diagnosis not present

## 2020-03-09 DIAGNOSIS — R972 Elevated prostate specific antigen [PSA]: Secondary | ICD-10-CM | POA: Diagnosis not present

## 2020-03-09 DIAGNOSIS — E7800 Pure hypercholesterolemia, unspecified: Secondary | ICD-10-CM | POA: Diagnosis not present

## 2020-03-09 DIAGNOSIS — Z125 Encounter for screening for malignant neoplasm of prostate: Secondary | ICD-10-CM | POA: Diagnosis not present

## 2020-03-12 DIAGNOSIS — R82998 Other abnormal findings in urine: Secondary | ICD-10-CM | POA: Diagnosis not present

## 2020-03-16 DIAGNOSIS — Z0000 Encounter for general adult medical examination without abnormal findings: Secondary | ICD-10-CM | POA: Diagnosis not present

## 2020-03-16 DIAGNOSIS — Z905 Acquired absence of kidney: Secondary | ICD-10-CM | POA: Diagnosis not present

## 2020-03-16 DIAGNOSIS — N411 Chronic prostatitis: Secondary | ICD-10-CM | POA: Diagnosis not present

## 2020-03-16 DIAGNOSIS — Z9114 Patient's other noncompliance with medication regimen: Secondary | ICD-10-CM | POA: Diagnosis not present

## 2020-03-16 DIAGNOSIS — R972 Elevated prostate specific antigen [PSA]: Secondary | ICD-10-CM | POA: Diagnosis not present

## 2020-03-16 DIAGNOSIS — C679 Malignant neoplasm of bladder, unspecified: Secondary | ICD-10-CM | POA: Diagnosis not present

## 2020-03-16 DIAGNOSIS — E7800 Pure hypercholesterolemia, unspecified: Secondary | ICD-10-CM | POA: Diagnosis not present

## 2020-03-16 DIAGNOSIS — M179 Osteoarthritis of knee, unspecified: Secondary | ICD-10-CM | POA: Diagnosis not present

## 2020-03-16 DIAGNOSIS — N1831 Chronic kidney disease, stage 3a: Secondary | ICD-10-CM | POA: Diagnosis not present

## 2020-03-16 DIAGNOSIS — C649 Malignant neoplasm of unspecified kidney, except renal pelvis: Secondary | ICD-10-CM | POA: Diagnosis not present

## 2020-03-16 DIAGNOSIS — M419 Scoliosis, unspecified: Secondary | ICD-10-CM | POA: Diagnosis not present

## 2020-03-16 DIAGNOSIS — N401 Enlarged prostate with lower urinary tract symptoms: Secondary | ICD-10-CM | POA: Diagnosis not present

## 2020-03-18 DIAGNOSIS — Z1212 Encounter for screening for malignant neoplasm of rectum: Secondary | ICD-10-CM | POA: Diagnosis not present

## 2020-03-23 DIAGNOSIS — C679 Malignant neoplasm of bladder, unspecified: Secondary | ICD-10-CM | POA: Diagnosis not present

## 2020-03-23 DIAGNOSIS — D4111 Neoplasm of uncertain behavior of right renal pelvis: Secondary | ICD-10-CM | POA: Diagnosis not present

## 2020-03-23 DIAGNOSIS — C678 Malignant neoplasm of overlapping sites of bladder: Secondary | ICD-10-CM | POA: Diagnosis not present

## 2020-03-30 DIAGNOSIS — C678 Malignant neoplasm of overlapping sites of bladder: Secondary | ICD-10-CM | POA: Diagnosis not present

## 2020-03-30 DIAGNOSIS — N5201 Erectile dysfunction due to arterial insufficiency: Secondary | ICD-10-CM | POA: Diagnosis not present

## 2020-03-30 DIAGNOSIS — R350 Frequency of micturition: Secondary | ICD-10-CM | POA: Diagnosis not present

## 2020-03-30 DIAGNOSIS — N401 Enlarged prostate with lower urinary tract symptoms: Secondary | ICD-10-CM | POA: Diagnosis not present

## 2020-04-15 DIAGNOSIS — C678 Malignant neoplasm of overlapping sites of bladder: Secondary | ICD-10-CM | POA: Diagnosis not present

## 2020-04-15 DIAGNOSIS — Z5111 Encounter for antineoplastic chemotherapy: Secondary | ICD-10-CM | POA: Diagnosis not present

## 2020-04-22 DIAGNOSIS — C678 Malignant neoplasm of overlapping sites of bladder: Secondary | ICD-10-CM | POA: Diagnosis not present

## 2020-04-22 DIAGNOSIS — Z5111 Encounter for antineoplastic chemotherapy: Secondary | ICD-10-CM | POA: Diagnosis not present

## 2020-04-29 DIAGNOSIS — Z5111 Encounter for antineoplastic chemotherapy: Secondary | ICD-10-CM | POA: Diagnosis not present

## 2020-04-29 DIAGNOSIS — C678 Malignant neoplasm of overlapping sites of bladder: Secondary | ICD-10-CM | POA: Diagnosis not present

## 2020-06-28 DIAGNOSIS — L821 Other seborrheic keratosis: Secondary | ICD-10-CM | POA: Diagnosis not present

## 2020-06-28 DIAGNOSIS — D2271 Melanocytic nevi of right lower limb, including hip: Secondary | ICD-10-CM | POA: Diagnosis not present

## 2020-06-28 DIAGNOSIS — Z85828 Personal history of other malignant neoplasm of skin: Secondary | ICD-10-CM | POA: Diagnosis not present

## 2020-06-28 DIAGNOSIS — L570 Actinic keratosis: Secondary | ICD-10-CM | POA: Diagnosis not present

## 2020-06-28 DIAGNOSIS — D2272 Melanocytic nevi of left lower limb, including hip: Secondary | ICD-10-CM | POA: Diagnosis not present

## 2020-06-28 DIAGNOSIS — D225 Melanocytic nevi of trunk: Secondary | ICD-10-CM | POA: Diagnosis not present

## 2020-06-28 DIAGNOSIS — L814 Other melanin hyperpigmentation: Secondary | ICD-10-CM | POA: Diagnosis not present

## 2020-06-28 DIAGNOSIS — D1801 Hemangioma of skin and subcutaneous tissue: Secondary | ICD-10-CM | POA: Diagnosis not present

## 2020-07-06 DIAGNOSIS — Z23 Encounter for immunization: Secondary | ICD-10-CM | POA: Diagnosis not present

## 2020-08-02 DIAGNOSIS — Z23 Encounter for immunization: Secondary | ICD-10-CM | POA: Diagnosis not present

## 2020-08-30 DIAGNOSIS — Z961 Presence of intraocular lens: Secondary | ICD-10-CM | POA: Diagnosis not present

## 2020-09-27 DIAGNOSIS — R972 Elevated prostate specific antigen [PSA]: Secondary | ICD-10-CM | POA: Diagnosis not present

## 2020-09-27 DIAGNOSIS — N5201 Erectile dysfunction due to arterial insufficiency: Secondary | ICD-10-CM | POA: Diagnosis not present

## 2020-09-27 DIAGNOSIS — N401 Enlarged prostate with lower urinary tract symptoms: Secondary | ICD-10-CM | POA: Diagnosis not present

## 2020-09-27 DIAGNOSIS — C678 Malignant neoplasm of overlapping sites of bladder: Secondary | ICD-10-CM | POA: Diagnosis not present

## 2020-09-27 DIAGNOSIS — R350 Frequency of micturition: Secondary | ICD-10-CM | POA: Diagnosis not present

## 2020-11-02 DIAGNOSIS — Z20822 Contact with and (suspected) exposure to covid-19: Secondary | ICD-10-CM | POA: Diagnosis not present

## 2020-11-24 DIAGNOSIS — M25512 Pain in left shoulder: Secondary | ICD-10-CM | POA: Diagnosis not present

## 2020-12-02 DIAGNOSIS — M25512 Pain in left shoulder: Secondary | ICD-10-CM | POA: Diagnosis not present

## 2020-12-06 DIAGNOSIS — S46012A Strain of muscle(s) and tendon(s) of the rotator cuff of left shoulder, initial encounter: Secondary | ICD-10-CM | POA: Diagnosis not present

## 2020-12-16 DIAGNOSIS — R350 Frequency of micturition: Secondary | ICD-10-CM | POA: Diagnosis not present

## 2020-12-24 DIAGNOSIS — R350 Frequency of micturition: Secondary | ICD-10-CM | POA: Diagnosis not present

## 2020-12-30 ENCOUNTER — Other Ambulatory Visit (HOSPITAL_COMMUNITY): Payer: Self-pay | Admitting: Internal Medicine

## 2020-12-30 MED FILL — MOLNUPIRAVIR 200 MG CAPS: 200 | 5 days supply | Qty: 40 | Fill #0

## 2021-01-11 DIAGNOSIS — Z85828 Personal history of other malignant neoplasm of skin: Secondary | ICD-10-CM | POA: Diagnosis not present

## 2021-01-11 DIAGNOSIS — C44719 Basal cell carcinoma of skin of left lower limb, including hip: Secondary | ICD-10-CM | POA: Diagnosis not present

## 2021-01-11 DIAGNOSIS — D485 Neoplasm of uncertain behavior of skin: Secondary | ICD-10-CM | POA: Diagnosis not present

## 2021-01-11 DIAGNOSIS — D2271 Melanocytic nevi of right lower limb, including hip: Secondary | ICD-10-CM | POA: Diagnosis not present

## 2021-01-11 DIAGNOSIS — L858 Other specified epidermal thickening: Secondary | ICD-10-CM | POA: Diagnosis not present

## 2021-01-11 DIAGNOSIS — L821 Other seborrheic keratosis: Secondary | ICD-10-CM | POA: Diagnosis not present

## 2021-01-11 DIAGNOSIS — L570 Actinic keratosis: Secondary | ICD-10-CM | POA: Diagnosis not present

## 2021-01-11 DIAGNOSIS — B078 Other viral warts: Secondary | ICD-10-CM | POA: Diagnosis not present

## 2021-01-11 DIAGNOSIS — L814 Other melanin hyperpigmentation: Secondary | ICD-10-CM | POA: Diagnosis not present

## 2021-01-11 DIAGNOSIS — D1801 Hemangioma of skin and subcutaneous tissue: Secondary | ICD-10-CM | POA: Diagnosis not present

## 2021-02-09 DIAGNOSIS — Z23 Encounter for immunization: Secondary | ICD-10-CM | POA: Diagnosis not present

## 2021-03-03 DIAGNOSIS — X58XXXA Exposure to other specified factors, initial encounter: Secondary | ICD-10-CM | POA: Diagnosis not present

## 2021-03-03 DIAGNOSIS — M948X1 Other specified disorders of cartilage, shoulder: Secondary | ICD-10-CM | POA: Diagnosis not present

## 2021-03-03 DIAGNOSIS — Y999 Unspecified external cause status: Secondary | ICD-10-CM | POA: Diagnosis not present

## 2021-03-03 DIAGNOSIS — S43432A Superior glenoid labrum lesion of left shoulder, initial encounter: Secondary | ICD-10-CM | POA: Diagnosis not present

## 2021-03-03 DIAGNOSIS — M19012 Primary osteoarthritis, left shoulder: Secondary | ICD-10-CM | POA: Diagnosis not present

## 2021-03-03 DIAGNOSIS — M75122 Complete rotator cuff tear or rupture of left shoulder, not specified as traumatic: Secondary | ICD-10-CM | POA: Diagnosis not present

## 2021-03-03 DIAGNOSIS — M7542 Impingement syndrome of left shoulder: Secondary | ICD-10-CM | POA: Diagnosis not present

## 2021-03-03 DIAGNOSIS — G8918 Other acute postprocedural pain: Secondary | ICD-10-CM | POA: Diagnosis not present

## 2021-03-03 DIAGNOSIS — M24112 Other articular cartilage disorders, left shoulder: Secondary | ICD-10-CM | POA: Diagnosis not present

## 2021-03-16 DIAGNOSIS — M19012 Primary osteoarthritis, left shoulder: Secondary | ICD-10-CM | POA: Diagnosis not present

## 2021-03-18 DIAGNOSIS — Z79899 Other long term (current) drug therapy: Secondary | ICD-10-CM | POA: Diagnosis not present

## 2021-03-18 DIAGNOSIS — E7800 Pure hypercholesterolemia, unspecified: Secondary | ICD-10-CM | POA: Diagnosis not present

## 2021-03-18 DIAGNOSIS — Z125 Encounter for screening for malignant neoplasm of prostate: Secondary | ICD-10-CM | POA: Diagnosis not present

## 2021-03-25 DIAGNOSIS — Z0000 Encounter for general adult medical examination without abnormal findings: Secondary | ICD-10-CM | POA: Diagnosis not present

## 2021-03-25 DIAGNOSIS — C679 Malignant neoplasm of bladder, unspecified: Secondary | ICD-10-CM | POA: Diagnosis not present

## 2021-03-25 DIAGNOSIS — Z8616 Personal history of COVID-19: Secondary | ICD-10-CM | POA: Diagnosis not present

## 2021-03-25 DIAGNOSIS — Z905 Acquired absence of kidney: Secondary | ICD-10-CM | POA: Diagnosis not present

## 2021-03-25 DIAGNOSIS — N411 Chronic prostatitis: Secondary | ICD-10-CM | POA: Diagnosis not present

## 2021-03-25 DIAGNOSIS — E7800 Pure hypercholesterolemia, unspecified: Secondary | ICD-10-CM | POA: Diagnosis not present

## 2021-03-25 DIAGNOSIS — Z1212 Encounter for screening for malignant neoplasm of rectum: Secondary | ICD-10-CM | POA: Diagnosis not present

## 2021-03-25 DIAGNOSIS — C649 Malignant neoplasm of unspecified kidney, except renal pelvis: Secondary | ICD-10-CM | POA: Diagnosis not present

## 2021-03-25 DIAGNOSIS — Z1389 Encounter for screening for other disorder: Secondary | ICD-10-CM | POA: Diagnosis not present

## 2021-03-25 DIAGNOSIS — R972 Elevated prostate specific antigen [PSA]: Secondary | ICD-10-CM | POA: Diagnosis not present

## 2021-03-25 DIAGNOSIS — M419 Scoliosis, unspecified: Secondary | ICD-10-CM | POA: Diagnosis not present

## 2021-03-25 DIAGNOSIS — Z1331 Encounter for screening for depression: Secondary | ICD-10-CM | POA: Diagnosis not present

## 2021-03-25 DIAGNOSIS — N401 Enlarged prostate with lower urinary tract symptoms: Secondary | ICD-10-CM | POA: Diagnosis not present

## 2021-03-28 DIAGNOSIS — D3000 Benign neoplasm of unspecified kidney: Secondary | ICD-10-CM | POA: Diagnosis not present

## 2021-03-28 DIAGNOSIS — C678 Malignant neoplasm of overlapping sites of bladder: Secondary | ICD-10-CM | POA: Diagnosis not present

## 2021-04-13 DIAGNOSIS — M75122 Complete rotator cuff tear or rupture of left shoulder, not specified as traumatic: Secondary | ICD-10-CM | POA: Diagnosis not present

## 2021-04-19 DIAGNOSIS — M25612 Stiffness of left shoulder, not elsewhere classified: Secondary | ICD-10-CM | POA: Diagnosis not present

## 2021-04-19 DIAGNOSIS — M25512 Pain in left shoulder: Secondary | ICD-10-CM | POA: Diagnosis not present

## 2021-04-19 DIAGNOSIS — M75122 Complete rotator cuff tear or rupture of left shoulder, not specified as traumatic: Secondary | ICD-10-CM | POA: Diagnosis not present

## 2021-04-19 DIAGNOSIS — M6281 Muscle weakness (generalized): Secondary | ICD-10-CM | POA: Diagnosis not present

## 2021-04-21 DIAGNOSIS — M25512 Pain in left shoulder: Secondary | ICD-10-CM | POA: Diagnosis not present

## 2021-04-21 DIAGNOSIS — M6281 Muscle weakness (generalized): Secondary | ICD-10-CM | POA: Diagnosis not present

## 2021-04-21 DIAGNOSIS — M25612 Stiffness of left shoulder, not elsewhere classified: Secondary | ICD-10-CM | POA: Diagnosis not present

## 2021-04-21 DIAGNOSIS — M75122 Complete rotator cuff tear or rupture of left shoulder, not specified as traumatic: Secondary | ICD-10-CM | POA: Diagnosis not present

## 2021-04-26 DIAGNOSIS — M25512 Pain in left shoulder: Secondary | ICD-10-CM | POA: Diagnosis not present

## 2021-04-26 DIAGNOSIS — M6281 Muscle weakness (generalized): Secondary | ICD-10-CM | POA: Diagnosis not present

## 2021-04-26 DIAGNOSIS — M25612 Stiffness of left shoulder, not elsewhere classified: Secondary | ICD-10-CM | POA: Diagnosis not present

## 2021-04-26 DIAGNOSIS — M75122 Complete rotator cuff tear or rupture of left shoulder, not specified as traumatic: Secondary | ICD-10-CM | POA: Diagnosis not present

## 2021-04-29 DIAGNOSIS — M75122 Complete rotator cuff tear or rupture of left shoulder, not specified as traumatic: Secondary | ICD-10-CM | POA: Diagnosis not present

## 2021-04-29 DIAGNOSIS — M25512 Pain in left shoulder: Secondary | ICD-10-CM | POA: Diagnosis not present

## 2021-04-29 DIAGNOSIS — M6281 Muscle weakness (generalized): Secondary | ICD-10-CM | POA: Diagnosis not present

## 2021-04-29 DIAGNOSIS — M25612 Stiffness of left shoulder, not elsewhere classified: Secondary | ICD-10-CM | POA: Diagnosis not present

## 2021-05-03 DIAGNOSIS — M25612 Stiffness of left shoulder, not elsewhere classified: Secondary | ICD-10-CM | POA: Diagnosis not present

## 2021-05-03 DIAGNOSIS — M25512 Pain in left shoulder: Secondary | ICD-10-CM | POA: Diagnosis not present

## 2021-05-03 DIAGNOSIS — M6281 Muscle weakness (generalized): Secondary | ICD-10-CM | POA: Diagnosis not present

## 2021-05-03 DIAGNOSIS — M75122 Complete rotator cuff tear or rupture of left shoulder, not specified as traumatic: Secondary | ICD-10-CM | POA: Diagnosis not present

## 2021-05-05 DIAGNOSIS — M25512 Pain in left shoulder: Secondary | ICD-10-CM | POA: Diagnosis not present

## 2021-05-05 DIAGNOSIS — M75122 Complete rotator cuff tear or rupture of left shoulder, not specified as traumatic: Secondary | ICD-10-CM | POA: Diagnosis not present

## 2021-05-05 DIAGNOSIS — M6281 Muscle weakness (generalized): Secondary | ICD-10-CM | POA: Diagnosis not present

## 2021-05-05 DIAGNOSIS — M25612 Stiffness of left shoulder, not elsewhere classified: Secondary | ICD-10-CM | POA: Diagnosis not present

## 2021-05-09 DIAGNOSIS — M25512 Pain in left shoulder: Secondary | ICD-10-CM | POA: Diagnosis not present

## 2021-05-20 DIAGNOSIS — M25512 Pain in left shoulder: Secondary | ICD-10-CM | POA: Diagnosis not present

## 2021-05-20 DIAGNOSIS — M75122 Complete rotator cuff tear or rupture of left shoulder, not specified as traumatic: Secondary | ICD-10-CM | POA: Diagnosis not present

## 2021-05-20 DIAGNOSIS — M25612 Stiffness of left shoulder, not elsewhere classified: Secondary | ICD-10-CM | POA: Diagnosis not present

## 2021-05-20 DIAGNOSIS — M6281 Muscle weakness (generalized): Secondary | ICD-10-CM | POA: Diagnosis not present

## 2021-05-24 DIAGNOSIS — M25512 Pain in left shoulder: Secondary | ICD-10-CM | POA: Diagnosis not present

## 2021-05-24 DIAGNOSIS — M75122 Complete rotator cuff tear or rupture of left shoulder, not specified as traumatic: Secondary | ICD-10-CM | POA: Diagnosis not present

## 2021-05-24 DIAGNOSIS — M25612 Stiffness of left shoulder, not elsewhere classified: Secondary | ICD-10-CM | POA: Diagnosis not present

## 2021-05-24 DIAGNOSIS — M6281 Muscle weakness (generalized): Secondary | ICD-10-CM | POA: Diagnosis not present

## 2021-05-27 DIAGNOSIS — M25512 Pain in left shoulder: Secondary | ICD-10-CM | POA: Diagnosis not present

## 2021-05-27 DIAGNOSIS — M25612 Stiffness of left shoulder, not elsewhere classified: Secondary | ICD-10-CM | POA: Diagnosis not present

## 2021-05-27 DIAGNOSIS — M75122 Complete rotator cuff tear or rupture of left shoulder, not specified as traumatic: Secondary | ICD-10-CM | POA: Diagnosis not present

## 2021-05-27 DIAGNOSIS — M6281 Muscle weakness (generalized): Secondary | ICD-10-CM | POA: Diagnosis not present

## 2021-05-30 DIAGNOSIS — M25612 Stiffness of left shoulder, not elsewhere classified: Secondary | ICD-10-CM | POA: Diagnosis not present

## 2021-05-30 DIAGNOSIS — M6281 Muscle weakness (generalized): Secondary | ICD-10-CM | POA: Diagnosis not present

## 2021-05-30 DIAGNOSIS — M25512 Pain in left shoulder: Secondary | ICD-10-CM | POA: Diagnosis not present

## 2021-05-30 DIAGNOSIS — M75122 Complete rotator cuff tear or rupture of left shoulder, not specified as traumatic: Secondary | ICD-10-CM | POA: Diagnosis not present

## 2021-06-01 DIAGNOSIS — M75122 Complete rotator cuff tear or rupture of left shoulder, not specified as traumatic: Secondary | ICD-10-CM | POA: Diagnosis not present

## 2021-06-01 DIAGNOSIS — M25612 Stiffness of left shoulder, not elsewhere classified: Secondary | ICD-10-CM | POA: Diagnosis not present

## 2021-06-01 DIAGNOSIS — M6281 Muscle weakness (generalized): Secondary | ICD-10-CM | POA: Diagnosis not present

## 2021-06-01 DIAGNOSIS — M25512 Pain in left shoulder: Secondary | ICD-10-CM | POA: Diagnosis not present

## 2021-06-06 DIAGNOSIS — M25512 Pain in left shoulder: Secondary | ICD-10-CM | POA: Diagnosis not present

## 2021-06-14 DIAGNOSIS — M25512 Pain in left shoulder: Secondary | ICD-10-CM | POA: Diagnosis not present

## 2021-06-14 DIAGNOSIS — M6281 Muscle weakness (generalized): Secondary | ICD-10-CM | POA: Diagnosis not present

## 2021-06-14 DIAGNOSIS — M25612 Stiffness of left shoulder, not elsewhere classified: Secondary | ICD-10-CM | POA: Diagnosis not present

## 2021-06-14 DIAGNOSIS — M75122 Complete rotator cuff tear or rupture of left shoulder, not specified as traumatic: Secondary | ICD-10-CM | POA: Diagnosis not present

## 2021-06-17 DIAGNOSIS — M25512 Pain in left shoulder: Secondary | ICD-10-CM | POA: Diagnosis not present

## 2021-06-17 DIAGNOSIS — M75122 Complete rotator cuff tear or rupture of left shoulder, not specified as traumatic: Secondary | ICD-10-CM | POA: Diagnosis not present

## 2021-06-17 DIAGNOSIS — M25612 Stiffness of left shoulder, not elsewhere classified: Secondary | ICD-10-CM | POA: Diagnosis not present

## 2021-06-17 DIAGNOSIS — M6281 Muscle weakness (generalized): Secondary | ICD-10-CM | POA: Diagnosis not present

## 2021-06-22 DIAGNOSIS — M25512 Pain in left shoulder: Secondary | ICD-10-CM | POA: Diagnosis not present

## 2021-06-22 DIAGNOSIS — M75122 Complete rotator cuff tear or rupture of left shoulder, not specified as traumatic: Secondary | ICD-10-CM | POA: Diagnosis not present

## 2021-06-22 DIAGNOSIS — M25612 Stiffness of left shoulder, not elsewhere classified: Secondary | ICD-10-CM | POA: Diagnosis not present

## 2021-06-22 DIAGNOSIS — M6281 Muscle weakness (generalized): Secondary | ICD-10-CM | POA: Diagnosis not present

## 2021-06-23 DIAGNOSIS — U071 COVID-19: Secondary | ICD-10-CM | POA: Diagnosis not present

## 2021-06-23 DIAGNOSIS — Z23 Encounter for immunization: Secondary | ICD-10-CM | POA: Diagnosis not present

## 2021-06-24 DIAGNOSIS — M25512 Pain in left shoulder: Secondary | ICD-10-CM | POA: Diagnosis not present

## 2021-06-24 DIAGNOSIS — M25612 Stiffness of left shoulder, not elsewhere classified: Secondary | ICD-10-CM | POA: Diagnosis not present

## 2021-06-24 DIAGNOSIS — M75122 Complete rotator cuff tear or rupture of left shoulder, not specified as traumatic: Secondary | ICD-10-CM | POA: Diagnosis not present

## 2021-06-24 DIAGNOSIS — M6281 Muscle weakness (generalized): Secondary | ICD-10-CM | POA: Diagnosis not present

## 2021-06-27 DIAGNOSIS — M6281 Muscle weakness (generalized): Secondary | ICD-10-CM | POA: Diagnosis not present

## 2021-06-27 DIAGNOSIS — M25512 Pain in left shoulder: Secondary | ICD-10-CM | POA: Diagnosis not present

## 2021-06-27 DIAGNOSIS — M25612 Stiffness of left shoulder, not elsewhere classified: Secondary | ICD-10-CM | POA: Diagnosis not present

## 2021-06-27 DIAGNOSIS — M75122 Complete rotator cuff tear or rupture of left shoulder, not specified as traumatic: Secondary | ICD-10-CM | POA: Diagnosis not present

## 2021-06-30 DIAGNOSIS — M25512 Pain in left shoulder: Secondary | ICD-10-CM | POA: Diagnosis not present

## 2021-06-30 DIAGNOSIS — M75122 Complete rotator cuff tear or rupture of left shoulder, not specified as traumatic: Secondary | ICD-10-CM | POA: Diagnosis not present

## 2021-06-30 DIAGNOSIS — M25612 Stiffness of left shoulder, not elsewhere classified: Secondary | ICD-10-CM | POA: Diagnosis not present

## 2021-06-30 DIAGNOSIS — M6281 Muscle weakness (generalized): Secondary | ICD-10-CM | POA: Diagnosis not present

## 2021-07-11 DIAGNOSIS — M25512 Pain in left shoulder: Secondary | ICD-10-CM | POA: Diagnosis not present

## 2021-07-11 DIAGNOSIS — M75122 Complete rotator cuff tear or rupture of left shoulder, not specified as traumatic: Secondary | ICD-10-CM | POA: Diagnosis not present

## 2021-07-11 DIAGNOSIS — M6281 Muscle weakness (generalized): Secondary | ICD-10-CM | POA: Diagnosis not present

## 2021-07-11 DIAGNOSIS — M25612 Stiffness of left shoulder, not elsewhere classified: Secondary | ICD-10-CM | POA: Diagnosis not present

## 2021-07-13 DIAGNOSIS — M6281 Muscle weakness (generalized): Secondary | ICD-10-CM | POA: Diagnosis not present

## 2021-07-13 DIAGNOSIS — M75122 Complete rotator cuff tear or rupture of left shoulder, not specified as traumatic: Secondary | ICD-10-CM | POA: Diagnosis not present

## 2021-07-13 DIAGNOSIS — M25512 Pain in left shoulder: Secondary | ICD-10-CM | POA: Diagnosis not present

## 2021-07-13 DIAGNOSIS — M25612 Stiffness of left shoulder, not elsewhere classified: Secondary | ICD-10-CM | POA: Diagnosis not present

## 2021-07-14 DIAGNOSIS — L821 Other seborrheic keratosis: Secondary | ICD-10-CM | POA: Diagnosis not present

## 2021-07-14 DIAGNOSIS — D2261 Melanocytic nevi of right upper limb, including shoulder: Secondary | ICD-10-CM | POA: Diagnosis not present

## 2021-07-14 DIAGNOSIS — D2239 Melanocytic nevi of other parts of face: Secondary | ICD-10-CM | POA: Diagnosis not present

## 2021-07-14 DIAGNOSIS — D2272 Melanocytic nevi of left lower limb, including hip: Secondary | ICD-10-CM | POA: Diagnosis not present

## 2021-07-14 DIAGNOSIS — L814 Other melanin hyperpigmentation: Secondary | ICD-10-CM | POA: Diagnosis not present

## 2021-07-14 DIAGNOSIS — D225 Melanocytic nevi of trunk: Secondary | ICD-10-CM | POA: Diagnosis not present

## 2021-07-14 DIAGNOSIS — D1801 Hemangioma of skin and subcutaneous tissue: Secondary | ICD-10-CM | POA: Diagnosis not present

## 2021-07-14 DIAGNOSIS — D2262 Melanocytic nevi of left upper limb, including shoulder: Secondary | ICD-10-CM | POA: Diagnosis not present

## 2021-07-14 DIAGNOSIS — Z85828 Personal history of other malignant neoplasm of skin: Secondary | ICD-10-CM | POA: Diagnosis not present

## 2021-07-14 DIAGNOSIS — D2271 Melanocytic nevi of right lower limb, including hip: Secondary | ICD-10-CM | POA: Diagnosis not present

## 2021-07-20 DIAGNOSIS — M6281 Muscle weakness (generalized): Secondary | ICD-10-CM | POA: Diagnosis not present

## 2021-07-25 DIAGNOSIS — Z23 Encounter for immunization: Secondary | ICD-10-CM | POA: Diagnosis not present

## 2021-08-16 DIAGNOSIS — R350 Frequency of micturition: Secondary | ICD-10-CM | POA: Diagnosis not present

## 2021-09-01 DIAGNOSIS — Z961 Presence of intraocular lens: Secondary | ICD-10-CM | POA: Diagnosis not present

## 2021-10-11 DIAGNOSIS — C678 Malignant neoplasm of overlapping sites of bladder: Secondary | ICD-10-CM | POA: Diagnosis not present

## 2022-01-04 DIAGNOSIS — Z20822 Contact with and (suspected) exposure to covid-19: Secondary | ICD-10-CM | POA: Diagnosis not present

## 2022-01-11 DIAGNOSIS — L821 Other seborrheic keratosis: Secondary | ICD-10-CM | POA: Diagnosis not present

## 2022-01-11 DIAGNOSIS — Z85828 Personal history of other malignant neoplasm of skin: Secondary | ICD-10-CM | POA: Diagnosis not present

## 2022-01-11 DIAGNOSIS — D2271 Melanocytic nevi of right lower limb, including hip: Secondary | ICD-10-CM | POA: Diagnosis not present

## 2022-01-11 DIAGNOSIS — D1801 Hemangioma of skin and subcutaneous tissue: Secondary | ICD-10-CM | POA: Diagnosis not present

## 2022-01-11 DIAGNOSIS — C44612 Basal cell carcinoma of skin of right upper limb, including shoulder: Secondary | ICD-10-CM | POA: Diagnosis not present

## 2022-01-11 DIAGNOSIS — D2272 Melanocytic nevi of left lower limb, including hip: Secondary | ICD-10-CM | POA: Diagnosis not present

## 2022-01-11 DIAGNOSIS — L814 Other melanin hyperpigmentation: Secondary | ICD-10-CM | POA: Diagnosis not present

## 2022-01-11 DIAGNOSIS — D225 Melanocytic nevi of trunk: Secondary | ICD-10-CM | POA: Diagnosis not present

## 2022-01-11 DIAGNOSIS — L570 Actinic keratosis: Secondary | ICD-10-CM | POA: Diagnosis not present

## 2022-01-30 DIAGNOSIS — J302 Other seasonal allergic rhinitis: Secondary | ICD-10-CM | POA: Diagnosis not present

## 2022-01-30 DIAGNOSIS — Q178 Other specified congenital malformations of ear: Secondary | ICD-10-CM | POA: Diagnosis not present

## 2022-03-06 DIAGNOSIS — Z20822 Contact with and (suspected) exposure to covid-19: Secondary | ICD-10-CM | POA: Diagnosis not present

## 2022-03-24 DIAGNOSIS — Z125 Encounter for screening for malignant neoplasm of prostate: Secondary | ICD-10-CM | POA: Diagnosis not present

## 2022-03-24 DIAGNOSIS — E7800 Pure hypercholesterolemia, unspecified: Secondary | ICD-10-CM | POA: Diagnosis not present

## 2022-03-24 DIAGNOSIS — Z79899 Other long term (current) drug therapy: Secondary | ICD-10-CM | POA: Diagnosis not present

## 2022-03-31 DIAGNOSIS — M419 Scoliosis, unspecified: Secondary | ICD-10-CM | POA: Diagnosis not present

## 2022-03-31 DIAGNOSIS — R972 Elevated prostate specific antigen [PSA]: Secondary | ICD-10-CM | POA: Diagnosis not present

## 2022-03-31 DIAGNOSIS — Z0000 Encounter for general adult medical examination without abnormal findings: Secondary | ICD-10-CM | POA: Diagnosis not present

## 2022-03-31 DIAGNOSIS — Z23 Encounter for immunization: Secondary | ICD-10-CM | POA: Diagnosis not present

## 2022-03-31 DIAGNOSIS — Z1331 Encounter for screening for depression: Secondary | ICD-10-CM | POA: Diagnosis not present

## 2022-03-31 DIAGNOSIS — E7800 Pure hypercholesterolemia, unspecified: Secondary | ICD-10-CM | POA: Diagnosis not present

## 2022-03-31 DIAGNOSIS — Z1339 Encounter for screening examination for other mental health and behavioral disorders: Secondary | ICD-10-CM | POA: Diagnosis not present

## 2022-03-31 DIAGNOSIS — R82998 Other abnormal findings in urine: Secondary | ICD-10-CM | POA: Diagnosis not present

## 2022-03-31 DIAGNOSIS — Z8616 Personal history of COVID-19: Secondary | ICD-10-CM | POA: Diagnosis not present

## 2022-03-31 DIAGNOSIS — N401 Enlarged prostate with lower urinary tract symptoms: Secondary | ICD-10-CM | POA: Diagnosis not present

## 2022-03-31 DIAGNOSIS — Z87442 Personal history of urinary calculi: Secondary | ICD-10-CM | POA: Diagnosis not present

## 2022-03-31 DIAGNOSIS — M179 Osteoarthritis of knee, unspecified: Secondary | ICD-10-CM | POA: Diagnosis not present

## 2022-03-31 DIAGNOSIS — Z905 Acquired absence of kidney: Secondary | ICD-10-CM | POA: Diagnosis not present

## 2022-04-04 DIAGNOSIS — N5201 Erectile dysfunction due to arterial insufficiency: Secondary | ICD-10-CM | POA: Diagnosis not present

## 2022-04-04 DIAGNOSIS — C678 Malignant neoplasm of overlapping sites of bladder: Secondary | ICD-10-CM | POA: Diagnosis not present

## 2022-05-29 DIAGNOSIS — L814 Other melanin hyperpigmentation: Secondary | ICD-10-CM | POA: Diagnosis not present

## 2022-05-29 DIAGNOSIS — C44519 Basal cell carcinoma of skin of other part of trunk: Secondary | ICD-10-CM | POA: Diagnosis not present

## 2022-05-29 DIAGNOSIS — D1801 Hemangioma of skin and subcutaneous tissue: Secondary | ICD-10-CM | POA: Diagnosis not present

## 2022-05-29 DIAGNOSIS — Z85828 Personal history of other malignant neoplasm of skin: Secondary | ICD-10-CM | POA: Diagnosis not present

## 2022-05-29 DIAGNOSIS — L821 Other seborrheic keratosis: Secondary | ICD-10-CM | POA: Diagnosis not present

## 2022-07-23 DIAGNOSIS — Z23 Encounter for immunization: Secondary | ICD-10-CM | POA: Diagnosis not present

## 2022-08-25 DIAGNOSIS — Z23 Encounter for immunization: Secondary | ICD-10-CM | POA: Diagnosis not present

## 2022-09-04 DIAGNOSIS — Z961 Presence of intraocular lens: Secondary | ICD-10-CM | POA: Diagnosis not present

## 2022-09-04 DIAGNOSIS — H43392 Other vitreous opacities, left eye: Secondary | ICD-10-CM | POA: Diagnosis not present

## 2022-11-13 DIAGNOSIS — R918 Other nonspecific abnormal finding of lung field: Secondary | ICD-10-CM

## 2022-11-13 DIAGNOSIS — R599 Enlarged lymph nodes, unspecified: Secondary | ICD-10-CM

## 2022-11-13 HISTORY — DX: Other nonspecific abnormal finding of lung field: R91.8

## 2022-11-13 HISTORY — DX: Enlarged lymph nodes, unspecified: R59.9

## 2022-11-20 ENCOUNTER — Other Ambulatory Visit (HOSPITAL_COMMUNITY): Payer: Self-pay | Admitting: Adult Health

## 2022-11-20 DIAGNOSIS — Z85528 Personal history of other malignant neoplasm of kidney: Secondary | ICD-10-CM | POA: Diagnosis not present

## 2022-11-20 DIAGNOSIS — R221 Localized swelling, mass and lump, neck: Secondary | ICD-10-CM

## 2022-11-20 DIAGNOSIS — R590 Localized enlarged lymph nodes: Secondary | ICD-10-CM | POA: Diagnosis not present

## 2022-11-21 ENCOUNTER — Ambulatory Visit (HOSPITAL_COMMUNITY): Payer: Medicare Other

## 2022-11-21 ENCOUNTER — Other Ambulatory Visit (HOSPITAL_COMMUNITY): Payer: Self-pay | Admitting: Internal Medicine

## 2022-11-21 ENCOUNTER — Ambulatory Visit (HOSPITAL_COMMUNITY)
Admission: RE | Admit: 2022-11-21 | Discharge: 2022-11-21 | Disposition: A | Discharge status: Home / Self Care | Payer: Medicare Other | Source: Ambulatory Visit | Attending: Adult Health | Admitting: Adult Health

## 2022-11-21 DIAGNOSIS — D49519 Neoplasm of unspecified behavior of unspecified kidney: Secondary | ICD-10-CM

## 2022-11-21 DIAGNOSIS — R221 Localized swelling, mass and lump, neck: Secondary | ICD-10-CM | POA: Diagnosis not present

## 2022-11-21 DIAGNOSIS — R590 Localized enlarged lymph nodes: Secondary | ICD-10-CM | POA: Diagnosis not present

## 2022-11-22 ENCOUNTER — Ambulatory Visit (HOSPITAL_BASED_OUTPATIENT_CLINIC_OR_DEPARTMENT_OTHER)
Admission: RE | Admit: 2022-11-22 | Discharge: 2022-11-22 | Disposition: A | Discharge status: Home / Self Care | Payer: Medicare Other | Source: Ambulatory Visit | Attending: Internal Medicine | Admitting: Internal Medicine

## 2022-11-22 ENCOUNTER — Encounter (HOSPITAL_BASED_OUTPATIENT_CLINIC_OR_DEPARTMENT_OTHER): Payer: Self-pay

## 2022-11-22 DIAGNOSIS — K5730 Diverticulosis of large intestine without perforation or abscess without bleeding: Secondary | ICD-10-CM | POA: Diagnosis not present

## 2022-11-22 DIAGNOSIS — D49519 Neoplasm of unspecified behavior of unspecified kidney: Secondary | ICD-10-CM | POA: Insufficient documentation

## 2022-11-22 DIAGNOSIS — K8020 Calculus of gallbladder without cholecystitis without obstruction: Secondary | ICD-10-CM | POA: Diagnosis not present

## 2022-11-22 DIAGNOSIS — C679 Malignant neoplasm of bladder, unspecified: Secondary | ICD-10-CM | POA: Diagnosis not present

## 2022-11-22 DIAGNOSIS — R918 Other nonspecific abnormal finding of lung field: Secondary | ICD-10-CM | POA: Diagnosis not present

## 2022-11-22 LAB — POCT I-STAT CREATININE: Creatinine, Ser: 1.1 mg/dL (ref 0.61–1.24)

## 2022-11-22 MED ORDER — IOHEXOL 300 MG/ML  SOLN
100.0000 mL | Freq: Once | INTRAMUSCULAR | Status: AC | PRN
  Administered 2022-11-22: 80 mL via INTRAVENOUS

## 2022-11-28 ENCOUNTER — Other Ambulatory Visit: Payer: Self-pay

## 2022-11-28 DIAGNOSIS — R911 Solitary pulmonary nodule: Secondary | ICD-10-CM

## 2022-11-28 NOTE — Progress Notes (Signed)
Today was my introductory phone call to the pt. I introduced myself and explained by role as navigator in the context of his care. I reviewed the date and time of his appointments. Pt has means of getting to his appointments. I inquired if he was going to bring someone with him, and he initially said no, and asked if he should. I explained in these types of appointments, it may be helpful to have someone else be able to hear what the doctor is saying, and also to bring a notebook to take notes write down questions. Pt states he has a friend he can bring along. Pt denied any questions or concerns at the moment. I told the pt I will meet him on his appointment on Thursday.

## 2022-11-29 ENCOUNTER — Other Ambulatory Visit: Payer: Self-pay

## 2022-11-29 DIAGNOSIS — D225 Melanocytic nevi of trunk: Secondary | ICD-10-CM | POA: Diagnosis not present

## 2022-11-29 DIAGNOSIS — D2271 Melanocytic nevi of right lower limb, including hip: Secondary | ICD-10-CM | POA: Diagnosis not present

## 2022-11-29 DIAGNOSIS — Z85828 Personal history of other malignant neoplasm of skin: Secondary | ICD-10-CM | POA: Diagnosis not present

## 2022-11-29 DIAGNOSIS — R911 Solitary pulmonary nodule: Secondary | ICD-10-CM

## 2022-11-29 DIAGNOSIS — L814 Other melanin hyperpigmentation: Secondary | ICD-10-CM | POA: Diagnosis not present

## 2022-11-29 DIAGNOSIS — D2261 Melanocytic nevi of right upper limb, including shoulder: Secondary | ICD-10-CM | POA: Diagnosis not present

## 2022-11-29 DIAGNOSIS — D2272 Melanocytic nevi of left lower limb, including hip: Secondary | ICD-10-CM | POA: Diagnosis not present

## 2022-11-29 DIAGNOSIS — L821 Other seborrheic keratosis: Secondary | ICD-10-CM | POA: Diagnosis not present

## 2022-11-29 DIAGNOSIS — D1801 Hemangioma of skin and subcutaneous tissue: Secondary | ICD-10-CM | POA: Diagnosis not present

## 2022-11-29 DIAGNOSIS — L820 Inflamed seborrheic keratosis: Secondary | ICD-10-CM | POA: Diagnosis not present

## 2022-11-30 ENCOUNTER — Inpatient Hospital Stay: Payer: Medicare Other

## 2022-11-30 ENCOUNTER — Inpatient Hospital Stay: Payer: Medicare Other | Attending: Internal Medicine | Admitting: Internal Medicine

## 2022-11-30 VITALS — BP 159/81 | HR 68 | Temp 98.8°F | Resp 15 | Ht 65.5 in | Wt 146.9 lb

## 2022-11-30 DIAGNOSIS — C3490 Malignant neoplasm of unspecified part of unspecified bronchus or lung: Secondary | ICD-10-CM | POA: Diagnosis not present

## 2022-11-30 DIAGNOSIS — R918 Other nonspecific abnormal finding of lung field: Secondary | ICD-10-CM | POA: Insufficient documentation

## 2022-11-30 DIAGNOSIS — Z8551 Personal history of malignant neoplasm of bladder: Secondary | ICD-10-CM | POA: Diagnosis not present

## 2022-11-30 LAB — COMPREHENSIVE METABOLIC PANEL
ALT: 17 U/L (ref 0–44)
AST: 22 U/L (ref 15–41)
Albumin: 3.9 g/dL (ref 3.5–5.0)
Alkaline Phosphatase: 65 U/L (ref 38–126)
Anion gap: 7 (ref 5–15)
BUN: 20 mg/dL (ref 8–23)
CO2: 26 mmol/L (ref 22–32)
Calcium: 8.9 mg/dL (ref 8.9–10.3)
Chloride: 107 mmol/L (ref 98–111)
Creatinine, Ser: 1.07 mg/dL (ref 0.61–1.24)
GFR, Estimated: >60 mL/min (ref >60)
Glucose, Bld: 100 mg/dL — ABNORMAL HIGH (ref 70–99)
Potassium: 3.9 mmol/L (ref 3.5–5.1)
Sodium: 140 mmol/L (ref 135–145)
Total Bilirubin: 0.7 mg/dL (ref 0.3–1.2)
Total Protein: 6.8 g/dL (ref 6.5–8.1)

## 2022-11-30 LAB — CBC WITH DIFFERENTIAL/PLATELET
Abs Immature Granulocytes: 0.03 10*3/uL (ref 0.00–0.07)
Basophils Absolute: 0 10*3/uL (ref 0.0–0.1)
Basophils Relative: 1 %
Eosinophils Absolute: 0.1 10*3/uL (ref 0.0–0.5)
Eosinophils Relative: 2 %
HCT: 43.5 % (ref 39.0–52.0)
Hemoglobin: 14.8 g/dL (ref 13.0–17.0)
Immature Granulocytes: 1 %
Lymphocytes Relative: 24 %
Lymphs Abs: 1.5 10*3/uL (ref 0.7–4.0)
MCH: 29 pg (ref 26.0–34.0)
MCHC: 34 g/dL (ref 30.0–36.0)
MCV: 85.1 fL (ref 80.0–100.0)
Monocytes Absolute: 0.6 10*3/uL (ref 0.1–1.0)
Monocytes Relative: 9 %
Neutro Abs: 4.1 10*3/uL (ref 1.7–7.7)
Neutrophils Relative %: 63 %
Platelets: 264 10*3/uL (ref 150–400)
RBC: 5.11 MIL/uL (ref 4.22–5.81)
RDW: 12.4 % (ref 11.5–15.5)
WBC: 6.4 10*3/uL (ref 4.0–10.5)
nRBC: 0 % (ref 0.0–0.2)

## 2022-11-30 NOTE — Progress Notes (Signed)
[facility] [phone]   [phone]  CONSULT NOTE  REFERRING PHYSICIAN: Dr. Guerry Bruin  REASON FOR CONSULTATION:  82 years old white male with suspicious lung nodules  HPI Alan Mendez is a 82 y.o. male with past medical history significant for anxiety/depression, dyslipidemia as well as history of right nephroureterectomy on November 15, 2016 by Dr. Marlou Porch and the final pathology showed in situ (noninvasive) low-grade papillary urothelial carcinoma measuring 4.0 cm involving the renal pelvis with benign simple cyst and all the margins of resection were negative for carcinoma.  The patient was also found to have similar findings in the bladder with biopsy showing focal invasive high-grade papillary urothelial carcinoma and the carcinoma invades the lamina propria at a level above the muscularis mucosa but the muscularis propria was present and not involved.  He was treated several times with intravesical BCG until November 2018.  He was doing fine and continued on observation but recently he noticed a suspicious lump on the left side of the neck and he was seen by his primary care physician and CT scan of the soft tissue of the neck without contrast was performed on November 21, 2022 and that showed no neck mass or lymphadenopathy identified however there was evidence of right upper lobe lung and mediastinal metastatic disease within the visible chest.  On November 22, 2022 the patient had CT scan of the chest, abdomen and pelvis with contrast and it showed 2 right upper lobe pulmonary nodules, one measuring 2.0 x 1.6 cm and a separate right upper lobe nodule with somewhat irregular margins measured 2.0 x 1.7 cm.  There was also pathologic right paratracheal node measuring 2.1 cm. The patient was referred to me today for evaluation and recommendation regarding treatment of his condition. When seen today he is feeling fine but very anxious about his scan  results.  He denied having any current chest pain, shortness of breath, cough or hemoptysis but he has some sinus issues.  He has no nausea, vomiting, diarrhea or constipation.  He has no headache or visual changes.  He has no recent weight loss or night sweats.  He is very active. Family history significant for mother who died from a stroke at age 42 and father died from multiple myeloma at age 3. The patient is a widow and he was planning a marriage in April 2024.  He has 3 sons.  He was accompanied by his friend Alan Mendez previous vice president of the [facility].  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  He has a history for smoking for around 20 years but quit 4 years ago.  He drinks alcohol 2-3 times a week and no history of drug abuse.  HPI  Past Medical History:  Diagnosis Date   Acquired solitary kidney    left --  s/p  right nephroureterctomy 01/ 2018   Anxiety    Depression    Elevated PSA    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, negative margins)   History of kidney stones    History of unilateral nephrectomy    01/ 2018  right nephroureterectomy for renal pelvis mass (carcinoma in situ)  HLD (hyperlipidemia)    Hyperplasia of prostate without lower urinary tract symptoms (LUTS)    Nephrolithiasis    bilateral non-obstructive   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HCC)    Renal cyst, acquired, right    Wears glasses     Past Surgical History:  Procedure Laterality Date   APPENDECTOMY  child   CATARACT EXTRACTION W/ INTRAOCULAR LENS  IMPLANT, BILATERAL  2017   COLONOSCOPY  08/15/2012   Tubular Adenoma, No high grade dysplasia or malignacy.   CYSTOSCOPY W/ RETROGRADES Right 09/25/2016    Procedure: CYSTOSCOPY, URETHRAL DILITATION  WITH RETROGRADE PYELOGRAM, BRUSH BIOPSIES OF RIGHT RENAL MASS;  Surgeon: Jethro Bolus, MD;  Location: [facility];  Service: Urology;  Laterality: Right;   CYSTOSCOPY W/ RETROGRADES Left 03/16/2017   Procedure: CYSTOSCOPY WITH RETROGRADE PYELOGRAM;  Surgeon: Crist Fat, MD;  Location: [facility];  Service: Urology;  Laterality: Left;   CYSTOSCOPY W/ URETERAL STENT REMOVAL Right 09/25/2016   Procedure: CYSTOSCOPY WITH STENT REMOVAL;  Surgeon: Jethro Bolus, MD;  Location: [facility] [place];  Service: Urology;  Laterality: Right;   CYSTOSCOPY WITH RETROGRADE PYELOGRAM, URETEROSCOPY AND STENT PLACEMENT Right 08/21/2016   Procedure: CYSTOSCOPY WITH RIGHT RETROGRADE PYELOGRAM, RIGHT FLEXIBLE AND RIGID URETEROSCOPY, INSERTION DOUBLE J STENT RIGHT;  Surgeon: Jethro Bolus, MD;  Location: [facility];  Service: Urology;  Laterality: Right;   EXTRACORPOREAL SHOCK WAVE LITHOTRIPSY  2009   KNEE SURGERY Right 1980's   ROBOT ASSITED LAPAROSCOPIC NEPHROURETERECTOMY Right 11/15/2016   Procedure: RIGHT XI ROBOT ASSITED LAPAROSCOPIC NEPHROURETERECTOMY;  Surgeon: Crist Fat, MD;  Location: [facility];  Service: Urology;  Laterality: Right;   TRANSURETHRAL RESECTION OF BLADDER TUMOR WITH MITOMYCIN-C Left 03/16/2017   Procedure: CYSTOSCOPY BIOPSIES OF BLADDER TUMOR WITH FULGURATION;  Surgeon: Crist Fat, MD;  Location: [facility];  Service: Urology;  Laterality: Left;   URETEROSCOPY Right 09/25/2016   Procedure: RIGHT URETEROSCOPY;  Surgeon: Jethro Bolus, MD;  Location: [facility];  Service: Urology;  Laterality: Right;    Family History  Problem Relation Age of Onset   Heart disease Mother    Cancer Father    Cancer Paternal Grandmother    Heart disease Paternal Grandfather     Social History Social History   Tobacco Use   Smoking status:  Former    Packs/day: 1.00    Years: 25.00    Total pack years: 25.00    Types: Cigarettes    Quit date: 11/13/1985    Years since quitting: 37.0   Smokeless tobacco: Never  Vaping Use   Vaping Use: Never used  Substance Use Topics   Alcohol use: Yes    Alcohol/week: 2.0 standard drinks of alcohol    Types: 2 Standard drinks or equivalent per week    Comment: occas   Drug use: No    Allergies  Allergen Reactions   Lamisil [Terbinafine]     Tongue swelling    Current Outpatient Medications  Medication Sig Dispense Refill   Multiple Vitamins-Minerals (MULTIVITAMIN WITH MINERALS) tablet Take 1 tablet by mouth daily.     rosuvastatin (CRESTOR) 10 MG tablet TAKE 1 TABLET EACH DAY. (Patient taking differently: Take 10 mg by mouth each morning) 30 tablet 0   tamsulosin (FLOMAX) 0.4 MG CAPS capsule Take 1 capsule (0.4 mg total) by mouth daily. (Patient taking differently: Take 0.4 mg by mouth daily after breakfast.) 30 capsule 5   aspirin EC 81 MG tablet Take 81  mg by mouth daily. (Patient not taking: Reported on 11/30/2022)     Naproxen Sodium (ALEVE) 220 MG CAPS Take 220-440 mg by mouth every 12 (twelve) hours as needed (pain).  (Patient not taking: Reported on 11/30/2022)     traMADol (ULTRAM) 50 MG tablet Take 1-2 tablets (50-100 mg total) by mouth every 6 (six) hours as needed for moderate pain. 30 tablet 0   No current facility-administered medications for this visit.    Review of Systems  Constitutional: negative Eyes: negative Ears, nose, mouth, throat, and face: negative Respiratory: negative Cardiovascular: negative Gastrointestinal: negative Genitourinary:negative Integument/breast: negative Hematologic/lymphatic: negative Musculoskeletal:negative Neurological: negative Behavioral/Psych: negative Endocrine: negative Allergic/Immunologic: negative  Physical Exam  YUU:[id number], healthy, no distress, well nourished, well developed, and anxious SKIN: skin color,  texture, turgor are normal, no rashes or significant lesions HEAD: Normocephalic, No masses, lesions, tenderness or abnormalities EYES: normal, PERRLA, Conjunctiva are pink and non-injected EARS: External ears normal, Canals clear OROPHARYNX:no exudate, no erythema, and lips, buccal mucosa, and tongue normal  NECK: supple, no adenopathy, no JVD LYMPH:  no palpable lymphadenopathy, no hepatosplenomegaly LUNGS: clear to auscultation , and palpation HEART: regular rate & rhythm, no murmurs, and no gallops ABDOMEN:abdomen soft, non-tender, normal bowel sounds, and no masses or organomegaly BACK: Back symmetric, no curvature., No CVA tenderness EXTREMITIES:no joint deformities, effusion, or inflammation, no edema  NEURO: alert & oriented x 3 with fluent speech, no focal motor/sensory deficits  PERFORMANCE STATUS: ECOG 1  LABORATORY DATA: Lab Results  Component Value Date   WBC 6.4 11/30/2022   HGB 14.8 11/30/2022   HCT 43.5 11/30/2022   MCV 85.1 11/30/2022   PLT 264 11/30/2022      Chemistry      Component Value Date/Time   NA 140 11/30/2022 0700   K 3.9 11/30/2022 0700   CL 107 11/30/2022 0700   CO2 26 11/30/2022 0700   BUN 20 11/30/2022 0700   CREATININE 1.07 11/30/2022 0700   CREATININE 0.85 12/10/2014 1550      Component Value Date/Time   CALCIUM 8.9 11/30/2022 0700   ALKPHOS 65 11/30/2022 0700   AST 22 11/30/2022 0700   ALT 17 11/30/2022 0700   BILITOT 0.7 11/30/2022 0700       RADIOGRAPHIC STUDIES: CT CHEST ABDOMEN PELVIS W CONTRAST  Result Date: 11/23/2022 CLINICAL DATA:  Urothelial malignancy with right nephroureterectomy in 2018. Prior bladder cancer. Recent neck CT revealed evidence of active malignancy in the chest. * Tracking Code: BO * EXAM: CT CHEST, ABDOMEN, AND PELVIS WITH CONTRAST TECHNIQUE: Multidetector CT imaging of the chest, abdomen and pelvis was performed following the standard protocol during bolus administration of intravenous contrast. RADIATION  DOSE REDUCTION: This exam was performed according to the departmental dose-optimization program which includes automated exposure control, adjustment of the mA and/or kV according to patient size and/or use of iterative reconstruction technique. CONTRAST:  81mL OMNIPAQUE IOHEXOL 300 MG/ML  SOLN COMPARISON:  Multiple exams, including 03/23/2020 and recent neck CT from 11/21/2022 FINDINGS: CT CHEST FINDINGS Cardiovascular: Mild thoracic aortic atherosclerotic vascular disease. Mediastinum/Nodes: Pathologic right paratracheal node 2.1 cm in short axis on image 18 series 2. Lungs/Pleura: Right upper lobe nodule, 2.0 by 1.6 cm on image 35 series 4. A separate right upper lobe nodule with somewhat irregular margins measures 2.0 by 1.7 cm on image 52 of series 4, and may have subtle internal air bronchograms or cavitation. Musculoskeletal: Dextroconvex upper thoracic and levoconvex lower thoracic scoliosis with rotary component. No compelling findings of osseous  metastatic disease. CT ABDOMEN PELVIS FINDINGS Hepatobiliary: A sharply defined fluid density lesion in segment 4a of the liver measures 1.1 by 1.0 cm on image 49 of series 2 and is likely a cyst. This measured 0.8 cm in diameter on 03/23/2020. 1.3 cm gallstone noted in the gallbladder. Pancreas: Unremarkable Spleen: Unremarkable Adrenals/Urinary Tract: Both adrenal glands appear normal. Right nephroureterectomy. The prostate gland indents the bladder base but no bladder mass is currently identified. Fluid density left kidney upper pole cyst appears benign and similar to the 2021 exam. No abnormal enhancement is identified along the remaining urothelium. Stomach/Bowel: Sigmoid colon diverticulosis. No findings of active diverticulitis. Vascular/Lymphatic: Atherosclerosis is present, including aortoiliac atherosclerotic disease. No pathologic adenopathy in the abdomen or pelvis. Reproductive: Notable prostatomegaly. Other: No supplemental non-categorized findings.  Musculoskeletal: Chronic lucent lesion of the left iliac bone measuring 1.8 by 1.0 cm, with thin sclerotic margin and no substantial change from 2021, benign. Levoconvex thoracolumbar scoliosis. IMPRESSION: 1. Two right upper lobe pulmonary nodules, the larger of which measures 2.0 by 1.6 cm and the smaller 2.0 by 1.7 cm. There is also a pathologically enlarged right paratracheal lymph node measuring 2.1 cm in short axis. The appearance favors malignancy over atypical infection with reactive adenopathy. Tissue diagnosis is likely indicated. 2. No findings of metastatic disease to the abdomen/pelvis or osseous structures. 3. Aortic atherosclerosis. 4. Sigmoid colon diverticulosis. 5. Prior right nephroureterectomy. 6. Prostatomegaly. 7. Cholelithiasis. Aortic Atherosclerosis (ICD10-I70.0). Electronically Signed   By: Van Clines M.D.   On: 11/23/2022 16:12   CT SOFT TISSUE NECK WO CONTRAST  Addendum Date: 11/21/2022   ADDENDUM REPORT: 11/21/2022 09:54 ADDENDUM: Study discussed by telephone with Dr. [name]. [name] (covering for provider STEPHANIE EDWARDS) on 11/21/2022 at 0945 hours. Electronically Signed   By: Genevie Ann M.D.   On: 11/21/2022 09:54   Result Date: 11/21/2022 CLINICAL DATA:  82 year old male with left neck mass discovered 3 days ago. Remote history of renal or urothelial carcinoma status post right nephrectomy in 2018. EXAM: CT NECK WITHOUT CONTRAST TECHNIQUE: Multidetector CT imaging of the neck was performed following the standard protocol without intravenous contrast. RADIATION DOSE REDUCTION: This exam was performed according to the departmental dose-optimization program which includes automated exposure control, adjustment of the mA and/or kV according to patient size and/or use of iterative reconstruction technique. COMPARISON:  Cervical spine CT 03/15/2019. FINDINGS: Pharynx and larynx: Noncontrast larynx, pharynx, parapharyngeal, and retropharyngeal spaces appear stable and within  normal limits. Salivary glands: Noncontrast parotid and submandibular glands appear symmetric and within normal limits. Negative noncontrast sublingual space. Thyroid: Round 15 mm hypodense left thyroid nodule has not significantly changed since 2020 and significance is doubtful. In the setting of significant comorbidities or limited life expectancy, no follow-up recommended (ref: J Am Coll Radiol. 2015 Feb;12(2): 143-50). Lymph nodes: No marked area of clinical concern. No cervical lymphadenopathy identified on this noncontrast exam. Visible bilateral cervical lymph nodes appear stable from the 2020 comparison and are subcentimeter, normal. A right suboccipital oval subcutaneous intermediate density soft tissue lesion measuring roughly 8 x 20 mm (series 5, image 28) was present in 2020 and not significantly changed, probably chronic benign etiology such as scarring or hematoma. No other neck mass or soft tissue asymmetry identified in the absence of IV contrast. Vascular: Vascular patency is not evaluated in the absence of IV contrast. No significant calcified atherosclerosis in the neck. Limited intracranial: Grossly stable since 2020 and negative for age. Visualized orbits: Postoperative changes to both globes, otherwise  negative. Mastoids and visualized paranasal sinuses: Clear throughout. Skeleton: Bilateral TMJ degeneration. No acute dental finding. Advanced chronic cervical spine degeneration, but not significantly changed from 2020. Chronic interbody ankylosis C5-C6. Mild T1 superior endplate deformity is new since 2020 but more resembles a chronic Schmorl's node. Partially visible upper thoracic scoliosis. No destructive osseous lesion is identified. Upper chest: Round and mildly spiculated right upper lobe lung nodules/masses are 21 and 18 mm diameter respectively on series 4, images 102 and 117. Those levels not definitely imaged on the 2020 cervical spine CT. However, there is also partially visible  right paratracheal mediastinal lymphadenopathy with a 2 cm short axis node on series 5 image 111. Visible axillary lymph nodes remain normal. Mild Calcified aortic atherosclerosis. IMPRESSION: No neck mass or lymphadenopathy identified in the noncontrast Neck. However, evidence of right upper lung and mediastinal Metastatic Disease in the visible Chest. Recommend restaging CT Chest, Abdomen, and Pelvis (oral and IV contrast preferred). Electronically Signed: By: Odessa Fleming M.D. On: 11/21/2022 09:39    ASSESSMENT: This is a very pleasant 82 years old white male with history of urothelial carcinoma, papillary type status post right nephroureterectomy as well as BCG treatment for the superficial bladder urothelial carcinoma in 2018.  The patient has been on observation since that time but he was found on recent CT scan of the chest to have 2 right upper lobe pulmonary nodule in addition to pathologic right paratracheal lymph node.  These are suspicious to be primary lung cancer but metastatic disease from the urothelial carcinoma could not be completely excluded at this point.   PLAN: I had a lengthy discussion with the patient and his friend today about his current condition and further investigation to confirm his diagnosis as well as the staging workup. I personally and independently reviewed the scan images and discussed the result and showed the images to the patient today. I recommended for him to complete the staging workup by ordering a PET scan as well as MRI of the brain to rule out any other metastatic disease. I also referred the patient to pulmonary medicine for consideration of navigational bronchoscopy and EBUS for tissue diagnosis. I will arrange for the patient to come back for follow-up visit in around 2 weeks for evaluation and management of his condition based on the final staging workup and biopsy results. The patient was advised to call immediately if he has any other concerning symptoms in  the interval. The patient voices understanding of current disease status and treatment options and is in agreement with the current care plan.  All questions were answered. The patient knows to call the clinic with any problems, questions or concerns. We can certainly see the patient much sooner if necessary.  Thank you so much for allowing me to participate in the care of Alan Mendez. I will continue to follow up the patient with you and assist in his care.  The total time spent in the appointment was 60 minutes.  Disclaimer: This note was dictated with voice recognition software. Similar sounding words can inadvertently be transcribed and may not be corrected upon review.   Lajuana Matte November 30, 2022, 2:36 PM

## 2022-12-06 ENCOUNTER — Encounter (HOSPITAL_COMMUNITY): Payer: Self-pay | Admitting: Pulmonary Disease

## 2022-12-06 ENCOUNTER — Encounter: Payer: Self-pay | Admitting: Pulmonary Disease

## 2022-12-06 ENCOUNTER — Ambulatory Visit (INDEPENDENT_AMBULATORY_CARE_PROVIDER_SITE_OTHER): Payer: Medicare Other | Admitting: Pulmonary Disease

## 2022-12-06 VITALS — BP 150/82 | HR 72 | Ht 64.0 in | Wt 146.0 lb

## 2022-12-06 DIAGNOSIS — Z85528 Personal history of other malignant neoplasm of kidney: Secondary | ICD-10-CM | POA: Diagnosis not present

## 2022-12-06 DIAGNOSIS — R918 Other nonspecific abnormal finding of lung field: Secondary | ICD-10-CM | POA: Diagnosis not present

## 2022-12-06 DIAGNOSIS — Z8551 Personal history of malignant neoplasm of bladder: Secondary | ICD-10-CM | POA: Diagnosis not present

## 2022-12-06 DIAGNOSIS — R599 Enlarged lymph nodes, unspecified: Secondary | ICD-10-CM | POA: Diagnosis not present

## 2022-12-06 DIAGNOSIS — Z87891 Personal history of nicotine dependence: Secondary | ICD-10-CM | POA: Diagnosis not present

## 2022-12-06 NOTE — H&P (View-Only) (Signed)
Synopsis: Referred in January 2024 for pulmonary nodule by Curt Bears, MD  Subjective:   PATIENT ID: Alan Mendez GENDER: male DOB: 1941-05-26, MRN: [id number]  Chief Complaint  Patient presents with   Consult    Lung nodule    This is a 82 year old gentleman, past medical history of colon polyps, history of kidney stones, hyperlipidemia, recurrent bladder papillary carcinoma.  He was referred after having CT imaging of the chest which revealed pulmonary nodules as well as adenopathy concerning for malignancy.  Patient is a former smoker, 25 years quit in 1987.  Smoked for a total of 30+ years.CT imaging was complete by his primary care provider and he was referred to medical oncology.  Medical oncology referred him here for tissue biopsy.  Patient has 2 right upper lobe pulmonary nodules.  He also has an enlarged right paratracheal lymph node.  From respiratory standpoint he has no complaints today.  Asymptomatic.      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, negative margins)   History of kidney stones    History of unilateral nephrectomy    01/ 2018  right nephroureterectomy for renal pelvis mass (carcinoma in situ)   HLD (hyperlipidemia)    Hyperplasia of prostate without lower urinary tract symptoms (LUTS)    Nephrolithiasis    bilateral non-obstructive   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HCC)    Renal cyst, acquired, right    Wears glasses      Family History  Problem Relation Age of Onset   Heart disease Mother    Cancer Father    Cancer Paternal Grandmother    Heart disease  Paternal Grandfather      Past Surgical History:  Procedure Laterality Date   APPENDECTOMY  child   CATARACT EXTRACTION W/ INTRAOCULAR LENS  IMPLANT, BILATERAL  2017   COLONOSCOPY  08/15/2012   Tubular Adenoma, No high grade dysplasia or malignacy.   CYSTOSCOPY W/ RETROGRADES Right 09/25/2016   Procedure: CYSTOSCOPY, URETHRAL DILITATION  WITH RETROGRADE PYELOGRAM, BRUSH BIOPSIES OF RIGHT RENAL MASS;  Surgeon: Carolan Clines, MD;  Location: [place];  Service: Urology;  Laterality: Right;   CYSTOSCOPY W/ RETROGRADES Left 03/16/2017   Procedure: CYSTOSCOPY WITH RETROGRADE PYELOGRAM;  Surgeon: Ardis Hughs, MD;  Location: [facility];  Service: Urology;  Laterality: Left;   CYSTOSCOPY W/ URETERAL STENT REMOVAL Right 09/25/2016   Procedure: CYSTOSCOPY WITH STENT REMOVAL;  Surgeon: Carolan Clines, MD;  Location: [place];  Service: Urology;  Laterality: Right;   CYSTOSCOPY WITH RETROGRADE PYELOGRAM, URETEROSCOPY AND STENT PLACEMENT Right 08/21/2016   Procedure: CYSTOSCOPY WITH RIGHT RETROGRADE PYELOGRAM, RIGHT FLEXIBLE AND RIGID URETEROSCOPY, INSERTION DOUBLE J STENT RIGHT;  Surgeon: Carolan Clines, MD;  Location: [place];  Service: Urology;  Laterality: Right;   EXTRACORPOREAL SHOCK WAVE LITHOTRIPSY  2009   KNEE SURGERY Right 1980's   ROBOT ASSITED LAPAROSCOPIC NEPHROURETERECTOMY Right 11/15/2016   Procedure: RIGHT XI ROBOT ASSITED LAPAROSCOPIC NEPHROURETERECTOMY;  Surgeon: Ardis Hughs, MD;  Location: [facility];  Service: Urology;  Laterality: Right;   TRANSURETHRAL RESECTION OF BLADDER TUMOR WITH MITOMYCIN-C Left 03/16/2017   Procedure: CYSTOSCOPY BIOPSIES OF BLADDER TUMOR WITH FULGURATION;  Surgeon: Ardis Hughs, MD;  Location: [facility];  Service: Urology;  Laterality: Left;   URETEROSCOPY Right 09/25/2016   Procedure: RIGHT URETEROSCOPY;  Surgeon: Carolan Clines, MD;  Location:  [facility];  Service: Urology;  Laterality: Right;    Social History   Socioeconomic History   Marital status: Widowed    Spouse name: Not on file   Number of children: Not on file   Years of education: Not on file   Highest education level: Not on file  Occupational History   Occupation: Scientist, physiological - [employer]  Tobacco Use   Smoking status: Former    Packs/day: 1.00    Years: 25.00    Total pack years: 25.00    Types: Cigarettes    Quit date: 11/13/1985    Years since quitting: 37.0   Smokeless tobacco: Never  Vaping Use   Vaping Use: Never used  Substance and Sexual Activity   Alcohol use: Yes    Alcohol/week: 2.0 standard drinks of alcohol    Types: 2 Standard drinks or equivalent per week    Comment: occas   Drug use: No   Sexual activity: Not on file  Other Topics Concern   Not on file  Social History Narrative   Not on file   Social Determinants of Health   Financial Resource Strain: Not on file  Food Insecurity: Not on file  Transportation Needs: Not on file  Physical Activity: Not on file  Stress: Not on file  Social Connections: Not on file  Intimate Partner Violence: Not on file     Allergies  Allergen Reactions   Lamisil [Terbinafine]     Tongue swelling     Outpatient Medications Prior to Visit  Medication Sig Dispense Refill   Multiple Vitamins-Minerals (MULTIVITAMIN WITH MINERALS) tablet Take 1 tablet by mouth daily.     Naproxen Sodium (ALEVE) 220 MG CAPS Take 220-440 mg by mouth every 12 (twelve) hours as needed (pain).  (Patient not taking: Reported on 11/30/2022)     rosuvastatin (CRESTOR) 10 MG tablet TAKE 1 TABLET EACH DAY. (Patient taking differently: Take 10 mg by mouth each morning) 30 tablet 0   tamsulosin (FLOMAX) 0.4 MG CAPS capsule Take 1 capsule (0.4 mg total) by mouth daily. (Patient taking differently: Take 0.4 mg by mouth daily after breakfast.) 30 capsule 5   aspirin EC 81 MG tablet Take 81 mg by mouth daily.  (Patient not taking: Reported on 11/30/2022)     traMADol (ULTRAM) 50 MG tablet Take 1-2 tablets (50-100 mg total) by mouth every 6 (six) hours as needed for moderate pain. 30 tablet 0   No facility-administered medications prior to visit.    Review of Systems  Constitutional:  Negative for chills, fever, malaise/fatigue and weight loss.  HENT:  Negative for hearing loss, sore throat and tinnitus.   Eyes:  Negative for blurred vision and double vision.  Respiratory:  Negative for cough, hemoptysis, sputum production, shortness of breath, wheezing and stridor.   Cardiovascular:  Negative for chest pain, palpitations, orthopnea, leg swelling and PND.  Gastrointestinal:  Negative for abdominal pain, constipation, diarrhea, heartburn, nausea and vomiting.  Genitourinary:  Negative for dysuria, hematuria and urgency.  Musculoskeletal:  Negative for joint pain and myalgias.  Skin:  Negative for itching and rash.  Neurological:  Negative  for dizziness, tingling, weakness and headaches.  Endo/Heme/Allergies:  Negative for environmental allergies. Does not bruise/bleed easily.  Psychiatric/Behavioral:  Negative for depression. The patient is not nervous/anxious and does not have insomnia.   All other systems reviewed and are negative.    Objective:  Physical Exam Vitals reviewed.  Constitutional:      General: He is not in acute distress.    Appearance: He is well-developed.  HENT:     Head: Normocephalic and atraumatic.  Eyes:     General: No scleral icterus.    Conjunctiva/sclera: Conjunctivae normal.     Pupils: Pupils are equal, round, and reactive to light.  Neck:     Vascular: No JVD.     Trachea: No tracheal deviation.  Cardiovascular:     Rate and Rhythm: Normal rate and regular rhythm.     Heart sounds: Normal heart sounds. No murmur heard. Pulmonary:     Effort: Pulmonary effort is normal. No tachypnea, accessory muscle usage or respiratory distress.     Breath sounds: No  stridor. No wheezing, rhonchi or rales.  Abdominal:     General: There is no distension.     Palpations: Abdomen is soft.     Tenderness: There is no abdominal tenderness.  Musculoskeletal:        General: No tenderness.     Cervical back: Neck supple.  Lymphadenopathy:     Cervical: No cervical adenopathy.  Skin:    General: Skin is warm and dry.     Capillary Refill: Capillary refill takes less than 2 seconds.     Findings: No rash.  Neurological:     Mental Status: He is alert and oriented to person, place, and time.  Psychiatric:        Behavior: Behavior normal.      Vitals:   12/06/22 1342  BP: (!) 150/82  Pulse: 72  SpO2: 100%  Weight: 146 lb (66.2 kg)  Height: 5\' 4"  (1.626 m)   100% on RA BMI Readings from Last 3 Encounters:  12/06/22 25.06 kg/m  11/30/22 24.07 kg/m  01/24/19 23.80 kg/m   Wt Readings from Last 3 Encounters:  12/06/22 146 lb (66.2 kg)  11/30/22 146 lb 14.4 oz (66.6 kg)  01/24/19 143 lb (64.9 kg)     CBC    Component Value Date/Time   WBC 6.4 11/30/2022 0700   RBC 5.11 11/30/2022 0700   HGB 14.8 11/30/2022 0700   HCT 43.5 11/30/2022 0700   PLT 264 11/30/2022 0700   MCV 85.1 11/30/2022 0700   MCH 29.0 11/30/2022 0700   MCHC 34.0 11/30/2022 0700   RDW 12.4 11/30/2022 0700   LYMPHSABS 1.5 11/30/2022 0700   MONOABS 0.6 11/30/2022 0700   EOSABS 0.1 11/30/2022 0700   BASOSABS 0.0 11/30/2022 0700    Chest Imaging: CT chest 11/22/2022: 2 right upper lobe pulmonary nodules, 2 x 1.6 cm and a smaller 2 x 1.7 cm lesion.  Also appears to have adenopathy within the peritracheal space. The patient's images have been independently reviewed by me.    Pulmonary Functions Testing Results:     No data to display          FeNO:   Pathology:   Echocardiogram:   Heart Catheterization:     Assessment & Plan:     ICD-10-CM   1. Lung nodules  R91.8 Procedural/ Surgical Case Request: ROBOTIC ASSISTED NAVIGATIONAL BRONCHOSCOPY, VIDEO  BRONCHOSCOPY WITH ENDOBRONCHIAL ULTRASOUND    Ambulatory referral to Pulmonology  2. Adenopathy  R59.9 Procedural/ Surgical Case Request: ROBOTIC ASSISTED NAVIGATIONAL BRONCHOSCOPY, VIDEO BRONCHOSCOPY WITH ENDOBRONCHIAL ULTRASOUND    Ambulatory referral to Pulmonology    3. History of kidney cancer  Z85.528     4. History of bladder cancer  Z85.51     5. Former smoker  Z87.891       Discussion:  This is a 82 year old gentleman, past medical history of tobacco use found to have lung nodules within the chest and adenopathy concerning for primary bronchogenic carcinoma.  He also has a history of bladder cancer.  Plan: Today in the office we talked at the risk-benefit alternatives of proceeding robotics is navigational bronchoscopy as well as videobronchoscopy endobronchial ultrasound transbronchial needle aspirations of the adenopathy within the chest to confirm malignancy. We talked with the risk of bleeding and pneumothorax. Patient is agreeable to proceed. Tentative bronchoscopy date 12/07/2022 We appreciate PCC's help with scheduling. Also glad that we can get another patient worked in tomorrow at endoscopy.  We completed today's office visit with phone conferencing his son to discuss and answer any questions.  We will get him set up with follow-up to see medical oncology.  He already has a brain MRI and nuclear medicine PET scan scheduled.   Current Outpatient Medications:    Multiple Vitamins-Minerals (MULTIVITAMIN WITH MINERALS) tablet, Take 1 tablet by mouth daily., Disp: , Rfl:    Naproxen Sodium (ALEVE) 220 MG CAPS, Take 220-440 mg by mouth every 12 (twelve) hours as needed (pain).  (Patient not taking: Reported on 11/30/2022), Disp: , Rfl:    rosuvastatin (CRESTOR) 10 MG tablet, TAKE 1 TABLET EACH DAY. (Patient taking differently: Take 10 mg by mouth each morning), Disp: 30 tablet, Rfl: 0   tamsulosin (FLOMAX) 0.4 MG CAPS capsule, Take 1 capsule (0.4 mg total) by mouth  daily. (Patient taking differently: Take 0.4 mg by mouth daily after breakfast.), Disp: 30 capsule, Rfl: 5  I spent 62 minutes dedicated to the care of this patient on the date of this encounter to include pre-visit review of records, face-to-face time with the patient discussing conditions above, post visit ordering of testing, clinical documentation with the electronic health record, making appropriate referrals as documented, and communicating necessary findings to members of the patients care team.   Garner Nash, [facility] 12/06/2022 2:04 PM

## 2022-12-06 NOTE — Patient Instructions (Signed)
Thank you for visiting Dr. Valeta Harms at [facility]. Today we recommend the following:  Orders Placed This Encounter  Procedures   Procedural/ Surgical Case Request: ROBOTIC ASSISTED NAVIGATIONAL BRONCHOSCOPY, VIDEO BRONCHOSCOPY WITH ENDOBRONCHIAL ULTRASOUND   Ambulatory referral to Pulmonology   Bronchoscopy on 12/07/2022  Return if symptoms worsen or fail to improve, for follow up with Dr. Julien Nordmann .    Please do your part to reduce the spread of COVID-19.

## 2022-12-06 NOTE — Progress Notes (Signed)
Synopsis: Referred in January 2024 for pulmonary nodule by Curt Bears, MD  Subjective:   PATIENT ID: Alan Mendez GENDER: male DOB: 1940/12/18, MRN: [id number]  Chief Complaint  Patient presents with   Consult    Lung nodule    This is a 82 year old gentleman, past medical history of colon polyps, history of kidney stones, hyperlipidemia, recurrent bladder papillary carcinoma.  He was referred after having CT imaging of the chest which revealed pulmonary nodules as well as adenopathy concerning for malignancy.  Patient is a former smoker, 25 years quit in 1987.  Smoked for a total of 30+ years.CT imaging was complete by his primary care provider and he was referred to medical oncology.  Medical oncology referred him here for tissue biopsy.  Patient has 2 right upper lobe pulmonary nodules.  He also has an enlarged right paratracheal lymph node.  From respiratory standpoint he has no complaints today.  Asymptomatic.      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, negative margins)   History of kidney stones    History of unilateral nephrectomy    01/ 2018  right nephroureterectomy for renal pelvis mass (carcinoma in situ)   HLD (hyperlipidemia)    Hyperplasia of prostate without lower urinary tract symptoms (LUTS)    Nephrolithiasis    bilateral non-obstructive   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HCC)    Renal cyst, acquired, right    Wears glasses      Family History  Problem Relation Age of Onset   Heart disease Mother    Cancer Father    Cancer Paternal Grandmother    Heart disease  Paternal Grandfather      Past Surgical History:  Procedure Laterality Date   APPENDECTOMY  child   CATARACT EXTRACTION W/ INTRAOCULAR LENS  IMPLANT, BILATERAL  2017   COLONOSCOPY  08/15/2012   Tubular Adenoma, No high grade dysplasia or malignacy.   CYSTOSCOPY W/ RETROGRADES Right 09/25/2016   Procedure: CYSTOSCOPY, URETHRAL DILITATION  WITH RETROGRADE PYELOGRAM, BRUSH BIOPSIES OF RIGHT RENAL MASS;  Surgeon: Carolan Clines, MD;  Location: [place];  Service: Urology;  Laterality: Right;   CYSTOSCOPY W/ RETROGRADES Left 03/16/2017   Procedure: CYSTOSCOPY WITH RETROGRADE PYELOGRAM;  Surgeon: Ardis Hughs, MD;  Location[facility];  Service: Urology;  Laterality: Left;   CYSTOSCOPY W/ URETERAL STENT REMOVAL Right 09/25/2016   Procedure: CYSTOSCOPY WITH STENT REMOVAL;  Surgeon: Carolan Clines, MD;  Location: [place];  Service: Urology;  Laterality: Right;   CYSTOSCOPY WITH RETROGRADE PYELOGRAM, URETEROSCOPY AND STENT PLACEMENT Right 08/21/2016   Procedure: CYSTOSCOPY WITH RIGHT RETROGRADE PYELOGRAM, RIGHT FLEXIBLE AND RIGID URETEROSCOPY, INSERTION DOUBLE J STENT RIGHT;  Surgeon: Carolan Clines, MD;  Location: [place];  Service: Urology;  Laterality: Right;   EXTRACORPOREAL SHOCK WAVE LITHOTRIPSY  2009   KNEE SURGERY Right 1980's   ROBOT ASSITED LAPAROSCOPIC NEPHROURETERECTOMY Right 11/15/2016   Procedure: RIGHT XI ROBOT ASSITED LAPAROSCOPIC NEPHROURETERECTOMY;  Surgeon: Ardis Hughs, MD;  Location: [facility];  Service: Urology;  Laterality: Right;   TRANSURETHRAL RESECTION OF BLADDER TUMOR WITH MITOMYCIN-C Left 03/16/2017   Procedure: CYSTOSCOPY BIOPSIES OF BLADDER TUMOR WITH FULGURATION;  Surgeon: Ardis Hughs, MD;  Location: [facility];  Service: Urology;  Laterality: Left;   URETEROSCOPY Right 09/25/2016   Procedure: RIGHT URETEROSCOPY;  Surgeon: Carolan Clines, MD;  Location:  [facility];  Service: Urology;  Laterality: Right;    Social History   Socioeconomic History   Marital status: Widowed    Spouse name: Not on file   Number of children: Not on file   Years of education: Not on file   Highest education level: Not on file  Occupational History   Occupation: Scientist, physiological - [employer]  Tobacco Use   Smoking status: Former    Packs/day: 1.00    Years: 25.00    Total pack years: 25.00    Types: Cigarettes    Quit date: 11/13/1985    Years since quitting: 37.0   Smokeless tobacco: Never  Vaping Use   Vaping Use: Never used  Substance and Sexual Activity   Alcohol use: Yes    Alcohol/week: 2.0 standard drinks of alcohol    Types: 2 Standard drinks or equivalent per week    Comment: occas   Drug use: No   Sexual activity: Not on file  Other Topics Concern   Not on file  Social History Narrative   Not on file   Social Determinants of Health   Financial Resource Strain: Not on file  Food Insecurity: Not on file  Transportation Needs: Not on file  Physical Activity: Not on file  Stress: Not on file  Social Connections: Not on file  Intimate Partner Violence: Not on file     Allergies  Allergen Reactions   Lamisil [Terbinafine]     Tongue swelling     Outpatient Medications Prior to Visit  Medication Sig Dispense Refill   Multiple Vitamins-Minerals (MULTIVITAMIN WITH MINERALS) tablet Take 1 tablet by mouth daily.     Naproxen Sodium (ALEVE) 220 MG CAPS Take 220-440 mg by mouth every 12 (twelve) hours as needed (pain).  (Patient not taking: Reported on 11/30/2022)     rosuvastatin (CRESTOR) 10 MG tablet TAKE 1 TABLET EACH DAY. (Patient taking differently: Take 10 mg by mouth each morning) 30 tablet 0   tamsulosin (FLOMAX) 0.4 MG CAPS capsule Take 1 capsule (0.4 mg total) by mouth daily. (Patient taking differently: Take 0.4 mg by mouth daily after breakfast.) 30 capsule 5   aspirin EC 81 MG tablet Take 81 mg by mouth daily.  (Patient not taking: Reported on 11/30/2022)     traMADol (ULTRAM) 50 MG tablet Take 1-2 tablets (50-100 mg total) by mouth every 6 (six) hours as needed for moderate pain. 30 tablet 0   No facility-administered medications prior to visit.    Review of Systems  Constitutional:  Negative for chills, fever, malaise/fatigue and weight loss.  HENT:  Negative for hearing loss, sore throat and tinnitus.   Eyes:  Negative for blurred vision and double vision.  Respiratory:  Negative for cough, hemoptysis, sputum production, shortness of breath, wheezing and stridor.   Cardiovascular:  Negative for chest pain, palpitations, orthopnea, leg swelling and PND.  Gastrointestinal:  Negative for abdominal pain, constipation, diarrhea, heartburn, nausea and vomiting.  Genitourinary:  Negative for dysuria, hematuria and urgency.  Musculoskeletal:  Negative for joint pain and myalgias.  Skin:  Negative for itching and rash.  Neurological:  Negative  for dizziness, tingling, weakness and headaches.  Endo/Heme/Allergies:  Negative for environmental allergies. Does not bruise/bleed easily.  Psychiatric/Behavioral:  Negative for depression. The patient is not nervous/anxious and does not have insomnia.   All other systems reviewed and are negative.    Objective:  Physical Exam Vitals reviewed.  Constitutional:      General: He is not in acute distress.    Appearance: He is well-developed.  HENT:     Head: Normocephalic and atraumatic.  Eyes:     General: No scleral icterus.    Conjunctiva/sclera: Conjunctivae normal.     Pupils: Pupils are equal, round, and reactive to light.  Neck:     Vascular: No JVD.     Trachea: No tracheal deviation.  Cardiovascular:     Rate and Rhythm: Normal rate and regular rhythm.     Heart sounds: Normal heart sounds. No murmur heard. Pulmonary:     Effort: Pulmonary effort is normal. No tachypnea, accessory muscle usage or respiratory distress.     Breath sounds: No  stridor. No wheezing, rhonchi or rales.  Abdominal:     General: There is no distension.     Palpations: Abdomen is soft.     Tenderness: There is no abdominal tenderness.  Musculoskeletal:        General: No tenderness.     Cervical back: Neck supple.  Lymphadenopathy:     Cervical: No cervical adenopathy.  Skin:    General: Skin is warm and dry.     Capillary Refill: Capillary refill takes less than 2 seconds.     Findings: No rash.  Neurological:     Mental Status: He is alert and oriented to person, place, and time.  Psychiatric:        Behavior: Behavior normal.      Vitals:   12/06/22 1342  BP: (!) 150/82  Pulse: 72  SpO2: 100%  Weight: 146 lb (66.2 kg)  Height: 5\' 4"  (1.626 m)   100% on RA BMI Readings from Last 3 Encounters:  12/06/22 25.06 kg/m  11/30/22 24.07 kg/m  01/24/19 23.80 kg/m   Wt Readings from Last 3 Encounters:  12/06/22 146 lb (66.2 kg)  11/30/22 146 lb 14.4 oz (66.6 kg)  01/24/19 143 lb (64.9 kg)     CBC    Component Value Date/Time   WBC 6.4 11/30/2022 0700   RBC 5.11 11/30/2022 0700   HGB 14.8 11/30/2022 0700   HCT 43.5 11/30/2022 0700   PLT 264 11/30/2022 0700   MCV 85.1 11/30/2022 0700   MCH 29.0 11/30/2022 0700   MCHC 34.0 11/30/2022 0700   RDW 12.4 11/30/2022 0700   LYMPHSABS 1.5 11/30/2022 0700   MONOABS 0.6 11/30/2022 0700   EOSABS 0.1 11/30/2022 0700   BASOSABS 0.0 11/30/2022 0700    Chest Imaging: CT chest 11/22/2022: 2 right upper lobe pulmonary nodules, 2 x 1.6 cm and a smaller 2 x 1.7 cm lesion.  Also appears to have adenopathy within the peritracheal space. The patient's images have been independently reviewed by me.    Pulmonary Functions Testing Results:     No data to display          FeNO:   Pathology:   Echocardiogram:   Heart Catheterization:     Assessment & Plan:     ICD-10-CM   1. Lung nodules  R91.8 Procedural/ Surgical Case Request: ROBOTIC ASSISTED NAVIGATIONAL BRONCHOSCOPY, VIDEO  BRONCHOSCOPY WITH ENDOBRONCHIAL ULTRASOUND    Ambulatory referral to Pulmonology  2. Adenopathy  R59.9 Procedural/ Surgical Case Request: ROBOTIC ASSISTED NAVIGATIONAL BRONCHOSCOPY, VIDEO BRONCHOSCOPY WITH ENDOBRONCHIAL ULTRASOUND    Ambulatory referral to Pulmonology    3. History of kidney cancer  Z85.528     4. History of bladder cancer  Z85.51     5. Former smoker  Z87.891       Discussion:  This is a 82 year old gentleman, past medical history of tobacco use found to have lung nodules within the chest and adenopathy concerning for primary bronchogenic carcinoma.  He also has a history of bladder cancer.  Plan: Today in the office we talked at the risk-benefit alternatives of proceeding robotics is navigational bronchoscopy as well as videobronchoscopy endobronchial ultrasound transbronchial needle aspirations of the adenopathy within the chest to confirm malignancy. We talked with the risk of bleeding and pneumothorax. Patient is agreeable to proceed. Tentative bronchoscopy date 12/07/2022 We appreciate PCC's help with scheduling. Also glad that we can get another patient worked in tomorrow at endoscopy.  We completed today's office visit with phone conferencing his son to discuss and answer any questions.  We will get him set up with follow-up to see medical oncology.  He already has a brain MRI and nuclear medicine PET scan scheduled.   Current Outpatient Medications:    Multiple Vitamins-Minerals (MULTIVITAMIN WITH MINERALS) tablet, Take 1 tablet by mouth daily., Disp: , Rfl:    Naproxen Sodium (ALEVE) 220 MG CAPS, Take 220-440 mg by mouth every 12 (twelve) hours as needed (pain).  (Patient not taking: Reported on 11/30/2022), Disp: , Rfl:    rosuvastatin (CRESTOR) 10 MG tablet, TAKE 1 TABLET EACH DAY. (Patient taking differently: Take 10 mg by mouth each morning), Disp: 30 tablet, Rfl: 0   tamsulosin (FLOMAX) 0.4 MG CAPS capsule, Take 1 capsule (0.4 mg total) by mouth  daily. (Patient taking differently: Take 0.4 mg by mouth daily after breakfast.), Disp: 30 capsule, Rfl: 5  I spent 62 minutes dedicated to the care of this patient on the date of this encounter to include pre-visit review of records, face-to-face time with the patient discussing conditions above, post visit ordering of testing, clinical documentation with the electronic health record, making appropriate referrals as documented, and communicating necessary findings to members of the patients care team.   Garner Nash, [facility] 12/06/2022 2:04 PM

## 2022-12-06 NOTE — Progress Notes (Signed)
PCP - Dr Domenick Gong Cardiologist - none Hematology/Oncology - Dr Curt Bears  CT Chest x-ray - 11/22/22 EKG - none Stress Test - n/a ECHO - n/a Cardiac Cath - n/a  ICD Pacemaker/Loop - n/a  Sleep Study -  n/a CPAP - none  Diabetes - n/a  STOP now taking any Aspirin (unless otherwise instructed by your surgeon), Aleve, Naproxen, Ibuprofen, Motrin, Advil, Goody's, BC's, all herbal medications, fish oil, and all vitamins.   Coronavirus Screening Do you have any of the following symptoms:  Cough yes/no: No Fever (>100.49F)  yes/no: No Runny nose Yes Sore throat yes/no: No Difficulty breathing/shortness of breath  yes/no: No  Have you traveled in the last 14 days and where? yes/no: No  Patient verbalized understanding of instructions that were given via phone.

## 2022-12-07 ENCOUNTER — Ambulatory Visit (HOSPITAL_COMMUNITY): Payer: Medicare Other

## 2022-12-07 ENCOUNTER — Ambulatory Visit (HOSPITAL_COMMUNITY)
Admission: RE | Admit: 2022-12-07 | Discharge: 2022-12-07 | Disposition: A | Discharge status: Home / Self Care | Payer: Medicare Other | Attending: Pulmonary Disease | Admitting: Pulmonary Disease

## 2022-12-07 ENCOUNTER — Other Ambulatory Visit: Payer: Self-pay

## 2022-12-07 ENCOUNTER — Ambulatory Visit (HOSPITAL_BASED_OUTPATIENT_CLINIC_OR_DEPARTMENT_OTHER): Payer: Medicare Other | Admitting: Anesthesiology

## 2022-12-07 ENCOUNTER — Encounter (HOSPITAL_COMMUNITY)
Admission: RE | Disposition: A | Discharge status: Home / Self Care | Payer: Self-pay | Source: Home / Self Care | Attending: Pulmonary Disease

## 2022-12-07 ENCOUNTER — Ambulatory Visit (HOSPITAL_COMMUNITY): Payer: Medicare Other | Admitting: Anesthesiology

## 2022-12-07 ENCOUNTER — Encounter (HOSPITAL_COMMUNITY): Payer: Self-pay | Admitting: Pulmonary Disease

## 2022-12-07 DIAGNOSIS — Z87891 Personal history of nicotine dependence: Secondary | ICD-10-CM | POA: Insufficient documentation

## 2022-12-07 DIAGNOSIS — J9811 Atelectasis: Secondary | ICD-10-CM | POA: Diagnosis not present

## 2022-12-07 DIAGNOSIS — Z86010 Personal history of colonic polyps: Secondary | ICD-10-CM | POA: Insufficient documentation

## 2022-12-07 DIAGNOSIS — C3490 Malignant neoplasm of unspecified part of unspecified bronchus or lung: Secondary | ICD-10-CM

## 2022-12-07 DIAGNOSIS — R911 Solitary pulmonary nodule: Secondary | ICD-10-CM | POA: Insufficient documentation

## 2022-12-07 DIAGNOSIS — R918 Other nonspecific abnormal finding of lung field: Secondary | ICD-10-CM

## 2022-12-07 DIAGNOSIS — Z09 Encounter for follow-up examination after completed treatment for conditions other than malignant neoplasm: Secondary | ICD-10-CM | POA: Diagnosis not present

## 2022-12-07 DIAGNOSIS — Z87442 Personal history of urinary calculi: Secondary | ICD-10-CM | POA: Diagnosis not present

## 2022-12-07 DIAGNOSIS — Z20822 Contact with and (suspected) exposure to covid-19: Secondary | ICD-10-CM | POA: Insufficient documentation

## 2022-12-07 DIAGNOSIS — Z85528 Personal history of other malignant neoplasm of kidney: Secondary | ICD-10-CM | POA: Diagnosis not present

## 2022-12-07 DIAGNOSIS — Z8551 Personal history of malignant neoplasm of bladder: Secondary | ICD-10-CM | POA: Diagnosis not present

## 2022-12-07 DIAGNOSIS — Z08 Encounter for follow-up examination after completed treatment for malignant neoplasm: Secondary | ICD-10-CM | POA: Diagnosis not present

## 2022-12-07 DIAGNOSIS — Z1152 Encounter for screening for COVID-19: Secondary | ICD-10-CM | POA: Insufficient documentation

## 2022-12-07 DIAGNOSIS — R599 Enlarged lymph nodes, unspecified: Secondary | ICD-10-CM | POA: Insufficient documentation

## 2022-12-07 DIAGNOSIS — F418 Other specified anxiety disorders: Secondary | ICD-10-CM | POA: Diagnosis not present

## 2022-12-07 DIAGNOSIS — C3411 Malignant neoplasm of upper lobe, right bronchus or lung: Secondary | ICD-10-CM | POA: Diagnosis not present

## 2022-12-07 DIAGNOSIS — E785 Hyperlipidemia, unspecified: Secondary | ICD-10-CM | POA: Diagnosis not present

## 2022-12-07 HISTORY — PX: VIDEO BRONCHOSCOPY WITH ENDOBRONCHIAL ULTRASOUND: SHX6177

## 2022-12-07 HISTORY — PX: BRONCHIAL BRUSHINGS: SHX5108

## 2022-12-07 HISTORY — PX: BRONCHIAL BIOPSY: SHX5109

## 2022-12-07 HISTORY — DX: Malignant neoplasm of unspecified part of unspecified bronchus or lung: C34.90

## 2022-12-07 HISTORY — PX: BRONCHIAL NEEDLE ASPIRATION BIOPSY: SHX5106

## 2022-12-07 LAB — RESP PANEL BY RT-PCR (RSV, FLU A&B, COVID)  RVPGX2
Influenza A by PCR: NEGATIVE
Influenza B by PCR: NEGATIVE
Resp Syncytial Virus by PCR: NEGATIVE
SARS Coronavirus 2 by RT PCR: NEGATIVE

## 2022-12-07 SURGERY — BRONCHOSCOPY, WITH BIOPSY USING ELECTROMAGNETIC NAVIGATION
Anesthesia: General | Laterality: Right

## 2022-12-07 MED ORDER — DEXAMETHASONE SODIUM PHOSPHATE 10 MG/ML IJ SOLN
INTRAMUSCULAR | Status: DC | PRN
  Administered 2022-12-07: 10 mg via INTRAVENOUS

## 2022-12-07 MED ORDER — PROMETHAZINE HCL 25 MG/ML IJ SOLN: 6.2500 mg | INTRAMUSCULAR | Status: DC | PRN

## 2022-12-07 MED ORDER — ROCURONIUM BROMIDE 10 MG/ML (PF) SYRINGE
PREFILLED_SYRINGE | INTRAVENOUS | Status: DC | PRN
  Administered 2022-12-07: 70 mg via INTRAVENOUS
  Administered 2022-12-07: 10 mg via INTRAVENOUS

## 2022-12-07 MED ORDER — LIDOCAINE 2% (20 MG/ML) 5 ML SYRINGE
INTRAMUSCULAR | Status: DC | PRN
  Administered 2022-12-07: 60 mg via INTRAVENOUS

## 2022-12-07 MED ORDER — CHLORHEXIDINE GLUCONATE 0.12 % MT SOLN
OROMUCOSAL | Status: AC
  Filled 2022-12-07: qty 15

## 2022-12-07 MED ORDER — PROPOFOL 10 MG/ML IV BOLUS
INTRAVENOUS | Status: DC | PRN
  Administered 2022-12-07: 150 mg via INTRAVENOUS

## 2022-12-07 MED ORDER — ONDANSETRON HCL 4 MG/2ML IJ SOLN
INTRAMUSCULAR | Status: DC | PRN
  Administered 2022-12-07: 4 mg via INTRAVENOUS

## 2022-12-07 MED ORDER — PHENYLEPHRINE HCL-NACL 20-0.9 MG/250ML-% IV SOLN
INTRAVENOUS | Status: DC | PRN
  Administered 2022-12-07: 20 ug/min via INTRAVENOUS

## 2022-12-07 MED ORDER — OXYCODONE HCL 5 MG PO TABS: 5.0000 mg | ORAL_TABLET | Freq: Once | ORAL | Status: DC | PRN

## 2022-12-07 MED ORDER — CHLORHEXIDINE GLUCONATE 0.12 % MT SOLN
15.0000 mL | Freq: Once | OROMUCOSAL | Status: AC
  Administered 2022-12-07: 15 mL via OROMUCOSAL

## 2022-12-07 MED ORDER — OXYCODONE HCL 5 MG/5ML PO SOLN: 5.0000 mg | Freq: Once | ORAL | Status: DC | PRN

## 2022-12-07 MED ORDER — LACTATED RINGERS IV SOLN: INTRAVENOUS | Status: DC

## 2022-12-07 MED ORDER — AMISULPRIDE (ANTIEMETIC) 5 MG/2ML IV SOLN: 10.0000 mg | Freq: Once | INTRAVENOUS | Status: DC | PRN

## 2022-12-07 MED ORDER — SUGAMMADEX SODIUM 200 MG/2ML IV SOLN
INTRAVENOUS | Status: DC | PRN
  Administered 2022-12-07: 200 mg via INTRAVENOUS

## 2022-12-07 MED ORDER — HYDROMORPHONE HCL 1 MG/ML IJ SOLN: 0.2500 mg | INTRAMUSCULAR | Status: DC | PRN

## 2022-12-07 MED ORDER — PROPOFOL 500 MG/50ML IV EMUL
INTRAVENOUS | Status: DC | PRN
  Administered 2022-12-07: 25 ug/kg/min via INTRAVENOUS

## 2022-12-07 SURGICAL SUPPLY — 29 items

## 2022-12-07 NOTE — Op Note (Signed)
Video Bronchoscopy with Robotic Assisted Bronchoscopic Navigation  Video Bronchoscopy with Endobronchial Ultrasound Procedure Note  Date of Operation: 12/07/2022   Pre-op Diagnosis: Lung nodules, adenopathy   Post-op Diagnosis: Lung nodules, adenopathy   Surgeon: Alan Nash, DO   Assistants: None   Anesthesia: General endotracheal anesthesia  Operation: Flexible video fiberoptic bronchoscopy with robotic assistance and biopsies.  Estimated Blood Loss: Minimal  Complications: None  Indications and History: Alan Mendez is a 82 y.o. male with history of lung nodules, adenopathy. The risks, benefits, complications, treatment options and expected outcomes were discussed with the patient.  The possibilities of pneumothorax, pneumonia, reaction to medication, pulmonary aspiration, perforation of a viscus, bleeding, failure to diagnose a condition and creating a complication requiring transfusion or operation were discussed with the patient who freely signed the consent.    Description of Procedure: The patient was seen in the Preoperative Area, was examined and was deemed appropriate to proceed.  The patient was taken to [facility] endoscopy room 3, identified as Alan Mendez and the procedure verified as Flexible Video Fiberoptic Bronchoscopy.  A Time Out was held and the above information confirmed.   Prior to the date of the procedure a high-resolution CT scan of the chest was performed. Utilizing ION software program a virtual tracheobronchial tree was generated to allow the creation of distinct navigation pathways to the patient's parenchymal abnormalities. After being taken to the operating room general anesthesia was initiated and the patient  was orally intubated. The video fiberoptic bronchoscope was introduced via the endotracheal tube and a general inspection was performed which showed normal right and left lung anatomy, aspiration of the bilateral mainstems was completed to remove  any remaining secretions. Robotic catheter inserted into patient's endotracheal tube.   Target #1 Right upper lobe apical nodule: The distinct navigation pathways prepared prior to this procedure were then utilized to navigate to patient's lesion identified on CT scan. The robotic catheter was secured into place and the vision probe was withdrawn.  Lesion location was approximated using fluoroscopy and 3D CBCT for peripheral targeting. Under fluoroscopic guidance transbronchial needle brushings, transbronchial needle biopsies, and transbronchial forceps biopsies were performed to be sent for cytology and pathology.   Target #2 Station 4L: The standard scope was then withdrawn and the endobronchial ultrasound was used to identify and characterize the peritracheal, hilar and bronchial lymph nodes. Inspection showed normal airways and enlarged station 4L. Using real-time ultrasound guidance Wang needle biopsies were take from Station 4L nodes and were sent for cytology. The patient tolerated the procedure well without apparent complications. There was no significant blood loss. The bronchoscope was withdrawn.  At the end of the procedure a general airway inspection was performed and there was no evidence of active bleeding. The bronchoscope was removed.  The patient tolerated the procedure well. There was no significant blood loss and there were no obvious complications. A post-procedural chest x-ray is pending.  Samples Target #1: 1. Transbronchial needle brushings from RUL 2. Transbronchial Wang needle biopsies from RUL 3. Transbronchial forceps biopsies from RUL  Samples Target #2: 1. Wang needle biopsies from 4L node  Plans:  The patient will be discharged from the PACU to home when recovered from anesthesia and after chest x-ray is reviewed. We will review the cytology, pathology results with the patient when they become available. Outpatient followup will be with Alan Nash, DO.    Alan Nash, DO [facility] 12/07/2022 10:34 AM

## 2022-12-07 NOTE — Transfer of Care (Signed)
Immediate Anesthesia Transfer of Care Note  Patient: Alan Mendez  Procedure(s) Performed: ROBOTIC ASSISTED NAVIGATIONAL BRONCHOSCOPY (Right) VIDEO BRONCHOSCOPY WITH ENDOBRONCHIAL ULTRASOUND (Right) BRONCHIAL BIOPSIES BRONCHIAL NEEDLE ASPIRATION BIOPSIES BRONCHIAL BRUSHINGS  Patient Location: PACU  Anesthesia Type:General  Level of Consciousness: drowsy and patient cooperative  Airway & Oxygen Therapy: Patient Spontanous Breathing  Post-op Assessment: Report given to RN and Post -op Vital signs reviewed and stable  Post vital signs: Reviewed and stable  Last Vitals:  Vitals Value Taken Time  BP 140/81 12/07/22 1030  Temp 36.6 C 12/07/22 1030  Pulse 73 12/07/22 1030  Resp 8 12/07/22 1030  SpO2 95 % 12/07/22 1030  Vitals shown include unvalidated device data.  Last Pain:  Vitals:   12/07/22 1030  PainSc: 0-No pain      Patients Stated Pain Goal: 0 (15/52/08 0223)  Complications: No notable events documented.

## 2022-12-07 NOTE — Anesthesia Preprocedure Evaluation (Signed)
Anesthesia Evaluation  Patient identified by MRN, date of birth, ID band Patient awake    Reviewed: Allergy & Precautions, H&P , NPO status , Patient's Chart, lab work & pertinent test results, reviewed documented beta blocker date and time   Airway Mallampati: II  TM Distance: >3 FB Neck ROM: full    Dental no notable dental hx.    Pulmonary neg pulmonary ROS, former smoker   Pulmonary exam normal breath sounds clear to auscultation       Cardiovascular negative cardio ROS  Rhythm:regular Rate:Normal     Neuro/Psych   Anxiety Depression    negative neurological ROS  negative psych ROS   GI/Hepatic negative GI ROS, Neg liver ROS,,,  Endo/Other  negative endocrine ROS    Renal/GU negative Renal ROS  negative genitourinary   Musculoskeletal negative musculoskeletal ROS (+)    Abdominal   Peds negative pediatric ROS (+)  Hematology negative hematology ROS (+)   Anesthesia Other Findings Hx of bladder cancer  Reproductive/Obstetrics negative OB ROS                             Anesthesia Physical Anesthesia Plan  ASA: 3  Anesthesia Plan: General   Post-op Pain Management: Minimal or no pain anticipated   Induction: Intravenous  PONV Risk Score and Plan: 2 and Ondansetron, Midazolam and Treatment may vary due to age or medical condition  Airway Management Planned: Oral ETT  Additional Equipment:   Intra-op Plan:   Post-operative Plan: Extubation in OR  Informed Consent: I have reviewed the patients History and Physical, chart, labs and discussed the procedure including the risks, benefits and alternatives for the proposed anesthesia with the patient or authorized representative who has indicated his/her understanding and acceptance.     Dental Advisory Given  Plan Discussed with: CRNA and Surgeon  Anesthesia Plan Comments: ( )        Anesthesia Quick Evaluation

## 2022-12-07 NOTE — Progress Notes (Signed)
Patient brought to hospital on DOS by friend, Susette Racer.  This nurse spoke with Susette Racer and Clair Gulling stated that he would be taking the patient home and that he would stay with the patient for the first 24 hours.

## 2022-12-07 NOTE — Anesthesia Procedure Notes (Signed)
Procedure Name: Intubation Date/Time: 12/07/2022 9:08 AM  Performed by: Thelma Comp, CRNAPre-anesthesia Checklist: Patient identified, Emergency Drugs available, Suction available and Patient being monitored Patient Re-evaluated:Patient Re-evaluated prior to induction Oxygen Delivery Method: Circle System Utilized Preoxygenation: Pre-oxygenation with 100% oxygen Induction Type: IV induction Ventilation: Mask ventilation without difficulty Laryngoscope Size: Mac and 4 Grade View: Grade II Tube type: Oral Tube size: 8.5 mm Number of attempts: 1 Airway Equipment and Method: Stylet Placement Confirmation: ETT inserted through vocal cords under direct vision, positive ETCO2 and breath sounds checked- equal and bilateral Secured at: 22 cm Tube secured with: Tape Dental Injury: Teeth and Oropharynx as per pre-operative assessment

## 2022-12-07 NOTE — Anesthesia Postprocedure Evaluation (Signed)
Anesthesia Post Note  Patient: Alan Mendez  Procedure(s) Performed: ROBOTIC ASSISTED NAVIGATIONAL BRONCHOSCOPY (Right) VIDEO BRONCHOSCOPY WITH ENDOBRONCHIAL ULTRASOUND (Right) BRONCHIAL BIOPSIES BRONCHIAL NEEDLE ASPIRATION BIOPSIES BRONCHIAL BRUSHINGS     Patient location during evaluation: PACU Anesthesia Type: General Level of consciousness: awake and alert Pain management: pain level controlled Vital Signs Assessment: post-procedure vital signs reviewed and stable Respiratory status: spontaneous breathing, nonlabored ventilation and respiratory function stable Cardiovascular status: blood pressure returned to baseline and stable Postop Assessment: no apparent nausea or vomiting Anesthetic complications: no   No notable events documented.  Last Vitals:  Vitals:   12/07/22 1045 12/07/22 1100  BP: (!) 158/87 (!) 149/85  Pulse: 69 65  Resp: 11 14  Temp:  36.6 C  SpO2: 95% 96%    Last Pain:  Vitals:   12/07/22 1030  PainSc: 0-No pain                 Lynda Rainwater

## 2022-12-07 NOTE — Interval H&P Note (Signed)
History and Physical Interval Note:  12/07/2022 7:29 AM  Alan Mendez  has presented today for surgery, with the diagnosis of lung nodule, adenopathy.  The various methods of treatment have been discussed with the patient and family. After consideration of risks, benefits and other options for treatment, the patient has consented to  Procedure(s): ROBOTIC ASSISTED NAVIGATIONAL BRONCHOSCOPY (Right) VIDEO BRONCHOSCOPY WITH ENDOBRONCHIAL ULTRASOUND (Right) as a surgical intervention.  The patient's history has been reviewed, patient examined, no change in status, stable for surgery.  I have reviewed the patient's chart and labs.  Questions were answered to the patient's satisfaction.     [place]

## 2022-12-07 NOTE — Discharge Instructions (Signed)

## 2022-12-08 ENCOUNTER — Encounter (HOSPITAL_COMMUNITY): Payer: Self-pay | Admitting: Pulmonary Disease

## 2022-12-09 ENCOUNTER — Encounter: Payer: Self-pay | Admitting: Pulmonary Disease

## 2022-12-09 ENCOUNTER — Telehealth: Payer: Self-pay | Admitting: Pulmonary Disease

## 2022-12-09 NOTE — Telephone Encounter (Signed)
He had bronchoscopy with Dr. Valeta Harms on 12/07/22.  Since then he has congestion in his chest.  He gets a wheeze that clears when he changes position or coughs.  He can sometimes bring up clear sputum.  His throat is a little scratchy and sore since he had the procedure.  Denies hemoptysis, fever, chest pain, dyspnea, sinus congestion.  Explained he likely has airway irritation after bronchoscopy, but doesn't appear to have a bacterial infection that would require antibiotics at this time.  Advised him to try OTC mucinex.  Discussed symptoms to monitor for that would indicate he is developing a bacterial infection if his symptoms progress and advised him to contact the office again if this happens.

## 2022-12-11 ENCOUNTER — Telehealth: Payer: Self-pay | Admitting: Pulmonary Disease

## 2022-12-11 LAB — CYTOLOGY - NON PAP

## 2022-12-11 MED ORDER — AZITHROMYCIN 250 MG PO TABS: ORAL_TABLET | ORAL | 0 refills | Status: DC

## 2022-12-11 NOTE — Telephone Encounter (Signed)
Dr. Valeta Harms pt is c/o congestion since bronch done on 12/07/22. I do believe Dr. Halford Chessman sent you a telephone encounter on the matter as well. Pt would like to know if congestion can be sign of infection. Please advise.

## 2022-12-11 NOTE — Telephone Encounter (Signed)
Alan Nash, DO  You; Lb[facility] Triage Pool5 minutes ago (1:16 PM)    Start him on a z-pack Thanks  BLI    Called and spoke with pt letting him know recs per Dr. Valeta Harms and he verbalized understanding. Nothing further needed.

## 2022-12-11 NOTE — Telephone Encounter (Signed)
Noted  

## 2022-12-11 NOTE — Telephone Encounter (Signed)
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 Musinex was recommended and it has helped but he is still congested and wonders if Dr. Should call in some Antibx or if he should call his primary Dr. Frances Furbish call back at the number on file. TY

## 2022-12-11 NOTE — Telephone Encounter (Signed)
Called and spoke with pt who states that he has had some chest congestion as well as postnasal drainage. Asked pt if he had any problems with a cough and he said that he seldom has a cough.  Pt has been taking mucinex which was recommended to see if that would help with the congestion which he said has helped clear some of the congestion up.  What pt is wanting to know is if an abx could be prescribed so it could hopefully clear all of the problems up especially if he is to have surgery later on.  Dr. Valeta Harms, please advise on this for pt.

## 2022-12-12 ENCOUNTER — Ambulatory Visit (HOSPITAL_COMMUNITY)
Admission: RE | Admit: 2022-12-12 | Discharge: 2022-12-12 | Disposition: A | Discharge status: Home / Self Care | Payer: Medicare Other | Source: Ambulatory Visit | Attending: Internal Medicine | Admitting: Internal Medicine

## 2022-12-12 DIAGNOSIS — C3490 Malignant neoplasm of unspecified part of unspecified bronchus or lung: Secondary | ICD-10-CM | POA: Diagnosis not present

## 2022-12-12 DIAGNOSIS — G9389 Other specified disorders of brain: Secondary | ICD-10-CM | POA: Diagnosis not present

## 2022-12-12 MED ORDER — GADOBUTROL 1 MMOL/ML IV SOLN
6.0000 mL | Freq: Once | INTRAVENOUS | Status: AC | PRN
  Administered 2022-12-12: 6 mL via INTRAVENOUS

## 2022-12-13 LAB — CYTOLOGY - NON PAP

## 2022-12-14 ENCOUNTER — Ambulatory Visit: Payer: Medicare Other | Admitting: Internal Medicine

## 2022-12-14 ENCOUNTER — Telehealth: Payer: Self-pay | Admitting: Internal Medicine

## 2022-12-14 ENCOUNTER — Encounter (HOSPITAL_COMMUNITY)
Admission: RE | Admit: 2022-12-14 | Discharge: 2022-12-14 | Disposition: A | Discharge status: Home / Self Care | Payer: Medicare Other | Source: Ambulatory Visit | Attending: Internal Medicine | Admitting: Internal Medicine

## 2022-12-14 ENCOUNTER — Other Ambulatory Visit: Payer: Medicare Other

## 2022-12-14 DIAGNOSIS — I2510 Atherosclerotic heart disease of native coronary artery without angina pectoris: Secondary | ICD-10-CM | POA: Diagnosis not present

## 2022-12-14 DIAGNOSIS — R911 Solitary pulmonary nodule: Secondary | ICD-10-CM | POA: Insufficient documentation

## 2022-12-14 DIAGNOSIS — E041 Nontoxic single thyroid nodule: Secondary | ICD-10-CM | POA: Insufficient documentation

## 2022-12-14 DIAGNOSIS — K8020 Calculus of gallbladder without cholecystitis without obstruction: Secondary | ICD-10-CM | POA: Diagnosis not present

## 2022-12-14 DIAGNOSIS — N400 Enlarged prostate without lower urinary tract symptoms: Secondary | ICD-10-CM | POA: Diagnosis not present

## 2022-12-14 DIAGNOSIS — R590 Localized enlarged lymph nodes: Secondary | ICD-10-CM | POA: Diagnosis not present

## 2022-12-14 DIAGNOSIS — C3490 Malignant neoplasm of unspecified part of unspecified bronchus or lung: Secondary | ICD-10-CM | POA: Insufficient documentation

## 2022-12-14 DIAGNOSIS — Z8709 Personal history of other diseases of the respiratory system: Secondary | ICD-10-CM | POA: Diagnosis not present

## 2022-12-14 DIAGNOSIS — I700 Atherosclerosis of aorta: Secondary | ICD-10-CM | POA: Insufficient documentation

## 2022-12-14 DIAGNOSIS — Z85528 Personal history of other malignant neoplasm of kidney: Secondary | ICD-10-CM | POA: Diagnosis not present

## 2022-12-14 DIAGNOSIS — Z8551 Personal history of malignant neoplasm of bladder: Secondary | ICD-10-CM | POA: Diagnosis not present

## 2022-12-14 LAB — GLUCOSE, CAPILLARY: Glucose-Capillary: 90 mg/dL (ref 70–99)

## 2022-12-14 MED ORDER — FLUDEOXYGLUCOSE F - 18 (FDG) INJECTION
7.3000 | Freq: Once | INTRAVENOUS | Status: AC
  Administered 2022-12-14: 7.3 via INTRAVENOUS

## 2022-12-14 NOTE — Telephone Encounter (Signed)
Called patient regarding upcoming February appointments, left a voicemail.

## 2022-12-18 ENCOUNTER — Inpatient Hospital Stay (HOSPITAL_BASED_OUTPATIENT_CLINIC_OR_DEPARTMENT_OTHER): Payer: Medicare Other | Admitting: Internal Medicine

## 2022-12-18 VITALS — BP 147/72 | HR 80 | Temp 98.1°F | Resp 16 | Wt 146.5 lb

## 2022-12-18 DIAGNOSIS — Z510 Encounter for antineoplastic radiation therapy: Secondary | ICD-10-CM | POA: Insufficient documentation

## 2022-12-18 DIAGNOSIS — Z5111 Encounter for antineoplastic chemotherapy: Secondary | ICD-10-CM | POA: Insufficient documentation

## 2022-12-18 DIAGNOSIS — C3491 Malignant neoplasm of unspecified part of right bronchus or lung: Secondary | ICD-10-CM

## 2022-12-18 DIAGNOSIS — C3411 Malignant neoplasm of upper lobe, right bronchus or lung: Secondary | ICD-10-CM | POA: Insufficient documentation

## 2022-12-18 MED ORDER — PROCHLORPERAZINE MALEATE 10 MG PO TABS: 10.0000 mg | ORAL_TABLET | Freq: Four times a day (QID) | ORAL | 0 refills | Status: DC | PRN

## 2022-12-18 NOTE — Progress Notes (Signed)
Thoracic Location of Tumor / Histology: Right Upper Lobe, enlarged right paratracheal lymph node.  Patient presented {numbers 1-12:19994} months ago with symptoms of: ***  PET 12/14/2022: Two hypermetabolic right upper lobe pulmonary nodules, consistent with neoplasm.  Hypermetabolic retrotracheal lymph node, consistent with metastatic disease. 3. Hypermetabolic 13 mm left thyroid nodule. Recommend thyroid US and biopsy.  No evidence for hypermetabolic metastatic disease in the abdomen or pelvis.  Status post right nephroureterectomy.  MRI Brain 12/12/2022: No acute intracranial process. No evidence of metastatic disease in the brain.  3 mm enhancing focus in the head of the left mandible, which is indeterminate but concerning for a small focus of metastatic disease given the patient's history.  T2 hyperintense lesion in the right suboccipital soft tissues, which is T1 hypointense with minimal focal enhancement, measuring up to 1.3 cm, which is also indeterminate.   Biopsies of RUL and Lymph Node 12/07/2022     Tobacco/Marijuana/Snuff/ETOH use: Former Smoker, quit in 1987.  Past/Anticipated interventions by cardiothoracic surgery, if any: {:18581}  Past/Anticipated interventions by medical oncology, if any: {:18581}  Signs/Symptoms Weight changes, if any: {:18581} Respiratory complaints, if any: {:18581} Hemoptysis, if any: {:18581} Pain issues, if any:  {:18581}  SAFETY ISSUES: Prior radiation? {:18581} Pacemaker/ICD? {:18581}  Possible current pregnancy?{:18581} Is the patient on methotrexate? {:18581}  Current Complaints / other details:  ***

## 2022-12-18 NOTE — Progress Notes (Signed)
START ON PATHWAY REGIMEN - Non-Small Cell Lung     A cycle is every 7 days, concurrent with RT:     Paclitaxel      Carboplatin   **Always confirm dose/schedule in your pharmacy ordering system**  Patient Characteristics: Preoperative or Nonsurgical Candidate (Clinical Staging), Stage III - Nonsurgical Candidate (Nonsquamous and Squamous), PS = 0, 1 Therapeutic Status: Preoperative or Nonsurgical Candidate (Clinical Staging) AJCC T Category: cT3 AJCC N Category: cN2 AJCC M Category: cM0 AJCC 8 Stage Grouping: IIIB ECOG Performance Status: 1 Intent of Therapy: Curative Intent, Discussed with Patient

## 2022-12-18 NOTE — Progress Notes (Signed)
Alan [phone]   [phone]  OFFICE PROGRESS NOTE  [name], [name], MD [place] [address]  DIAGNOSIS: Stage IIIb (T3, N2, M0) non-small cell lung cancer, squamous cell carcinoma presented with 2 separate right upper lobe lung nodule in addition to mediastinal lymphadenopathy diagnosed in January 2024  PRIOR THERAPY: None  CURRENT THERAPY: A course of concurrent chemoradiation with weekly carboplatin for AUC of 2 and paclitaxel 45 Mg/M2.  First dose expected December 25, 2022.  INTERVAL HISTORY: Alan Mendez 82 y.o. male returns to the clinic today for follow-up visit accompanied by his friend Alan Mendez.  His fiance Alan Mendez was available by phone during the visit.  The patient is feeling fine today with no concerning complaints.  The patient has a plan for his wedding in [place] on February 17, 2023.  He denied having any current chest pain, shortness of breath, cough or hemoptysis.  He has no nausea, vomiting, diarrhea or constipation.  He has no headache or visual changes.  He has no recent weight loss or night sweats.  He had several studies performed recently including MRI of the brain as well as a PET scan and bronchoscopy with biopsy of the right upper lobe lung nodule by Dr. Valeta Harms.  He is here today for evaluation and discussion of his treatment options based on the recent imaging studies and biopsy results.  MEDICAL HISTORY: Past Medical History:  Diagnosis Date   Acquired solitary kidney    left --  s/p  right nephroureterctomy 01/ 2018   Anxiety    no current problems   Depression    no current problems   Elevated PSA    Enlarged lymph node 11/2022   enlarged right paratracheal lymph node.   History of adenomatous polyp of colon    tubular adenoma 2013   History of kidney cancer urologist-  dr Louis Meckel   dx 11/ 2017--  11-15-2016  s/p  right nephoureterectomy (per path report-- low grade papillay  urothelial carcinoma in situ involving renal pelvis, negative margins)   History of kidney stones    passed stones and also surgery to remove   History of unilateral nephrectomy    01/ 2018  right nephroureterectomy for renal pelvis mass (carcinoma in situ)   HLD (hyperlipidemia)    on crestor   Hyperplasia of prostate without lower urinary tract symptoms (LUTS)    Nephrolithiasis    bilateral non-obstructive   Pneumonia    x 1 - yrs ago   Pulmonary nodules 11/2022   2 right upper lobe pulmonary nodules   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HCC)    Renal cyst, acquired, right    right kidney removed   Wears glasses     ALLERGIES:  is allergic to lamisil [terbinafine].  MEDICATIONS:  Current Outpatient Medications  Medication Sig Dispense Refill   azithromycin (ZITHROMAX) 250 MG tablet Take two today and then one daily until finished. 6 tablet 0   acetaminophen (TYLENOL) 500 MG tablet Take 1,000 mg by mouth every 6 (six) hours as needed for headache.     cholecalciferol (VITAMIN D3) 25 MCG (1000 UNIT) tablet Take 1,000 Units by mouth daily.     CRANBERRY PO Take 650 mg by mouth daily.     Multiple Vitamins-Minerals (MULTIVITAMIN WITH MINERALS) tablet Take 1 tablet by mouth daily. Centrum     rosuvastatin (CRESTOR) 10 MG tablet TAKE 1 TABLET EACH DAY. (Patient taking differently: Take  20 mg by mouth daily.) 30 tablet 0   tamsulosin (FLOMAX) 0.4 MG CAPS capsule Take 1 capsule (0.4 mg total) by mouth daily. (Patient taking differently: Take 0.4 mg by mouth daily after breakfast.) 30 capsule 5   No current facility-administered medications for this visit.    SURGICAL HISTORY:  Past Surgical History:  Procedure Laterality Date   APPENDECTOMY  child   BRONCHIAL BIOPSY  12/07/2022   Procedure: BRONCHIAL BIOPSIES;  Surgeon: Garner Nash, DO;  Location: [facility];  Service: Pulmonary;;   BRONCHIAL BRUSHINGS  12/07/2022   Procedure: BRONCHIAL BRUSHINGS;  Surgeon: Garner Nash, DO;  Location: [facility];  Service: Pulmonary;;   BRONCHIAL NEEDLE ASPIRATION BIOPSY  12/07/2022   Procedure: BRONCHIAL NEEDLE ASPIRATION BIOPSIES;  Surgeon: Garner Nash, DO;  Location: [facility];  Service: Pulmonary;;   CATARACT EXTRACTION W/ INTRAOCULAR LENS  IMPLANT, BILATERAL  2017   COLONOSCOPY  08/15/2012   Tubular Adenoma, No high grade dysplasia or malignacy.   CYSTOSCOPY W/ RETROGRADES Right 09/25/2016   Procedure: CYSTOSCOPY, URETHRAL DILITATION  WITH RETROGRADE PYELOGRAM, BRUSH BIOPSIES OF RIGHT RENAL MASS;  Surgeon: Carolan Clines, MD;  Location: [facility];  Service: Urology;  Laterality: Right;   CYSTOSCOPY W/ RETROGRADES Left 03/16/2017   Procedure: CYSTOSCOPY WITH RETROGRADE PYELOGRAM;  Surgeon: Ardis Hughs, MD;  Location: [facility];  Service: Urology;  Laterality: Left;   CYSTOSCOPY W/ URETERAL STENT REMOVAL Right 09/25/2016   Procedure: CYSTOSCOPY WITH STENT REMOVAL;  Surgeon: Carolan Clines, MD;  Location: [facility];  Service: Urology;  Laterality: Right;   CYSTOSCOPY WITH RETROGRADE PYELOGRAM, URETEROSCOPY AND STENT PLACEMENT Right 08/21/2016   Procedure: CYSTOSCOPY WITH RIGHT RETROGRADE PYELOGRAM, RIGHT FLEXIBLE AND RIGID URETEROSCOPY, INSERTION DOUBLE J STENT RIGHT;  Surgeon: Carolan Clines, MD;  Location: [facility];  Service: Urology;  Laterality: Right;   EXTRACORPOREAL SHOCK WAVE LITHOTRIPSY  2009   KNEE SURGERY Right 1980's   ROBOT ASSITED LAPAROSCOPIC NEPHROURETERECTOMY Right 11/15/2016   Procedure: RIGHT XI ROBOT ASSITED LAPAROSCOPIC NEPHROURETERECTOMY;  Surgeon: Ardis Hughs, MD;  Location: [facility];  Service: Urology;  Laterality: Right;   TRANSURETHRAL RESECTION OF BLADDER TUMOR WITH MITOMYCIN-C Left 03/16/2017   Procedure: CYSTOSCOPY BIOPSIES OF BLADDER TUMOR WITH FULGURATION;  Surgeon: Ardis Hughs, MD;  Location[facility];  Service:  Urology;  Laterality: Left;   URETEROSCOPY Right 09/25/2016   Procedure: RIGHT URETEROSCOPY;  Surgeon: Carolan Clines, MD;  Location: [facility];  Service: Urology;  Laterality: Right;   VIDEO BRONCHOSCOPY WITH ENDOBRONCHIAL ULTRASOUND Right 12/07/2022   Procedure: VIDEO BRONCHOSCOPY WITH ENDOBRONCHIAL ULTRASOUND;  Surgeon: Garner Nash, DO;  Location: [facility];  Service: Pulmonary;  Laterality: Right;    REVIEW OF SYSTEMS:  Constitutional: negative Eyes: negative Ears, nose, mouth, throat, and face: negative Respiratory: negative Cardiovascular: negative Gastrointestinal: negative Genitourinary:negative Integument/breast: negative Hematologic/lymphatic: negative Musculoskeletal:negative Neurological: negative Behavioral/Psych: negative Endocrine: negative Allergic/Immunologic: negative   PHYSICAL EXAMINATION: General appearance: alert, cooperative, and no distress Head: Normocephalic, without obvious abnormality, atraumatic Neck: no adenopathy, no JVD, supple, symmetrical, trachea midline, and thyroid not enlarged, symmetric, no tenderness/mass/nodules Lymph nodes: Cervical, supraclavicular, and axillary nodes normal. Resp: clear to auscultation bilaterally Back: symmetric, no curvature. ROM normal. No CVA tenderness. Cardio: regular rate and rhythm, S1, S2 normal, no murmur, click, rub or gallop GI: soft, non-tender; bowel sounds normal; no masses,  no organomegaly Extremities: extremities normal, atraumatic, no cyanosis or edema Neurologic: Alert and oriented X  3, normal strength and tone. Normal symmetric reflexes. Normal coordination and gait  ECOG PERFORMANCE STATUS: 1 - Symptomatic but completely ambulatory  There were no vitals taken for this visit.  LABORATORY DATA: Lab Results  Component Value Date   WBC 6.4 11/30/2022   HGB 14.8 11/30/2022   HCT 43.5 11/30/2022   MCV 85.1 11/30/2022   PLT 264 11/30/2022      Chemistry       Component Value Date/Time   NA 140 11/30/2022 0700   K 3.9 11/30/2022 0700   CL 107 11/30/2022 0700   CO2 26 11/30/2022 0700   BUN 20 11/30/2022 0700   CREATININE 1.07 11/30/2022 0700   CREATININE 0.85 12/10/2014 1550      Component Value Date/Time   CALCIUM 8.9 11/30/2022 0700   ALKPHOS 65 11/30/2022 0700   AST 22 11/30/2022 0700   ALT 17 11/30/2022 0700   BILITOT 0.7 11/30/2022 0700       RADIOGRAPHIC STUDIES: NM PET Image Initial (PI) Skull Base To Thigh (F-18 FDG)  Result Date: 12/15/2022 CLINICAL DATA:  Initial treatment strategy for lung cancer. Bronchoscopy 12/07/2022 documented non-small-cell carcinoma in a right upper lobe pulmonary lesion. Patient with personal history of urothelial malignancy status post right nephro ureterectomy and history of bladder cancer. EXAM: NUCLEAR MEDICINE PET SKULL BASE TO THIGH TECHNIQUE: 7.3 mCi F-18 FDG was injected intravenously. Full-ring PET imaging was performed from the skull base to thigh after the radiotracer. CT data was obtained and used for attenuation correction and anatomic localization. Fasting blood glucose: 90 mg/dl COMPARISON:  Chest, abdomen and pelvis CT 11/22/2022 FINDINGS: Mediastinal blood pool activity: SUV max 2.6 Liver activity: SUV max NA NECK: No hypermetabolic lymph nodes in the neck. Incidental CT findings: None. CHEST: 13 mm left thyroid nodule is hypermetabolic with SUV max = 02.5 19 mm nodule medial right upper lobe on 55/4 is markedly hypermetabolic with SUV max = 9.3. A second paraspinal right upper lobe nodule is identified slightly more inferiorly measuring 18 mm on 62/4. This is also hypermetabolic with SUV max = 42.7. 21 mm short axis retrotracheal node on 06/2 is hypermetabolic with SUV max = 5.8. No hypermetabolic axillary or left hilar lymphadenopathy. No additional hypermetabolic pulmonary nodule or mass lesion evident. Incidental CT findings: There is mild atherosclerotic calcification of the abdominal aorta  without aneurysm. Coronary artery calcification is evident. No pulmonary edema or pleural effusion. ABDOMEN/PELVIS: No abnormal hypermetabolic activity within the liver, pancreas, adrenal glands, or spleen. No hypermetabolic lymph nodes in the abdomen or pelvis. Incidental CT findings: 8 mm low-density lesion in the medial segment left liver shows no hypermetabolism, compatible with a cyst. No followup imaging is recommended. 13 mm calcified gallstone evident. Right kidney surgically absent. 12 mm water is the lesion upper pole left kidney is compatible with a cyst. No followup imaging is recommended. There is mild atherosclerotic calcification of the abdominal aorta without aneurysm. Diverticular disease noted left colon without diverticulitis. Prostate gland is enlarged. SKELETON: No focal hypermetabolic activity to suggest skeletal metastasis. Incidental CT findings: Thoracolumbar scoliosis evident. IMPRESSION: 1. Two hypermetabolic right upper lobe pulmonary nodules, consistent with neoplasm. 2. Hypermetabolic retrotracheal lymph node, consistent with metastatic disease. 3. Hypermetabolic 13 mm left thyroid nodule. Recommend thyroid US and biopsy. (Ref: J Am Coll Radiol. 2015 Feb;12(2): 143-50). 4. No evidence for hypermetabolic metastatic disease in the abdomen or pelvis. 5. Status post right nephroureterectomy. 6. Cholelithiasis. 7. Left colonic diverticulosis without diverticulitis. 8. Prostatomegaly. 9.  Aortic Atherosclerosis (ICD10-I70.0).  Electronically Signed   By: Misty Stanley M.D.   On: 12/15/2022 10:20   MR BRAIN W WO CONTRAST  Result Date: 12/12/2022 CLINICAL DATA:  Non-small cell lung cancer, staging EXAM: MRI HEAD WITHOUT AND WITH CONTRAST TECHNIQUE: Multiplanar, multiecho pulse sequences of the brain and surrounding structures were obtained without and with intravenous contrast. CONTRAST:  38mL GADAVIST GADOBUTROL 1 MMOL/ML IV SOLN COMPARISON:  No prior MRI available FINDINGS: Brain: No  restricted diffusion to suggest acute or subacute infarct. No abnormal parenchymal or meningeal enhancement. No acute hemorrhage, mass, mass effect, or midline shift. No hydrocephalus or extra-axial collection. Normal pituitary and craniocervical junction. No hemosiderin deposition to suggest remote hemorrhage. Scattered T2 hyperintense signal in the periventricular white matter, likely the sequela of minimal chronic small vessel ischemic disease. Vascular: Patent arterial flow voids. Normal arterial and venous enhancement. Skull and upper cervical spine: 3 mm enhancing focus in the head of the left mandible (series 16, image 40). Otherwise normal marrow signal. T2 hyperintense lesion in the right suboccipital soft tissues (series 9, image 4), which is T1 hypointense with minimal focal enhancement, measuring up to 1.2 x 1.1 x 1.3 cm (AP x TR x CC) (series 16, image 23 and series 17, image 7). Sinuses/Orbits: Clear paranasal sinuses. No acute finding in the orbits. Status post bilateral lens replacements. Other: The mastoid air cells are well aerated. IMPRESSION: 1. No acute intracranial process. No evidence of metastatic disease in the brain. 2. 3 mm enhancing focus in the head of the left mandible, which is indeterminate but concerning for a small focus of metastatic disease given the patient's history. 3. T2 hyperintense lesion in the right suboccipital soft tissues, which is T1 hypointense with minimal focal enhancement, measuring up to 1.3 cm, which is also indeterminate. Electronically Signed   By: Merilyn Baba M.D.   On: 12/12/2022 17:18   DG CHEST PORT 1 VIEW  Result Date: 12/07/2022 CLINICAL DATA:  Post bronchoscopy EXAM: PORTABLE CHEST 1 VIEW COMPARISON:  Portable exam 1046 hours compared to CT exam of 11/22/2022 FINDINGS: Normal heart size, mediastinal contours, and pulmonary vascularity. Atherosclerotic calcification aorta. Linear subsegmental atelectasis medial LEFT lower lobe. RIGHT lung  nodules/masses identified on prior CT are poorly demonstrated on current radiographic exam. No acute infiltrate, pleural effusion, or pneumothorax. Bones demineralized with biconvex scoliosis. IMPRESSION: No pneumothorax. Streaky atelectasis medial LEFT lower lobe. Previously identified RIGHT lung nodules/masses poorly visualized. Electronically Signed   By: Lavonia Dana M.D.   On: 12/07/2022 11:00   DG C-ARM BRONCHOSCOPY  Result Date: 12/07/2022 C-ARM BRONCHOSCOPY: Fluoroscopy was utilized by the requesting physician.  No radiographic interpretation.   CT CHEST ABDOMEN PELVIS W CONTRAST  Result Date: 11/23/2022 CLINICAL DATA:  Urothelial malignancy with right nephroureterectomy in 2018. Prior bladder cancer. Recent neck CT revealed evidence of active malignancy in the chest. * Tracking Code: BO * EXAM: CT CHEST, ABDOMEN, AND PELVIS WITH CONTRAST TECHNIQUE: Multidetector CT imaging of the chest, abdomen and pelvis was performed following the standard protocol during bolus administration of intravenous contrast. RADIATION DOSE REDUCTION: This exam was performed according to the departmental dose-optimization program which includes automated exposure control, adjustment of the mA and/or kV according to patient size and/or use of iterative reconstruction technique. CONTRAST:  62mL OMNIPAQUE IOHEXOL 300 MG/ML  SOLN COMPARISON:  Multiple exams, including 03/23/2020 and recent neck CT from 11/21/2022 FINDINGS: CT CHEST FINDINGS Cardiovascular: Mild thoracic aortic atherosclerotic vascular disease. Mediastinum/Nodes: Pathologic right paratracheal node 2.1 cm in short axis on image  18 series 2. Lungs/Pleura: Right upper lobe nodule, 2.0 by 1.6 cm on image 35 series 4. A separate right upper lobe nodule with somewhat irregular margins measures 2.0 by 1.7 cm on image 52 of series 4, and may have subtle internal air bronchograms or cavitation. Musculoskeletal: Dextroconvex upper thoracic and levoconvex lower thoracic  scoliosis with rotary component. No compelling findings of osseous metastatic disease. CT ABDOMEN PELVIS FINDINGS Hepatobiliary: A sharply defined fluid density lesion in segment 4a of the liver measures 1.1 by 1.0 cm on image 49 of series 2 and is likely a cyst. This measured 0.8 cm in diameter on 03/23/2020. 1.3 cm gallstone noted in the gallbladder. Pancreas: Unremarkable Spleen: Unremarkable Adrenals/Urinary Tract: Both adrenal glands appear normal. Right nephroureterectomy. The prostate gland indents the bladder base but no bladder mass is currently identified. Fluid density left kidney upper pole cyst appears benign and similar to the 2021 exam. No abnormal enhancement is identified along the remaining urothelium. Stomach/Bowel: Sigmoid colon diverticulosis. No findings of active diverticulitis. Vascular/Lymphatic: Atherosclerosis is present, including aortoiliac atherosclerotic disease. No pathologic adenopathy in the abdomen or pelvis. Reproductive: Notable prostatomegaly. Other: No supplemental non-categorized findings. Musculoskeletal: Chronic lucent lesion of the left iliac bone measuring 1.8 by 1.0 cm, with thin sclerotic margin and no substantial change from 2021, benign. Levoconvex thoracolumbar scoliosis. IMPRESSION: 1. Two right upper lobe pulmonary nodules, the larger of which measures 2.0 by 1.6 cm and the smaller 2.0 by 1.7 cm. There is also a pathologically enlarged right paratracheal lymph node measuring 2.1 cm in short axis. The appearance favors malignancy over atypical infection with reactive adenopathy. Tissue diagnosis is likely indicated. 2. No findings of metastatic disease to the abdomen/pelvis or osseous structures. 3. Aortic atherosclerosis. 4. Sigmoid colon diverticulosis. 5. Prior right nephroureterectomy. 6. Prostatomegaly. 7. Cholelithiasis. Aortic Atherosclerosis (ICD10-I70.0). Electronically Signed   By: Van Clines M.D.   On: 11/23/2022 16:12   CT SOFT TISSUE NECK WO  CONTRAST  Addendum Date: 11/21/2022   ADDENDUM REPORT: 11/21/2022 09:54 ADDENDUM: Study discussed by Mendez with Dr. [name]. [name] (covering for provider STEPHANIE EDWARDS) on 11/21/2022 at 0945 hours. Electronically Signed   By: Genevie Ann M.D.   On: 11/21/2022 09:54   Result Date: 11/21/2022 CLINICAL DATA:  82 year old male with left neck mass discovered 3 days ago. Remote history of renal or urothelial carcinoma status post right nephrectomy in 2018. EXAM: CT NECK WITHOUT CONTRAST TECHNIQUE: Multidetector CT imaging of the neck was performed following the standard protocol without intravenous contrast. RADIATION DOSE REDUCTION: This exam was performed according to the departmental dose-optimization program which includes automated exposure control, adjustment of the mA and/or kV according to patient size and/or use of iterative reconstruction technique. COMPARISON:  Cervical spine CT 03/15/2019. FINDINGS: Pharynx and larynx: Noncontrast larynx, pharynx, parapharyngeal, and retropharyngeal spaces appear stable and within normal limits. Salivary glands: Noncontrast parotid and submandibular glands appear symmetric and within normal limits. Negative noncontrast sublingual space. Thyroid: Round 15 mm hypodense left thyroid nodule has not significantly changed since 2020 and significance is doubtful. In the setting of significant comorbidities or limited life expectancy, no follow-up recommended (ref: J Am Coll Radiol. 2015 Feb;12(2): 143-50). Lymph nodes: No marked area of clinical concern. No cervical lymphadenopathy identified on this noncontrast exam. Visible bilateral cervical lymph nodes appear stable from the 2020 comparison and are subcentimeter, normal. A right suboccipital oval subcutaneous intermediate density soft tissue lesion measuring roughly 8 x 20 mm (series 5, image 28) was present in 2020 and  not significantly changed, probably chronic benign etiology such as scarring or hematoma. No other neck  mass or soft tissue asymmetry identified in the absence of IV contrast. Vascular: Vascular patency is not evaluated in the absence of IV contrast. No significant calcified atherosclerosis in the neck. Limited intracranial: Grossly stable since 2020 and negative for age. Visualized orbits: Postoperative changes to both globes, otherwise negative. Mastoids and visualized paranasal sinuses: Clear throughout. Skeleton: Bilateral TMJ degeneration. No acute dental finding. Advanced chronic cervical spine degeneration, but not significantly changed from 2020. Chronic interbody ankylosis C5-C6. Mild T1 superior endplate deformity is new since 2020 but more resembles a chronic Schmorl's node. Partially visible upper thoracic scoliosis. No destructive osseous lesion is identified. Upper chest: Round and mildly spiculated right upper lobe lung nodules/masses are 21 and 18 mm diameter respectively on series 4, images 102 and 117. Those levels not definitely imaged on the 2020 cervical spine CT. However, there is also partially visible right paratracheal mediastinal lymphadenopathy with a 2 cm short axis node on series 5 image 111. Visible axillary lymph nodes remain normal. Mild Calcified aortic atherosclerosis. IMPRESSION: No neck mass or lymphadenopathy identified in the noncontrast Neck. However, evidence of right upper lung and mediastinal Metastatic Disease in the visible Chest. Recommend restaging CT Chest, Abdomen, and Pelvis (oral and IV contrast preferred). Electronically Signed: By: Genevie Ann M.D. On: 11/21/2022 09:39    ASSESSMENT AND PLAN: This is a very pleasant 82 years old white male with Stage IIIb (T3, N2, M0) non-small cell lung cancer, squamous cell carcinoma presented with 2 separate right upper lobe lung nodule in addition to mediastinal lymphadenopathy diagnosed in January 2024. Patient had a recent MRI of the brain as well as a PET scan.  I personally and independently reviewed the imaging and discussed  the result with the patient and his fiance today. The patient has no evidence of metastatic disease to the brain or extrathoracic metastasis besides the 2 right upper lobe pulmonary nodule and mediastinal lymphadenopathy. I discussed his case with Dr. Roxan Hockey, cardiothoracic surgery and he indicated that because of the location of the lymph node, the patient will not be a great candidate for surgical resection with negative margin. I recommended for the patient a course of concurrent chemoradiation with weekly carboplatin for AUC of 2 and paclitaxel 45 Mg/M2 for 6-7 weeks.  Hopefully we will finish this course of concurrent chemoradiation because his planned wedding first week of April 2024. I discussed with the patient the adverse effect of this treatment including but not limited to mild alopecia, myelosuppression, nausea and vomiting, peripheral neuropathy, liver or renal dysfunction. After completion of the course of concurrent chemoradiation, I would consider The patient for consolidation treatment with immunotherapy if he has no evidence for disease progression after the induction phase. The patient is interested in proceeding with this treatment as planned. I will refer him to radiation oncology.  I expect him to start the first dose of his treatment on December 25, 2022. He will have a chemotherapy education class before the first dose of his treatment. I will call his pharmacy with prescription for Compazine 10 mg p.o. every 6 hours as needed for nausea. The patient will come back for follow-up visit in 2 weeks for evaluation and management of any adverse effect of his treatment. He and his fiance had several questions and I answered them completely to their satisfactions. The patient was advised to call immediately if he has any concerning symptoms in the interval.  The patient voices understanding of current disease status and treatment options and is in agreement with the current care  plan.  All questions were answered. The patient knows to call the clinic with any problems, questions or concerns. We can certainly see the patient much sooner if necessary.  The total time spent in the appointment was 55 minutes.  Disclaimer: This note was dictated with voice recognition software. Similar sounding words can inadvertently be transcribed and may not be corrected upon review.

## 2022-12-19 ENCOUNTER — Other Ambulatory Visit: Payer: Self-pay

## 2022-12-19 ENCOUNTER — Encounter: Payer: Self-pay | Admitting: Radiation Oncology

## 2022-12-19 ENCOUNTER — Ambulatory Visit
Admission: RE | Admit: 2022-12-19 | Discharge: 2022-12-19 | Disposition: A | Discharge status: Home / Self Care | Payer: Medicare Other | Source: Ambulatory Visit | Attending: Radiation Oncology | Admitting: Radiation Oncology

## 2022-12-19 VITALS — BP 142/90 | HR 83 | Temp 97.1°F | Resp 18 | Ht 65.5 in | Wt 147.4 lb

## 2022-12-19 DIAGNOSIS — K8020 Calculus of gallbladder without cholecystitis without obstruction: Secondary | ICD-10-CM | POA: Insufficient documentation

## 2022-12-19 DIAGNOSIS — C3411 Malignant neoplasm of upper lobe, right bronchus or lung: Secondary | ICD-10-CM

## 2022-12-19 DIAGNOSIS — E785 Hyperlipidemia, unspecified: Secondary | ICD-10-CM | POA: Insufficient documentation

## 2022-12-19 DIAGNOSIS — Z79899 Other long term (current) drug therapy: Secondary | ICD-10-CM | POA: Diagnosis not present

## 2022-12-19 DIAGNOSIS — N400 Enlarged prostate without lower urinary tract symptoms: Secondary | ICD-10-CM | POA: Insufficient documentation

## 2022-12-19 DIAGNOSIS — Z85528 Personal history of other malignant neoplasm of kidney: Secondary | ICD-10-CM | POA: Diagnosis not present

## 2022-12-19 DIAGNOSIS — E041 Nontoxic single thyroid nodule: Secondary | ICD-10-CM | POA: Diagnosis not present

## 2022-12-19 DIAGNOSIS — Z87891 Personal history of nicotine dependence: Secondary | ICD-10-CM | POA: Insufficient documentation

## 2022-12-19 DIAGNOSIS — K5730 Diverticulosis of large intestine without perforation or abscess without bleeding: Secondary | ICD-10-CM | POA: Diagnosis not present

## 2022-12-19 DIAGNOSIS — I700 Atherosclerosis of aorta: Secondary | ICD-10-CM | POA: Insufficient documentation

## 2022-12-19 DIAGNOSIS — C3491 Malignant neoplasm of unspecified part of right bronchus or lung: Secondary | ICD-10-CM

## 2022-12-19 DIAGNOSIS — Z809 Family history of malignant neoplasm, unspecified: Secondary | ICD-10-CM | POA: Diagnosis not present

## 2022-12-19 DIAGNOSIS — C641 Malignant neoplasm of right kidney, except renal pelvis: Secondary | ICD-10-CM

## 2022-12-19 NOTE — Progress Notes (Signed)
Radiation Oncology         [phone] ________________________________  Name: Alan Mendez        MRN: [id number]  Date of Service: 12/19/2022 DOB: 24-Dec-1940  JJ:OACZYSA, [name], MD  Curt Bears, MD     REFERRING PHYSICIAN: Curt Bears, MD   DIAGNOSIS: The primary encounter diagnosis was Malignant neoplasm of right upper lobe of lung ([place]). Diagnoses of Primary squamous cell carcinoma of right lung (HCC) and Urothelial carcinoma of kidney, right [facility]) were also pertinent to this visit.   HISTORY OF PRESENT ILLNESS: Alan Mendez is a 82 y.o. male seen at the request of Dr. Julien Nordmann for a newly diagnosed lung cancer. The patient has a history of urothelial cancer involving the right kidney.  He was treated with nephro ureterectomy in January 2018 and has been followed.  He has had local disease in the bladder but has been treated surgically with Dr. Louis Meckel.  He noted a palpable mass in his neck and given his history of cancer but underwent a CT of the neck on 11/21/2022.  A thyroid nodule was seen and felt to be stable in comparison to prior imaging.  An intermediate density soft tissue lesion in the right suboccipital region was also seen and present in prior imaging and read as stable.  Incidentally however there was a nodule in the right upper lung and lymph node concerning for possible mediastinal metastasis. He then underwent a CT chest abdomen and pelvis on 11/22/2022 that showed 2 right upper lobe pulmonary nodules, both measuring 2 cm in greatest dimension with right paratracheal lymphadenopathy measuring 2.1 cm.  No evidence of metastatic disease was identified.  He underwent a bronchoscopy on 12/07/2022 and cytology showed non-small cell lung cancer favoring squamous morphology in the right lung, his lymph node specimen was negative but he underwent an MRI on 12/12/2022 that was negative for metastatic disease, his PET scan on 12/14/2022 showed hypermetabolic activity in the 2 lung  nodules in the right upper lobe, and the mediastinal lymph node station with an SUV of 5.8.  It was favored to be involved, he is met with Dr. Julien Nordmann who is recommended chemoradiation.  Dr. Roxan Hockey has also reviewed his case and does not recommend surgical intervention at this time.  He is seen today to discuss chemoradiation.  PREVIOUS RADIATION THERAPY: No   PAST MEDICAL HISTORY:  Past Medical History:  Diagnosis Date   Acquired solitary kidney    left --  s/p  right nephroureterctomy 01/ 2018   Anxiety    no current problems   Depression    no current problems   Elevated PSA    Enlarged lymph node 11/2022   enlarged right paratracheal lymph node.   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, negative margins)   History of kidney stones    passed stones and also surgery to remove   History of unilateral nephrectomy    01/ 2018  right nephroureterectomy for renal pelvis mass (carcinoma in situ)   HLD (hyperlipidemia)    on crestor   Hyperplasia of prostate without lower urinary tract symptoms (LUTS)    Lung cancer ([place]) 12/07/2022   Nephrolithiasis    bilateral non-obstructive   Pneumonia    x 1 -  yrs ago   Pulmonary nodules 11/2022   2 right upper lobe pulmonary nodules   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HCC)    Renal cyst, acquired, right    right kidney removed   Wears glasses        PAST SURGICAL HISTORY: Past Surgical History:  Procedure Laterality Date   APPENDECTOMY  child   BRONCHIAL BIOPSY  12/07/2022   Procedure: BRONCHIAL BIOPSIES;  Surgeon: Garner Nash, DO;  Location: [facility];  Service: Pulmonary;;   BRONCHIAL BRUSHINGS  12/07/2022   Procedure: BRONCHIAL BRUSHINGS;  Surgeon: Garner Nash, DO;  Location: [facility];   Service: Pulmonary;;   BRONCHIAL NEEDLE ASPIRATION BIOPSY  12/07/2022   Procedure: BRONCHIAL NEEDLE ASPIRATION BIOPSIES;  Surgeon: Garner Nash, DO;  Location: [place];  Service: Pulmonary;;   CATARACT EXTRACTION W/ INTRAOCULAR LENS  IMPLANT, BILATERAL  2017   COLONOSCOPY  08/15/2012   Tubular Adenoma, No high grade dysplasia or malignacy.   CYSTOSCOPY W/ RETROGRADES Right 09/25/2016   Procedure: CYSTOSCOPY, URETHRAL DILITATION  WITH RETROGRADE PYELOGRAM, BRUSH BIOPSIES OF RIGHT RENAL MASS;  Surgeon: Carolan Clines, MD;  Location: [facility];  Service: Urology;  Laterality: Right;   CYSTOSCOPY W/ RETROGRADES Left 03/16/2017   Procedure: CYSTOSCOPY WITH RETROGRADE PYELOGRAM;  Surgeon: Ardis Hughs, MD;  Location: [facility];  Service: Urology;  Laterality: Left;   CYSTOSCOPY W/ URETERAL STENT REMOVAL Right 09/25/2016   Procedure: CYSTOSCOPY WITH STENT REMOVAL;  Surgeon: Carolan Clines, MD;  Location: [place];  Service: Urology;  Laterality: Right;   CYSTOSCOPY WITH RETROGRADE PYELOGRAM, URETEROSCOPY AND STENT PLACEMENT Right 08/21/2016   Procedure: CYSTOSCOPY WITH RIGHT RETROGRADE PYELOGRAM, RIGHT FLEXIBLE AND RIGID URETEROSCOPY, INSERTION DOUBLE J STENT RIGHT;  Surgeon: Carolan Clines, MD;  Location: [place];  Service: Urology;  Laterality: Right;   EXTRACORPOREAL SHOCK WAVE LITHOTRIPSY  2009   KNEE SURGERY Right 1980's   ROBOT ASSITED LAPAROSCOPIC NEPHROURETERECTOMY Right 11/15/2016   Procedure: RIGHT XI ROBOT ASSITED LAPAROSCOPIC NEPHROURETERECTOMY;  Surgeon: Ardis Hughs, MD;  Location: [facility];  Service: Urology;  Laterality: Right;   TRANSURETHRAL RESECTION OF BLADDER TUMOR WITH MITOMYCIN-C Left 03/16/2017   Procedure: CYSTOSCOPY BIOPSIES OF BLADDER TUMOR WITH FULGURATION;  Surgeon: Ardis Hughs, MD;  Location: [facility];  Service: Urology;  Laterality: Left;   URETEROSCOPY  Right 09/25/2016   Procedure: RIGHT URETEROSCOPY;  Surgeon: Carolan Clines, MD;  Location: [name];  Service: Urology;  Laterality: Right;   VIDEO BRONCHOSCOPY WITH ENDOBRONCHIAL ULTRASOUND Right 12/07/2022   Procedure: VIDEO BRONCHOSCOPY WITH ENDOBRONCHIAL ULTRASOUND;  Surgeon: Garner Nash, DO;  Location: [place];  Service: Pulmonary;  Laterality: Right;     FAMILY HISTORY:  Family History  Problem Relation Age of Onset   Heart disease Mother    Cancer Father    Cancer Paternal Grandmother    Heart disease Paternal Grandfather      SOCIAL HISTORY:  reports that he quit smoking about 37 years ago. His smoking use included cigarettes. He has a 30.00 pack-year smoking history. He has never used smokeless tobacco. He reports current alcohol use of about 3.0 standard drinks of alcohol per week. He reports that he does not use drugs. The patient is engaged and planning a wedding on 02/17/23 in [place], [place]. 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 He enjoys cycling and reading.    ALLERGIES: Lamisil [terbinafine]   MEDICATIONS:  Current Outpatient Medications  Medication Sig Dispense Refill   acetaminophen (TYLENOL) 500 MG tablet Take 1,000 mg by mouth every 6 (six) hours as needed for headache.     azithromycin (ZITHROMAX) 250 MG tablet Take two today and then one daily until finished. 6 tablet 0   cholecalciferol (VITAMIN D3) 25 MCG (1000 UNIT) tablet Take 1,000 Units by mouth daily.     CRANBERRY PO Take 650 mg by mouth daily.     Multiple Vitamins-Minerals (MULTIVITAMIN WITH MINERALS) tablet Take 1 tablet by mouth daily. Centrum     prochlorperazine (COMPAZINE) 10 MG tablet Take 1 tablet (10 mg total) by mouth every 6 (six) hours as needed for nausea or vomiting. 30 tablet 0   rosuvastatin (CRESTOR) 10 MG tablet TAKE 1 TABLET EACH DAY. (Patient taking differently: Take 20 mg by mouth daily.) 30 tablet 0    tamsulosin (FLOMAX) 0.4 MG CAPS capsule Take 1 capsule (0.4 mg total) by mouth daily. (Patient taking differently: Take 0.4 mg by mouth daily after breakfast.) 30 capsule 5   No current facility-administered medications for this encounter.     REVIEW OF SYSTEMS: On review of systems, the patient reports that he is doing pretty well overall.  He states that he still is able to cycle which is something he enjoys.  He is very active and still consults for work.  He is hoping to feel well enough to enjoy his upcoming wedding in April.  He denies any unintended weight changes, chest pain, shortness of breath, or hemoptysis.     PHYSICAL EXAM:  Wt Readings from Last 3 Encounters:  12/19/22 147 lb 6 oz (66.8 kg)  12/18/22 146 lb 8 oz (66.5 kg)  12/07/22 146 lb (66.2 kg)   Temp Readings from Last 3 Encounters:  12/19/22 (!) 97.1 F (36.2 C) (Temporal)  12/18/22 98.1 F (36.7 C) (Oral)  12/07/22 97.8 F (36.6 C)   BP Readings from Last 3 Encounters:  12/19/22 (!) 142/90  12/18/22 (!) 147/72  12/07/22 (!) 149/85   Pulse Readings from Last 3 Encounters:  12/19/22 83  12/18/22 80  12/07/22 65   Pain Assessment Pain Score: 0-No pain/10  In general this is a well appearing caucasian male who appears younger than his stated age in no acute distress. He's alert and oriented x4 and appropriate throughout the examination. Cardiopulmonary assessment is negative for acute distress and he exhibits normal effort.     ECOG = 0  0 - Asymptomatic (Fully active, able to carry on all predisease activities without restriction)  1 - Symptomatic but completely ambulatory (Restricted in physically strenuous activity but ambulatory and able to carry out work of a light or sedentary nature. For example, light housework, office work)  2 - Symptomatic, <50% in bed during the day (Ambulatory and capable of all self care but unable to carry out any work activities. Up and about more than 50% of waking  hours)  3 - Symptomatic, >50% in bed, but not bedbound (Capable of only limited self-care, confined to bed or chair 50% or more of waking hours)  4 - Bedbound (Completely disabled. Cannot carry on any self-care. Totally confined to bed or chair)  5 - Death   Eustace Pen MM, Creech RH, Tormey DC, et al. [phone]). "Toxicity and response criteria of the [facility]". [place] Oncol. 5 (6): 649-55    LABORATORY DATA:  Lab Results  Component Value Date   WBC 6.4 11/30/2022   HGB  14.8 11/30/2022   HCT 43.5 11/30/2022   MCV 85.1 11/30/2022   PLT 264 11/30/2022   Lab Results  Component Value Date   NA 140 11/30/2022   K 3.9 11/30/2022   CL 107 11/30/2022   CO2 26 11/30/2022   Lab Results  Component Value Date   ALT 17 11/30/2022   AST 22 11/30/2022   ALKPHOS 65 11/30/2022   BILITOT 0.7 11/30/2022      RADIOGRAPHY: NM PET Image Initial (PI) Skull Base To Thigh (F-18 FDG)  Result Date: 12/15/2022 CLINICAL DATA:  Initial treatment strategy for lung cancer. Bronchoscopy 12/07/2022 documented non-small-cell carcinoma in a right upper lobe pulmonary lesion. Patient with personal history of urothelial malignancy status post right nephro ureterectomy and history of bladder cancer. EXAM: NUCLEAR MEDICINE PET SKULL BASE TO THIGH TECHNIQUE: 7.3 mCi F-18 FDG was injected intravenously. Full-ring PET imaging was performed from the skull base to thigh after the radiotracer. CT data was obtained and used for attenuation correction and anatomic localization. Fasting blood glucose: 90 mg/dl COMPARISON:  Chest, abdomen and pelvis CT 11/22/2022 FINDINGS: Mediastinal blood pool activity: SUV max 2.6 Liver activity: SUV max NA NECK: No hypermetabolic lymph nodes in the neck. Incidental CT findings: None. CHEST: 13 mm left thyroid nodule is hypermetabolic with SUV max = 16.1 19 mm nodule medial right upper lobe on 55/4 is markedly hypermetabolic with SUV max = 9.3. A second paraspinal right  upper lobe nodule is identified slightly more inferiorly measuring 18 mm on 62/4. This is also hypermetabolic with SUV max = 09.6. 21 mm short axis retrotracheal node on 04/5 is hypermetabolic with SUV max = 5.8. No hypermetabolic axillary or left hilar lymphadenopathy. No additional hypermetabolic pulmonary nodule or mass lesion evident. Incidental CT findings: There is mild atherosclerotic calcification of the abdominal aorta without aneurysm. Coronary artery calcification is evident. No pulmonary edema or pleural effusion. ABDOMEN/PELVIS: No abnormal hypermetabolic activity within the liver, pancreas, adrenal glands, or spleen. No hypermetabolic lymph nodes in the abdomen or pelvis. Incidental CT findings: 8 mm low-density lesion in the medial segment left liver shows no hypermetabolism, compatible with a cyst. No followup imaging is recommended. 13 mm calcified gallstone evident. Right kidney surgically absent. 12 mm water is the lesion upper pole left kidney is compatible with a cyst. No followup imaging is recommended. There is mild atherosclerotic calcification of the abdominal aorta without aneurysm. Diverticular disease noted left colon without diverticulitis. Prostate gland is enlarged. SKELETON: No focal hypermetabolic activity to suggest skeletal metastasis. Incidental CT findings: Thoracolumbar scoliosis evident. IMPRESSION: 1. Two hypermetabolic right upper lobe pulmonary nodules, consistent with neoplasm. 2. Hypermetabolic retrotracheal lymph node, consistent with metastatic disease. 3. Hypermetabolic 13 mm left thyroid nodule. Recommend thyroid US and biopsy. (Ref: J Am Coll Radiol. 2015 Feb;12(2): 143-50). 4. No evidence for hypermetabolic metastatic disease in the abdomen or pelvis. 5. Status post right nephroureterectomy. 6. Cholelithiasis. 7. Left colonic diverticulosis without diverticulitis. 8. Prostatomegaly. 9.  Aortic Atherosclerosis (ICD10-I70.0). Electronically Signed   By: Misty Stanley  M.D.   On: 12/15/2022 10:20   MR BRAIN W WO CONTRAST  Result Date: 12/12/2022 CLINICAL DATA:  Non-small cell lung cancer, staging EXAM: MRI HEAD WITHOUT AND WITH CONTRAST TECHNIQUE: Multiplanar, multiecho pulse sequences of the brain and surrounding structures were obtained without and with intravenous contrast. CONTRAST:  18mL GADAVIST GADOBUTROL 1 MMOL/ML IV SOLN COMPARISON:  No prior MRI available FINDINGS: Brain: No restricted diffusion to suggest acute or subacute infarct. No abnormal parenchymal or  meningeal enhancement. No acute hemorrhage, mass, mass effect, or midline shift. No hydrocephalus or extra-axial collection. Normal pituitary and craniocervical junction. No hemosiderin deposition to suggest remote hemorrhage. Scattered T2 hyperintense signal in the periventricular white matter, likely the sequela of minimal chronic small vessel ischemic disease. Vascular: Patent arterial flow voids. Normal arterial and venous enhancement. Skull and upper cervical spine: 3 mm enhancing focus in the head of the left mandible (series 16, image 40). Otherwise normal marrow signal. T2 hyperintense lesion in the right suboccipital soft tissues (series 9, image 4), which is T1 hypointense with minimal focal enhancement, measuring up to 1.2 x 1.1 x 1.3 cm (AP x TR x CC) (series 16, image 23 and series 17, image 7). Sinuses/Orbits: Clear paranasal sinuses. No acute finding in the orbits. Status post bilateral lens replacements. Other: The mastoid air cells are well aerated. IMPRESSION: 1. No acute intracranial process. No evidence of metastatic disease in the brain. 2. 3 mm enhancing focus in the head of the left mandible, which is indeterminate but concerning for a small focus of metastatic disease given the patient's history. 3. T2 hyperintense lesion in the right suboccipital soft tissues, which is T1 hypointense with minimal focal enhancement, measuring up to 1.3 cm, which is also indeterminate. Electronically Signed    By: Merilyn Baba M.D.   On: 12/12/2022 17:18   DG CHEST PORT 1 VIEW  Result Date: 12/07/2022 CLINICAL DATA:  Post bronchoscopy EXAM: PORTABLE CHEST 1 VIEW COMPARISON:  Portable exam 1046 hours compared to CT exam of 11/22/2022 FINDINGS: Normal heart size, mediastinal contours, and pulmonary vascularity. Atherosclerotic calcification aorta. Linear subsegmental atelectasis medial LEFT lower lobe. RIGHT lung nodules/masses identified on prior CT are poorly demonstrated on current radiographic exam. No acute infiltrate, pleural effusion, or pneumothorax. Bones demineralized with biconvex scoliosis. IMPRESSION: No pneumothorax. Streaky atelectasis medial LEFT lower lobe. Previously identified RIGHT lung nodules/masses poorly visualized. Electronically Signed   By: Lavonia Dana M.D.   On: 12/07/2022 11:00   DG C-ARM BRONCHOSCOPY  Result Date: 12/07/2022 C-ARM BRONCHOSCOPY: Fluoroscopy was utilized by the requesting physician.  No radiographic interpretation.   CT CHEST ABDOMEN PELVIS W CONTRAST  Result Date: 11/23/2022 CLINICAL DATA:  Urothelial malignancy with right nephroureterectomy in 2018. Prior bladder cancer. Recent neck CT revealed evidence of active malignancy in the chest. * Tracking Code: BO * EXAM: CT CHEST, ABDOMEN, AND PELVIS WITH CONTRAST TECHNIQUE: Multidetector CT imaging of the chest, abdomen and pelvis was performed following the standard protocol during bolus administration of intravenous contrast. RADIATION DOSE REDUCTION: This exam was performed according to the departmental dose-optimization program which includes automated exposure control, adjustment of the mA and/or kV according to patient size and/or use of iterative reconstruction technique. CONTRAST:  27mL OMNIPAQUE IOHEXOL 300 MG/ML  SOLN COMPARISON:  Multiple exams, including 03/23/2020 and recent neck CT from 11/21/2022 FINDINGS: CT CHEST FINDINGS Cardiovascular: Mild thoracic aortic atherosclerotic vascular disease.  Mediastinum/Nodes: Pathologic right paratracheal node 2.1 cm in short axis on image 18 series 2. Lungs/Pleura: Right upper lobe nodule, 2.0 by 1.6 cm on image 35 series 4. A separate right upper lobe nodule with somewhat irregular margins measures 2.0 by 1.7 cm on image 52 of series 4, and may have subtle internal air bronchograms or cavitation. Musculoskeletal: Dextroconvex upper thoracic and levoconvex lower thoracic scoliosis with rotary component. No compelling findings of osseous metastatic disease. CT ABDOMEN PELVIS FINDINGS Hepatobiliary: A sharply defined fluid density lesion in segment 4a of the liver measures 1.1 by 1.0 cm  on image 49 of series 2 and is likely a cyst. This measured 0.8 cm in diameter on 03/23/2020. 1.3 cm gallstone noted in the gallbladder. Pancreas: Unremarkable Spleen: Unremarkable Adrenals/Urinary Tract: Both adrenal glands appear normal. Right nephroureterectomy. The prostate gland indents the bladder base but no bladder mass is currently identified. Fluid density left kidney upper pole cyst appears benign and similar to the 2021 exam. No abnormal enhancement is identified along the remaining urothelium. Stomach/Bowel: Sigmoid colon diverticulosis. No findings of active diverticulitis. Vascular/Lymphatic: Atherosclerosis is present, including aortoiliac atherosclerotic disease. No pathologic adenopathy in the abdomen or pelvis. Reproductive: Notable prostatomegaly. Other: No supplemental non-categorized findings. Musculoskeletal: Chronic lucent lesion of the left iliac bone measuring 1.8 by 1.0 cm, with thin sclerotic margin and no substantial change from 2021, benign. Levoconvex thoracolumbar scoliosis. IMPRESSION: 1. Two right upper lobe pulmonary nodules, the larger of which measures 2.0 by 1.6 cm and the smaller 2.0 by 1.7 cm. There is also a pathologically enlarged right paratracheal lymph node measuring 2.1 cm in short axis. The appearance favors malignancy over atypical  infection with reactive adenopathy. Tissue diagnosis is likely indicated. 2. No findings of metastatic disease to the abdomen/pelvis or osseous structures. 3. Aortic atherosclerosis. 4. Sigmoid colon diverticulosis. 5. Prior right nephroureterectomy. 6. Prostatomegaly. 7. Cholelithiasis. Aortic Atherosclerosis (ICD10-I70.0). Electronically Signed   By: Van Clines M.D.   On: 11/23/2022 16:12   CT SOFT TISSUE NECK WO CONTRAST  Addendum Date: 11/21/2022   ADDENDUM REPORT: 11/21/2022 09:54 ADDENDUM: Study discussed by telephone with Dr. [name]. [name] (covering for provider STEPHANIE EDWARDS) on 11/21/2022 at 0945 hours. Electronically Signed   By: Genevie Ann M.D.   On: 11/21/2022 09:54   Result Date: 11/21/2022 CLINICAL DATA:  82 year old male with left neck mass discovered 3 days ago. Remote history of renal or urothelial carcinoma status post right nephrectomy in 2018. EXAM: CT NECK WITHOUT CONTRAST TECHNIQUE: Multidetector CT imaging of the neck was performed following the standard protocol without intravenous contrast. RADIATION DOSE REDUCTION: This exam was performed according to the departmental dose-optimization program which includes automated exposure control, adjustment of the mA and/or kV according to patient size and/or use of iterative reconstruction technique. COMPARISON:  Cervical spine CT 03/15/2019. FINDINGS: Pharynx and larynx: Noncontrast larynx, pharynx, parapharyngeal, and retropharyngeal spaces appear stable and within normal limits. Salivary glands: Noncontrast parotid and submandibular glands appear symmetric and within normal limits. Negative noncontrast sublingual space. Thyroid: Round 15 mm hypodense left thyroid nodule has not significantly changed since 2020 and significance is doubtful. In the setting of significant comorbidities or limited life expectancy, no follow-up recommended (ref: J Am Coll Radiol. 2015 Feb;12(2): 143-50). Lymph nodes: No marked area of clinical concern.  No cervical lymphadenopathy identified on this noncontrast exam. Visible bilateral cervical lymph nodes appear stable from the 2020 comparison and are subcentimeter, normal. A right suboccipital oval subcutaneous intermediate density soft tissue lesion measuring roughly 8 x 20 mm (series 5, image 28) was present in 2020 and not significantly changed, probably chronic benign etiology such as scarring or hematoma. No other neck mass or soft tissue asymmetry identified in the absence of IV contrast. Vascular: Vascular patency is not evaluated in the absence of IV contrast. No significant calcified atherosclerosis in the neck. Limited intracranial: Grossly stable since 2020 and negative for age. Visualized orbits: Postoperative changes to both globes, otherwise negative. Mastoids and visualized paranasal sinuses: Clear throughout. Skeleton: Bilateral TMJ degeneration. No acute dental finding. Advanced chronic cervical spine degeneration, but not significantly  changed from 2020. Chronic interbody ankylosis C5-C6. Mild T1 superior endplate deformity is new since 2020 but more resembles a chronic Schmorl's node. Partially visible upper thoracic scoliosis. No destructive osseous lesion is identified. Upper chest: Round and mildly spiculated right upper lobe lung nodules/masses are 21 and 18 mm diameter respectively on series 4, images 102 and 117. Those levels not definitely imaged on the 2020 cervical spine CT. However, there is also partially visible right paratracheal mediastinal lymphadenopathy with a 2 cm short axis node on series 5 image 111. Visible axillary lymph nodes remain normal. Mild Calcified aortic atherosclerosis. IMPRESSION: No neck mass or lymphadenopathy identified in the noncontrast Neck. However, evidence of right upper lung and mediastinal Metastatic Disease in the visible Chest. Recommend restaging CT Chest, Abdomen, and Pelvis (oral and IV contrast preferred). Electronically Signed: By: Genevie Ann M.D.  On: 11/21/2022 09:39       IMPRESSION/PLAN: 1. Stage IIIB, cT3N2M0, NSCLC, squamous cell carcinoma of the RUL. Dr. Lisbeth Renshaw discusses the pathology findings and reviews the nature of locally advanced lung cancer. We reviewed the discussion from thoracic oncology conference and the rationale for chemoradiation. We discussed the risks, benefits, short, and long term effects of radiotherapy, as well as the curative intent, and the patient is interested in proceeding. Dr. Lisbeth Renshaw discusses the delivery and logistics of radiotherapy and anticipates a course of 6 1/2 weeks of radiotherapy to the RUL and regional nodes of the mediastinum. Written consent is obtained and placed in the chart, a copy was provided to the patient. He will simulate tomorrow and begin radiation on 12/25/22.   In a visit lasting 60 minutes, greater than 50% of the time was spent face to face discussing the patient's condition, in preparation for the discussion, and coordinating the patient's care.   The above documentation reflects my direct findings during this shared patient visit. Please see the separate note by Dr. Lisbeth Renshaw on this date for the remainder of the patient's plan of care.    Carola Rhine, [facility]   **Disclaimer: This note was dictated with voice recognition software. Similar sounding words can inadvertently be transcribed and this note may contain transcription errors which may not have been corrected upon publication of note.**

## 2022-12-20 ENCOUNTER — Encounter (HOSPITAL_COMMUNITY): Payer: Self-pay

## 2022-12-20 ENCOUNTER — Ambulatory Visit
Admission: RE | Admit: 2022-12-20 | Discharge: 2022-12-20 | Disposition: A | Discharge status: Home / Self Care | Payer: Medicare Other | Source: Ambulatory Visit | Attending: Radiation Oncology | Admitting: Radiation Oncology

## 2022-12-20 ENCOUNTER — Other Ambulatory Visit: Payer: Self-pay

## 2022-12-20 ENCOUNTER — Telehealth: Payer: Self-pay | Admitting: Internal Medicine

## 2022-12-20 DIAGNOSIS — Z510 Encounter for antineoplastic radiation therapy: Secondary | ICD-10-CM | POA: Diagnosis not present

## 2022-12-20 DIAGNOSIS — Z5111 Encounter for antineoplastic chemotherapy: Secondary | ICD-10-CM | POA: Diagnosis not present

## 2022-12-20 DIAGNOSIS — C3411 Malignant neoplasm of upper lobe, right bronchus or lung: Secondary | ICD-10-CM | POA: Insufficient documentation

## 2022-12-20 NOTE — Telephone Encounter (Signed)
Called patient regarding upcoming appointments, patient is notified. 

## 2022-12-21 ENCOUNTER — Inpatient Hospital Stay: Payer: Medicare Other

## 2022-12-22 ENCOUNTER — Other Ambulatory Visit: Payer: Self-pay

## 2022-12-22 ENCOUNTER — Encounter: Payer: Self-pay | Admitting: Acute Care

## 2022-12-22 ENCOUNTER — Ambulatory Visit (INDEPENDENT_AMBULATORY_CARE_PROVIDER_SITE_OTHER): Payer: Medicare Other | Admitting: Acute Care

## 2022-12-22 VITALS — BP 132/82 | HR 48 | Temp 98.4°F | Ht 65.0 in | Wt 147.8 lb

## 2022-12-22 DIAGNOSIS — Z87891 Personal history of nicotine dependence: Secondary | ICD-10-CM

## 2022-12-22 DIAGNOSIS — C3491 Malignant neoplasm of unspecified part of right bronchus or lung: Secondary | ICD-10-CM

## 2022-12-22 DIAGNOSIS — C3411 Malignant neoplasm of upper lobe, right bronchus or lung: Secondary | ICD-10-CM | POA: Diagnosis not present

## 2022-12-22 DIAGNOSIS — Z510 Encounter for antineoplastic radiation therapy: Secondary | ICD-10-CM | POA: Diagnosis not present

## 2022-12-22 DIAGNOSIS — Z5111 Encounter for antineoplastic chemotherapy: Secondary | ICD-10-CM | POA: Diagnosis not present

## 2022-12-22 MED FILL — Dexamethasone Sodium Phosphate Inj 100 MG/10ML: INTRAMUSCULAR | Qty: 1 | Status: AC

## 2022-12-22 NOTE — Patient Instructions (Addendum)
It is good to see you today. I am glad you are doing well after your procedure. Your lung biopsies positive for Malignant cells consistent with non-small cell carcinoma The number 4 L lymph node showed  No malignant cells identified. You have appointments scheduled with medical oncology and radiation oncology The oncology team will take great care of you.  Follow up as needed.  Please contact office for sooner follow up if symptoms do not improve or worsen or seek emergency care

## 2022-12-22 NOTE — Progress Notes (Signed)
The proposed treatment discussed in conference is for discussion purpose only and is not a binding recommendation.  The patients have not been physically examined, or presented with their treatment options.  Therefore, final treatment plans cannot be decided.  

## 2022-12-22 NOTE — Progress Notes (Signed)
History of Present Illness Alan Mendez is a 82 y.o. male  former smoker ( quit 1987 with a 30 pack year smoking history) with Stage IIIb (T3, N2, M0) non-small cell lung cancer, squamous cell carcinoma presented with 2 separate right upper lobe lung nodule in addition to mediastinal lymphadenopathy diagnosed in January 2024 after Flexible video fiberoptic bronchoscopy with robotic assistance and biopsies by Dr. Valeta Harms.   Synopsis The patient has a history of urothelial cancer involving the right kidney.  He was treated with nephro ureterectomy in January 2018 and has been followed.  He has had local disease in the bladder but has been treated surgically with Dr. Louis Meckel.  He noted a palpable mass in his neck and given his history of cancer but underwent a CT of the neck on 11/21/2022.  A thyroid nodule was seen and felt to be stable in comparison to prior imaging.  An intermediate density soft tissue lesion in the right suboccipital region was also seen and present in prior imaging and read as stable.  Incidentally however there was a nodule in the right upper lung and lymph node concerning for possible mediastinal metastasis. He then underwent a CT chest abdomen and pelvis on 11/22/2022 that showed 2 right upper lobe pulmonary nodules, both measuring 2 cm in greatest dimension with right paratracheal lymphadenopathy measuring 2.1 cm.  No evidence of metastatic disease was identified.  He underwent a bronchoscopy on 12/07/2022 and cytology showed non-small cell lung cancer favoring squamous morphology in the right lung, his lymph node specimen was negative but he underwent an MRI on 12/12/2022 that was negative for metastatic disease, his PET scan on 12/14/2022 showed hypermetabolic activity in the 2 lung nodules in the right upper lobe, and the mediastinal lymph node station with an SUV of 5.8.  It was favored to be involved, he is met with Dr. Julien Nordmann who is recommended chemoradiation.  Dr. Roxan Hockey has also  reviewed his case and does not recommend surgical intervention at this time.      12/22/2022 Pt. Presents for follow up after bronch 12/07/2022.He underwent Flexible video fiberoptic bronchoscopy with robotic assistance and biopsies by Dr. Valeta Harms on 12/07/2022.  Pt. Has had no complications post bronch.No coughing up blood, no sore throat. He returned to baseline immediately post procedure. His biopsies were positive for Malignant cells consistent with non-small cell carcinoma>> squamous cell. He was  seen by Dr. Julien Nordmann 12/18/2022. Pt. Is engaged to get married in April, and he would like to have his treatment completed by then. A course of concurrent chemoradiation with weekly carboplatin for AUC of 2 and paclitaxel 45 Mg/M2. First dose expected December 25, 2022. Pt. Has also been seen by radiation oncology on 12/20/2022. ( Dr. Lisbeth Renshaw).Plan is for   radiotherapy anticipating  a course of 6 1/2 weeks of radiotherapy to the RUL and regional nodes of the mediastinum. First treatment is planned for 12/25/2022. He had simulation on 12/20/2022.   Test Results:  CYTOLOGY - NON PAP  THIS IS AN ADDENDUM REPORT * CASE: [id number] PATIENT: Alan Mendez Non-Gynecological Cytology Report Addendum *  Reason for Addendum #1:  Immunohistochemistry results  Clinical History: Lung Nodule, Adenopathy    FINAL MICROSCOPIC DIAGNOSIS: A. LUNG, RUL TARGET 1, NEEDLE FINE ASPIRATION  BIOPSY: - Malignant cells consistent with non-small cell carcinoma - See comment  B. LUNG, RUL TARGET 1, BRUSHING: - Malignant cells consistent with non-small cell carcinoma  4L lymph node >> No malignant cells identified.  Latest Ref Rng & Units 11/30/2022    7:00 AM 03/14/2019   11:36 PM 03/16/2017   11:31 AM  CBC  WBC 4.0 - 10.5 K/uL 6.4  6.9    Hemoglobin 13.0 - 17.0 g/dL 14.8  15.0  15.1   Hematocrit 39.0 - 52.0 % 43.5  45.8    Platelets 150 - 400 K/uL 264  231            Latest Ref Rng & Units 11/30/2022    7:00 AM  03/14/2019   11:36 PM 03/16/2017   11:31 AM  CBC  WBC 4.0 - 10.5 K/uL 6.4  6.9    Hemoglobin 13.0 - 17.0 g/dL 14.8  15.0  15.1   Hematocrit 39.0 - 52.0 % 43.5  45.8    Platelets 150 - 400 K/uL 264  231         Latest Ref Rng & Units 11/30/2022    7:00 AM 11/22/2022    1:45 PM 03/14/2019   11:36 PM  BMP  Glucose 70 - 99 mg/dL 100   106   BUN 8 - 23 mg/dL 20   29   Creatinine 0.61 - 1.24 mg/dL 1.07  1.10  1.38   Sodium 135 - 145 mmol/L 140   140   Potassium 3.5 - 5.1 mmol/L 3.9   3.9   Chloride 98 - 111 mmol/L 107   108   CO2 22 - 32 mmol/L 26   21   Calcium 8.9 - 10.3 mg/dL 8.9   9.2     BNP No results found for: "BNP"  ProBNP No results found for: "PROBNP"  PFT No results found for: "FEV1PRE", "FEV1POST", "FVCPRE", "FVCPOST", "TLC", "DLCOUNC", "PREFEV1FVCRT", "PSTFEV1FVCRT"  NM PET Image Initial (PI) Skull Base To Thigh (F-18 FDG)  Result Date: 12/15/2022 CLINICAL DATA:  Initial treatment strategy for lung cancer. Bronchoscopy 12/07/2022 documented non-small-cell carcinoma in a right upper lobe pulmonary lesion. Patient with personal history of urothelial malignancy status post right nephro ureterectomy and history of bladder cancer. EXAM: NUCLEAR MEDICINE PET SKULL BASE TO THIGH TECHNIQUE: 7.3 mCi F-18 FDG was injected intravenously. Full-ring PET imaging was performed from the skull base to thigh after the radiotracer. CT data was obtained and used for attenuation correction and anatomic localization. Fasting blood glucose: 90 mg/dl COMPARISON:  Chest, abdomen and pelvis CT 11/22/2022 FINDINGS: Mediastinal blood pool activity: SUV max 2.6 Liver activity: SUV max NA NECK: No hypermetabolic lymph nodes in the neck. Incidental CT findings: None. CHEST: 13 mm left thyroid nodule is hypermetabolic with SUV max = 44.0 19 mm nodule medial right upper lobe on 55/4 is markedly hypermetabolic with SUV max = 9.3. A second paraspinal right upper lobe nodule is identified slightly more inferiorly  measuring 18 mm on 62/4. This is also hypermetabolic with SUV max = 34.7. 21 mm short axis retrotracheal node on 42/5 is hypermetabolic with SUV max = 5.8. No hypermetabolic axillary or left hilar lymphadenopathy. No additional hypermetabolic pulmonary nodule or mass lesion evident. Incidental CT findings: There is mild atherosclerotic calcification of the abdominal aorta without aneurysm. Coronary artery calcification is evident. No pulmonary edema or pleural effusion. ABDOMEN/PELVIS: No abnormal hypermetabolic activity within the liver, pancreas, adrenal glands, or spleen. No hypermetabolic lymph nodes in the abdomen or pelvis. Incidental CT findings: 8 mm low-density lesion in the medial segment left liver shows no hypermetabolism, compatible with a cyst. No followup imaging is recommended. 13 mm calcified gallstone evident. Right kidney surgically absent. 12 mm  water is the lesion upper pole left kidney is compatible with a cyst. No followup imaging is recommended. There is mild atherosclerotic calcification of the abdominal aorta without aneurysm. Diverticular disease noted left colon without diverticulitis. Prostate gland is enlarged. SKELETON: No focal hypermetabolic activity to suggest skeletal metastasis. Incidental CT findings: Thoracolumbar scoliosis evident. IMPRESSION: 1. Two hypermetabolic right upper lobe pulmonary nodules, consistent with neoplasm. 2. Hypermetabolic retrotracheal lymph node, consistent with metastatic disease. 3. Hypermetabolic 13 mm left thyroid nodule. Recommend thyroid US and biopsy. (Ref: J Am Coll Radiol. 2015 Feb;12(2): 143-50). 4. No evidence for hypermetabolic metastatic disease in the abdomen or pelvis. 5. Status post right nephroureterectomy. 6. Cholelithiasis. 7. Left colonic diverticulosis without diverticulitis. 8. Prostatomegaly. 9.  Aortic Atherosclerosis (ICD10-I70.0). Electronically Signed   By: Misty Stanley M.D.   On: 12/15/2022 10:20   MR BRAIN W WO  CONTRAST  Result Date: 12/12/2022 CLINICAL DATA:  Non-small cell lung cancer, staging EXAM: MRI HEAD WITHOUT AND WITH CONTRAST TECHNIQUE: Multiplanar, multiecho pulse sequences of the brain and surrounding structures were obtained without and with intravenous contrast. CONTRAST:  9mL GADAVIST GADOBUTROL 1 MMOL/ML IV SOLN COMPARISON:  No prior MRI available FINDINGS: Brain: No restricted diffusion to suggest acute or subacute infarct. No abnormal parenchymal or meningeal enhancement. No acute hemorrhage, mass, mass effect, or midline shift. No hydrocephalus or extra-axial collection. Normal pituitary and craniocervical junction. No hemosiderin deposition to suggest remote hemorrhage. Scattered T2 hyperintense signal in the periventricular white matter, likely the sequela of minimal chronic small vessel ischemic disease. Vascular: Patent arterial flow voids. Normal arterial and venous enhancement. Skull and upper cervical spine: 3 mm enhancing focus in the head of the left mandible (series 16, image 40). Otherwise normal marrow signal. T2 hyperintense lesion in the right suboccipital soft tissues (series 9, image 4), which is T1 hypointense with minimal focal enhancement, measuring up to 1.2 x 1.1 x 1.3 cm (AP x TR x CC) (series 16, image 23 and series 17, image 7). Sinuses/Orbits: Clear paranasal sinuses. No acute finding in the orbits. Status post bilateral lens replacements. Other: The mastoid air cells are well aerated. IMPRESSION: 1. No acute intracranial process. No evidence of metastatic disease in the brain. 2. 3 mm enhancing focus in the head of the left mandible, which is indeterminate but concerning for a small focus of metastatic disease given the patient's history. 3. T2 hyperintense lesion in the right suboccipital soft tissues, which is T1 hypointense with minimal focal enhancement, measuring up to 1.3 cm, which is also indeterminate. Electronically Signed   By: Merilyn Baba M.D.   On: 12/12/2022  17:18   DG CHEST PORT 1 VIEW  Result Date: 12/07/2022 CLINICAL DATA:  Post bronchoscopy EXAM: PORTABLE CHEST 1 VIEW COMPARISON:  Portable exam 1046 hours compared to CT exam of 11/22/2022 FINDINGS: Normal heart size, mediastinal contours, and pulmonary vascularity. Atherosclerotic calcification aorta. Linear subsegmental atelectasis medial LEFT lower lobe. RIGHT lung nodules/masses identified on prior CT are poorly demonstrated on current radiographic exam. No acute infiltrate, pleural effusion, or pneumothorax. Bones demineralized with biconvex scoliosis. IMPRESSION: No pneumothorax. Streaky atelectasis medial LEFT lower lobe. Previously identified RIGHT lung nodules/masses poorly visualized. Electronically Signed   By: Lavonia Dana M.D.   On: 12/07/2022 11:00   DG C-ARM BRONCHOSCOPY  Result Date: 12/07/2022 C-ARM BRONCHOSCOPY: Fluoroscopy was utilized by the requesting physician.  No radiographic interpretation.   CT CHEST ABDOMEN PELVIS W CONTRAST  Result Date: 11/23/2022 CLINICAL DATA:  Urothelial malignancy with right nephroureterectomy in  2018. Prior bladder cancer. Recent neck CT revealed evidence of active malignancy in the chest. * Tracking Code: BO * EXAM: CT CHEST, ABDOMEN, AND PELVIS WITH CONTRAST TECHNIQUE: Multidetector CT imaging of the chest, abdomen and pelvis was performed following the standard protocol during bolus administration of intravenous contrast. RADIATION DOSE REDUCTION: This exam was performed according to the departmental dose-optimization program which includes automated exposure control, adjustment of the mA and/or kV according to patient size and/or use of iterative reconstruction technique. CONTRAST:  57mL OMNIPAQUE IOHEXOL 300 MG/ML  SOLN COMPARISON:  Multiple exams, including 03/23/2020 and recent neck CT from 11/21/2022 FINDINGS: CT CHEST FINDINGS Cardiovascular: Mild thoracic aortic atherosclerotic vascular disease. Mediastinum/Nodes: Pathologic right paratracheal  node 2.1 cm in short axis on image 18 series 2. Lungs/Pleura: Right upper lobe nodule, 2.0 by 1.6 cm on image 35 series 4. A separate right upper lobe nodule with somewhat irregular margins measures 2.0 by 1.7 cm on image 52 of series 4, and may have subtle internal air bronchograms or cavitation. Musculoskeletal: Dextroconvex upper thoracic and levoconvex lower thoracic scoliosis with rotary component. No compelling findings of osseous metastatic disease. CT ABDOMEN PELVIS FINDINGS Hepatobiliary: A sharply defined fluid density lesion in segment 4a of the liver measures 1.1 by 1.0 cm on image 49 of series 2 and is likely a cyst. This measured 0.8 cm in diameter on 03/23/2020. 1.3 cm gallstone noted in the gallbladder. Pancreas: Unremarkable Spleen: Unremarkable Adrenals/Urinary Tract: Both adrenal glands appear normal. Right nephroureterectomy. The prostate gland indents the bladder base but no bladder mass is currently identified. Fluid density left kidney upper pole cyst appears benign and similar to the 2021 exam. No abnormal enhancement is identified along the remaining urothelium. Stomach/Bowel: Sigmoid colon diverticulosis. No findings of active diverticulitis. Vascular/Lymphatic: Atherosclerosis is present, including aortoiliac atherosclerotic disease. No pathologic adenopathy in the abdomen or pelvis. Reproductive: Notable prostatomegaly. Other: No supplemental non-categorized findings. Musculoskeletal: Chronic lucent lesion of the left iliac bone measuring 1.8 by 1.0 cm, with thin sclerotic margin and no substantial change from 2021, benign. Levoconvex thoracolumbar scoliosis. IMPRESSION: 1. Two right upper lobe pulmonary nodules, the larger of which measures 2.0 by 1.6 cm and the smaller 2.0 by 1.7 cm. There is also a pathologically enlarged right paratracheal lymph node measuring 2.1 cm in short axis. The appearance favors malignancy over atypical infection with reactive adenopathy. Tissue diagnosis is  likely indicated. 2. No findings of metastatic disease to the abdomen/pelvis or osseous structures. 3. Aortic atherosclerosis. 4. Sigmoid colon diverticulosis. 5. Prior right nephroureterectomy. 6. Prostatomegaly. 7. Cholelithiasis. Aortic Atherosclerosis (ICD10-I70.0). Electronically Signed   By: Van Clines M.D.   On: 11/23/2022 16:12     Past medical hx Past Medical History:  Diagnosis Date   Acquired solitary kidney    left --  s/p  right nephroureterctomy 01/ 2018   Anxiety    no current problems   Depression    no current problems   Elevated PSA    Enlarged lymph node 11/2022   enlarged right paratracheal lymph node.   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, negative margins)   History of kidney stones    passed stones and also surgery to remove   History of unilateral nephrectomy    01/ 2018  right nephroureterectomy for renal pelvis mass (carcinoma in situ)   HLD (hyperlipidemia)    on crestor   Hyperplasia of prostate without lower urinary tract symptoms (LUTS)    Lung cancer ([place]) 12/07/2022   Nephrolithiasis    bilateral non-obstructive   Pneumonia    x 1 - yrs ago   Pulmonary nodules 11/2022   2 right upper lobe pulmonary nodules   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 His smoking use included cigarettes. He has a 30.00 pack-year smoking history. He has never used smokeless tobacco. He reports current alcohol use of about 3.0 standard drinks of alcohol per week. He reports that he does not use drugs.  Tobacco Cessation: Former smoker, quit 1987 with a 30+ pack year smoking hisotry   Past surgical hx, Family hx, Social hx all reviewed.  Current Outpatient Medications on File Prior to Visit  Medication Sig   acetaminophen (TYLENOL) 500 MG tablet Take 1,000 mg by mouth every 6 (six) hours as needed for headache.   azithromycin (ZITHROMAX) 250 MG tablet Take two today and then one daily until finished. (Patient not taking: Reported on 12/22/2022)   cholecalciferol (VITAMIN D3) 25 MCG (1000 UNIT) tablet Take 1,000 Units by mouth daily.   CRANBERRY PO Take 650 mg by mouth daily.   Multiple Vitamins-Minerals (MULTIVITAMIN WITH MINERALS) tablet Take 1 tablet by mouth daily. Centrum   prochlorperazine (COMPAZINE) 10 MG tablet Take 1 tablet (10 mg total) by mouth every 6 (six) hours as needed for nausea or vomiting.   rosuvastatin (CRESTOR) 10 MG tablet TAKE 1 TABLET EACH DAY. (Patient taking differently: Take 20 mg by mouth daily.)   tamsulosin (FLOMAX) 0.4 MG CAPS capsule Take 1 capsule (0.4 mg total) by mouth daily. (Patient taking differently: Take 0.4 mg by mouth daily after breakfast.)   No current facility-administered medications on file prior to visit.     Allergies  Allergen Reactions   Lamisil [Terbinafine]     Tongue swelling    Review Of Systems:  Constitutional:   No  weight loss, night sweats,  Fevers, chills, fatigue, or  lassitude.  HEENT:   No headaches,  Difficulty swallowing,  Tooth/dental problems, or  Sore throat,                No sneezing, itching, ear ache, nasal congestion, post nasal drip,   CV:  No chest pain,  Orthopnea,  PND, swelling in lower extremities, anasarca, dizziness, palpitations, syncope.   GI  No heartburn, indigestion, abdominal pain, nausea, vomiting, diarrhea, change in bowel habits, loss of appetite, bloody stools.   Resp: No shortness of breath with exertion or at rest.  No excess mucus, no productive cough,  No non-productive cough,  No coughing up of blood.  No change in color of mucus.  No wheezing.  No chest wall deformity  Skin: no rash or lesions.  GU: no dysuria, change in color of urine, no urgency or frequency.  No flank pain, no hematuria   MS:  No joint pain or swelling.  No decreased range of motion.  No back pain.  Psych:  No change in mood or affect. No depression or anxiety.  No memory loss.   Vital Signs BP 132/82   Pulse (!) 48   Temp 98.4 F (36.9 C) (Oral)   Ht 5\' 5"  (1.651 m)   Wt 147 lb 12.8 oz (67 kg)   SpO2 96%   BMI 24.60 kg/m    Physical Exam:  General- No distress,  A&Ox3 ENT: No sinus tenderness, TM clear, pale nasal mucosa, no oral exudate,no post nasal drip, no LAN Cardiac: S1, S2, regular rate and rhythm, no murmur Chest: No wheeze/ rales/ dullness; no accessory muscle use, no nasal flaring, no sternal retractions Abd.: Soft Non-tender Ext: No clubbing cyanosis, edema Neuro:  normal strength Skin: No rashes, warm and dry Psych: normal mood and behavior   Assessment/Plan New Stage III NSCLC in patient with history of urothelial cancer involving the right kidney in 2018 and local disease in the bladder but has been treated surgically with Dr. Louis Meckel. Post Flexible video fiberoptic bronchoscopy with robotic assistance and biopsies by Dr. Valeta Harms. Former smoker quit 1987 Plan Concurrent chemoradiation with weekly carboplatin for AUC of 2 and paclitaxel 45 Mg/M2. First treatment of both chemo and radiation to start 12/25/2022 All follow up with radiation oncology and medical oncology have been scheduled. Follow up as needed   I spent 35 minutes  dedicated to the care of this patient on the date of this encounter to include pre-visit review of records, face-to-face time with the patient discussing conditions above, post visit ordering of testing, clinical documentation with the electronic health record, making appropriate referrals as documented, and communicating necessary information to the patient's healthcare team.    Magdalen Spatz, NP 12/22/2022  9:04 AM

## 2022-12-24 ENCOUNTER — Observation Stay (HOSPITAL_COMMUNITY): Payer: Medicare Other

## 2022-12-24 ENCOUNTER — Encounter (HOSPITAL_COMMUNITY): Payer: Self-pay

## 2022-12-24 ENCOUNTER — Other Ambulatory Visit: Payer: Self-pay

## 2022-12-24 ENCOUNTER — Observation Stay (HOSPITAL_COMMUNITY)
Admission: EM | Admit: 2022-12-24 | Discharge: 2022-12-25 | Disposition: A | Discharge status: Home / Self Care | Payer: Medicare Other | Attending: Internal Medicine | Admitting: Internal Medicine

## 2022-12-24 ENCOUNTER — Emergency Department (HOSPITAL_COMMUNITY): Payer: Medicare Other

## 2022-12-24 DIAGNOSIS — E782 Mixed hyperlipidemia: Secondary | ICD-10-CM | POA: Diagnosis not present

## 2022-12-24 DIAGNOSIS — S82451A Displaced comminuted fracture of shaft of right fibula, initial encounter for closed fracture: Secondary | ICD-10-CM | POA: Insufficient documentation

## 2022-12-24 DIAGNOSIS — R42 Dizziness and giddiness: Secondary | ICD-10-CM | POA: Diagnosis not present

## 2022-12-24 DIAGNOSIS — Z85528 Personal history of other malignant neoplasm of kidney: Secondary | ICD-10-CM | POA: Insufficient documentation

## 2022-12-24 DIAGNOSIS — R2681 Unsteadiness on feet: Secondary | ICD-10-CM | POA: Insufficient documentation

## 2022-12-24 DIAGNOSIS — Z8551 Personal history of malignant neoplasm of bladder: Secondary | ICD-10-CM | POA: Insufficient documentation

## 2022-12-24 DIAGNOSIS — E860 Dehydration: Secondary | ICD-10-CM | POA: Diagnosis not present

## 2022-12-24 DIAGNOSIS — W19XXXA Unspecified fall, initial encounter: Secondary | ICD-10-CM | POA: Diagnosis not present

## 2022-12-24 DIAGNOSIS — R55 Syncope and collapse: Principal | ICD-10-CM | POA: Insufficient documentation

## 2022-12-24 DIAGNOSIS — N400 Enlarged prostate without lower urinary tract symptoms: Secondary | ICD-10-CM | POA: Diagnosis present

## 2022-12-24 DIAGNOSIS — Z87891 Personal history of nicotine dependence: Secondary | ICD-10-CM | POA: Insufficient documentation

## 2022-12-24 DIAGNOSIS — R300 Dysuria: Secondary | ICD-10-CM | POA: Insufficient documentation

## 2022-12-24 DIAGNOSIS — C3490 Malignant neoplasm of unspecified part of unspecified bronchus or lung: Secondary | ICD-10-CM | POA: Diagnosis not present

## 2022-12-24 DIAGNOSIS — R110 Nausea: Secondary | ICD-10-CM | POA: Diagnosis not present

## 2022-12-24 DIAGNOSIS — S82401A Unspecified fracture of shaft of right fibula, initial encounter for closed fracture: Secondary | ICD-10-CM | POA: Diagnosis present

## 2022-12-24 DIAGNOSIS — S82831A Other fracture of upper and lower end of right fibula, initial encounter for closed fracture: Secondary | ICD-10-CM | POA: Diagnosis not present

## 2022-12-24 LAB — CBC WITH DIFFERENTIAL/PLATELET
Abs Immature Granulocytes: 0.02 10*3/uL (ref 0.00–0.07)
Basophils Absolute: 0 10*3/uL (ref 0.0–0.1)
Basophils Relative: 0 %
Eosinophils Absolute: 0.1 10*3/uL (ref 0.0–0.5)
Eosinophils Relative: 3 %
HCT: 44.6 % (ref 39.0–52.0)
Hemoglobin: 15.1 g/dL (ref 13.0–17.0)
Immature Granulocytes: 0 %
Lymphocytes Relative: 22 %
Lymphs Abs: 1.2 10*3/uL (ref 0.7–4.0)
MCH: 29.2 pg (ref 26.0–34.0)
MCHC: 33.9 g/dL (ref 30.0–36.0)
MCV: 86.3 fL (ref 80.0–100.0)
Monocytes Absolute: 0.4 10*3/uL (ref 0.1–1.0)
Monocytes Relative: 8 %
Neutro Abs: 3.5 10*3/uL (ref 1.7–7.7)
Neutrophils Relative %: 67 %
Platelets: 179 10*3/uL (ref 150–400)
RBC: 5.17 MIL/uL (ref 4.22–5.81)
RDW: 12.6 % (ref 11.5–15.5)
WBC: 5.3 10*3/uL (ref 4.0–10.5)
nRBC: 0 % (ref 0.0–0.2)

## 2022-12-24 LAB — CBG MONITORING, ED: Glucose-Capillary: 113 mg/dL — ABNORMAL HIGH (ref 70–99)

## 2022-12-24 LAB — COMPREHENSIVE METABOLIC PANEL
ALT: 19 U/L (ref 0–44)
AST: 30 U/L (ref 15–41)
Albumin: 3.9 g/dL (ref 3.5–5.0)
Alkaline Phosphatase: 56 U/L (ref 38–126)
Anion gap: 13 (ref 5–15)
BUN: 23 mg/dL (ref 8–23)
CO2: 23 mmol/L (ref 22–32)
Calcium: 9.2 mg/dL (ref 8.9–10.3)
Chloride: 103 mmol/L (ref 98–111)
Creatinine, Ser: 1.17 mg/dL (ref 0.61–1.24)
GFR, Estimated: >60 mL/min (ref >60)
Glucose, Bld: 127 mg/dL — ABNORMAL HIGH (ref 70–99)
Potassium: 3.6 mmol/L (ref 3.5–5.1)
Sodium: 139 mmol/L (ref 135–145)
Total Bilirubin: 1.1 mg/dL (ref 0.3–1.2)
Total Protein: 7.2 g/dL (ref 6.5–8.1)

## 2022-12-24 LAB — MAGNESIUM: Magnesium: 2.1 mg/dL (ref 1.7–2.4)

## 2022-12-24 MED ORDER — ROSUVASTATIN CALCIUM 20 MG PO TABS
20.0000 mg | ORAL_TABLET | Freq: Every day | ORAL | Status: DC
  Administered 2022-12-25: 20 mg via ORAL
  Filled 2022-12-24: qty 1

## 2022-12-24 MED ORDER — OXYCODONE HCL 5 MG PO TABS: 5.0000 mg | ORAL_TABLET | Freq: Four times a day (QID) | ORAL | Status: DC | PRN

## 2022-12-24 MED ORDER — TAMSULOSIN HCL 0.4 MG PO CAPS
0.4000 mg | ORAL_CAPSULE | Freq: Every day | ORAL | Status: DC
  Administered 2022-12-25: 0.4 mg via ORAL
  Filled 2022-12-24: qty 1

## 2022-12-24 MED ORDER — LACTATED RINGERS IV SOLN: INTRAVENOUS | Status: DC

## 2022-12-24 MED ORDER — SODIUM CHLORIDE 0.9 % IV BOLUS
1000.0000 mL | Freq: Once | INTRAVENOUS | Status: AC
  Administered 2022-12-24: 1000 mL via INTRAVENOUS

## 2022-12-24 MED ORDER — ADULT MULTIVITAMIN W/MINERALS CH
1.0000 | ORAL_TABLET | Freq: Every day | ORAL | Status: DC
  Administered 2022-12-25: 1 via ORAL
  Filled 2022-12-24: qty 1

## 2022-12-24 MED ORDER — ACETAMINOPHEN 325 MG PO TABS
650.0000 mg | ORAL_TABLET | Freq: Four times a day (QID) | ORAL | Status: DC | PRN
  Administered 2022-12-24: 650 mg via ORAL
  Filled 2022-12-24: qty 2

## 2022-12-24 MED ORDER — POLYETHYLENE GLYCOL 3350 17 G PO PACK: 17.0000 g | PACK | Freq: Every day | ORAL | Status: DC | PRN

## 2022-12-24 MED ORDER — HYDROMORPHONE HCL 1 MG/ML IJ SOLN: 0.5000 mg | INTRAMUSCULAR | Status: DC | PRN

## 2022-12-24 MED ORDER — VITAMIN D 25 MCG (1000 UNIT) PO TABS
1000.0000 [IU] | ORAL_TABLET | Freq: Every day | ORAL | Status: DC
  Administered 2022-12-25: 1000 [IU] via ORAL
  Filled 2022-12-24: qty 1

## 2022-12-24 MED ORDER — MELATONIN 3 MG PO TABS: 3.0000 mg | ORAL_TABLET | Freq: Every evening | ORAL | Status: DC | PRN

## 2022-12-24 MED ORDER — ENOXAPARIN SODIUM 40 MG/0.4ML IJ SOSY
40.0000 mg | PREFILLED_SYRINGE | INTRAMUSCULAR | Status: DC
  Administered 2022-12-24: 40 mg via SUBCUTANEOUS
  Filled 2022-12-24: qty 0.4

## 2022-12-24 MED ORDER — PROCHLORPERAZINE EDISYLATE 10 MG/2ML IJ SOLN: 5.0000 mg | Freq: Four times a day (QID) | INTRAMUSCULAR | Status: DC | PRN

## 2022-12-24 NOTE — ED Notes (Signed)
Dr. Regenia Skeeter in room with pt at this time.

## 2022-12-24 NOTE — ED Triage Notes (Signed)
Patient brought in by EMS due to having 2 syncopal episodes today while at a party. EMS reports that patient was witnessed to fall to his knees, then other helped assist patient to the ground. Each episode lasted about 30 seconds. Pt has history of hypotension and recently diagnosed with lung cancer. Patient reports a 30 mile bike ride yesterday and a 30 minute walk on the treadmill today. Pt's only complaints is right ankle pain and bilateral knee pain.

## 2022-12-24 NOTE — H&P (Addendum)
History and Physical  Alan Mendez:[id number] DOB: 06-10-1941 DOA: 12/24/2022  Referring physician: Dr. Regenia Skeeter, EDP  PCP: Osborne Casco [name], MD  Outpatient Specialists: Medical oncology, radiation oncology, pulmonary. Patient coming from: Independent living facility.  Chief Complaint: Syncope  HPI: Alan Mendez is a 82 y.o. male with medical history significant for newly diagnosed lung cancer, stage III non-small cell lung carcinoma, with plan for first dose of chemoradiation tomorrow 12/25/22, BPH, hyperlipidemia, who presented to [facility] ED from [place] via EMS due to 2 syncopal episodes prior to presenting to the ED.  The patient was at a [employer].  He suddenly felt nauseous, tried to stand up, and passed out.  He had another episode of syncope.  A physician who was present at the scene stated that after passing out the first time, the patient briefly woke up and passed out again.  Per the patient, he was sweating profusely after he came back to himself.  No chest pain, lethargy, or incontinence.  Felt pain in his right ankle and left knee.  EMS was activated.  Per collateral, each episode lasted about 30 seconds.  Endorses that yesterday he rode about 30 mile on his bike ride and fast-walked for 30 minutes on the treadmill this afternoon.  Also endorses intermittent dysuria for the past few days without subjective fevers or chills.  UA is pending.  In the ED, afebrile, with elevated BP.  Orthostatic vital signs are pending.  Lab studies are unremarkable.  He received 1 L IV fluid bolus NS x 1.  EDP requested admission for syncope workup.  The patient was admitted by [facility], hospitalist service.  After being admitted, results of R ankle xray returned showing mildly displaced fracture of the distal fibula.  Discussed with orthopedic surgery Dr. Alvan Dame.  Nonsurgical fracture, will place the patient on CAM Walker boot.  ED Course: Tmax 97.6.  BP 152/70, pulse 86, respiratory 17, saturation 98% on  room air.  CMP remarkable for glucose of 127.  CBC with differentials unremarkable.  Review of Systems: Review of systems as noted in the HPI. All other systems reviewed and are negative.   Past Medical History:  Diagnosis Date   Acquired solitary kidney    left --  s/p  right nephroureterctomy 01/ 2018   Anxiety    no current problems   Depression    no current problems   Elevated PSA    Enlarged lymph node 11/2022   enlarged right paratracheal lymph node.   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, negative margins)   History of kidney stones    passed stones and also surgery to remove   History of unilateral nephrectomy    01/ 2018  right nephroureterectomy for renal pelvis mass (carcinoma in situ)   HLD (hyperlipidemia)    on crestor   Hyperplasia of prostate without lower urinary tract symptoms (LUTS)    Lung cancer ([place]) 12/07/2022   Nephrolithiasis    bilateral non-obstructive   Pneumonia    x 1 - yrs ago   Pulmonary nodules 11/2022   2 right upper lobe pulmonary nodules   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HCC)    Renal cyst, acquired, right    right kidney removed   Wears  glasses    Past Surgical History:  Procedure Laterality Date   APPENDECTOMY  child   BRONCHIAL BIOPSY  12/07/2022   Procedure: BRONCHIAL BIOPSIES;  Surgeon: Garner Nash, DO;  Location: [place] [facility];  Service: Pulmonary;;   BRONCHIAL BRUSHINGS  12/07/2022   Procedure: BRONCHIAL BRUSHINGS;  Surgeon: Garner Nash, DO;  Location: [place];  Service: Pulmonary;;   BRONCHIAL NEEDLE ASPIRATION BIOPSY  12/07/2022   Procedure: BRONCHIAL NEEDLE ASPIRATION BIOPSIES;  Surgeon: Garner Nash, DO;  Location: [place];  Service: Pulmonary;;   CATARACT EXTRACTION W/  INTRAOCULAR LENS  IMPLANT, BILATERAL  2017   COLONOSCOPY  08/15/2012   Tubular Adenoma, No high grade dysplasia or malignacy.   CYSTOSCOPY W/ RETROGRADES Right 09/25/2016   Procedure: CYSTOSCOPY, URETHRAL DILITATION  WITH RETROGRADE PYELOGRAM, BRUSH BIOPSIES OF RIGHT RENAL MASS;  Surgeon: Carolan Clines, MD;  Location: [place];  Service: Urology;  Laterality: Right;   CYSTOSCOPY W/ RETROGRADES Left 03/16/2017   Procedure: CYSTOSCOPY WITH RETROGRADE PYELOGRAM;  Surgeon: Ardis Hughs, MD;  Location: [facility];  Service: Urology;  Laterality: Left;   CYSTOSCOPY W/ URETERAL STENT REMOVAL Right 09/25/2016   Procedure: CYSTOSCOPY WITH STENT REMOVAL;  Surgeon: Carolan Clines, MD;  Location: [place];  Service: Urology;  Laterality: Right;   CYSTOSCOPY WITH RETROGRADE PYELOGRAM, URETEROSCOPY AND STENT PLACEMENT Right 08/21/2016   Procedure: CYSTOSCOPY WITH RIGHT RETROGRADE PYELOGRAM, RIGHT FLEXIBLE AND RIGID URETEROSCOPY, INSERTION DOUBLE J STENT RIGHT;  Surgeon: Carolan Clines, MD;  Location: [place];  Service: Urology;  Laterality: Right;   EXTRACORPOREAL SHOCK WAVE LITHOTRIPSY  2009   KNEE SURGERY Right 1980's   ROBOT ASSITED LAPAROSCOPIC NEPHROURETERECTOMY Right 11/15/2016   Procedure: RIGHT XI ROBOT ASSITED LAPAROSCOPIC NEPHROURETERECTOMY;  Surgeon: Ardis Hughs, MD;  [facility];  Service: Urology;  Laterality: Right;   TRANSURETHRAL RESECTION OF BLADDER TUMOR WITH MITOMYCIN-C Left 03/16/2017   Procedure: CYSTOSCOPY BIOPSIES OF BLADDER TUMOR WITH FULGURATION;  Surgeon: Ardis Hughs, MD;  Location[facility];  Service: Urology;  Laterality: Left;   URETEROSCOPY Right 09/25/2016   Procedure: RIGHT URETEROSCOPY;  Surgeon: Carolan Clines, MD;  Location: [facility];  Service: Urology;  Laterality: Right;   VIDEO BRONCHOSCOPY WITH ENDOBRONCHIAL ULTRASOUND Right  12/07/2022   Procedure: VIDEO BRONCHOSCOPY WITH ENDOBRONCHIAL ULTRASOUND;  Surgeon: Garner Nash, DO;  Location: [place];  Service: Pulmonary;  Laterality: Right;    Social History:  reports that he quit smoking about 37 years ago. His smoking use included cigarettes. He has a 30.00 pack-year smoking history. He has never used smokeless tobacco. He reports current alcohol use of about 3.0 standard drinks of alcohol per week. He reports that he does not use drugs.   Allergies  Allergen Reactions   Lamisil [Terbinafine]     Tongue swelling    Family History  Problem Relation Age of Onset   Heart disease Mother    Cancer Father    Cancer Paternal Grandmother    Heart disease Paternal Grandfather       Prior to Admission medications   Medication Sig Start Date End Date Taking? Authorizing Provider  acetaminophen (TYLENOL) 500 MG tablet Take 1,000 mg by mouth every 6 (six) hours as needed for headache.    [provider]  azithromycin (ZITHROMAX) 250 MG tablet Take two today and then one daily until finished. Patient not taking: Reported on 12/22/2022 12/11/22   Garner Nash, DO  cholecalciferol (  VITAMIN D3) 25 MCG (1000 UNIT) tablet Take 1,000 Units by mouth daily.    [provider]  CRANBERRY PO Take 650 mg by mouth daily.    [provider]  Multiple Vitamins-Minerals (MULTIVITAMIN WITH MINERALS) tablet Take 1 tablet by mouth daily. Centrum    [provider]  prochlorperazine (COMPAZINE) 10 MG tablet Take 1 tablet (10 mg total) by mouth every 6 (six) hours as needed for nausea or vomiting. 12/18/22   Curt Bears, MD  rosuvastatin (CRESTOR) 10 MG tablet TAKE 1 TABLET EACH DAY. Patient taking differently: Take 20 mg by mouth daily. 12/31/15   Jaynee Eagles, PA-C  tamsulosin (FLOMAX) 0.4 MG CAPS capsule Take 1 capsule (0.4 mg total) by mouth daily. Patient taking differently: Take 0.4 mg by mouth daily after breakfast. 09/25/16   Carolan Clines, MD    Physical Exam: BP (!) 153/84   Pulse 82   Temp 97.6 F (36.4 C) (Oral)   Resp (!) 21   Ht 5\' 5"  (1.651 m)   Wt 66.2 kg   SpO2 98%   BMI 24.30 kg/m   General: 82 y.o. year-old male well developed well nourished in no acute distress.  Alert and oriented x3. Cardiovascular: Regular rate and rhythm with no rubs or gallops.  No thyromegaly or JVD noted.  2/4 pulses in all 4 extremities. Respiratory: Clear to auscultation with no wheezes or rales. Good inspiratory effort. Abdomen: Soft nontender nondistended with normal bowel sounds x4 quadrants. Muskuloskeletal: No cyanosis or clubbing.  Mild edema right ankle. Neuro: CN II-XII intact, strength, sensation, reflexes Skin: No ulcerative lesions noted or rashes Psychiatry: Judgement and insight appear normal. Mood is appropriate for condition and setting          Labs on Admission:  Basic Metabolic Panel: Recent Labs  Lab 12/24/22 1849  NA 139  K 3.6  CL 103  CO2 23  GLUCOSE 127*  BUN 23  CREATININE 1.17  CALCIUM 9.2  MG 2.1   Liver Function Tests: Recent Labs  Lab 12/24/22 1849  AST 30  ALT 19  ALKPHOS 56  BILITOT 1.1  PROT 7.2  ALBUMIN 3.9   No results for input(s): "LIPASE", "AMYLASE" in the last 168 hours. No results for input(s): "AMMONIA" in the last 168 hours. CBC: Recent Labs  Lab 12/24/22 1849  WBC 5.3  NEUTROABS 3.5  HGB 15.1  HCT 44.6  MCV 86.3  PLT 179   Cardiac Enzymes: No results for input(s): "CKTOTAL", "CKMB", "CKMBINDEX", "TROPONINI" in the last 168 hours.  BNP (last 3 results) No results for input(s): "BNP" in the last 8760 hours.  ProBNP (last 3 results) No results for input(s): "PROBNP" in the last 8760 hours.  CBG: Recent Labs  Lab 12/24/22 1822  GLUCAP 113*    Radiological Exams on Admission: DG Chest Portable 1 View  Result Date: 12/24/2022 CLINICAL DATA:  Syncope EXAM: PORTABLE CHEST 1 VIEW COMPARISON:  Chest x-ray 12/07/2022 FINDINGS: The heart size  and mediastinal contours are within normal limits. Both lungs are clear. Again seen is scoliosis of the thoracic spine. No acute fractures are seen. IMPRESSION: No active disease. Electronically Signed   By: Ronney Asters M.D.   On: 12/24/2022 19:06    EKG: I independently viewed the EKG done and my findings are as followed: Sinus rhythm rate of 72.  Nonspecific ST-T changes.  QTc 477.  Occasional PVCs.  Incomplete LBBB.  Assessment/Plan Present on Admission:  Syncope  Principal Problem:   Syncope  Syncope, unclear etiology Rule out cardiac cause of syncope. Obtain orthostatic vital signs Follow 2D echo Closely monitor on telemetry. PT OT evaluation in the morning. Continue IV fluid hydration Permissive hypertension. Fall precautions  Right ankle fracture, post fall from syncope collapse, POA Seen on x-ray Results of R ankle xray returned showing mildly displaced fracture of the distal fibula.  Discussed with orthopedic surgery Dr. Alvan Dame.  Nonsurgical fracture, will place the patient on CAM Walker boot. As needed analgesics, as needed bowel regimen.  Newly diagnosed stage III non-small cell lung cancer Plan for first dose of chemoradiation on 12/25/2022 Consult oncology in the morning-follows with Dr. Earlie Server and Dr. Lisbeth Renshaw.  Dysuria Afebrile with no leukocytosis UA/Urine culture ordered and are pending Treat if indicated.  BPH Resume home regimen Monitor urine output  Hyperlipidemia Resume home regimen   DVT prophylaxis: Subcu Lovenox daily  Code Status: Full code  Family Communication: Updated his son, who drove from [place] to see his father, at bedside.  Disposition Plan: Admitted to telemetry unit  Consults called: Orthopedic surgery  Admission status: Observation status   Status is: Observation    Kayleen Memos MD [facility] Pager [phone]  If 7PM-7AM, please contact night-coverage [email or web address] Password [facility]  12/24/2022, 8:21 PM

## 2022-12-24 NOTE — ED Notes (Signed)
ED TO INPATIENT HANDOFF REPORT  ED Nurse Name and Phone #: Suzie Portela Name/Age/Gender Alan Mendez 82 y.o. male Room/Bed: WA05/WA05  Code Status   Code Status: Full Code  Home/SNF/Other Home Patient oriented to: self, place, time, and situation Is this baseline? Yes   Triage Complete: Triage complete  Chief Complaint Syncope [R55]  Triage Note Patient brought in by EMS due to having 2 syncopal episodes today while at a party. EMS reports that patient was witnessed to fall to his knees, then other helped assist patient to the ground. Each episode lasted about 30 seconds. Pt has history of hypotension and recently diagnosed with lung cancer. Patient reports a 30 mile bike ride yesterday and a 30 minute walk on the treadmill today. Pt's only complaints is right ankle pain and bilateral knee pain.   Allergies Allergies  Allergen Reactions   Lamisil [Terbinafine]     Tongue swelling    Level of Care/Admitting Diagnosis ED Disposition     ED Disposition  Admit   Condition  --   Comment  Hospital Area: [facility] [[id number]]  Level of Care: Telemetry [5]  Admit to tele based on following criteria: Monitor for Ischemic changes  May place patient in observation at [facility] or [place] if equivalent level of care is available:: Yes  Covid Evaluation: Asymptomatic - no recent exposure (last 10 days) testing not required  Diagnosis: Syncope [206001]  Admitting Physician: Kayleen Memos [[id number]]  Attending Physician: Kayleen Memos [[id number]]          B Medical/Surgery History Past Medical History:  Diagnosis Date   Acquired solitary kidney    left --  s/p  right nephroureterctomy 01/ 2018   Anxiety    no current problems   Depression    no current problems   Elevated PSA    Enlarged lymph node 11/2022   enlarged right paratracheal lymph node.   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, negative margins)   History of kidney stones    passed stones and also surgery to remove   History of unilateral nephrectomy    01/ 2018  right nephroureterectomy for renal pelvis mass (carcinoma in situ)   HLD (hyperlipidemia)    on crestor   Hyperplasia of prostate without lower urinary tract symptoms (LUTS)    Lung cancer ([place]) 12/07/2022   Nephrolithiasis    bilateral non-obstructive   Pneumonia    x 1 - yrs ago   Pulmonary nodules 11/2022   2 right upper lobe pulmonary nodules   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HCC)    Renal cyst, acquired, right    right kidney removed   Wears glasses    Past Surgical History:  Procedure Laterality Date   APPENDECTOMY  child   BRONCHIAL BIOPSY  12/07/2022   Procedure: BRONCHIAL BIOPSIES;  Surgeon: Garner Nash, DO;  Location: [place] [facility];  Service: Pulmonary;;   BRONCHIAL BRUSHINGS  12/07/2022   Procedure: BRONCHIAL BRUSHINGS;  Surgeon: Garner Nash, DO;  Location: [place] [facility];  Service: Pulmonary;;   BRONCHIAL NEEDLE ASPIRATION BIOPSY  12/07/2022   Procedure: BRONCHIAL NEEDLE ASPIRATION BIOPSIES;  Surgeon: Garner Nash, DO;  Location: [place] [facility];  Service: Pulmonary;;  CATARACT EXTRACTION W/ INTRAOCULAR LENS  IMPLANT, BILATERAL  2017   COLONOSCOPY  08/15/2012   Tubular Adenoma, No high grade dysplasia or malignacy.   CYSTOSCOPY W/ RETROGRADES Right 09/25/2016   Procedure: CYSTOSCOPY, URETHRAL DILITATION  WITH RETROGRADE PYELOGRAM, BRUSH BIOPSIES OF RIGHT RENAL MASS;  Surgeon: Carolan Clines, MD;  Location: [place];  Service: Urology;  Laterality: Right;   CYSTOSCOPY W/ RETROGRADES Left 03/16/2017   Procedure: CYSTOSCOPY WITH RETROGRADE PYELOGRAM;  Surgeon: Ardis Hughs, MD;  Location: [facility];   Service: Urology;  Laterality: Left;   CYSTOSCOPY W/ URETERAL STENT REMOVAL Right 09/25/2016   Procedure: CYSTOSCOPY WITH STENT REMOVAL;  Surgeon: Carolan Clines, MD;  Location: [place];  Service: Urology;  Laterality: Right;   CYSTOSCOPY WITH RETROGRADE PYELOGRAM, URETEROSCOPY AND STENT PLACEMENT Right 08/21/2016   Procedure: CYSTOSCOPY WITH RIGHT RETROGRADE PYELOGRAM, RIGHT FLEXIBLE AND RIGID URETEROSCOPY, INSERTION DOUBLE J STENT RIGHT;  Surgeon: Carolan Clines, MD;  Location: [place];  Service: Urology;  Laterality: Right;   EXTRACORPOREAL SHOCK WAVE LITHOTRIPSY  2009   KNEE SURGERY Right 1980's   ROBOT ASSITED LAPAROSCOPIC NEPHROURETERECTOMY Right 11/15/2016   Procedure: RIGHT XI ROBOT ASSITED LAPAROSCOPIC NEPHROURETERECTOMY;  Surgeon: Ardis Hughs, MD;  [facility];  Service: Urology;  Laterality: Right;   TRANSURETHRAL RESECTION OF BLADDER TUMOR WITH MITOMYCIN-C Left 03/16/2017   Procedure: CYSTOSCOPY BIOPSIES OF BLADDER TUMOR WITH FULGURATION;  Surgeon: Ardis Hughs, MD;  Location[facility];  Service: Urology;  Laterality: Left;   URETEROSCOPY Right 09/25/2016   Procedure: RIGHT URETEROSCOPY;  Surgeon: Carolan Clines, MD;  Location: [facility];  Service: Urology;  Laterality: Right;   VIDEO BRONCHOSCOPY WITH ENDOBRONCHIAL ULTRASOUND Right 12/07/2022   Procedure: VIDEO BRONCHOSCOPY WITH ENDOBRONCHIAL ULTRASOUND;  Surgeon: Garner Nash, DO;  Location: [place];  Service: Pulmonary;  Laterality: Right;     A IV Location/Drains/Wounds Patient Lines/Drains/Airways Status     Active Line/Drains/Airways     Name Placement date Placement time Site Days   Peripheral IV 12/24/22 20 G 1.25" Anterior;Distal;Left;Upper Arm 12/24/22  1932  Arm  less than 1   Urethral Catheter Dr. Louis Meckel Latex 18 Fr. 03/16/17  1521  Latex  2109   Ureteral Drain/Stent Left ureter 6 Fr. 03/16/17  1520  Left  ureter  2109   Incision - 3 Ports Abdomen 1: Right;Upper;Lateral 2: Right;Lower;Mid 3: Lower;Mid 11/15/16  0820  -- 2230            Intake/Output Last 24 hours No intake or output data in the 24 hours ending 12/24/22 2100  Labs/Imaging Results for orders placed or performed during the hospital encounter of 12/24/22 (from the past 48 hour(s))  CBG monitoring, ED     Status: Abnormal   Collection Time: 12/24/22  6:22 PM  Result Value Ref Range   Glucose-Capillary 113 (H) 70 - 99 mg/dL    Comment: Glucose reference range applies only to samples taken after fasting for at least 8 hours.  CBC with Differential     Status: None   Collection Time: 12/24/22  6:49 PM  Result Value Ref Range   WBC 5.3 4.0 - 10.5 K/uL   RBC 5.17 4.22 - 5.81 MIL/uL   Hemoglobin 15.1 13.0 - 17.0 g/dL   HCT 44.6 39.0 - 52.0 %   MCV 86.3 80.0 - 100.0 fL   MCH 29.2 26.0 - 34.0 pg   MCHC 33.9 30.0 - 36.0 g/dL  RDW 12.6 11.5 - 15.5 %   Platelets 179 150 - 400 K/uL   nRBC 0.0 0.0 - 0.2 %   Neutrophils Relative % 67 %   Neutro Abs 3.5 1.7 - 7.7 K/uL   Lymphocytes Relative 22 %   Lymphs Abs 1.2 0.7 - 4.0 K/uL   Monocytes Relative 8 %   Monocytes Absolute 0.4 0.1 - 1.0 K/uL   Eosinophils Relative 3 %   Eosinophils Absolute 0.1 0.0 - 0.5 K/uL   Basophils Relative 0 %   Basophils Absolute 0.0 0.0 - 0.1 K/uL   Immature Granulocytes 0 %   Abs Immature Granulocytes 0.02 0.00 - 0.07 K/uL    Comment: Performed at [facility], [place] [address], [place], [place] [address]  Comprehensive metabolic panel     Status: Abnormal   Collection Time: 12/24/22  6:49 PM  Result Value Ref Range   Sodium 139 135 - 145 mmol/L   Potassium 3.6 3.5 - 5.1 mmol/L   Chloride 103 98 - 111 mmol/L   CO2 23 22 - 32 mmol/L   Glucose, Bld 127 (H) 70 - 99 mg/dL    Comment: Glucose reference range applies only to samples taken after fasting for at least 8 hours.   BUN 23 8 - 23 mg/dL   Creatinine, Ser 1.17 0.61 - 1.24  mg/dL   Calcium 9.2 8.9 - 10.3 mg/dL   Total Protein 7.2 6.5 - 8.1 g/dL   Albumin 3.9 3.5 - 5.0 g/dL   AST 30 15 - 41 U/L   ALT 19 0 - 44 U/L   Alkaline Phosphatase 56 38 - 126 U/L   Total Bilirubin 1.1 0.3 - 1.2 mg/dL   GFR, Estimated >60 >60 mL/min    Comment: (NOTE) Calculated using the CKD-EPI Creatinine Equation (2021)    Anion gap 13 5 - 15    Comment: Performed at [facility], [place] [address] [place], [place] [address]  Magnesium     Status: None   Collection Time: 12/24/22  6:49 PM  Result Value Ref Range   Magnesium 2.1 1.7 - 2.4 mg/dL    Comment: Performed at [facility], [place] [address], [place], [place] [address]   DG Chest Portable 1 View  Result Date: 12/24/2022 CLINICAL DATA:  Syncope EXAM: PORTABLE CHEST 1 VIEW COMPARISON:  Chest x-ray 12/07/2022 FINDINGS: The heart size and mediastinal contours are within normal limits. Both lungs are clear. Again seen is scoliosis of the thoracic spine. No acute fractures are seen. IMPRESSION: No active disease. Electronically Signed   By: Ronney Asters M.D.   On: 12/24/2022 19:06    Pending Labs Unresulted Labs (From admission, onward)     Start     Ordered   12/31/22 0500  Creatinine, serum  (enoxaparin (LOVENOX)    CrCl >/= 30 ml/min)  Weekly,   R     Comments: while on enoxaparin therapy    12/24/22 2017   12/25/22 0500  CBC  Tomorrow morning,   R        12/24/22 2032   12/25/22 4008  Basic metabolic panel  Tomorrow morning,   R        12/24/22 2032            Vitals/Pain Today's Vitals   12/24/22 1845 12/24/22 1945 12/24/22 2000 12/24/22 2030  BP: (!) 144/67 (!) 153/84 (!) 152/70 (!) 133/103  Pulse: 69 82 86 80  Resp: 18 (!) 21 17  15  Temp:      TempSrc:      SpO2: 94% 98% 98% 98%  Weight:      Height:      PainSc:        Isolation Precautions No active isolations  Medications Medications  enoxaparin (LOVENOX) injection 40 mg (has no administration in time  range)  multivitamin with minerals tablet 1 tablet (has no administration in time range)  rosuvastatin (CRESTOR) tablet 20 mg (has no administration in time range)  tamsulosin (FLOMAX) capsule 0.4 mg (has no administration in time range)  cholecalciferol (VITAMIN D3) 25 MCG (1000 UNIT) tablet 1,000 Units (has no administration in time range)  lactated ringers infusion (has no administration in time range)  acetaminophen (TYLENOL) tablet 650 mg (has no administration in time range)  prochlorperazine (COMPAZINE) injection 5 mg (has no administration in time range)  polyethylene glycol (MIRALAX / GLYCOLAX) packet 17 g (has no administration in time range)  melatonin tablet 3 mg (has no administration in time range)  sodium chloride 0.9 % bolus 1,000 mL (1,000 mLs Intravenous New Bag/Given 12/24/22 1932)    Mobility walks     Focused Assessments Cardiac Assessment Handoff:    No results found for: "CKTOTAL", "CKMB", "CKMBINDEX", "TROPONINI" No results found for: "DDIMER" Does the Patient currently have chest pain? No    R Recommendations: See Admitting Provider Note  Report given to:   Additional Notes:

## 2022-12-24 NOTE — ED Provider Notes (Signed)
[place] EMERGENCY DEPARTMENT AT [facility] Provider Note   CSN: [id number] Arrival date & time: 12/24/22  1816     History  Chief Complaint  Patient presents with   Loss of Consciousness    Alan Mendez is a 82 y.o. male.  HPI 82 year old male with a recent diagnosis of lung cancer presents with syncope.  He was at a party and seated while he was eating.  He acutely felt nauseous and then passed out.  A physician that saw him stated that he passed out, woke up briefly and then passed out again.  The patient states the onset of nausea to passing out was less than a minute.  He did not feel any palpitations, chest pain, shortness of breath, headache.  No focal weakness or numbness.  He feels like his lips are dry currently but otherwise he has been feeling well.  He rode his bike for the first time in a long time and rode about 30 miles and felt tired afterwards but not overtly so more than he would have thought from being out of shape.  He also walked on the treadmill for 30 minutes today and felt fine.  Home Medications Prior to Admission medications   Medication Sig Start Date End Date Taking? Authorizing Provider  acetaminophen (TYLENOL) 500 MG tablet Take 1,000 mg by mouth every 6 (six) hours as needed for headache.    [provider]  azithromycin (ZITHROMAX) 250 MG tablet Take two today and then one daily until finished. Patient not taking: Reported on 12/22/2022 12/11/22   June Leap L, DO  cholecalciferol (VITAMIN D3) 25 MCG (1000 UNIT) tablet Take 1,000 Units by mouth daily.    [provider]  CRANBERRY PO Take 650 mg by mouth daily.    [provider]  Multiple Vitamins-Minerals (MULTIVITAMIN WITH MINERALS) tablet Take 1 tablet by mouth daily. Centrum    [provider]  prochlorperazine (COMPAZINE) 10 MG tablet Take 1 tablet (10 mg total) by mouth every 6 (six) hours as needed for nausea or vomiting. 12/18/22   Curt Bears, MD  rosuvastatin (CRESTOR) 10 MG tablet TAKE 1 TABLET EACH DAY. Patient taking differently: Take 20 mg by mouth daily. 12/31/15   Jaynee Eagles, PA-C  tamsulosin (FLOMAX) 0.4 MG CAPS capsule Take 1 capsule (0.4 mg total) by mouth daily. Patient taking differently: Take 0.4 mg by mouth daily after breakfast. 09/25/16   Carolan Clines, MD      Allergies    Lamisil [terbinafine]    Review of Systems   Review of Systems  Respiratory:  Negative for shortness of breath.   Cardiovascular:  Negative for chest pain.  Gastrointestinal:  Positive for nausea. Negative for abdominal pain and vomiting.  Neurological:  Positive for syncope.    Physical Exam Updated Vital Signs BP (!) 133/103   Pulse 80   Temp 97.6 F (36.4 C) (Oral)   Resp 15   Ht 5\' 5"  (1.651 m)   Wt 66.2 kg   SpO2 98%   BMI 24.30 kg/m  Physical Exam Vitals and nursing note reviewed.  Constitutional:      Appearance: He is well-developed.  HENT:     Head: Normocephalic and atraumatic.  Cardiovascular:     Rate and Rhythm: Normal rate and regular rhythm.     Heart sounds: Normal heart sounds.  Pulmonary:     Effort: Pulmonary effort is normal.     Breath sounds: Normal breath sounds.  Abdominal:  General: There is no distension.     Palpations: Abdomen is soft.     Tenderness: There is no abdominal tenderness.  Skin:    General: Skin is warm and dry.  Neurological:     Mental Status: He is alert.     Comments: 5/5 strength in all 4 extremities.  Awake, alert, no slurred speech or facial droop.     ED Results / Procedures / Treatments   Labs (all labs ordered are listed, but only abnormal results are displayed) Labs Reviewed  COMPREHENSIVE METABOLIC PANEL - Abnormal; Notable for the following components:      Result Value   Glucose, Bld 127 (*)    All other components within normal limits  CBG MONITORING, ED - Abnormal; Notable for the following components:   Glucose-Capillary 113 (*)     All other components within normal limits  CBC WITH DIFFERENTIAL/PLATELET  MAGNESIUM  CBC  BASIC METABOLIC PANEL    EKG EKG Interpretation  Date/Time:  Sunday December 24 2022 18:25:52 EST Ventricular Rate:  72 PR Interval:  134 QRS Duration: 108 QT Interval:  435 QTC Calculation: 477 R Axis:   31 Text Interpretation: Sinus rhythm Ventricular premature complex Probable left atrial enlargement Incomplete left bundle branch block Borderline prolonged QT interval Confirmed by Sherwood Gambler [phone]) on 12/24/2022 6:27:33 PM  Radiology DG Chest Portable 1 View  Result Date: 12/24/2022 CLINICAL DATA:  Syncope EXAM: PORTABLE CHEST 1 VIEW COMPARISON:  Chest x-ray 12/07/2022 FINDINGS: The heart size and mediastinal contours are within normal limits. Both lungs are clear. Again seen is scoliosis of the thoracic spine. No acute fractures are seen. IMPRESSION: No active disease. Electronically Signed   By: Ronney Asters M.D.   On: 12/24/2022 19:06    Procedures Procedures    Medications Ordered in ED Medications  enoxaparin (LOVENOX) injection 40 mg (has no administration in time range)  multivitamin with minerals tablet 1 tablet (has no administration in time range)  rosuvastatin (CRESTOR) tablet 20 mg (has no administration in time range)  tamsulosin (FLOMAX) capsule 0.4 mg (has no administration in time range)  cholecalciferol (VITAMIN D3) 25 MCG (1000 UNIT) tablet 1,000 Units (has no administration in time range)  lactated ringers infusion (has no administration in time range)  acetaminophen (TYLENOL) tablet 650 mg (has no administration in time range)  prochlorperazine (COMPAZINE) injection 5 mg (has no administration in time range)  polyethylene glycol (MIRALAX / GLYCOLAX) packet 17 g (has no administration in time range)  melatonin tablet 3 mg (has no administration in time range)  sodium chloride 0.9 % bolus 1,000 mL (1,000 mLs Intravenous New Bag/Given 12/24/22 1932)    ED  Course/ Medical Decision Making/ A&P                             Medical Decision Making Amount and/or Complexity of Data Reviewed Labs: ordered.    Details: No anemia or significant electrolyte disturbance Radiology: ordered.    Details: No CHF ECG/medicine tests: ordered and independent interpretation performed.    Details: Sinus rhythm  Risk Decision regarding hospitalization.   Patient presents with syncope.  No clear cause.  However given his seem to occur at rest I am concerned about a potential cardiac source.  I reviewed cardiac monitoring and he has primarily sinus rhythm though he does have at least 1 episode of a one beat pause and then has had fairly common PVCs without ventricular tachycardia.  I think he will need observation with telemetry. Discussed with Dr. [place] Crane for admission.  After I talked to patient about workup/plan, he notes his right ankle is now feeling sore and he thinks he injured it. Has some mild right lateral ankle tenderness. Will order an Xray.        Final Clinical Impression(s) / ED Diagnoses Final diagnoses:  Syncope, unspecified syncope type    Rx / DC Orders ED Discharge Orders     None         Sherwood Gambler, MD 12/24/22 2102

## 2022-12-24 NOTE — ED Notes (Signed)
Radiology at bedside for xray at this time.

## 2022-12-25 ENCOUNTER — Ambulatory Visit
Admission: RE | Admit: 2022-12-25 | Discharge: 2022-12-25 | Disposition: A | Discharge status: Home / Self Care | Payer: Medicare Other | Source: Ambulatory Visit | Attending: Radiation Oncology | Admitting: Radiation Oncology

## 2022-12-25 ENCOUNTER — Inpatient Hospital Stay: Payer: Medicare Other

## 2022-12-25 ENCOUNTER — Observation Stay (HOSPITAL_BASED_OUTPATIENT_CLINIC_OR_DEPARTMENT_OTHER): Payer: Medicare Other

## 2022-12-25 ENCOUNTER — Other Ambulatory Visit: Payer: Self-pay

## 2022-12-25 DIAGNOSIS — S82831A Other fracture of upper and lower end of right fibula, initial encounter for closed fracture: Secondary | ICD-10-CM

## 2022-12-25 DIAGNOSIS — C3490 Malignant neoplasm of unspecified part of unspecified bronchus or lung: Secondary | ICD-10-CM | POA: Diagnosis present

## 2022-12-25 DIAGNOSIS — Z5111 Encounter for antineoplastic chemotherapy: Secondary | ICD-10-CM | POA: Diagnosis not present

## 2022-12-25 DIAGNOSIS — S82401A Unspecified fracture of shaft of right fibula, initial encounter for closed fracture: Secondary | ICD-10-CM | POA: Diagnosis present

## 2022-12-25 DIAGNOSIS — C3411 Malignant neoplasm of upper lobe, right bronchus or lung: Secondary | ICD-10-CM | POA: Diagnosis not present

## 2022-12-25 DIAGNOSIS — N400 Enlarged prostate without lower urinary tract symptoms: Secondary | ICD-10-CM

## 2022-12-25 DIAGNOSIS — Z510 Encounter for antineoplastic radiation therapy: Secondary | ICD-10-CM | POA: Diagnosis not present

## 2022-12-25 DIAGNOSIS — E782 Mixed hyperlipidemia: Secondary | ICD-10-CM | POA: Diagnosis present

## 2022-12-25 DIAGNOSIS — R55 Syncope and collapse: Secondary | ICD-10-CM | POA: Diagnosis not present

## 2022-12-25 LAB — RAD ONC ARIA SESSION SUMMARY
Course Elapsed Days: 0
Plan Fractions Treated to Date: 1
Plan Prescribed Dose Per Fraction: 2 Gy
Plan Total Fractions Prescribed: 30
Plan Total Prescribed Dose: 60 Gy
Reference Point Dosage Given to Date: 2 Gy
Reference Point Session Dosage Given: 2 Gy
Session Number: 1

## 2022-12-25 LAB — URINALYSIS, ROUTINE W REFLEX MICROSCOPIC
Bilirubin Urine: NEGATIVE
Glucose, UA: NEGATIVE mg/dL
Hgb urine dipstick: NEGATIVE
Ketones, ur: 5 mg/dL — AB
Leukocytes,Ua: NEGATIVE
Nitrite: NEGATIVE
Protein, ur: NEGATIVE mg/dL
Specific Gravity, Urine: 1.008 (ref 1.005–1.030)
pH: 7 (ref 5.0–8.0)

## 2022-12-25 LAB — BASIC METABOLIC PANEL
Anion gap: 11 (ref 5–15)
BUN: 25 mg/dL — ABNORMAL HIGH (ref 8–23)
CO2: 24 mmol/L (ref 22–32)
Calcium: 9.3 mg/dL (ref 8.9–10.3)
Chloride: 106 mmol/L (ref 98–111)
Creatinine, Ser: 1.07 mg/dL (ref 0.61–1.24)
GFR, Estimated: >60 mL/min (ref >60)
Glucose, Bld: 97 mg/dL (ref 70–99)
Potassium: 3.7 mmol/L (ref 3.5–5.1)
Sodium: 141 mmol/L (ref 135–145)

## 2022-12-25 LAB — CBC
HCT: 46 % (ref 39.0–52.0)
Hemoglobin: 15.4 g/dL (ref 13.0–17.0)
MCH: 29.1 pg (ref 26.0–34.0)
MCHC: 33.5 g/dL (ref 30.0–36.0)
MCV: 87 fL (ref 80.0–100.0)
Platelets: 210 10*3/uL (ref 150–400)
RBC: 5.29 MIL/uL (ref 4.22–5.81)
RDW: 12.6 % (ref 11.5–15.5)
WBC: 9.1 10*3/uL (ref 4.0–10.5)
nRBC: 0 % (ref 0.0–0.2)

## 2022-12-25 LAB — ECHOCARDIOGRAM COMPLETE
Area-P 1/2: 3.31 cm2
Calc EF: 56.6 %
Height: 65 in
P 1/2 time: 577 msec
S' Lateral: 2.7 cm
Single Plane A2C EF: 59.1 %
Single Plane A4C EF: 55.1 %
Weight: 2356.28 oz

## 2022-12-25 NOTE — Progress Notes (Signed)
Discharge teaching completed with patient and son at bedside. Patient expressed no further questions about plan of outpatient care at this time. Patient transported down with NT via wheelchair to discharge home with patient's son.

## 2022-12-25 NOTE — Discharge Summary (Signed)
Physician Discharge Summary   Patient: Alan Mendez MRN: [id number] DOB: Mar 15, 1941  Admit date:     12/24/2022  Discharge date: {dischdate:26783}  Discharge Physician: Alan Mendez   PCP: Alan Pao, MD   Recommendations at discharge:  {Tip this will not be part of the note when signed- Example include specific recommendations for outpatient follow-up, pending tests to follow-up on. (Optional):26781}  ***  Discharge Diagnoses: Principal Problem:   Syncope Active Problems:   Squamous cell lung cancer (HCC)   Closed right fibular fracture   Mixed hyperlipidemia   BPH (benign prostatic hyperplasia)  Resolved Problems:   * No resolved hospital problems. *   Hospital Course: 82 y.o. male with medical history significant for newly diagnosed lung cancer, stage III non-small cell lung carcinoma (Squamous Cell, follows with Alan Mendez), with plan for first dose of chemoradiation 12/25/22, BPH, hyperlipidemia, who presented to [facility] from [facility] via EMS due to 2 witnessed syncopal episodes prior to presenting to the ED.   Upon evaluation in the emergency department due to multiple episodes of witnessed syncope hospitalist group was called to assess the patient for admission the hospital for syncope workup.  Shortly after admission, due to complaints of right foot and ankle pain status post fall and x-rays performed revealing a mildly displaced fracture of the distal fibula.  Case was discussed with Alan Mendez with orthopedic surgery who recommended no surgical intervention and placement of a cam walker boot with outpatient follow-up.    {Tip this will not be part of the note when signed Body mass index is 24.51 kg/m. , ,  (Optional):26781}  {(NOTE) Pain control PDMP Statment (Optional):26782}  Consultants: *** Procedures performed: ***  Disposition: {Plan; Disposition:26390} Diet recommendation:  Discharge Diet Orders (From admission, onward)     Start     Ordered    12/25/22 0000  Diet - low sodium heart healthy        12/25/22 1606           {Diet_Plan:26776}  DISCHARGE MEDICATION: Allergies as of 12/25/2022       Reactions   Lamisil [terbinafine]    Tongue swelling        Medication List     TAKE these medications    cholecalciferol 25 MCG (1000 UNIT) tablet Commonly known as: VITAMIN D3 Take 1,000 Units by mouth daily.   multivitamin with minerals tablet Take 1 tablet by mouth daily. Centrum   rosuvastatin 10 MG tablet Commonly known as: CRESTOR TAKE 1 TABLET EACH DAY. What changed: See the new instructions.   tamsulosin 0.4 MG Caps capsule Commonly known as: FLOMAX Take 1 capsule (0.4 mg total) by mouth daily. What changed: when to take this               Durable Medical Equipment  (From admission, onward)           Start     Ordered   12/25/22 1211  For home use only DME 3 n 1  Once        12/25/22 1212   12/25/22 1133  For home use only DME Walker rolling  Once       Question Answer Comment  Walker: With [place]   Patient needs a walker to treat with the following condition Difficulty in walking, not elsewhere classified      12/25/22 1132            Follow-up Information     [place],  Alan Key, MD. Schedule an appointment as soon as possible for a visit in 1 week(s).   Specialty: Orthopedic Surgery Contact information: [address] [place] [address] [phone]         Alan Bears, MD. Go on 01/02/2023.   Specialty: Oncology Contact information: [place] [address] [phone]                 Discharge Exam: Alan Mendez Weights   12/24/22 1826 12/24/22 2155  Weight: 66.2 kg 66.8 kg   *** Constitutional: Awake alert and oriented x3, no associated distress.   Respiratory: clear to auscultation bilaterally, no wheezing, no crackles. Normal respiratory effort. No accessory muscle use.  Cardiovascular: Regular rate and  rhythm, no murmurs / rubs / gallops. No extremity edema. 2+ pedal pulses. No carotid bruits.  Abdomen: Abdomen is soft and nontender.  No evidence of intra-abdominal masses.  Positive bowel sounds noted in all quadrants.   Musculoskeletal: No joint deformity upper and lower extremities. Good ROM, no contractures. Normal muscle tone.     Condition at discharge: {DC Condition:26389}  The results of significant diagnostics from this hospitalization (including imaging, microbiology, ancillary and laboratory) are listed below for reference.   Imaging Studies: ECHOCARDIOGRAM COMPLETE  Result Date: 12/25/2022    ECHOCARDIOGRAM REPORT   Patient Name:   Alan Mendez Date of Exam: 12/25/2022 Medical Rec #:  [id number]      Height:       65.0 in Accession #:    [id number]     Weight:       147.3 lb Date of Birth:  05-19-1941       BSA:          1.737 m Patient Age:    64 years       BP:           139/74 mmHg Patient Gender: M              HR:           66 bpm. Exam Location:  Inpatient Procedure: 2D Echo, Cardiac Doppler and Color Doppler Indications:    syncope  History:        Patient has no prior history of Echocardiogram examinations.                 Risk Factors:Dyslipidemia.  Sonographer:    Alan Mendez Referring Phys: Alan Mendez  1. Left ventricular ejection fraction, by estimation, is 60 to 65%. The left ventricle has normal function. The left ventricle has no regional wall motion abnormalities. Left ventricular diastolic parameters were normal.  2. Right ventricular systolic function is normal. The right ventricular size is normal. There is normal pulmonary artery systolic pressure.  3. The mitral valve is normal in structure. Trivial mitral valve regurgitation. No evidence of mitral stenosis.  4. The aortic valve is tricuspid. There is mild calcification of the aortic valve. There is mild thickening of the aortic valve. Aortic valve regurgitation is trivial.  5. The inferior vena  cava is normal in size with greater than 50% respiratory variability, suggesting right atrial pressure of 3 mmHg. Comparison(s): No prior Echocardiogram. Conclusion(s)/Recommendation(s): Normal biventricular function without evidence of hemodynamically significant valvular heart disease. FINDINGS  Left Ventricle: Left ventricular ejection fraction, by estimation, is 60 to 65%. The left ventricle has normal function. The left ventricle has no regional wall motion abnormalities. The left ventricular internal cavity size was normal in size. There  is  borderline left ventricular hypertrophy. Left ventricular diastolic parameters were normal. Right Ventricle: The right ventricular size is normal. Right vetricular wall thickness was not well visualized. Right ventricular systolic function is normal. There is normal pulmonary artery systolic pressure. The tricuspid regurgitant velocity is 2.52 m/s, and with an assumed right atrial pressure of 3 mmHg, the estimated right ventricular systolic pressure is 18.2 mmHg. Left Atrium: Left atrial size was normal in size. Right Atrium: Right atrial size was normal in size. Pericardium: There is no evidence of pericardial effusion. Mitral Valve: The mitral valve is normal in structure. Trivial mitral valve regurgitation. No evidence of mitral valve stenosis. Tricuspid Valve: The tricuspid valve is normal in structure. Tricuspid valve regurgitation is mild . No evidence of tricuspid stenosis. Aortic Valve: The aortic valve is tricuspid. There is mild calcification of the aortic valve. There is mild thickening of the aortic valve. Aortic valve regurgitation is trivial. Aortic regurgitation PHT measures 577 msec. Pulmonic Valve: The pulmonic valve was grossly normal. Pulmonic valve regurgitation is trivial. No evidence of pulmonic stenosis. Aorta: The aortic root, ascending aorta, aortic arch and descending aorta are all structurally normal, with no evidence of dilitation or  obstruction. Venous: The inferior vena cava is normal in size with greater than 50% respiratory variability, suggesting right atrial pressure of 3 mmHg. IAS/Shunts: The atrial septum is grossly normal.  LEFT VENTRICLE PLAX 2D LVIDd:         4.20 cm     Diastology LVIDs:         2.70 cm     LV e' medial:    7.83 cm/s LV PW:         1.10 cm     LV E/e' medial:  7.9 LV IVS:        1.20 cm     LV e' lateral:   8.93 cm/s LVOT diam:     1.90 cm     LV E/e' lateral: 7.0 LV SV:         47 LV SV Index:   27 LVOT Area:     2.84 cm  LV Volumes (MOD) LV vol d, MOD A2C: 99.0 ml LV vol d, MOD A4C: 85.9 ml LV vol s, MOD A2C: 40.5 ml LV vol s, MOD A4C: 38.6 ml LV SV MOD A2C:     58.5 ml LV SV MOD A4C:     85.9 ml LV SV MOD BP:      52.4 ml RIGHT VENTRICLE             IVC RV Basal diam:  3.90 cm     IVC diam: 1.80 cm RV S prime:     16.40 cm/s TAPSE (M-mode): 2.1 cm LEFT ATRIUM             Index        RIGHT ATRIUM           Index LA diam:        3.20 cm 1.84 cm/m   RA Area:     13.50 cm LA Vol (A2C):   51.1 ml 29.42 ml/m  RA Volume:   28.90 ml  16.64 ml/m LA Vol (A4C):   37.0 ml 21.30 ml/m LA Biplane Vol: 43.3 ml 24.93 ml/m  AORTIC VALVE             PULMONIC VALVE LVOT Vmax:   79.20 cm/s  PR End Diast Vel: 1.63 msec LVOT Vmean:  52.800 cm/s LVOT VTI:  0.165 m AI PHT:      577 msec  AORTA Ao Root diam: 3.10 cm Ao Asc diam:  3.00 cm MITRAL VALVE               TRICUSPID VALVE MV Area (PHT): 3.31 cm    TR Peak grad:   25.4 mmHg MV Decel Time: 229 msec    TR Vmax:        252.00 cm/s MV E velocity: 62.10 cm/s MV A velocity: 72.00 cm/s  SHUNTS MV E/A ratio:  0.86        Systemic VTI:  0.16 m                            Systemic Diam: 1.90 cm Buford Dresser MD Electronically signed by Buford Dresser MD Signature Date/Time: 12/25/2022/3:46:29 PM    Final    DG Ankle Complete Right  Result Date: 12/24/2022 CLINICAL DATA:  Trauma to the lateral right ankle. EXAM: RIGHT ANKLE - COMPLETE 3+ VIEW COMPARISON:  None  Available. FINDINGS: Mildly displaced fracture of the distal fibula with approximately 4 mm lateral displacement of the distal fracture fragment. No other acute fracture. There is no dislocation. The ankle mortise is intact. Mild soft tissue swelling over the lateral malleolus. No acute foreign object or soft tissue gas. IMPRESSION: Mildly displaced fracture of the distal fibula. Electronically Signed   By: Anner Crete M.D.   On: 12/24/2022 21:12   DG Chest Portable 1 View  Result Date: 12/24/2022 CLINICAL DATA:  Syncope EXAM: PORTABLE CHEST 1 VIEW COMPARISON:  Chest x-ray 12/07/2022 FINDINGS: The heart size and mediastinal contours are within normal limits. Both lungs are clear. Again seen is scoliosis of the thoracic spine. No acute fractures are seen. IMPRESSION: No active disease. Electronically Signed   By: Ronney Asters M.D.   On: 12/24/2022 19:06   NM PET Image Initial (PI) Skull Base To Thigh (F-18 FDG)  Result Date: 12/15/2022 CLINICAL DATA:  Initial treatment strategy for lung cancer. Bronchoscopy 12/07/2022 documented non-small-cell carcinoma in a right upper lobe pulmonary lesion. Patient with personal history of urothelial malignancy status post right nephro ureterectomy and history of bladder cancer. EXAM: NUCLEAR MEDICINE PET SKULL BASE TO THIGH TECHNIQUE: 7.3 mCi F-18 FDG was injected intravenously. Full-ring PET imaging was performed from the skull base to thigh after the radiotracer. CT data was obtained and used for attenuation correction and anatomic localization. Fasting blood glucose: 90 mg/dl COMPARISON:  Chest, abdomen and pelvis CT 11/22/2022 FINDINGS: Mediastinal blood pool activity: SUV max 2.6 Liver activity: SUV max NA NECK: No hypermetabolic lymph nodes in the neck. Incidental CT findings: None. CHEST: 13 mm left thyroid nodule is hypermetabolic with SUV max = 03.0 19 mm nodule medial right upper lobe on 55/4 is markedly hypermetabolic with SUV max = 9.3. A second paraspinal  right upper lobe nodule is identified slightly more inferiorly measuring 18 mm on 62/4. This is also hypermetabolic with SUV max = 09.2. 21 mm short axis retrotracheal node on 33/0 is hypermetabolic with SUV max = 5.8. No hypermetabolic axillary or left hilar lymphadenopathy. No additional hypermetabolic pulmonary nodule or mass lesion evident. Incidental CT findings: There is mild atherosclerotic calcification of the abdominal aorta without aneurysm. Coronary artery calcification is evident. No pulmonary edema or pleural effusion. ABDOMEN/PELVIS: No abnormal hypermetabolic activity within the liver, pancreas, adrenal glands, or spleen. No hypermetabolic lymph nodes in the abdomen or pelvis. Incidental CT findings: 8  mm low-density lesion in the medial segment left liver shows no hypermetabolism, compatible with a cyst. No followup imaging is recommended. 13 mm calcified gallstone evident. Right kidney surgically absent. 12 mm water is the lesion upper pole left kidney is compatible with a cyst. No followup imaging is recommended. There is mild atherosclerotic calcification of the abdominal aorta without aneurysm. Diverticular disease noted left colon without diverticulitis. Prostate gland is enlarged. SKELETON: No focal hypermetabolic activity to suggest skeletal metastasis. Incidental CT findings: Thoracolumbar scoliosis evident. IMPRESSION: 1. Two hypermetabolic right upper lobe pulmonary nodules, consistent with neoplasm. 2. Hypermetabolic retrotracheal lymph node, consistent with metastatic disease. 3. Hypermetabolic 13 mm left thyroid nodule. Recommend thyroid US and biopsy. (Ref: J Am Coll Radiol. 2015 Feb;12(2): 143-50). 4. No evidence for hypermetabolic metastatic disease in the abdomen or pelvis. 5. Status post right nephroureterectomy. 6. Cholelithiasis. 7. Left colonic diverticulosis without diverticulitis. 8. Prostatomegaly. 9.  Aortic Atherosclerosis (ICD10-I70.0). Electronically Signed   By: Misty Stanley M.D.   On: 12/15/2022 10:20   MR BRAIN W WO CONTRAST  Result Date: 12/12/2022 CLINICAL DATA:  Non-small cell lung cancer, staging EXAM: MRI HEAD WITHOUT AND WITH CONTRAST TECHNIQUE: Multiplanar, multiecho pulse sequences of the brain and surrounding structures were obtained without and with intravenous contrast. CONTRAST:  58mL GADAVIST GADOBUTROL 1 MMOL/ML IV SOLN COMPARISON:  No prior MRI available FINDINGS: Brain: No restricted diffusion to suggest acute or subacute infarct. No abnormal parenchymal or meningeal enhancement. No acute hemorrhage, mass, mass effect, or midline shift. No hydrocephalus or extra-axial collection. Normal pituitary and craniocervical junction. No hemosiderin deposition to suggest remote hemorrhage. Scattered T2 hyperintense signal in the periventricular white matter, likely the sequela of minimal chronic small vessel ischemic disease. Vascular: Patent arterial flow voids. Normal arterial and venous enhancement. Skull and upper cervical spine: 3 mm enhancing focus in the head of the left mandible (series 16, image 40). Otherwise normal marrow signal. T2 hyperintense lesion in the right suboccipital soft tissues (series 9, image 4), which is T1 hypointense with minimal focal enhancement, measuring up to 1.2 x 1.1 x 1.3 cm (AP x TR x CC) (series 16, image 23 and series 17, image 7). Sinuses/Orbits: Clear paranasal sinuses. No acute finding in the orbits. Status post bilateral lens replacements. Other: The mastoid air cells are well aerated. IMPRESSION: 1. No acute intracranial process. No evidence of metastatic disease in the brain. 2. 3 mm enhancing focus in the head of the left mandible, which is indeterminate but concerning for a small focus of metastatic disease given the patient's history. 3. T2 hyperintense lesion in the right suboccipital soft tissues, which is T1 hypointense with minimal focal enhancement, measuring up to 1.3 cm, which is also indeterminate.  Electronically Signed   By: Merilyn Baba M.D.   On: 12/12/2022 17:18   DG CHEST PORT 1 VIEW  Result Date: 12/07/2022 CLINICAL DATA:  Post bronchoscopy EXAM: PORTABLE CHEST 1 VIEW COMPARISON:  Portable exam 1046 hours compared to CT exam of 11/22/2022 FINDINGS: Normal heart size, mediastinal contours, and pulmonary vascularity. Atherosclerotic calcification aorta. Linear subsegmental atelectasis medial LEFT lower lobe. RIGHT lung nodules/masses identified on prior CT are poorly demonstrated on current radiographic exam. No acute infiltrate, pleural effusion, or pneumothorax. Bones demineralized with biconvex scoliosis. IMPRESSION: No pneumothorax. Streaky atelectasis medial LEFT lower lobe. Previously identified RIGHT lung nodules/masses poorly visualized. Electronically Signed   By: Lavonia Dana M.D.   On: 12/07/2022 11:00   DG C-ARM BRONCHOSCOPY  Result Date: 12/07/2022 C-ARM BRONCHOSCOPY:  Fluoroscopy was utilized by the requesting physician.  No radiographic interpretation.    Microbiology: Results for orders placed or performed during the hospital encounter of 12/07/22  Resp panel by RT-PCR (RSV, Flu A&B, Covid) Anterior Nasal Swab     Status: None   Collection Time: 12/07/22  6:53 AM   Specimen: Anterior Nasal Swab  Result Value Ref Range Status   SARS Coronavirus 2 by RT PCR NEGATIVE NEGATIVE Final    Comment: (NOTE) SARS-CoV-2 target nucleic acids are NOT DETECTED.  The SARS-CoV-2 RNA is generally detectable in upper respiratory specimens during the acute phase of infection. The lowest concentration of SARS-CoV-2 viral copies this assay can detect is 138 copies/mL. A negative result does not preclude SARS-Cov-2 infection and should not be used as the sole basis for treatment or other patient management decisions. A negative result may occur with  improper specimen collection/handling, submission of specimen other than nasopharyngeal swab, presence of viral mutation(s) within  the areas targeted by this assay, and inadequate number of viral copies(<138 copies/mL). A negative result must be combined with clinical observations, patient history, and epidemiological information. The expected result is Negative.  Fact Sheet for Patients:  EntrepreneurPulse.com.au  Fact Sheet for Healthcare Providers:  IncredibleEmployment.be  This test is no t yet approved or cleared by the [place] FDA and  has been authorized for detection and/or diagnosis of SARS-CoV-2 by FDA under an Emergency Use Authorization (EUA). This EUA will remain  in effect (meaning this test can be used) for the duration of the COVID-19 declaration under Section 564(b)(1) of the Act, 21 U.S.C.section 360bbb-3(b)(1), unless the authorization is terminated  or revoked sooner.       Influenza A by PCR NEGATIVE NEGATIVE Final   Influenza B by PCR NEGATIVE NEGATIVE Final    Comment: (NOTE) The Xpert Xpress SARS-CoV-2/FLU/RSV plus assay is intended as an aid in the diagnosis of influenza from Nasopharyngeal swab specimens and should not be used as a sole basis for treatment. Nasal washings and aspirates are unacceptable for Xpert Xpress SARS-CoV-2/FLU/RSV testing.  Fact Sheet for Patients: EntrepreneurPulse.com.au  Fact Sheet for Healthcare Providers: IncredibleEmployment.be  This test is not yet approved or cleared by the [place] FDA and has been authorized for detection and/or diagnosis of SARS-CoV-2 by FDA under an Emergency Use Authorization (EUA). This EUA will remain in effect (meaning this test can be used) for the duration of the COVID-19 declaration under Section 564(b)(1) of the Act, 21 U.S.C. section 360bbb-3(b)(1), unless the authorization is terminated or revoked.     Resp Syncytial Virus by PCR NEGATIVE NEGATIVE Final    Comment: (NOTE) Fact Sheet for  Patients: EntrepreneurPulse.com.au  Fact Sheet for Healthcare Providers: IncredibleEmployment.be  This test is not yet approved or cleared by the [place] FDA and has been authorized for detection and/or diagnosis of SARS-CoV-2 by FDA under an Emergency Use Authorization (EUA). This EUA will remain in effect (meaning this test can be used) for the duration of the COVID-19 declaration under Section 564(b)(1) of the Act, 21 U.S.C. section 360bbb-3(b)(1), unless the authorization is terminated or revoked.  Performed at [facility], [place] [address] [place], [place] [address]     Labs: CBC: Recent Labs  Lab 12/24/22 1849 12/25/22 0715  WBC 5.3 9.1  NEUTROABS 3.5  --   HGB 15.1 15.4  HCT 44.6 46.0  MCV 86.3 87.0  PLT 179 017   Basic Metabolic Panel: Recent Labs  Lab 12/24/22 1849 12/25/22 0457  NA 139 141  K 3.6 3.7  CL 103 106  CO2 23 24  GLUCOSE 127* 97  BUN 23 25*  CREATININE 1.17 1.07  CALCIUM 9.2 9.3  MG 2.1  --    Liver Function Tests: Recent Labs  Lab 12/24/22 1849  AST 30  ALT 19  ALKPHOS 56  BILITOT 1.1  PROT 7.2  ALBUMIN 3.9   CBG: Recent Labs  Lab 12/24/22 1822  GLUCAP 113*    Discharge time spent: {LESS THAN/GREATER TCYE:18590} 30 minutes.  Signed: Vernelle Emerald, MD [facility] 12/25/2022

## 2022-12-25 NOTE — Discharge Instructions (Addendum)
You have experienced a right fibular fracture after your fall.  Please wear the provided boot as much as possible.  You may remove this boot while showering while you sit in a shower chair. Please call [facility] to make a follow-up appointment with Dr. Alvan Dame or one of his partners in the next 1 week Please perform limited weightbearing on your right leg until you are otherwise cleared by orthopedic surgeon upon follow-up. Please follow-up with your oncologist Dr. Earlie Server on 2/20.  You may call his clinic and see if he would like you to follow-up sooner.  Please continue radiation treatments as previously scheduled Please return to the emergency department if you develop further episodes of loss of consciousness, weakness, fevers, chest pain or inability to tolerate oral intake.

## 2022-12-25 NOTE — Plan of Care (Signed)

## 2022-12-25 NOTE — Evaluation (Signed)
Occupational Therapy Evaluation Patient Details Name: Alan Mendez MRN: [id number] DOB: Sep 28, 1941 Today's Date: 12/25/2022   History of Present Illness Alan Mendez is a 82 y.o. male with medical history significant for newly diagnosed lung cancer, stage III non-small cell lung carcinoma, with plan for first dose of chemoradiation  12/25/22, BPH, hyperlipidemia, who presented to [facility] ED 12/24/22  via EMS due to 2 syncopal episodes prior to presenting to the ED.Xray  of R ankle xray returned showing mildly displaced fracture of the distal fibula, CAM boot ordered..   Clinical Impression   Patient is currently requiring assistance with ADLs including up to [place] guard to [place] assist with Lower body ADLs and instruction and cues for , setup assist with Upper body ADLs,  as well as supervision assist with functional transfers to toilet with need of sequencing cues.  Current level of function is below patient's typical baseline.  During this evaluation, patient was limited by generalized weakness, impaired activity tolerance, and pain, all of which has the potential to impact patient's safety and independence during functional mobility, as well as performance for ADLs.  Patient lives alone at home but has 2 sons, who are able to provide 24/7 supervision and assistance until pt's fiance arrives on Friday of this week.  Patient demonstrates good rehab potential, and should benefit from continued skilled occupational therapy services while in acute care to maximize safety, independence and quality of life at home.   ?    Recommendations for follow up therapy are one component of a multi-disciplinary discharge planning process, led by the attending physician.  Recommendations may be updated based on patient status, additional functional criteria and insurance authorization.   Follow Up Recommendations  No OT follow up     Assistance Recommended at Discharge PRN  Patient can return home with the following A  little help with bathing/dressing/bathroom;Assist for transportation    Functional Status Assessment  Patient has had a recent decline in their functional status and demonstrates the ability to make significant improvements in function in a reasonable and predictable amount of time.  Equipment Recommendations  BSC/3in1    Recommendations for Other Services       Precautions / Restrictions Precautions Precautions: Fall Required Braces or Orthoses: Splint/Cast Splint/Cast: Right CAM boot Restrictions Weight Bearing Restrictions: No Other Position/Activity Restrictions: no WB restrictions indicated      Mobility Bed Mobility               General bed mobility comments: In recliner for OT    Transfers                          Balance Overall balance assessment: Needs assistance Sitting-balance support: No upper extremity supported, Feet supported Sitting balance-Leahy Scale: Good     Standing balance support: During functional activity, No upper extremity supported Standing balance-Leahy Scale: Fair                             ADL either performed or assessed with clinical judgement   ADL Overall ADL's : Needs assistance/impaired Eating/Feeding: Independent   Grooming: Set up;Sitting   Upper Body Bathing: Set up;Sitting   Lower Body Bathing: Supervison/ safety;Sitting/lateral leans   Upper Body Dressing : Set up;Sitting   Lower Body Dressing: Set up;Cueing for sequencing;Cueing for compensatory techniques;Supervision/safety Lower Body Dressing Details (indicate cue type and reason): Pt trained on doffing and donning  CAM boot, including sequencing, tabbing straps for ease and having heel all the way back in shoe for proper support. With increased time/effort, pt able to lean forward from recliner level seat and doff/don CAM boot with verbal cues. Toilet Transfer: Supervision/safety;Rolling walker (2 wheels);Ambulation;Cueing for  sequencing;Cueing for safety Toilet Transfer Details (indicate cue type and reason): Pt stood from recliner with supervision. Pt educated on sequencing to help compensate for RT ankle pain. Pt performed step-to gait with intermittent cues to correct squence and for walker proximity.  Pt took ~10 forward steps, turned then 8 steps and 2 back steps with cues for sequencing steps. Lowered to recliner with supervision, cues for UE reach back. Toileting- Clothing Manipulation and Hygiene: Set up;Supervision/safety       Functional mobility during ADLs: Supervision/safety;Rolling walker (2 wheels) General ADL Comments: CAM boot     Vision   Vision Assessment?: No apparent visual deficits     Perception     Praxis      Pertinent Vitals/Pain Pain Assessment Pain Assessment: (P) 0-10 Pain Score: 6  Pain Location: right ankle and bilateral knees with weight bearing. Pain Descriptors / Indicators: Sore Pain Intervention(s): Limited activity within patient's tolerance, Monitored during session     Hand Dominance Left   Extremity/Trunk Assessment Upper Extremity Assessment Upper Extremity Assessment: Overall WFL for tasks assessed   Lower Extremity Assessment Lower Extremity Assessment: Overall WFL for tasks assessed RLE Deficits / Details: limited ankle ROM due to pain, CAM boot applied   Cervical / Trunk Assessment Cervical / Trunk Assessment: Normal   Communication Communication Communication: No difficulties   Cognition Arousal/Alertness: Awake/alert Behavior During Therapy: WFL for tasks assessed/performed Overall Cognitive Status: Within Functional Limits for tasks assessed                                 General Comments: Ox4     General Comments       Exercises     Shoulder Instructions      Home Living Family/patient expects to be discharged to:: Private residence Living Arrangements: Alone Available Help at Discharge: Family;Available 24  hours/day;Friend(s) Type of Home: House Home Access: Level entry     Home Layout: One level     Bathroom Shower/Tub: Occupational psychologist: Standard     Home Equipment: None   Additional Comments: walking pole      Prior Functioning/Environment Prior Level of Function : Independent/Modified Independent             Mobility Comments: very acitve          OT Problem List: Pain;Decreased activity tolerance;Impaired balance (sitting and/or standing)      OT Treatment/Interventions: Self-care/ADL training;Therapeutic activities;DME and/or AE instruction;Patient/family education    OT Goals(Current goals can be found in the care plan section) Acute Rehab OT Goals Patient Stated Goal: Help determining seating system for shower. OT Goal Formulation: With patient/family Time For Goal Achievement: 01/08/23 Potential to Achieve Goals: Good ADL Goals Pt Will Perform Lower Body Dressing: with modified independence (Including CAM boot) Pt Will Transfer to Toilet: with modified independence;ambulating Pt Will Perform Toileting - Clothing Manipulation and hygiene: with modified independence Pt Will Perform Tub/Shower Transfer: with modified independence;ambulating  OT Frequency: Min 2X/week (Do not anticipate more than 1-2 sessions for need)    Co-evaluation              AM-PAC OT "6 Clicks"  Daily Activity     Outcome Measure Help from another person eating meals?: None Help from another person taking care of personal grooming?: None Help from another person toileting, which includes using toliet, bedpan, or urinal?: A Little Help from another person bathing (including washing, rinsing, drying)?: A Little Help from another person to put on and taking off regular upper body clothing?: None Help from another person to put on and taking off regular lower body clothing?: A Little 6 Click Score: 21   End of Session Equipment Utilized During Treatment: Rolling  walker (2 wheels)  Activity Tolerance: Patient limited by pain Patient left: in chair;with call bell/phone within reach;with family/visitor present  OT Visit Diagnosis: Pain;Dizziness and giddiness (R42) Pain - Right/Left: Right Pain - part of body: Ankle and joints of foot                Time: 1135-1204 OT Time Calculation (min): 29 min Charges:  OT General Charges $OT Visit: 1 Visit OT Evaluation $OT Eval Low Complexity: 1 Low OT Treatments $Self Care/Home Management : 8-22 mins  Anderson Malta, OT Acute Rehab Services Office: [phone] 12/25/2022  Julien Girt 12/25/2022, 2:03 PM

## 2022-12-25 NOTE — TOC Initial Note (Signed)
Transition of Care [facility]) - Initial/Assessment Note    Patient Details  Name: Alan Mendez MRN: [id number] Date of Birth: [birth year]  [facility] CM/SW Contact:    Vassie Moselle, LCSW Phone Number: 12/25/2022, 3:08 PM  Clinical Narrative:                 Met with pt who shares he currently resides in apartment with s/o. Pt declines having shower chair ordered but, is agreeable to have RW and BSC ordered and delivered to room.  DME has been ordered through [employer] and will be delivered to pt's room prior to discharge.   Expected Discharge Plan: Home/Self Care Barriers to Discharge: No Barriers Identified   Patient Goals and CMS Choice Patient states their goals for this hospitalization and ongoing recovery are:: To return home CMS Medicare.gov Compare Post Acute Care list provided to:: Patient Choice offered to / list presented to : Patient      Expected Discharge Plan and Services In-house Referral: Clinical Social Work Discharge Planning Services: NA Post Acute Care Choice: Durable Medical Equipment Living arrangements for the past 2 months: Apartment                 DME Arranged: Bedside commode, Walker rolling DME Agency: [employer] Date DME Agency Contacted: 12/25/22 Time DME Agency Contacted: [phone] Representative spoke with at DME Agency: Erasmo Downer            Prior Living Arrangements/Services Living arrangements for the past 2 months: Apartment Lives with:: Significant Other Patient language and need for interpreter reviewed:: Yes Do you feel safe going back to the place where you live?: Yes      Need for Family Participation in Patient Care: No (Comment) Care giver support system in place?: No (comment) Current home services: DME Criminal Activity/Legal Involvement Pertinent to Current Situation/Hospitalization: No - Comment as needed  Activities of Daily Living Home Assistive Devices/Equipment: None ADL Screening (condition at time of  admission) Patient's cognitive ability adequate to safely complete daily activities?: No Is the patient deaf or have difficulty hearing?: No Does the patient have difficulty seeing, even when wearing glasses/contacts?: No Does the patient have difficulty concentrating, remembering, or making decisions?: No Patient able to express need for assistance with ADLs?: Yes Does the patient have difficulty dressing or bathing?: No Independently performs ADLs?: Yes (appropriate for developmental age) Does the patient have difficulty walking or climbing stairs?: Yes Weakness of Legs: Right Weakness of Arms/Hands: None  Permission Sought/Granted   Permission granted to share information with : No              Emotional Assessment Appearance:: Appears stated age Attitude/Demeanor/Rapport: Engaged Affect (typically observed): Accepting, Pleasant Orientation: : Oriented to Self, Oriented to Place, Oriented to  Time, Oriented to Situation Alcohol / Substance Use: Not Applicable Psych Involvement: No (comment)  Admission diagnosis:  Syncope [R55] Syncope, unspecified syncope type [R55] Patient Active Problem List   Diagnosis Date Noted   Mixed hyperlipidemia 12/25/2022   Squamous cell lung cancer ([place]) 12/25/2022   Syncope 12/24/2022   Primary squamous cell carcinoma of right lung ([place]) 12/18/2022   Encounter for antineoplastic chemotherapy 12/18/2022   Lung nodules 12/06/2022   Adenopathy 12/06/2022   Bladder cancer ([place]) 03/16/2017   Urothelial carcinoma of kidney, right ([place]) 11/15/2016   Elevated prostate specific antigen (PSA) 04/06/2015   Nephrolithiasis 04/06/2015   Urinary frequency 04/06/2015   BPH (benign prostatic hyperplasia) 09/18/2013   Idiopathic scoliosis 09/18/2013  Diverticulosis 09/03/2012   Renal cyst, acquired 03/28/2012   PCP:  Haywood Pao, MD Pharmacy:   [facility], [place] [address] Phone: [phone] Fax: [phone]     Social Determinants of Health (SDOH) Social History: [place]: No Food Insecurity (12/24/2022)  Housing: Low Risk  (12/24/2022)  Transportation Needs: No Transportation Needs (12/24/2022)  Utilities: Not At Risk (12/24/2022)  Depression (PHQ2-9): Low Risk  (12/19/2022)  Tobacco Use: Medium Risk (12/24/2022)   SDOH Interventions:     Readmission Risk Interventions     No data to display

## 2022-12-25 NOTE — Evaluation (Signed)
Physical Therapy Evaluation Patient Details Name: Alan Mendez MRN: [id number] DOB: Jun 28, 1941 Today's Date: 12/25/2022  History of Present Illness  Alan Mendez is a 82 y.o. male with medical history significant for newly diagnosed lung cancer, stage III non-small cell lung carcinoma, with plan for first dose of chemoradiation  12/25/22, BPH, hyperlipidemia, who presented to [facility] ED 12/24/22  via EMS due to 2 syncopal episodes prior to presenting to the ED.Xray  of R ankle xray returned showing mildly displaced fracture of the distal fibula, CAM boot ordered..  Clinical Impression  Pt admitted with above diagnosis.  Pt currently with functional limitations due to the deficits listed below (see PT Problem List). Pt will benefit from skilled PT to increase their independence and safety with mobility to allow discharge to the venue listed below.   Orthostatic BP taken and placed in doc flowsheets.  Patient ambulated x 60'' with RW, no dizziness. Patient will benefit from  RW use initially as patient reports right  foot/ankle and both knees are in discomfort. Recommended he can wean to a cane  as time passes. Deferred patient question about  follow-up  with orthopedics to MD.   Recommendations for follow up therapy are one component of a multi-disciplinary discharge planning process, led by the attending physician.  Recommendations may be updated based on patient status, additional functional criteria and insurance authorization.  Follow Up Recommendations No PT follow up      Assistance Recommended at Discharge Set up Supervision/Assistance  Patient can return home with the following  Help with stairs or ramp for entrance;Assist for transportation;Assistance with cooking/housework    Equipment Recommendations Rolling walker (2 wheels)  Recommendations for Other Services       Functional Status Assessment Patient has had a recent decline in their functional status and demonstrates the ability  to make significant improvements in function in a reasonable and predictable amount of time.     Precautions / Restrictions Precautions Precautions: Fall Required Braces or Orthoses: Splint/Cast Splint/Cast: Right CAM boot Restrictions Other Position/Activity Restrictions: no WB restrictions indicated      Mobility  Bed Mobility Overal bed mobility: Independent                  Transfers Overall transfer level: Needs assistance Equipment used: Rolling walker (2 wheels) Transfers: Sit to/from Stand Sit to Stand: Supervision                Ambulation/Gait Ambulation/Gait assistance: Supervision Gait Distance (Feet): 60 Feet Assistive device: Rolling walker (2 wheels) Gait Pattern/deviations: Step-to pattern, Decreased stance time - right, Antalgic Gait velocity: decr     General Gait Details: patient ambukating with a step to gait with CAM boot  Stairs            Wheelchair Mobility    Modified Rankin (Stroke Patients Only)       Balance Overall balance assessment: Needs assistance Sitting-balance support: No upper extremity supported, Feet supported Sitting balance-Leahy Scale: Good     Standing balance support: During functional activity, No upper extremity supported Standing balance-Leahy Scale: Fair                               Pertinent Vitals/Pain Pain Assessment Pain Assessment: Faces Faces Pain Scale: Hurts little more Pain Location: right ankle Pain Descriptors / Indicators: Discomfort Pain Intervention(s): Monitored during session (CAM boot)    Home Living Family/patient expects to be discharged  to:: Private residence Living Arrangements: Alone Available Help at Discharge: Family;Available 24 hours/day Type of Home: House Home Access: Level entry       Home Layout: One level Home Equipment: None Additional Comments: walking pole    Prior Function Prior Level of Function : Independent/Modified  Independent             Mobility Comments: very acitve       Hand Dominance        Extremity/Trunk Assessment        Lower Extremity Assessment Lower Extremity Assessment: RLE deficits/detail RLE Deficits / Details: limited ankle ROM due to pain, CAM boot applied    Cervical / Trunk Assessment Cervical / Trunk Assessment: Normal  Communication   Communication: No difficulties  Cognition Arousal/Alertness: Awake/alert Behavior During Therapy: WFL for tasks assessed/performed Overall Cognitive Status: Within Functional Limits for tasks assessed                                          General Comments      Exercises     Assessment/Plan    PT Assessment Patient needs continued PT services  PT Problem List Decreased mobility;Decreased activity tolerance;Decreased knowledge of use of DME       PT Treatment Interventions DME instruction;Therapeutic activities;Gait training;Therapeutic exercise;Patient/family education;Functional mobility training    PT Goals (Current goals can be found in the Care Plan section)  Acute Rehab PT Goals Patient Stated Goal: to go home PT Goal Formulation: With patient/family Time For Goal Achievement: 01/01/23 Potential to Achieve Goals: Good    Frequency Min 4X/week     Co-evaluation               AM-PAC PT "6 Clicks" Mobility  Outcome Measure Help needed turning from your back to your side while in a flat bed without using bedrails?: None Help needed moving from lying on your back to sitting on the side of a flat bed without using bedrails?: None Help needed moving to and from a bed to a chair (including a wheelchair)?: A Little Help needed standing up from a chair using your arms (e.g., wheelchair or bedside chair)?: A Little Help needed to walk in hospital room?: A Little Help needed climbing 3-5 steps with a railing? : A Little 6 Click Score: 20    End of Session Equipment Utilized During  Treatment: Gait belt Activity Tolerance: Patient tolerated treatment well Patient left: in chair;with call bell/phone within reach;with family/visitor present Nurse Communication: Mobility status PT Visit Diagnosis: Unsteadiness on feet (R26.81);Pain Pain - Right/Left: Right Pain - part of body: Ankle and joints of foot    Time: 9169-4503 PT Time Calculation (min) (ACUTE ONLY): 41 min   Charges:   PT Evaluation $PT Eval Low Complexity: 1 Low PT Treatments $Gait Training: 8-22 mins $Self Care/Home Management: [place] Office [phone] Weekend pager-[phone]   Claretha Cooper 12/25/2022, 11:45 AM

## 2022-12-25 NOTE — Progress Notes (Signed)
Orthopedic Tech Progress Note Patient Details:  Alan Mendez 12-18-1940 [id number]  Ortho Devices Type of Ortho Device: CAM walker Ortho Device/Splint Interventions: Ordered, Adjustment   Post Interventions Patient Tolerated: Well Instructions Provided: Care of device, Adjustment of device  [place] [facility] 12/25/2022, 9:56 AM

## 2022-12-25 NOTE — Progress Notes (Signed)
Echocardiogram 2D Echocardiogram has been performed.  Frances Furbish 12/25/2022, 2:30 PM

## 2022-12-25 NOTE — Progress Notes (Signed)
PT Cancellation Note  Patient Details Name: Alan Mendez MRN: [id number] DOB: July 08, 1941   Cancelled Treatment:     Cam boot not delivered yet, ortho tech has been notified per RN.  [place] Office [phone] Weekend [phone]    Claretha Cooper 12/25/2022, 9:17 AM

## 2022-12-25 NOTE — Hospital Course (Signed)
82 y.o. male with medical history significant for newly diagnosed lung cancer, stage III non-small cell lung carcinoma (Squamous Cell, follows with Dr. Julien Nordmann), with plan for first dose of chemoradiation 12/25/22, BPH, hyperlipidemia, who presented to [facility] ED from [place] via EMS due to 2 witnessed syncopal episodes prior to presenting to the ED.   Upon evaluation in the emergency department due to multiple episodes of witnessed syncope hospitalist group was called to assess the patient for admission the hospital for syncope workup.  Shortly after admission, due to complaints of right foot and ankle pain status post fall and x-rays performed revealing a mildly displaced fracture of the distal fibula.  Case was discussed with Dr. Alvan Dame with orthopedic surgery who recommended no surgical intervention and placement of a cam walker boot with outpatient follow-up.

## 2022-12-25 NOTE — Progress Notes (Signed)
Sarah, thanks for seeing him in follow up  Thanks,  [place], DO [facility] 12/25/2022 4:58 PM

## 2022-12-26 ENCOUNTER — Other Ambulatory Visit: Payer: Self-pay

## 2022-12-26 ENCOUNTER — Ambulatory Visit
Admission: RE | Admit: 2022-12-26 | Discharge: 2022-12-26 | Disposition: A | Discharge status: Home / Self Care | Payer: Medicare Other | Source: Ambulatory Visit | Attending: Radiation Oncology | Admitting: Radiation Oncology

## 2022-12-26 DIAGNOSIS — Z510 Encounter for antineoplastic radiation therapy: Secondary | ICD-10-CM | POA: Diagnosis not present

## 2022-12-26 DIAGNOSIS — C3490 Malignant neoplasm of unspecified part of unspecified bronchus or lung: Secondary | ICD-10-CM | POA: Diagnosis not present

## 2022-12-26 DIAGNOSIS — C3411 Malignant neoplasm of upper lobe, right bronchus or lung: Secondary | ICD-10-CM | POA: Diagnosis not present

## 2022-12-26 DIAGNOSIS — Z5111 Encounter for antineoplastic chemotherapy: Secondary | ICD-10-CM | POA: Diagnosis not present

## 2022-12-26 LAB — RAD ONC ARIA SESSION SUMMARY
Course Elapsed Days: 1
Plan Fractions Treated to Date: 2
Plan Prescribed Dose Per Fraction: 2 Gy
Plan Total Fractions Prescribed: 30
Plan Total Prescribed Dose: 60 Gy
Reference Point Dosage Given to Date: 4 Gy
Reference Point Session Dosage Given: 2 Gy
Session Number: 2

## 2022-12-26 LAB — URINE CULTURE: Culture: NO GROWTH

## 2022-12-27 ENCOUNTER — Other Ambulatory Visit: Payer: Self-pay

## 2022-12-27 ENCOUNTER — Ambulatory Visit
Admission: RE | Admit: 2022-12-27 | Discharge: 2022-12-27 | Disposition: A | Discharge status: Home / Self Care | Payer: Medicare Other | Source: Ambulatory Visit | Attending: Radiation Oncology | Admitting: Radiation Oncology

## 2022-12-27 DIAGNOSIS — Z5111 Encounter for antineoplastic chemotherapy: Secondary | ICD-10-CM | POA: Diagnosis not present

## 2022-12-27 DIAGNOSIS — C3411 Malignant neoplasm of upper lobe, right bronchus or lung: Secondary | ICD-10-CM | POA: Diagnosis not present

## 2022-12-27 DIAGNOSIS — Z510 Encounter for antineoplastic radiation therapy: Secondary | ICD-10-CM | POA: Diagnosis not present

## 2022-12-27 LAB — RAD ONC ARIA SESSION SUMMARY
Course Elapsed Days: 2
Plan Fractions Treated to Date: 3
Plan Prescribed Dose Per Fraction: 2 Gy
Plan Total Fractions Prescribed: 30
Plan Total Prescribed Dose: 60 Gy
Reference Point Dosage Given to Date: 6 Gy
Reference Point Session Dosage Given: 2 Gy
Session Number: 3

## 2022-12-28 ENCOUNTER — Ambulatory Visit
Admission: RE | Admit: 2022-12-28 | Discharge: 2022-12-28 | Disposition: A | Discharge status: Home / Self Care | Payer: Medicare Other | Source: Ambulatory Visit | Attending: Radiation Oncology | Admitting: Radiation Oncology

## 2022-12-28 ENCOUNTER — Other Ambulatory Visit: Payer: Self-pay

## 2022-12-28 DIAGNOSIS — Z510 Encounter for antineoplastic radiation therapy: Secondary | ICD-10-CM | POA: Diagnosis not present

## 2022-12-28 DIAGNOSIS — Z5111 Encounter for antineoplastic chemotherapy: Secondary | ICD-10-CM | POA: Diagnosis not present

## 2022-12-28 DIAGNOSIS — C3411 Malignant neoplasm of upper lobe, right bronchus or lung: Secondary | ICD-10-CM | POA: Diagnosis not present

## 2022-12-28 LAB — RAD ONC ARIA SESSION SUMMARY
Course Elapsed Days: 3
Plan Fractions Treated to Date: 4
Plan Prescribed Dose Per Fraction: 2 Gy
Plan Total Fractions Prescribed: 30
Plan Total Prescribed Dose: 60 Gy
Reference Point Dosage Given to Date: 8 Gy
Reference Point Session Dosage Given: 2 Gy
Session Number: 4

## 2022-12-29 ENCOUNTER — Ambulatory Visit
Admission: RE | Admit: 2022-12-29 | Discharge: 2022-12-29 | Disposition: A | Discharge status: Home / Self Care | Payer: Medicare Other | Source: Ambulatory Visit | Attending: Radiation Oncology | Admitting: Radiation Oncology

## 2022-12-29 ENCOUNTER — Other Ambulatory Visit: Payer: Self-pay

## 2022-12-29 DIAGNOSIS — Z5111 Encounter for antineoplastic chemotherapy: Secondary | ICD-10-CM | POA: Diagnosis not present

## 2022-12-29 DIAGNOSIS — C3491 Malignant neoplasm of unspecified part of right bronchus or lung: Secondary | ICD-10-CM

## 2022-12-29 DIAGNOSIS — Z510 Encounter for antineoplastic radiation therapy: Secondary | ICD-10-CM | POA: Diagnosis not present

## 2022-12-29 DIAGNOSIS — C3411 Malignant neoplasm of upper lobe, right bronchus or lung: Secondary | ICD-10-CM | POA: Diagnosis not present

## 2022-12-29 LAB — RAD ONC ARIA SESSION SUMMARY
Course Elapsed Days: 4
Plan Fractions Treated to Date: 5
Plan Prescribed Dose Per Fraction: 2 Gy
Plan Total Fractions Prescribed: 30
Plan Total Prescribed Dose: 60 Gy
Reference Point Dosage Given to Date: 10 Gy
Reference Point Session Dosage Given: 2 Gy
Session Number: 5

## 2022-12-29 MED ORDER — SONAFINE EX EMUL
1.0000 | Freq: Once | CUTANEOUS | Status: AC
  Administered 2022-12-29: 1 via TOPICAL

## 2023-01-01 ENCOUNTER — Other Ambulatory Visit: Payer: Self-pay

## 2023-01-01 ENCOUNTER — Ambulatory Visit
Admission: RE | Admit: 2023-01-01 | Discharge: 2023-01-01 | Disposition: A | Discharge status: Home / Self Care | Payer: Medicare Other | Source: Ambulatory Visit | Attending: Radiation Oncology | Admitting: Radiation Oncology

## 2023-01-01 ENCOUNTER — Encounter (HOSPITAL_COMMUNITY): Payer: Self-pay

## 2023-01-01 DIAGNOSIS — Z5111 Encounter for antineoplastic chemotherapy: Secondary | ICD-10-CM | POA: Diagnosis not present

## 2023-01-01 DIAGNOSIS — Z510 Encounter for antineoplastic radiation therapy: Secondary | ICD-10-CM | POA: Diagnosis not present

## 2023-01-01 DIAGNOSIS — C3411 Malignant neoplasm of upper lobe, right bronchus or lung: Secondary | ICD-10-CM | POA: Diagnosis not present

## 2023-01-01 LAB — RAD ONC ARIA SESSION SUMMARY
Course Elapsed Days: 7
Plan Fractions Treated to Date: 6
Plan Prescribed Dose Per Fraction: 2 Gy
Plan Total Fractions Prescribed: 30
Plan Total Prescribed Dose: 60 Gy
Reference Point Dosage Given to Date: 12 Gy
Reference Point Session Dosage Given: 2 Gy
Session Number: 6

## 2023-01-01 MED FILL — Dexamethasone Sodium Phosphate Inj 100 MG/10ML: INTRAMUSCULAR | Qty: 1 | Status: AC

## 2023-01-02 ENCOUNTER — Telehealth: Payer: Self-pay | Admitting: Medical Oncology

## 2023-01-02 ENCOUNTER — Other Ambulatory Visit: Payer: Self-pay

## 2023-01-02 ENCOUNTER — Inpatient Hospital Stay (HOSPITAL_BASED_OUTPATIENT_CLINIC_OR_DEPARTMENT_OTHER): Payer: Medicare Other | Admitting: Internal Medicine

## 2023-01-02 ENCOUNTER — Inpatient Hospital Stay: Payer: Medicare Other

## 2023-01-02 ENCOUNTER — Ambulatory Visit
Admission: RE | Admit: 2023-01-02 | Discharge: 2023-01-02 | Disposition: A | Discharge status: Home / Self Care | Payer: Medicare Other | Source: Ambulatory Visit | Attending: Radiation Oncology | Admitting: Radiation Oncology

## 2023-01-02 ENCOUNTER — Encounter: Payer: Self-pay | Admitting: Internal Medicine

## 2023-01-02 VITALS — BP 144/79 | HR 76 | Resp 17

## 2023-01-02 DIAGNOSIS — C3491 Malignant neoplasm of unspecified part of right bronchus or lung: Secondary | ICD-10-CM

## 2023-01-02 DIAGNOSIS — C3411 Malignant neoplasm of upper lobe, right bronchus or lung: Secondary | ICD-10-CM | POA: Diagnosis not present

## 2023-01-02 DIAGNOSIS — Z5111 Encounter for antineoplastic chemotherapy: Secondary | ICD-10-CM | POA: Diagnosis not present

## 2023-01-02 DIAGNOSIS — Z510 Encounter for antineoplastic radiation therapy: Secondary | ICD-10-CM | POA: Diagnosis not present

## 2023-01-02 LAB — CBC WITH DIFFERENTIAL (CANCER CENTER ONLY)
Abs Immature Granulocytes: 0.03 10*3/uL (ref 0.00–0.07)
Basophils Absolute: 0 10*3/uL (ref 0.0–0.1)
Basophils Relative: 1 %
Eosinophils Absolute: 0.1 10*3/uL (ref 0.0–0.5)
Eosinophils Relative: 3 %
HCT: 45.3 % (ref 39.0–52.0)
Hemoglobin: 15.7 g/dL (ref 13.0–17.0)
Immature Granulocytes: 1 %
Lymphocytes Relative: 14 %
Lymphs Abs: 0.6 10*3/uL — ABNORMAL LOW (ref 0.7–4.0)
MCH: 29.8 pg (ref 26.0–34.0)
MCHC: 34.7 g/dL (ref 30.0–36.0)
MCV: 86.1 fL (ref 80.0–100.0)
Monocytes Absolute: 0.3 10*3/uL (ref 0.1–1.0)
Monocytes Relative: 8 %
Neutro Abs: 3.3 10*3/uL (ref 1.7–7.7)
Neutrophils Relative %: 73 %
Platelet Count: 189 10*3/uL (ref 150–400)
RBC: 5.26 MIL/uL (ref 4.22–5.81)
RDW: 12.4 % (ref 11.5–15.5)
WBC Count: 4.5 10*3/uL (ref 4.0–10.5)
nRBC: 0 % (ref 0.0–0.2)

## 2023-01-02 LAB — RAD ONC ARIA SESSION SUMMARY
Course Elapsed Days: 8
Plan Fractions Treated to Date: 7
Plan Prescribed Dose Per Fraction: 2 Gy
Plan Total Fractions Prescribed: 30
Plan Total Prescribed Dose: 60 Gy
Reference Point Dosage Given to Date: 14 Gy
Reference Point Session Dosage Given: 2 Gy
Session Number: 7

## 2023-01-02 LAB — CMP (CANCER CENTER ONLY)
ALT: 22 U/L (ref 0–44)
AST: 27 U/L (ref 15–41)
Albumin: 4.2 g/dL (ref 3.5–5.0)
Alkaline Phosphatase: 81 U/L (ref 38–126)
Anion gap: 6 (ref 5–15)
BUN: 24 mg/dL — ABNORMAL HIGH (ref 8–23)
CO2: 27 mmol/L (ref 22–32)
Calcium: 9.2 mg/dL (ref 8.9–10.3)
Chloride: 107 mmol/L (ref 98–111)
Creatinine: 1.11 mg/dL (ref 0.61–1.24)
GFR, Estimated: >60 mL/min (ref >60)
Glucose, Bld: 131 mg/dL — ABNORMAL HIGH (ref 70–99)
Potassium: 4.3 mmol/L (ref 3.5–5.1)
Sodium: 140 mmol/L (ref 135–145)
Total Bilirubin: 0.6 mg/dL (ref 0.3–1.2)
Total Protein: 7.3 g/dL (ref 6.5–8.1)

## 2023-01-02 MED ORDER — PALONOSETRON HCL INJECTION 0.25 MG/5ML
0.2500 mg | Freq: Once | INTRAVENOUS | Status: AC
  Administered 2023-01-02: 0.25 mg via INTRAVENOUS
  Filled 2023-01-02: qty 5

## 2023-01-02 MED ORDER — DIPHENHYDRAMINE HCL 50 MG/ML IJ SOLN
50.0000 mg | Freq: Once | INTRAMUSCULAR | Status: AC
  Administered 2023-01-02: 50 mg via INTRAVENOUS
  Filled 2023-01-02: qty 1

## 2023-01-02 MED ORDER — SODIUM CHLORIDE 0.9 % IV SOLN
10.0000 mg | Freq: Once | INTRAVENOUS | Status: AC
  Administered 2023-01-02: 10 mg via INTRAVENOUS
  Filled 2023-01-02: qty 10

## 2023-01-02 MED ORDER — SODIUM CHLORIDE 0.9 % IV SOLN
45.0000 mg/m2 | Freq: Once | INTRAVENOUS | Status: AC
  Administered 2023-01-02: 78 mg via INTRAVENOUS
  Filled 2023-01-02: qty 13

## 2023-01-02 MED ORDER — FAMOTIDINE IN NACL 20-0.9 MG/50ML-% IV SOLN
20.0000 mg | Freq: Once | INTRAVENOUS | Status: AC
  Administered 2023-01-02: 20 mg via INTRAVENOUS
  Filled 2023-01-02: qty 50

## 2023-01-02 MED ORDER — SODIUM CHLORIDE 0.9 % IV SOLN
155.4000 mg | Freq: Once | INTRAVENOUS | Status: AC
  Administered 2023-01-02: 150 mg via INTRAVENOUS
  Filled 2023-01-02: qty 15

## 2023-01-02 MED ORDER — SODIUM CHLORIDE 0.9 % IV SOLN: Freq: Once | INTRAVENOUS | Status: AC

## 2023-01-02 NOTE — Patient Instructions (Signed)
[facility]  Discharge Instructions: Thank you for choosing [facility] to provide your oncology and hematology care.   If you have a lab appointment with the [facility], please go directly to the [facility] and check in at the registration area.   Wear comfortable clothing and clothing appropriate for easy access to any Portacath or PICC line.   We strive to give you quality time with your provider. You may need to reschedule your appointment if you arrive late (15 or more minutes).  Arriving late affects you and other patients whose appointments are after yours.  Also, if you miss three or more appointments without notifying the office, you may be dismissed from the clinic at the provider's discretion.      For prescription refill requests, have your pharmacy contact our office and allow 72 hours for refills to be completed.    Today you received the following chemotherapy and/or immunotherapy agents paclitaxel, carboplatin      To help prevent nausea and vomiting after your treatment, we encourage you to take your nausea medication as directed.  BELOW ARE SYMPTOMS THAT SHOULD BE REPORTED IMMEDIATELY: *FEVER GREATER THAN 100.4 F (38 C) OR HIGHER *CHILLS OR SWEATING *NAUSEA AND VOMITING THAT IS NOT CONTROLLED WITH YOUR NAUSEA MEDICATION *UNUSUAL SHORTNESS OF BREATH *UNUSUAL BRUISING OR BLEEDING *URINARY PROBLEMS (pain or burning when urinating, or frequent urination) *BOWEL PROBLEMS (unusual diarrhea, constipation, pain near the anus) TENDERNESS IN MOUTH AND THROAT WITH OR WITHOUT PRESENCE OF ULCERS (sore throat, sores in mouth, or a toothache) UNUSUAL RASH, SWELLING OR PAIN  UNUSUAL VAGINAL DISCHARGE OR ITCHING   Items with * indicate a potential emergency and should be followed up as soon as possible or go to the Emergency Department if any problems should occur.  Please show the CHEMOTHERAPY ALERT CARD or IMMUNOTHERAPY ALERT  CARD at check-in to the Emergency Department and triage nurse.  Should you have questions after your visit or need to cancel or reschedule your appointment, please contact [facility]  Dept: [phone]  and follow the prompts.  Office hours are 8:00 a.m. to 4:30 p.m. Monday - Friday. Please note that voicemails left after 4:00 p.m. may not be returned until the following business day.  We are closed weekends and major holidays. You have access to a nurse at all times for urgent questions. Please call the main number to the clinic Dept: [phone] and follow the prompts.   For any non-urgent questions, you may also contact your provider using MyChart. We now offer e-Visits for anyone 20 and older to request care online for non-urgent symptoms. For details visit [email or web address].   Also download the MyChart app! Go to the app store, search "MyChart", open the app, select [facility], and log in with your MyChart username and password.

## 2023-01-02 NOTE — Progress Notes (Signed)
[place] [phone]   [phone]  OFFICE PROGRESS NOTE  [name], [name], MD [place] [address]  DIAGNOSIS: Stage IIIb (T3, N2, M0) non-small cell lung cancer, squamous cell carcinoma presented with 2 separate right upper lobe lung nodule in addition to mediastinal lymphadenopathy diagnosed in January 2024  PRIOR THERAPY: None  CURRENT THERAPY: A course of concurrent chemoradiation with weekly carboplatin for AUC of 2 and paclitaxel 45 Mg/M2.  First dose expected January 02, 2023.  INTERVAL HISTORY: Alan Mendez 82 y.o. male returns to the clinic today for follow-up visit accompanied by his friend Susette Racer.  The patient is feeling fine today with no concerning complaints except for a fall that happened during a [employer] event after he fainted while trying to stand up.  He was taken to the emergency department and was found to have right fibular fracture.  He was evaluated by the hospitalist and recommendation from Dr. Alvan Dame with orthopedic surgery is no intervention and just provide the patient with a walker boot.  He is feeling much better today and is here to start the first cycle of his concurrent chemotherapy.  He started radiotherapy last week.  He has no current nausea, vomiting, diarrhea or constipation.  He has no headache or visual changes.  He has no recent weight loss or night sweats.  He has no fever or chills.  He has no chest pain, shortness of breath, cough or hemoptysis.  MEDICAL HISTORY: Past Medical History:  Diagnosis Date   Acquired solitary kidney    left --  s/p  right nephroureterctomy 01/ 2018   Anxiety    no current problems   Depression    no current problems   Elevated PSA    Enlarged lymph node 11/2022   enlarged right paratracheal lymph node.   History of adenomatous polyp of colon    tubular adenoma 2013   History of kidney cancer urologist-  dr Louis Meckel   dx 11/ 2017--  11-15-2016   s/p  right nephoureterectomy (per path report-- low grade papillay urothelial carcinoma in situ involving renal pelvis, negative margins)   History of kidney stones    passed stones and also surgery to remove   History of unilateral nephrectomy    01/ 2018  right nephroureterectomy for renal pelvis mass (carcinoma in situ)   HLD (hyperlipidemia)    on crestor   Hyperplasia of prostate without lower urinary tract symptoms (LUTS)    Lung cancer ([facility]) 12/07/2022   Nephrolithiasis    bilateral non-obstructive   Pneumonia    x 1 - yrs ago   Pulmonary nodules 11/2022   2 right upper lobe pulmonary nodules   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H[facility])    Renal cyst, acquired, right    right kidney removed   Wears glasses     ALLERGIES:  is allergic to lamisil [terbinafine].  MEDICATIONS:  Current Outpatient Medications  Medication Sig Dispense Refill   cholecalciferol (VITAMIN D3) 25 MCG (1000 UNIT) tablet Take 1,000 Units by mouth daily.     Multiple Vitamins-Minerals (MULTIVITAMIN WITH MINERALS) tablet Take 1 tablet by mouth daily. Centrum     rosuvastatin (CRESTOR) 10 MG tablet TAKE 1 TABLET EACH DAY. (Patient taking differently: Take 20 mg by mouth daily.) 30 tablet 0   tamsulosin (FLOMAX) 0.4 MG CAPS capsule Take 1 capsule (0.4 mg total) by mouth daily. (Patient taking differently: Take 0.4 mg by mouth daily  after breakfast.) 30 capsule 5   No current facility-administered medications for this visit.    SURGICAL HISTORY:  Past Surgical History:  Procedure Laterality Date   APPENDECTOMY  child   BRONCHIAL BIOPSY  12/07/2022   Procedure: BRONCHIAL BIOPSIES;  Surgeon: Garner Nash, DO;  Location: [facility];  Service: Pulmonary;;   BRONCHIAL BRUSHINGS  12/07/2022   Procedure: BRONCHIAL BRUSHINGS;  Surgeon: Garner Nash, DO;  Location: [facility];  Service: Pulmonary;;   BRONCHIAL NEEDLE ASPIRATION BIOPSY  12/07/2022   Procedure: BRONCHIAL NEEDLE ASPIRATION BIOPSIES;   Surgeon: Garner Nash, DO;  Location: [facility];  Service: Pulmonary;;   CATARACT EXTRACTION W/ INTRAOCULAR LENS  IMPLANT, BILATERAL  2017   COLONOSCOPY  08/15/2012   Tubular Adenoma, No high grade dysplasia or malignacy.   CYSTOSCOPY W/ RETROGRADES Right 09/25/2016   Procedure: CYSTOSCOPY, URETHRAL DILITATION  WITH RETROGRADE PYELOGRAM, BRUSH BIOPSIES OF RIGHT RENAL MASS;  Surgeon: Carolan Clines, MD;  Location: [facility];  Service: Urology;  Laterality: Right;   CYSTOSCOPY W/ RETROGRADES Left 03/16/2017   Procedure: CYSTOSCOPY WITH RETROGRADE PYELOGRAM;  Surgeon: Ardis Hughs, MD;  Location: [facility];  Service: Urology;  Laterality: Left;   CYSTOSCOPY W/ URETERAL STENT REMOVAL Right 09/25/2016   Procedure: CYSTOSCOPY WITH STENT REMOVAL;  Surgeon: Carolan Clines, MD;  Location: [facility];  Service: Urology;  Laterality: Right;   CYSTOSCOPY WITH RETROGRADE PYELOGRAM, URETEROSCOPY AND STENT PLACEMENT Right 08/21/2016   Procedure: CYSTOSCOPY WITH RIGHT RETROGRADE PYELOGRAM, RIGHT FLEXIBLE AND RIGID URETEROSCOPY, INSERTION DOUBLE J STENT RIGHT;  Surgeon: Carolan Clines, MD;  Location: [facility];  Service: Urology;  Laterality: Right;   EXTRACORPOREAL SHOCK WAVE LITHOTRIPSY  2009   KNEE SURGERY Right 1980's   ROBOT ASSITED LAPAROSCOPIC NEPHROURETERECTOMY Right 11/15/2016   Procedure: RIGHT XI ROBOT ASSITED LAPAROSCOPIC NEPHROURETERECTOMY;  Surgeon: Ardis Hughs, MD;  Location: [facility];  Service: Urology;  Laterality: Right;   TRANSURETHRAL RESECTION OF BLADDER TUMOR WITH MITOMYCIN-C Left 03/16/2017   Procedure: CYSTOSCOPY BIOPSIES OF BLADDER TUMOR WITH FULGURATION;  Surgeon: Ardis Hughs, MD;  Location: [facility];  Service: Urology;  Laterality: Left;   URETEROSCOPY Right 09/25/2016   Procedure: RIGHT URETEROSCOPY;  Surgeon: Carolan Clines, MD;  Location: [facility];   Service: Urology;  Laterality: Right;   VIDEO BRONCHOSCOPY WITH ENDOBRONCHIAL ULTRASOUND Right 12/07/2022   Procedure: VIDEO BRONCHOSCOPY WITH ENDOBRONCHIAL ULTRASOUND;  Surgeon: Garner Nash, DO;  Location: [facility];  Service: Pulmonary;  Laterality: Right;    REVIEW OF SYSTEMS:  A comprehensive review of systems was negative except for: Constitutional: positive for fatigue   PHYSICAL EXAMINATION: General appearance: alert, cooperative, fatigued, and no distress Head: Normocephalic, without obvious abnormality, atraumatic Neck: no adenopathy, no JVD, supple, symmetrical, trachea midline, and thyroid not enlarged, symmetric, no tenderness/mass/nodules Lymph nodes: Cervical, supraclavicular, and axillary nodes normal. Resp: clear to auscultation bilaterally Back: symmetric, no curvature. ROM normal. No CVA tenderness. Cardio: regular rate and rhythm, S1, S2 normal, no murmur, click, rub or gallop GI: soft, non-tender; bowel sounds normal; no masses,  no organomegaly Extremities: extremities normal, atraumatic, no cyanosis or edema  ECOG PERFORMANCE STATUS: 1 - Symptomatic but completely ambulatory  Blood pressure (!) 139/90, pulse 75, temperature 98.2 F (36.8 C), temperature source Oral, resp. rate 14, weight 146 lb 12.8 oz (66.6 kg), SpO2 97 %.  LABORATORY DATA: Lab Results  Component Value Date   WBC 4.5 01/02/2023   HGB 15.7  01/02/2023   HCT 45.3 01/02/2023   MCV 86.1 01/02/2023   PLT 189 01/02/2023      Chemistry      Component Value Date/Time   NA 140 01/02/2023 0924   K 4.3 01/02/2023 0924   CL 107 01/02/2023 0924   CO2 27 01/02/2023 0924   BUN 24 (H) 01/02/2023 0924   CREATININE 1.11 01/02/2023 0924   CREATININE 0.85 12/10/2014 1550      Component Value Date/Time   CALCIUM 9.2 01/02/2023 0924   ALKPHOS 81 01/02/2023 0924   AST 27 01/02/2023 0924   ALT 22 01/02/2023 0924   BILITOT 0.6 01/02/2023 0924       RADIOGRAPHIC STUDIES: ECHOCARDIOGRAM  COMPLETE  Result Date: 12/25/2022    ECHOCARDIOGRAM REPORT   Patient Name:   GEORDIE NOONEY Date of Exam: 12/25/2022 Medical Rec #:  [id number]      Height:       65.0 in Accession #:    [id number]     Weight:       147.3 lb Date of Birth:  [birth year]       BSA:          1.737 m Patient Age:    16 years       BP:           139/74 mmHg Patient Gender: M              HR:           66 bpm. Exam Location:  Inpatient Procedure: 2D Echo, Cardiac Doppler and Color Doppler Indications:    syncope  History:        Patient has no prior history of Echocardiogram examinations.                 Risk Factors:Dyslipidemia.  Sonographer:    Phineas Douglas Referring Phys: [id number] [place]  1. Left ventricular ejection fraction, by estimation, is 60 to 65%. The left ventricle has normal function. The left ventricle has no regional wall motion abnormalities. Left ventricular diastolic parameters were normal.  2. Right ventricular systolic function is normal. The right ventricular size is normal. There is normal pulmonary artery systolic pressure.  3. The mitral valve is normal in structure. Trivial mitral valve regurgitation. No evidence of mitral stenosis.  4. The aortic valve is tricuspid. There is mild calcification of the aortic valve. There is mild thickening of the aortic valve. Aortic valve regurgitation is trivial.  5. The inferior vena cava is normal in size with greater than 50% respiratory variability, suggesting right atrial pressure of 3 mmHg. Comparison(s): No prior Echocardiogram. Conclusion(s)/Recommendation(s): Normal biventricular function without evidence of hemodynamically significant valvular heart disease. FINDINGS  Left Ventricle: Left ventricular ejection fraction, by estimation, is 60 to 65%. The left ventricle has normal function. The left ventricle has no regional wall motion abnormalities. The left ventricular internal cavity size was normal in size. There is  borderline left ventricular  hypertrophy. Left ventricular diastolic parameters were normal. Right Ventricle: The right ventricular size is normal. Right vetricular wall thickness was not well visualized. Right ventricular systolic function is normal. There is normal pulmonary artery systolic pressure. The tricuspid regurgitant velocity is 2.52 m/s, and with an assumed right atrial pressure of 3 mmHg, the estimated right ventricular systolic pressure is 93.8 mmHg. Left Atrium: Left atrial size was normal in size. Right Atrium: Right atrial size was normal in size. Pericardium: There is no evidence of pericardial effusion. Mitral  Valve: The mitral valve is normal in structure. Trivial mitral valve regurgitation. No evidence of mitral valve stenosis. Tricuspid Valve: The tricuspid valve is normal in structure. Tricuspid valve regurgitation is mild . No evidence of tricuspid stenosis. Aortic Valve: The aortic valve is tricuspid. There is mild calcification of the aortic valve. There is mild thickening of the aortic valve. Aortic valve regurgitation is trivial. Aortic regurgitation PHT measures 577 msec. Pulmonic Valve: The pulmonic valve was grossly normal. Pulmonic valve regurgitation is trivial. No evidence of pulmonic stenosis. Aorta: The aortic root, ascending aorta, aortic arch and descending aorta are all structurally normal, with no evidence of dilitation or obstruction. Venous: The inferior vena cava is normal in size with greater than 50% respiratory variability, suggesting right atrial pressure of 3 mmHg. IAS/Shunts: The atrial septum is grossly normal.  LEFT VENTRICLE PLAX 2D LVIDd:         4.20 cm     Diastology LVIDs:         2.70 cm     LV e' medial:    7.83 cm/s LV PW:         1.10 cm     LV E/e' medial:  7.9 LV IVS:        1.20 cm     LV e' lateral:   8.93 cm/s LVOT diam:     1.90 cm     LV E/e' lateral: 7.0 LV SV:         47 LV SV Index:   27 LVOT Area:     2.84 cm  LV Volumes (MOD) LV vol d, MOD A2C: 99.0 ml LV vol d, MOD A4C:  85.9 ml LV vol s, MOD A2C: 40.5 ml LV vol s, MOD A4C: 38.6 ml LV SV MOD A2C:     58.5 ml LV SV MOD A4C:     85.9 ml LV SV MOD BP:      52.4 ml RIGHT VENTRICLE             IVC RV Basal diam:  3.90 cm     IVC diam: 1.80 cm RV S prime:     16.40 cm/s TAPSE (M-mode): 2.1 cm LEFT ATRIUM             Index        RIGHT ATRIUM           Index LA diam:        3.20 cm 1.84 cm/m   RA Area:     13.50 cm LA Vol (A2C):   51.1 ml 29.42 ml/m  RA Volume:   28.90 ml  16.64 ml/m LA Vol (A4C):   37.0 ml 21.30 ml/m LA Biplane Vol: 43.3 ml 24.93 ml/m  AORTIC VALVE             PULMONIC VALVE LVOT Vmax:   79.20 cm/s  PR End Diast Vel: 1.63 msec LVOT Vmean:  52.800 cm/s LVOT VTI:    0.165 m AI PHT:      577 msec  AORTA Ao Root diam: 3.10 cm Ao Asc diam:  3.00 cm MITRAL VALVE               TRICUSPID VALVE MV Area (PHT): 3.31 cm    TR Peak grad:   25.4 mmHg MV Decel Time: 229 msec    TR Vmax:        252.00 cm/s MV E velocity: 62.10 cm/s MV A velocity: 72.00 cm/s  SHUNTS MV E/A ratio:  0.86        Systemic VTI:  0.16 m                            Systemic Diam: 1.90 cm Buford Dresser MD Electronically signed by Buford Dresser MD Signature Date/Time: 12/25/2022/3:46:29 PM    Final    DG Ankle Complete Right  Result Date: 12/24/2022 CLINICAL DATA:  Trauma to the lateral right ankle. EXAM: RIGHT ANKLE - COMPLETE 3+ VIEW COMPARISON:  None Available. FINDINGS: Mildly displaced fracture of the distal fibula with approximately 4 mm lateral displacement of the distal fracture fragment. No other acute fracture. There is no dislocation. The ankle mortise is intact. Mild soft tissue swelling over the lateral malleolus. No acute foreign object or soft tissue gas. IMPRESSION: Mildly displaced fracture of the distal fibula. Electronically Signed   By: Anner Crete M.D.   On: 12/24/2022 21:12   DG Chest Portable 1 View  Result Date: 12/24/2022 CLINICAL DATA:  Syncope EXAM: PORTABLE CHEST 1 VIEW COMPARISON:  Chest x-ray  12/07/2022 FINDINGS: The heart size and mediastinal contours are within normal limits. Both lungs are clear. Again seen is scoliosis of the thoracic spine. No acute fractures are seen. IMPRESSION: No active disease. Electronically Signed   By: Ronney Asters M.D.   On: 12/24/2022 19:06   NM PET Image Initial (PI) Skull Base To Thigh (F-18 FDG)  Result Date: 12/15/2022 CLINICAL DATA:  Initial treatment strategy for lung cancer. Bronchoscopy 12/07/2022 documented non-small-cell carcinoma in a right upper lobe pulmonary lesion. Patient with personal history of urothelial malignancy status post right nephro ureterectomy and history of bladder cancer. EXAM: NUCLEAR MEDICINE PET SKULL BASE TO THIGH TECHNIQUE: 7.3 mCi F-18 FDG was injected intravenously. Full-ring PET imaging was performed from the skull base to thigh after the radiotracer. CT data was obtained and used for attenuation correction and anatomic localization. Fasting blood glucose: 90 mg/dl COMPARISON:  Chest, abdomen and pelvis CT 11/22/2022 FINDINGS: Mediastinal blood pool activity: SUV max 2.6 Liver activity: SUV max NA NECK: No hypermetabolic lymph nodes in the neck. Incidental CT findings: None. CHEST: 13 mm left thyroid nodule is hypermetabolic with SUV max = 76.7 19 mm nodule medial right upper lobe on 55/4 is markedly hypermetabolic with SUV max = 9.3. A second paraspinal right upper lobe nodule is identified slightly more inferiorly measuring 18 mm on 62/4. This is also hypermetabolic with SUV max = 34.1. 21 mm short axis retrotracheal node on 93/7 is hypermetabolic with SUV max = 5.8. No hypermetabolic axillary or left hilar lymphadenopathy. No additional hypermetabolic pulmonary nodule or mass lesion evident. Incidental CT findings: There is mild atherosclerotic calcification of the abdominal aorta without aneurysm. Coronary artery calcification is evident. No pulmonary edema or pleural effusion. ABDOMEN/PELVIS: No abnormal hypermetabolic  activity within the liver, pancreas, adrenal glands, or spleen. No hypermetabolic lymph nodes in the abdomen or pelvis. Incidental CT findings: 8 mm low-density lesion in the medial segment left liver shows no hypermetabolism, compatible with a cyst. No followup imaging is recommended. 13 mm calcified gallstone evident. Right kidney surgically absent. 12 mm water is the lesion upper pole left kidney is compatible with a cyst. No followup imaging is recommended. There is mild atherosclerotic calcification of the abdominal aorta without aneurysm. Diverticular disease noted left colon without diverticulitis. Prostate gland is enlarged. SKELETON: No focal hypermetabolic activity to suggest skeletal metastasis. Incidental CT findings: Thoracolumbar scoliosis evident. IMPRESSION: 1. Two hypermetabolic right  upper lobe pulmonary nodules, consistent with neoplasm. 2. Hypermetabolic retrotracheal lymph node, consistent with metastatic disease. 3. Hypermetabolic 13 mm left thyroid nodule. Recommend thyroid US and biopsy. (Ref: J Am Coll Radiol. 2015 Feb;12(2): 143-50). 4. No evidence for hypermetabolic metastatic disease in the abdomen or pelvis. 5. Status post right nephroureterectomy. 6. Cholelithiasis. 7. Left colonic diverticulosis without diverticulitis. 8. Prostatomegaly. 9.  Aortic Atherosclerosis (ICD10-I70.0). Electronically Signed   By: Misty Stanley M.D.   On: 12/15/2022 10:20   MR BRAIN W WO CONTRAST  Result Date: 12/12/2022 CLINICAL DATA:  Non-small cell lung cancer, staging EXAM: MRI HEAD WITHOUT AND WITH CONTRAST TECHNIQUE: Multiplanar, multiecho pulse sequences of the brain and surrounding structures were obtained without and with intravenous contrast. CONTRAST:  33mL GADAVIST GADOBUTROL 1 MMOL/ML IV SOLN COMPARISON:  No prior MRI available FINDINGS: Brain: No restricted diffusion to suggest acute or subacute infarct. No abnormal parenchymal or meningeal enhancement. No acute hemorrhage, mass, mass effect,  or midline shift. No hydrocephalus or extra-axial collection. Normal pituitary and craniocervical junction. No hemosiderin deposition to suggest remote hemorrhage. Scattered T2 hyperintense signal in the periventricular white matter, likely the sequela of minimal chronic small vessel ischemic disease. Vascular: Patent arterial flow voids. Normal arterial and venous enhancement. Skull and upper cervical spine: 3 mm enhancing focus in the head of the left mandible (series 16, image 40). Otherwise normal marrow signal. T2 hyperintense lesion in the right suboccipital soft tissues (series 9, image 4), which is T1 hypointense with minimal focal enhancement, measuring up to 1.2 x 1.1 x 1.3 cm (AP x TR x CC) (series 16, image 23 and series 17, image 7). Sinuses/Orbits: Clear paranasal sinuses. No acute finding in the orbits. Status post bilateral lens replacements. Other: The mastoid air cells are well aerated. IMPRESSION: 1. No acute intracranial process. No evidence of metastatic disease in the brain. 2. 3 mm enhancing focus in the head of the left mandible, which is indeterminate but concerning for a small focus of metastatic disease given the patient's history. 3. T2 hyperintense lesion in the right suboccipital soft tissues, which is T1 hypointense with minimal focal enhancement, measuring up to 1.3 cm, which is also indeterminate. Electronically Signed   By: Merilyn Baba M.D.   On: 12/12/2022 17:18   DG CHEST PORT 1 VIEW  Result Date: 12/07/2022 CLINICAL DATA:  Post bronchoscopy EXAM: PORTABLE CHEST 1 VIEW COMPARISON:  Portable exam 1046 hours compared to CT exam of 11/22/2022 FINDINGS: Normal heart size, mediastinal contours, and pulmonary vascularity. Atherosclerotic calcification aorta. Linear subsegmental atelectasis medial LEFT lower lobe. RIGHT lung nodules/masses identified on prior CT are poorly demonstrated on current radiographic exam. No acute infiltrate, pleural effusion, or pneumothorax. Bones  demineralized with biconvex scoliosis. IMPRESSION: No pneumothorax. Streaky atelectasis medial LEFT lower lobe. Previously identified RIGHT lung nodules/masses poorly visualized. Electronically Signed   By: Lavonia Dana M.D.   On: 12/07/2022 11:00   DG C-ARM BRONCHOSCOPY  Result Date: 12/07/2022 C-ARM BRONCHOSCOPY: Fluoroscopy was utilized by the requesting physician.  No radiographic interpretation.    ASSESSMENT AND PLAN: This is a very pleasant 82 years old white male with Stage IIIb (T3, N2, M0) non-small cell lung cancer, squamous cell carcinoma presented with 2 separate right upper lobe lung nodule in addition to mediastinal lymphadenopathy diagnosed in January 2024. Patient had a recent MRI of the brain as well as a PET scan.  I personally and independently reviewed the imaging and discussed the result with the patient and his fiance today. The  patient has no evidence of metastatic disease to the brain or extrathoracic metastasis besides the 2 right upper lobe pulmonary nodule and mediastinal lymphadenopathy. I discussed his case with Dr. Roxan Hockey, cardiothoracic surgery and he indicated that because of the location of the lymph node, the patient will not be a great candidate for surgical resection with negative margin. The patient is currently undergoing a course of concurrent chemoradiation with weekly carboplatin for AUC of 2 and paclitaxel 45 Mg/M2.  He starting the first cycle of the chemotherapy today.  He started several fractions of radiotherapy last week. I recommended for the patient to proceed with his chemotherapy today as planned. I will see him back for follow-up visit in 2 weeks for evaluation before starting cycle #3. He was advised to call immediately if he has any other concerning symptoms in the interval.  The patient voices understanding of current disease status and treatment options and is in agreement with the current care plan.  All questions were answered. The  patient knows to call the clinic with any problems, questions or concerns. We can certainly see the patient much sooner if necessary.  The total time spent in the appointment was 20 minutes.  Disclaimer: This note was dictated with voice recognition software. Similar sounding words can inadvertently be transcribed and may not be corrected upon review.

## 2023-01-02 NOTE — Telephone Encounter (Signed)
Pt picked up compazine on 02/15. I will add it to his MAR.

## 2023-01-02 NOTE — Progress Notes (Signed)
Patient seen by MD today  Vitals are within treatment parameters.  Labs reviewed: and are within treatment parameters.  Per physician team, patient is ready for treatment and there are NO modifications to the treatment plan.

## 2023-01-03 ENCOUNTER — Ambulatory Visit
Admission: RE | Admit: 2023-01-03 | Discharge: 2023-01-03 | Disposition: A | Discharge status: Home / Self Care | Payer: Medicare Other | Source: Ambulatory Visit | Attending: Radiation Oncology | Admitting: Radiation Oncology

## 2023-01-03 ENCOUNTER — Other Ambulatory Visit: Payer: Self-pay

## 2023-01-03 ENCOUNTER — Telehealth: Payer: Self-pay

## 2023-01-03 DIAGNOSIS — Z510 Encounter for antineoplastic radiation therapy: Secondary | ICD-10-CM | POA: Diagnosis not present

## 2023-01-03 DIAGNOSIS — Z5111 Encounter for antineoplastic chemotherapy: Secondary | ICD-10-CM | POA: Diagnosis not present

## 2023-01-03 DIAGNOSIS — C3411 Malignant neoplasm of upper lobe, right bronchus or lung: Secondary | ICD-10-CM | POA: Diagnosis not present

## 2023-01-03 LAB — RAD ONC ARIA SESSION SUMMARY
Course Elapsed Days: 9
Plan Fractions Treated to Date: 8
Plan Prescribed Dose Per Fraction: 2 Gy
Plan Total Fractions Prescribed: 30
Plan Total Prescribed Dose: 60 Gy
Reference Point Dosage Given to Date: 16 Gy
Reference Point Session Dosage Given: 2 Gy
Session Number: 8

## 2023-01-03 NOTE — Telephone Encounter (Signed)
Alan Mendez states that he is doing fine. He is eating drinking and urinating well. He knows to call the office at [phone] if he has any questions or concerns.

## 2023-01-03 NOTE — Telephone Encounter (Signed)
-----   Message from Gillian Shields, RN sent at 01/02/2023  4:53 PM EST ----- Regarding: FT Mohamed taxol/carbo FT taxol Norma Fredrickson patient of [facility]. Able to complete treatment without incident.

## 2023-01-04 ENCOUNTER — Ambulatory Visit
Admission: RE | Admit: 2023-01-04 | Discharge: 2023-01-04 | Disposition: A | Discharge status: Home / Self Care | Payer: Medicare Other | Source: Ambulatory Visit | Attending: Radiation Oncology | Admitting: Radiation Oncology

## 2023-01-04 ENCOUNTER — Other Ambulatory Visit: Payer: Self-pay

## 2023-01-04 ENCOUNTER — Other Ambulatory Visit: Payer: Self-pay | Admitting: Orthopaedic Surgery

## 2023-01-04 DIAGNOSIS — C3411 Malignant neoplasm of upper lobe, right bronchus or lung: Secondary | ICD-10-CM | POA: Diagnosis not present

## 2023-01-04 DIAGNOSIS — S8261XA Displaced fracture of lateral malleolus of right fibula, initial encounter for closed fracture: Secondary | ICD-10-CM | POA: Diagnosis not present

## 2023-01-04 DIAGNOSIS — M25571 Pain in right ankle and joints of right foot: Secondary | ICD-10-CM | POA: Diagnosis not present

## 2023-01-04 DIAGNOSIS — Z510 Encounter for antineoplastic radiation therapy: Secondary | ICD-10-CM | POA: Diagnosis not present

## 2023-01-04 DIAGNOSIS — Z5111 Encounter for antineoplastic chemotherapy: Secondary | ICD-10-CM | POA: Diagnosis not present

## 2023-01-04 LAB — RAD ONC ARIA SESSION SUMMARY
Course Elapsed Days: 10
Plan Fractions Treated to Date: 9
Plan Prescribed Dose Per Fraction: 2 Gy
Plan Total Fractions Prescribed: 30
Plan Total Prescribed Dose: 60 Gy
Reference Point Dosage Given to Date: 18 Gy
Reference Point Session Dosage Given: 2 Gy
Session Number: 9

## 2023-01-05 ENCOUNTER — Ambulatory Visit
Admission: RE | Admit: 2023-01-05 | Discharge: 2023-01-05 | Disposition: A | Discharge status: Home / Self Care | Payer: Medicare Other | Source: Ambulatory Visit | Attending: Orthopaedic Surgery | Admitting: Orthopaedic Surgery

## 2023-01-05 ENCOUNTER — Other Ambulatory Visit: Payer: Self-pay

## 2023-01-05 ENCOUNTER — Ambulatory Visit
Admission: RE | Admit: 2023-01-05 | Discharge: 2023-01-05 | Disposition: A | Discharge status: Home / Self Care | Payer: Medicare Other | Source: Ambulatory Visit | Attending: Radiation Oncology | Admitting: Radiation Oncology

## 2023-01-05 DIAGNOSIS — C3411 Malignant neoplasm of upper lobe, right bronchus or lung: Secondary | ICD-10-CM | POA: Diagnosis not present

## 2023-01-05 DIAGNOSIS — M7989 Other specified soft tissue disorders: Secondary | ICD-10-CM | POA: Diagnosis not present

## 2023-01-05 DIAGNOSIS — Z510 Encounter for antineoplastic radiation therapy: Secondary | ICD-10-CM | POA: Diagnosis not present

## 2023-01-05 DIAGNOSIS — S8261XA Displaced fracture of lateral malleolus of right fibula, initial encounter for closed fracture: Secondary | ICD-10-CM

## 2023-01-05 DIAGNOSIS — Z5111 Encounter for antineoplastic chemotherapy: Secondary | ICD-10-CM | POA: Diagnosis not present

## 2023-01-05 LAB — RAD ONC ARIA SESSION SUMMARY
Course Elapsed Days: 11
Plan Fractions Treated to Date: 10
Plan Prescribed Dose Per Fraction: 2 Gy
Plan Total Fractions Prescribed: 30
Plan Total Prescribed Dose: 60 Gy
Reference Point Dosage Given to Date: 20 Gy
Reference Point Session Dosage Given: 2 Gy
Session Number: 10

## 2023-01-05 MED FILL — Dexamethasone Sodium Phosphate Inj 100 MG/10ML: INTRAMUSCULAR | Qty: 1 | Status: AC

## 2023-01-07 ENCOUNTER — Other Ambulatory Visit: Payer: Self-pay

## 2023-01-08 ENCOUNTER — Other Ambulatory Visit: Payer: Self-pay

## 2023-01-08 ENCOUNTER — Inpatient Hospital Stay: Payer: Medicare Other

## 2023-01-08 ENCOUNTER — Ambulatory Visit
Admission: RE | Admit: 2023-01-08 | Discharge: 2023-01-08 | Disposition: A | Discharge status: Home / Self Care | Payer: Medicare Other | Source: Ambulatory Visit | Attending: Radiation Oncology | Admitting: Radiation Oncology

## 2023-01-08 ENCOUNTER — Ambulatory Visit: Payer: Medicare Other

## 2023-01-08 ENCOUNTER — Other Ambulatory Visit: Payer: Medicare Other

## 2023-01-08 VITALS — BP 153/64 | HR 67 | Temp 97.7°F | Resp 18 | Ht 65.0 in | Wt 146.0 lb

## 2023-01-08 DIAGNOSIS — C3411 Malignant neoplasm of upper lobe, right bronchus or lung: Secondary | ICD-10-CM | POA: Diagnosis not present

## 2023-01-08 DIAGNOSIS — Z5111 Encounter for antineoplastic chemotherapy: Secondary | ICD-10-CM | POA: Diagnosis not present

## 2023-01-08 DIAGNOSIS — C3491 Malignant neoplasm of unspecified part of right bronchus or lung: Secondary | ICD-10-CM

## 2023-01-08 DIAGNOSIS — Z510 Encounter for antineoplastic radiation therapy: Secondary | ICD-10-CM | POA: Diagnosis not present

## 2023-01-08 LAB — RAD ONC ARIA SESSION SUMMARY
Course Elapsed Days: 14
Plan Fractions Treated to Date: 11
Plan Prescribed Dose Per Fraction: 2 Gy
Plan Total Fractions Prescribed: 30
Plan Total Prescribed Dose: 60 Gy
Reference Point Dosage Given to Date: 22 Gy
Reference Point Session Dosage Given: 2 Gy
Session Number: 11

## 2023-01-08 LAB — CBC WITH DIFFERENTIAL (CANCER CENTER ONLY)
Abs Immature Granulocytes: 0.05 10*3/uL (ref 0.00–0.07)
Basophils Absolute: 0 10*3/uL (ref 0.0–0.1)
Basophils Relative: 1 %
Eosinophils Absolute: 0.1 10*3/uL (ref 0.0–0.5)
Eosinophils Relative: 2 %
HCT: 45.3 % (ref 39.0–52.0)
Hemoglobin: 15.7 g/dL (ref 13.0–17.0)
Immature Granulocytes: 1 %
Lymphocytes Relative: 13 %
Lymphs Abs: 0.6 10*3/uL — ABNORMAL LOW (ref 0.7–4.0)
MCH: 29.1 pg (ref 26.0–34.0)
MCHC: 34.7 g/dL (ref 30.0–36.0)
MCV: 83.9 fL (ref 80.0–100.0)
Monocytes Absolute: 0.3 10*3/uL (ref 0.1–1.0)
Monocytes Relative: 6 %
Neutro Abs: 3.4 10*3/uL (ref 1.7–7.7)
Neutrophils Relative %: 77 %
Platelet Count: 224 10*3/uL (ref 150–400)
RBC: 5.4 MIL/uL (ref 4.22–5.81)
RDW: 12.4 % (ref 11.5–15.5)
WBC Count: 4.3 10*3/uL (ref 4.0–10.5)
nRBC: 0 % (ref 0.0–0.2)

## 2023-01-08 LAB — CMP (CANCER CENTER ONLY)
ALT: 28 U/L (ref 0–44)
AST: 26 U/L (ref 15–41)
Albumin: 4.2 g/dL (ref 3.5–5.0)
Alkaline Phosphatase: 75 U/L (ref 38–126)
Anion gap: 9 (ref 5–15)
BUN: 22 mg/dL (ref 8–23)
CO2: 24 mmol/L (ref 22–32)
Calcium: 9.3 mg/dL (ref 8.9–10.3)
Chloride: 104 mmol/L (ref 98–111)
Creatinine: 0.94 mg/dL (ref 0.61–1.24)
GFR, Estimated: >60 mL/min (ref >60)
Glucose, Bld: 91 mg/dL (ref 70–99)
Potassium: 4 mmol/L (ref 3.5–5.1)
Sodium: 137 mmol/L (ref 135–145)
Total Bilirubin: 0.8 mg/dL (ref 0.3–1.2)
Total Protein: 7.5 g/dL (ref 6.5–8.1)

## 2023-01-08 MED ORDER — SODIUM CHLORIDE 0.9 % IV SOLN: Freq: Once | INTRAVENOUS | Status: AC

## 2023-01-08 MED ORDER — PALONOSETRON HCL INJECTION 0.25 MG/5ML
0.2500 mg | Freq: Once | INTRAVENOUS | Status: AC
  Administered 2023-01-08: 0.25 mg via INTRAVENOUS
  Filled 2023-01-08: qty 5

## 2023-01-08 MED ORDER — SODIUM CHLORIDE 0.9 % IV SOLN
10.0000 mg | Freq: Once | INTRAVENOUS | Status: AC
  Administered 2023-01-08: 10 mg via INTRAVENOUS
  Filled 2023-01-08: qty 1
  Filled 2023-01-08: qty 10

## 2023-01-08 MED ORDER — SODIUM CHLORIDE 0.9 % IV SOLN
155.4000 mg | Freq: Once | INTRAVENOUS | Status: AC
  Administered 2023-01-08: 150 mg via INTRAVENOUS
  Filled 2023-01-08: qty 15

## 2023-01-08 MED ORDER — FAMOTIDINE IN NACL 20-0.9 MG/50ML-% IV SOLN
20.0000 mg | Freq: Once | INTRAVENOUS | Status: AC
  Administered 2023-01-08: 20 mg via INTRAVENOUS
  Filled 2023-01-08: qty 50

## 2023-01-08 MED ORDER — DIPHENHYDRAMINE HCL 50 MG/ML IJ SOLN
25.0000 mg | Freq: Once | INTRAMUSCULAR | Status: AC
  Administered 2023-01-08: 25 mg via INTRAVENOUS
  Filled 2023-01-08: qty 1

## 2023-01-08 MED ORDER — SODIUM CHLORIDE 0.9 % IV SOLN
45.0000 mg/m2 | Freq: Once | INTRAVENOUS | Status: AC
  Administered 2023-01-08: 78 mg via INTRAVENOUS
  Filled 2023-01-08: qty 13

## 2023-01-08 NOTE — Patient Instructions (Signed)
[facility]  Discharge Instructions: Thank you for choosing [facility] to provide your oncology and hematology care.   If you have a lab appointment with the [facility], please go directly to the [facility] and check in at the registration area.   Wear comfortable clothing and clothing appropriate for easy access to any Portacath or PICC line.   We strive to give you quality time with your provider. You may need to reschedule your appointment if you arrive late (15 or more minutes).  Arriving late affects you and other patients whose appointments are after yours.  Also, if you miss three or more appointments without notifying the office, you may be dismissed from the clinic at the provider's discretion.      For prescription refill requests, have your pharmacy contact our office and allow 72 hours for refills to be completed.    Today you received the following chemotherapy and/or immunotherapy agents paclitaxel, carboplatin      To help prevent nausea and vomiting after your treatment, we encourage you to take your nausea medication as directed.  BELOW ARE SYMPTOMS THAT SHOULD BE REPORTED IMMEDIATELY: *FEVER GREATER THAN 100.4 F (38 C) OR HIGHER *CHILLS OR SWEATING *NAUSEA AND VOMITING THAT IS NOT CONTROLLED WITH YOUR NAUSEA MEDICATION *UNUSUAL SHORTNESS OF BREATH *UNUSUAL BRUISING OR BLEEDING *URINARY PROBLEMS (pain or burning when urinating, or frequent urination) *BOWEL PROBLEMS (unusual diarrhea, constipation, pain near the anus) TENDERNESS IN MOUTH AND THROAT WITH OR WITHOUT PRESENCE OF ULCERS (sore throat, sores in mouth, or a toothache) UNUSUAL RASH, SWELLING OR PAIN  UNUSUAL VAGINAL DISCHARGE OR ITCHING   Items with * indicate a potential emergency and should be followed up as soon as possible or go to the Emergency Department if any problems should occur.  Please show the CHEMOTHERAPY ALERT CARD or IMMUNOTHERAPY ALERT  CARD at check-in to the Emergency Department and triage nurse.  Should you have questions after your visit or need to cancel or reschedule your appointment, please contact [facility]  Dept: [phone]  and follow the prompts.  Office hours are 8:00 a.m. to 4:30 p.m. Monday - Friday. Please note that voicemails left after 4:00 p.m. may not be returned until the following business day.  We are closed weekends and major holidays. You have access to a nurse at all times for urgent questions. Please call the main number to the clinic Dept: [phone] and follow the prompts.   For any non-urgent questions, you may also contact your provider using MyChart. We now offer e-Visits for anyone 63 and older to request care online for non-urgent symptoms. For details visit [email or web address].   Also download the MyChart app! Go to the app store, search "MyChart", open the app, select [facility], and log in with your MyChart username and password.

## 2023-01-09 ENCOUNTER — Ambulatory Visit
Admission: RE | Admit: 2023-01-09 | Discharge: 2023-01-09 | Disposition: A | Discharge status: Home / Self Care | Payer: Medicare Other | Source: Ambulatory Visit | Attending: Radiation Oncology | Admitting: Radiation Oncology

## 2023-01-09 ENCOUNTER — Other Ambulatory Visit: Payer: Self-pay

## 2023-01-09 DIAGNOSIS — Z5111 Encounter for antineoplastic chemotherapy: Secondary | ICD-10-CM | POA: Diagnosis not present

## 2023-01-09 DIAGNOSIS — C3411 Malignant neoplasm of upper lobe, right bronchus or lung: Secondary | ICD-10-CM | POA: Diagnosis not present

## 2023-01-09 DIAGNOSIS — Z510 Encounter for antineoplastic radiation therapy: Secondary | ICD-10-CM | POA: Diagnosis not present

## 2023-01-09 LAB — RAD ONC ARIA SESSION SUMMARY
Course Elapsed Days: 15
Plan Fractions Treated to Date: 12
Plan Prescribed Dose Per Fraction: 2 Gy
Plan Total Fractions Prescribed: 30
Plan Total Prescribed Dose: 60 Gy
Reference Point Dosage Given to Date: 24 Gy
Reference Point Session Dosage Given: 2 Gy
Session Number: 12

## 2023-01-10 ENCOUNTER — Other Ambulatory Visit: Payer: Self-pay

## 2023-01-10 ENCOUNTER — Ambulatory Visit
Admission: RE | Admit: 2023-01-10 | Discharge: 2023-01-10 | Disposition: A | Discharge status: Home / Self Care | Payer: Medicare Other | Source: Ambulatory Visit | Attending: Radiation Oncology | Admitting: Radiation Oncology

## 2023-01-10 DIAGNOSIS — C3411 Malignant neoplasm of upper lobe, right bronchus or lung: Secondary | ICD-10-CM | POA: Diagnosis not present

## 2023-01-10 DIAGNOSIS — Z510 Encounter for antineoplastic radiation therapy: Secondary | ICD-10-CM | POA: Diagnosis not present

## 2023-01-10 DIAGNOSIS — Z5111 Encounter for antineoplastic chemotherapy: Secondary | ICD-10-CM | POA: Diagnosis not present

## 2023-01-10 LAB — RAD ONC ARIA SESSION SUMMARY
Course Elapsed Days: 16
Plan Fractions Treated to Date: 13
Plan Prescribed Dose Per Fraction: 2 Gy
Plan Total Fractions Prescribed: 30
Plan Total Prescribed Dose: 60 Gy
Reference Point Dosage Given to Date: 26 Gy
Reference Point Session Dosage Given: 2 Gy
Session Number: 13

## 2023-01-11 ENCOUNTER — Ambulatory Visit
Admission: RE | Admit: 2023-01-11 | Discharge: 2023-01-11 | Disposition: A | Discharge status: Home / Self Care | Payer: Medicare Other | Source: Ambulatory Visit | Attending: Radiation Oncology | Admitting: Radiation Oncology

## 2023-01-11 ENCOUNTER — Other Ambulatory Visit: Payer: Self-pay

## 2023-01-11 DIAGNOSIS — Z5111 Encounter for antineoplastic chemotherapy: Secondary | ICD-10-CM | POA: Diagnosis not present

## 2023-01-11 DIAGNOSIS — C3411 Malignant neoplasm of upper lobe, right bronchus or lung: Secondary | ICD-10-CM | POA: Diagnosis not present

## 2023-01-11 DIAGNOSIS — S8261XA Displaced fracture of lateral malleolus of right fibula, initial encounter for closed fracture: Secondary | ICD-10-CM | POA: Diagnosis not present

## 2023-01-11 DIAGNOSIS — Z510 Encounter for antineoplastic radiation therapy: Secondary | ICD-10-CM | POA: Diagnosis not present

## 2023-01-11 LAB — RAD ONC ARIA SESSION SUMMARY
Course Elapsed Days: 17
Plan Fractions Treated to Date: 14
Plan Prescribed Dose Per Fraction: 2 Gy
Plan Total Fractions Prescribed: 30
Plan Total Prescribed Dose: 60 Gy
Reference Point Dosage Given to Date: 28 Gy
Reference Point Session Dosage Given: 2 Gy
Session Number: 14

## 2023-01-12 ENCOUNTER — Other Ambulatory Visit: Payer: Self-pay

## 2023-01-12 ENCOUNTER — Ambulatory Visit
Admission: RE | Admit: 2023-01-12 | Discharge: 2023-01-12 | Disposition: A | Discharge status: Home / Self Care | Payer: Medicare Other | Source: Ambulatory Visit | Attending: Radiation Oncology | Admitting: Radiation Oncology

## 2023-01-12 ENCOUNTER — Other Ambulatory Visit: Payer: Self-pay | Admitting: Radiation Oncology

## 2023-01-12 DIAGNOSIS — K208 Other esophagitis without bleeding: Secondary | ICD-10-CM | POA: Insufficient documentation

## 2023-01-12 DIAGNOSIS — C3411 Malignant neoplasm of upper lobe, right bronchus or lung: Secondary | ICD-10-CM | POA: Insufficient documentation

## 2023-01-12 DIAGNOSIS — Z5111 Encounter for antineoplastic chemotherapy: Secondary | ICD-10-CM | POA: Diagnosis not present

## 2023-01-12 DIAGNOSIS — Z510 Encounter for antineoplastic radiation therapy: Secondary | ICD-10-CM | POA: Insufficient documentation

## 2023-01-12 LAB — RAD ONC ARIA SESSION SUMMARY
Course Elapsed Days: 18
Plan Fractions Treated to Date: 15
Plan Prescribed Dose Per Fraction: 2 Gy
Plan Total Fractions Prescribed: 30
Plan Total Prescribed Dose: 60 Gy
Reference Point Dosage Given to Date: 30 Gy
Reference Point Session Dosage Given: 2 Gy
Session Number: 15

## 2023-01-12 MED ORDER — SUCRALFATE 1 G PO TABS: 1.0000 g | ORAL_TABLET | Freq: Four times a day (QID) | ORAL | 2 refills | Status: DC

## 2023-01-12 MED FILL — Dexamethasone Sodium Phosphate Inj 100 MG/10ML: INTRAMUSCULAR | Qty: 1 | Status: AC

## 2023-01-13 ENCOUNTER — Other Ambulatory Visit: Payer: Self-pay

## 2023-01-14 ENCOUNTER — Other Ambulatory Visit: Payer: Self-pay

## 2023-01-15 ENCOUNTER — Ambulatory Visit
Admission: RE | Admit: 2023-01-15 | Discharge: 2023-01-15 | Disposition: A | Discharge status: Home / Self Care | Payer: Medicare Other | Source: Ambulatory Visit | Attending: Radiation Oncology | Admitting: Radiation Oncology

## 2023-01-15 ENCOUNTER — Encounter: Payer: Self-pay | Admitting: Internal Medicine

## 2023-01-15 ENCOUNTER — Inpatient Hospital Stay: Payer: Medicare Other

## 2023-01-15 ENCOUNTER — Inpatient Hospital Stay (HOSPITAL_BASED_OUTPATIENT_CLINIC_OR_DEPARTMENT_OTHER): Payer: Medicare Other | Admitting: Physician Assistant

## 2023-01-15 ENCOUNTER — Other Ambulatory Visit: Payer: Self-pay | Admitting: Internal Medicine

## 2023-01-15 ENCOUNTER — Encounter: Payer: Self-pay | Admitting: Medical Oncology

## 2023-01-15 ENCOUNTER — Other Ambulatory Visit: Payer: Self-pay

## 2023-01-15 ENCOUNTER — Inpatient Hospital Stay (HOSPITAL_BASED_OUTPATIENT_CLINIC_OR_DEPARTMENT_OTHER): Payer: Medicare Other | Admitting: Internal Medicine

## 2023-01-15 VITALS — BP 132/77 | HR 87 | Temp 98.6°F | Resp 16 | Wt 145.3 lb

## 2023-01-15 VITALS — BP 127/77 | HR 93 | Temp 97.9°F | Resp 16

## 2023-01-15 DIAGNOSIS — Z510 Encounter for antineoplastic radiation therapy: Secondary | ICD-10-CM | POA: Diagnosis not present

## 2023-01-15 DIAGNOSIS — Z5111 Encounter for antineoplastic chemotherapy: Secondary | ICD-10-CM | POA: Insufficient documentation

## 2023-01-15 DIAGNOSIS — C3411 Malignant neoplasm of upper lobe, right bronchus or lung: Secondary | ICD-10-CM | POA: Insufficient documentation

## 2023-01-15 DIAGNOSIS — R0602 Shortness of breath: Secondary | ICD-10-CM

## 2023-01-15 DIAGNOSIS — K208 Other esophagitis without bleeding: Secondary | ICD-10-CM | POA: Insufficient documentation

## 2023-01-15 DIAGNOSIS — C3491 Malignant neoplasm of unspecified part of right bronchus or lung: Secondary | ICD-10-CM

## 2023-01-15 DIAGNOSIS — R911 Solitary pulmonary nodule: Secondary | ICD-10-CM

## 2023-01-15 DIAGNOSIS — T451X5A Adverse effect of antineoplastic and immunosuppressive drugs, initial encounter: Secondary | ICD-10-CM

## 2023-01-15 LAB — CBC WITH DIFFERENTIAL/PLATELET
Abs Immature Granulocytes: 0.03 10*3/uL (ref 0.00–0.07)
Basophils Absolute: 0 10*3/uL (ref 0.0–0.1)
Basophils Relative: 1 %
Eosinophils Absolute: 0.1 10*3/uL (ref 0.0–0.5)
Eosinophils Relative: 2 %
HCT: 43.9 % (ref 39.0–52.0)
Hemoglobin: 15.5 g/dL (ref 13.0–17.0)
Immature Granulocytes: 1 %
Lymphocytes Relative: 11 %
Lymphs Abs: 0.4 10*3/uL — ABNORMAL LOW (ref 0.7–4.0)
MCH: 29.5 pg (ref 26.0–34.0)
MCHC: 35.3 g/dL (ref 30.0–36.0)
MCV: 83.6 fL (ref 80.0–100.0)
Monocytes Absolute: 0.3 10*3/uL (ref 0.1–1.0)
Monocytes Relative: 7 %
Neutro Abs: 2.7 10*3/uL (ref 1.7–7.7)
Neutrophils Relative %: 78 %
Platelets: 227 10*3/uL (ref 150–400)
RBC: 5.25 MIL/uL (ref 4.22–5.81)
RDW: 12.3 % (ref 11.5–15.5)
WBC: 3.5 10*3/uL — ABNORMAL LOW (ref 4.0–10.5)
nRBC: 0 % (ref 0.0–0.2)

## 2023-01-15 LAB — RAD ONC ARIA SESSION SUMMARY
Course Elapsed Days: 21
Plan Fractions Treated to Date: 16
Plan Prescribed Dose Per Fraction: 2 Gy
Plan Total Fractions Prescribed: 30
Plan Total Prescribed Dose: 60 Gy
Reference Point Dosage Given to Date: 32 Gy
Reference Point Session Dosage Given: 2 Gy
Session Number: 16

## 2023-01-15 LAB — COMPREHENSIVE METABOLIC PANEL
ALT: 28 U/L (ref 0–44)
AST: 25 U/L (ref 15–41)
Albumin: 4.2 g/dL (ref 3.5–5.0)
Alkaline Phosphatase: 75 U/L (ref 38–126)
Anion gap: 7 (ref 5–15)
BUN: 20 mg/dL (ref 8–23)
CO2: 25 mmol/L (ref 22–32)
Calcium: 9.5 mg/dL (ref 8.9–10.3)
Chloride: 105 mmol/L (ref 98–111)
Creatinine, Ser: 0.99 mg/dL (ref 0.61–1.24)
GFR, Estimated: >60 mL/min (ref >60)
Glucose, Bld: 94 mg/dL (ref 70–99)
Potassium: 4.1 mmol/L (ref 3.5–5.1)
Sodium: 137 mmol/L (ref 135–145)
Total Bilirubin: 1 mg/dL (ref 0.3–1.2)
Total Protein: 7.3 g/dL (ref 6.5–8.1)

## 2023-01-15 MED ORDER — DIPHENHYDRAMINE HCL 50 MG/ML IJ SOLN
25.0000 mg | Freq: Once | INTRAMUSCULAR | Status: AC
  Administered 2023-01-15: 25 mg via INTRAVENOUS
  Filled 2023-01-15: qty 1

## 2023-01-15 MED ORDER — PALONOSETRON HCL INJECTION 0.25 MG/5ML
0.2500 mg | Freq: Once | INTRAVENOUS | Status: AC
  Administered 2023-01-15: 0.25 mg via INTRAVENOUS
  Filled 2023-01-15: qty 5

## 2023-01-15 MED ORDER — SODIUM CHLORIDE 0.9 % IV SOLN
10.0000 mg | Freq: Once | INTRAVENOUS | Status: AC
  Administered 2023-01-15: 10 mg via INTRAVENOUS
  Filled 2023-01-15: qty 10

## 2023-01-15 MED ORDER — FAMOTIDINE IN NACL 20-0.9 MG/50ML-% IV SOLN
20.0000 mg | Freq: Once | INTRAVENOUS | Status: AC
  Administered 2023-01-15: 20 mg via INTRAVENOUS
  Filled 2023-01-15: qty 50

## 2023-01-15 MED ORDER — SODIUM CHLORIDE 0.9 % IV SOLN: Freq: Once | INTRAVENOUS | Status: DC | PRN

## 2023-01-15 MED ORDER — FAMOTIDINE IN NACL 20-0.9 MG/50ML-% IV SOLN
20.0000 mg | Freq: Once | INTRAVENOUS | Status: AC | PRN
  Administered 2023-01-15: 20 mg via INTRAVENOUS

## 2023-01-15 MED ORDER — SODIUM CHLORIDE 0.9 % IV SOLN
155.4000 mg | Freq: Once | INTRAVENOUS | Status: AC
  Administered 2023-01-15: 150 mg via INTRAVENOUS
  Filled 2023-01-15: qty 15

## 2023-01-15 MED ORDER — SODIUM CHLORIDE 0.9 % IV SOLN
45.0000 mg/m2 | Freq: Once | INTRAVENOUS | Status: AC
  Administered 2023-01-15: 78 mg via INTRAVENOUS
  Filled 2023-01-15: qty 13

## 2023-01-15 MED ORDER — SODIUM CHLORIDE 0.9 % IV SOLN: Freq: Once | INTRAVENOUS | Status: AC

## 2023-01-15 NOTE — Progress Notes (Signed)
Alan Mendez:(336) [phone]   Fax:([phone]) [phone]  OFFICE PROGRESS NOTE  [name], Alan Him, MD [place] [address]  DIAGNOSIS: Stage IIIb (T3, N2, M0) non-small cell lung cancer, squamous cell carcinoma presented with 2 separate right upper lobe lung nodule in addition to mediastinal lymphadenopathy diagnosed in January 2024  PRIOR THERAPY: None  CURRENT THERAPY: A course of concurrent chemoradiation with weekly carboplatin for AUC of 2 and paclitaxel 45 Mg/M2.  First dose expected January 02, 2023.  Status post 2 cycles.  INTERVAL HISTORY: Alan Mendez 82 y.o. male returns to the clinic today for follow-up visit accompanied by his girlfriend Alan Mendez.  The patient is feeling fine today with no concerning complaints except for mild soreness of his throat secondary to radiation-induced esophagitis.  He denied having any chest pain, shortness of breath, cough or hemoptysis.  He has no nausea, vomiting, diarrhea or constipation.  He has no headache or visual changes.  He is here today for evaluation before starting cycle #3 of his treatment.  MEDICAL HISTORY: Past Medical History:  Diagnosis Date   Acquired solitary kidney    left --  s/p  right nephroureterctomy 01/ 2018   Anxiety    no current problems   Depression    no current problems   Elevated PSA    Enlarged lymph node 11/2022   enlarged right paratracheal lymph node.   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, negative margins)   History of kidney stones    passed stones and also surgery to remove   History of unilateral nephrectomy    01/ 2018  right nephroureterectomy for renal pelvis mass (carcinoma in situ)   HLD (hyperlipidemia)    on crestor    Hyperplasia of prostate without lower urinary tract symptoms (LUTS)    Lung cancer ([facility]) 12/07/2022   Nephrolithiasis    bilateral non-obstructive   Pneumonia    x 1 - yrs ago   Pulmonary nodules 11/2022   2 right upper lobe pulmonary nodules   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HCC)    Renal cyst, acquired, right    right kidney removed   Wears glasses     ALLERGIES:  is allergic to lamisil [terbinafine].  MEDICATIONS:  Current Outpatient Medications  Medication Sig Dispense Refill   cholecalciferol (VITAMIN D3) 25 MCG (1000 UNIT) tablet Take 1,000 Units by mouth daily.     Multiple Vitamins-Minerals (MULTIVITAMIN WITH MINERALS) tablet Take 1 tablet by mouth daily. Centrum     prochlorperazine (COMPAZINE) 10 MG tablet Take 10 mg by mouth every 6 (six) hours as needed for nausea or vomiting.     rosuvastatin (CRESTOR) 10 MG tablet TAKE 1 TABLET EACH DAY. (Patient taking differently: Take 20 mg by mouth daily.) 30 tablet 0   sucralfate (CARAFATE) 1 g tablet Take 1 tablet (1 g total) by mouth 4 (four) times daily. Dissolve each tablet in 15 cc water before use. 120 tablet 2   tamsulosin (FLOMAX) 0.4 MG CAPS capsule Take 1 capsule (0.4 mg total) by mouth daily. (Patient taking differently: Take 0.4 mg by mouth daily after breakfast.) 30 capsule 5   No current facility-administered medications for this visit.    SURGICAL HISTORY:  Past Surgical History:  Procedure Laterality Date   APPENDECTOMY  child   BRONCHIAL BIOPSY  12/07/2022   Procedure: BRONCHIAL BIOPSIES;  Surgeon: Garner Nash, DO;  Location: [facility];  Service: Pulmonary;;   BRONCHIAL BRUSHINGS  12/07/2022   Procedure: BRONCHIAL BRUSHINGS;  Surgeon: Garner Nash, DO;  Location: [facility];  Service: Pulmonary;;   BRONCHIAL NEEDLE ASPIRATION BIOPSY  12/07/2022   Procedure: BRONCHIAL NEEDLE ASPIRATION BIOPSIES;  Surgeon: Garner Nash, DO;  Location: [facility];  Service: Pulmonary;;   CATARACT  EXTRACTION W/ INTRAOCULAR LENS  IMPLANT, BILATERAL  2017   COLONOSCOPY  08/15/2012   Tubular Adenoma, No high grade dysplasia or malignacy.   CYSTOSCOPY W/ RETROGRADES Right 09/25/2016   Procedure: CYSTOSCOPY, URETHRAL DILITATION  WITH RETROGRADE PYELOGRAM, BRUSH BIOPSIES OF RIGHT RENAL MASS;  Surgeon: Carolan Clines, MD;  Location: [facility];  Service: Urology;  Laterality: Right;   CYSTOSCOPY W/ RETROGRADES Left 03/16/2017   Procedure: CYSTOSCOPY WITH RETROGRADE PYELOGRAM;  Surgeon: Ardis Hughs, MD;  Location: [facility];  Service: Urology;  Laterality: Left;   CYSTOSCOPY W/ URETERAL STENT REMOVAL Right 09/25/2016   Procedure: CYSTOSCOPY WITH STENT REMOVAL;  Surgeon: Carolan Clines, MD;  Location: [facility];  Service: Urology;  Laterality: Right;   CYSTOSCOPY WITH RETROGRADE PYELOGRAM, URETEROSCOPY AND STENT PLACEMENT Right 08/21/2016   Procedure: CYSTOSCOPY WITH RIGHT RETROGRADE PYELOGRAM, RIGHT FLEXIBLE AND RIGID URETEROSCOPY, INSERTION DOUBLE J STENT RIGHT;  Surgeon: Carolan Clines, MD;  Location: [facility];  Service: Urology;  Laterality: Right;   EXTRACORPOREAL SHOCK WAVE LITHOTRIPSY  2009   KNEE SURGERY Right 1980's   ROBOT ASSITED LAPAROSCOPIC NEPHROURETERECTOMY Right 11/15/2016   Procedure: RIGHT XI ROBOT ASSITED LAPAROSCOPIC NEPHROURETERECTOMY;  Surgeon: Ardis Hughs, MD;  Location: [facility];  Service: Urology;  Laterality: Right;   TRANSURETHRAL RESECTION OF BLADDER TUMOR WITH MITOMYCIN-C Left 03/16/2017   Procedure: CYSTOSCOPY BIOPSIES OF BLADDER TUMOR WITH FULGURATION;  Surgeon: Ardis Hughs, MD;  Location: [facility];  Service: Urology;  Laterality: Left;   URETEROSCOPY Right 09/25/2016   Procedure: RIGHT URETEROSCOPY;  Surgeon: Carolan Clines, MD;  Location[facility];  Service: Urology;  Laterality: Right;   VIDEO BRONCHOSCOPY WITH ENDOBRONCHIAL  ULTRASOUND Right 12/07/2022   Procedure: VIDEO BRONCHOSCOPY WITH ENDOBRONCHIAL ULTRASOUND;  Surgeon: Garner Nash, DO;  Location: [facility];  Service: Pulmonary;  Laterality: Right;    REVIEW OF SYSTEMS:  A comprehensive review of systems was negative except for: Constitutional: positive for fatigue Ears, nose, mouth, throat, and face: positive for sore throat   PHYSICAL EXAMINATION: General appearance: alert, cooperative, fatigued, and no distress Head: Normocephalic, without obvious abnormality, atraumatic Neck: no adenopathy, no JVD, supple, symmetrical, trachea midline, and thyroid not enlarged, symmetric, no tenderness/mass/nodules Lymph nodes: Cervical, supraclavicular, and axillary nodes normal. Resp: clear to auscultation bilaterally Back: symmetric, no curvature. ROM normal. No CVA tenderness. Cardio: regular rate and rhythm, S1, S2 normal, no murmur, click, rub or gallop GI: soft, non-tender; bowel sounds normal; no masses,  no organomegaly Extremities: extremities normal, atraumatic, no cyanosis or edema  ECOG PERFORMANCE STATUS: 1 - Symptomatic but completely ambulatory  Blood pressure 132/77, pulse 87, temperature 98.6 F (37 C), temperature source Oral, resp. rate 16, weight 145 lb 4.8 oz (65.9 kg), SpO2 100 %.  LABORATORY DATA: Lab Results  Component Value Date   WBC 4.3 01/08/2023   HGB 15.7 01/08/2023   HCT 45.3 01/08/2023   MCV 83.9 01/08/2023  PLT 224 01/08/2023      Chemistry      Component Value Date/Time   NA 137 01/08/2023 1304   K 4.0 01/08/2023 1304   CL 104 01/08/2023 1304   CO2 24 01/08/2023 1304   BUN 22 01/08/2023 1304   CREATININE 0.94 01/08/2023 1304   CREATININE 0.85 12/10/2014 1550      Component Value Date/Time   CALCIUM 9.3 01/08/2023 1304   ALKPHOS 75 01/08/2023 1304   AST 26 01/08/2023 1304   ALT 28 01/08/2023 1304   BILITOT 0.8 01/08/2023 1304       RADIOGRAPHIC STUDIES: CT ANKLE RIGHT WO CONTRAST  Result Date:  01/05/2023 CLINICAL DATA:  Right ankle fracture.  Recent fall EXAM: CT OF THE RIGHT ANKLE WITHOUT CONTRAST TECHNIQUE: Multidetector CT imaging of the right ankle was performed according to the standard protocol. Multiplanar CT image reconstructions were also generated. RADIATION DOSE REDUCTION: This exam was performed according to the departmental dose-optimization program which includes automated exposure control, adjustment of the mA and/or kV according to patient size and/or use of iterative reconstruction technique. COMPARISON:  X-ray 12/24/2022 FINDINGS: Bones/Joint/Cartilage Acute obliquely oriented fracture of the distal fibular metaphysis with intra-articular extension to the distal tibiofibular joint and ankle mortise. Mild posterolateral displacement of 3 mm. Subtle fragmentation along the anterolateral margin of the tibial plafond suspicious for tiny avulsion fragments (series 3, image 48). Subtle nondisplaced posterior malleolar fracture (series 9, image 32). No medial malleolar fracture. Slight asymmetric widening of the lateral clear space. No tibiotalar dislocation. Moderate tibiotalar joint effusion/hemarthrosis. Small os trigonum. Bones of the hindfoot and midfoot are intact. No fracture or malalignment. Small plantar calcaneal spur. Suspected anterior Ligaments Suboptimally assessed by CT. Tibiofibular ligament avulsion injury. Soft tissue thickening in the expected locations of the anterior and posterior talofibular ligaments, presumably posttraumatic. Muscles and Tendons No acute musculotendinous abnormality by CT. Soft tissues Diffuse soft tissue swelling and edema of the ankle and dorsal foot. No soft tissue air to suggest open fracture. IMPRESSION: 1. Acute mildly displaced fracture of the distal fibular metaphysis. 2. Subtle nondisplaced posterior malleolar fracture. 3. Subtle fragmentation along the anterolateral margin of the tibial plafond suspicious for tiny avulsion fragments. 4. Slight  asymmetric widening of the lateral clear space. 5. Moderate tibiotalar joint effusion/hemarthrosis. 6. Diffuse soft tissue swelling and edema of the ankle and dorsal foot. Electronically Signed   By: Davina Poke D.O.   On: 01/05/2023 14:40   ECHOCARDIOGRAM COMPLETE  Result Date: 12/25/2022    ECHOCARDIOGRAM REPORT   Patient Name:   Alan Mendez Date of Exam: 12/25/2022 Medical Rec #:  [id number]      Height:       65.0 in Accession #:    [id number]     Weight:       147.3 lb Date of Birth:  [birth year]       BSA:          1.737 m Patient Age:    66 years       BP:           139/74 mmHg Patient Gender: M              HR:           66 bpm. Exam Location:  Inpatient Procedure: 2D Echo, Cardiac Doppler and Color Doppler Indications:    syncope  History:        Patient has no prior history of Echocardiogram examinations.  Risk Factors:Dyslipidemia.  Sonographer:    Phineas Douglas Referring Phys: [id number] [place]  1. Left ventricular ejection fraction, by estimation, is 60 to 65%. The left ventricle has normal function. The left ventricle has no regional wall motion abnormalities. Left ventricular diastolic parameters were normal.  2. Right ventricular systolic function is normal. The right ventricular size is normal. There is normal pulmonary artery systolic pressure.  3. The mitral valve is normal in structure. Trivial mitral valve regurgitation. No evidence of mitral stenosis.  4. The aortic valve is tricuspid. There is mild calcification of the aortic valve. There is mild thickening of the aortic valve. Aortic valve regurgitation is trivial.  5. The inferior vena cava is normal in size with greater than 50% respiratory variability, suggesting right atrial pressure of 3 mmHg. Comparison(s): No prior Echocardiogram. Conclusion(s)/Recommendation(s): Normal biventricular function without evidence of hemodynamically significant valvular heart disease. FINDINGS  Left Ventricle: Left  ventricular ejection fraction, by estimation, is 60 to 65%. The left ventricle has normal function. The left ventricle has no regional wall motion abnormalities. The left ventricular internal cavity size was normal in size. There is  borderline left ventricular hypertrophy. Left ventricular diastolic parameters were normal. Right Ventricle: The right ventricular size is normal. Right vetricular wall thickness was not well visualized. Right ventricular systolic function is normal. There is normal pulmonary artery systolic pressure. The tricuspid regurgitant velocity is 2.52 m/s, and with an assumed right atrial pressure of 3 mmHg, the estimated right ventricular systolic pressure is [id number] mmHg. Left Atrium: Left atrial size was normal in size. Right Atrium: Right atrial size was normal in size. Pericardium: There is no evidence of pericardial effusion. Mitral Valve: The mitral valve is normal in structure. Trivial mitral valve regurgitation. No evidence of mitral valve stenosis. Tricuspid Valve: The tricuspid valve is normal in structure. Tricuspid valve regurgitation is mild . No evidence of tricuspid stenosis. Aortic Valve: The aortic valve is tricuspid. There is mild calcification of the aortic valve. There is mild thickening of the aortic valve. Aortic valve regurgitation is trivial. Aortic regurgitation PHT measures 577 msec. Pulmonic Valve: The pulmonic valve was grossly normal. Pulmonic valve regurgitation is trivial. No evidence of pulmonic stenosis. Aorta: The aortic root, ascending aorta, aortic arch and descending aorta are all structurally normal, with no evidence of dilitation or obstruction. Venous: The inferior vena cava is normal in size with greater than 50% respiratory variability, suggesting right atrial pressure of 3 mmHg. IAS/Shunts: The atrial septum is grossly normal.  LEFT VENTRICLE PLAX 2D LVIDd:         4.20 cm     Diastology LVIDs:         2.70 cm     LV e' medial:    7.83 cm/s LV PW:          1.10 cm     LV E/e' medial:  7.9 LV IVS:        1.20 cm     LV e' lateral:   8.93 cm/s LVOT diam:     1.90 cm     LV E/e' lateral: 7.0 LV SV:         47 LV SV Index:   27 LVOT Area:     2.84 cm  LV Volumes (MOD) LV vol d, MOD A2C: 99.0 ml LV vol d, MOD A4C: 85.9 ml LV vol s, MOD A2C: 40.5 ml LV vol s, MOD A4C: 38.6 ml LV SV MOD A2C:  58.5 ml LV SV MOD A4C:     85.9 ml LV SV MOD BP:      52.4 ml RIGHT VENTRICLE             IVC RV Basal diam:  3.90 cm     IVC diam: 1.80 cm RV S prime:     16.40 cm/s TAPSE (M-mode): 2.1 cm LEFT ATRIUM             Index        RIGHT ATRIUM           Index LA diam:        3.20 cm 1.84 cm/m   RA Area:     13.50 cm LA Vol (A2C):   51.1 ml 29.42 ml/m  RA Volume:   28.90 ml  16.64 ml/m LA Vol (A4C):   37.0 ml 21.30 ml/m LA Biplane Vol: 43.3 ml 24.93 ml/m  AORTIC VALVE             PULMONIC VALVE LVOT Vmax:   79.20 cm/s  PR End Diast Vel: 1.63 msec LVOT Vmean:  52.800 cm/s LVOT VTI:    0.165 m AI PHT:      577 msec  AORTA Ao Root diam: 3.10 cm Ao Asc diam:  3.00 cm MITRAL VALVE               TRICUSPID VALVE MV Area (PHT): 3.31 cm    TR Peak grad:   25.4 mmHg MV Decel Time: 229 msec    TR Vmax:        252.00 cm/s MV E velocity: 62.10 cm/s MV A velocity: 72.00 cm/s  SHUNTS MV E/A ratio:  0.86        Systemic VTI:  0.16 m                            Systemic Diam: 1.90 cm Buford Dresser MD Electronically signed by Buford Dresser MD Signature Date/Time: 12/25/2022/3:46:29 PM    Final    DG Ankle Complete Right  Result Date: 12/24/2022 CLINICAL DATA:  Trauma to the lateral right ankle. EXAM: RIGHT ANKLE - COMPLETE 3+ VIEW COMPARISON:  None Available. FINDINGS: Mildly displaced fracture of the distal fibula with approximately 4 mm lateral displacement of the distal fracture fragment. No other acute fracture. There is no dislocation. The ankle mortise is intact. Mild soft tissue swelling over the lateral malleolus. No acute foreign object or soft tissue gas. IMPRESSION:  Mildly displaced fracture of the distal fibula. Electronically Signed   By: Anner Crete M.D.   On: 12/24/2022 21:12   DG Chest Portable 1 View  Result Date: 12/24/2022 CLINICAL DATA:  Syncope EXAM: PORTABLE CHEST 1 VIEW COMPARISON:  Chest x-ray 12/07/2022 FINDINGS: The heart size and mediastinal contours are within normal limits. Both lungs are clear. Again seen is scoliosis of the thoracic spine. No acute fractures are seen. IMPRESSION: No active disease. Electronically Signed   By: Ronney Asters M.D.   On: 12/24/2022 19:06    ASSESSMENT AND PLAN: This is a very pleasant 82 years old white male with Stage IIIb (T3, N2, M0) non-small cell lung cancer, squamous cell carcinoma presented with 2 separate right upper lobe lung nodule in addition to mediastinal lymphadenopathy diagnosed in January 2024. Patient had a recent MRI of the brain as well as a PET scan.  I personally and independently reviewed the imaging and discussed the result with the patient  and his fiance today. The patient has no evidence of metastatic disease to the brain or extrathoracic metastasis besides the 2 right upper lobe pulmonary nodule and mediastinal lymphadenopathy. I discussed his case with Dr. Roxan Hockey, cardiothoracic surgery and he indicated that because of the location of the lymph node, the patient will not be a great candidate for surgical resection with negative margin. The patient is currently undergoing a course of concurrent chemoradiation with weekly carboplatin for AUC of 2 and paclitaxel 45 Mg/M2.  Status post 2 cycles.  The patient has been tolerating this treatment well with no concerning adverse effects. I recommended for Mendez to proceed with cycle #3 today as planned. I will see Mendez back for follow-up visit in 2 weeks for evaluation before starting cycle #5. The patient was advised to call immediately if he has any other concerning symptoms in the interval. The patient voices understanding of current  disease status and treatment options and is in agreement with the current care plan.  All questions were answered. The patient knows to call the clinic with any problems, questions or concerns. We can certainly see the patient much sooner if necessary.  The total time spent in the appointment was 20 minutes.  Disclaimer: This note was dictated with voice recognition software. Similar sounding words can inadvertently be transcribed and may not be corrected upon review.

## 2023-01-15 NOTE — Progress Notes (Signed)
    DATE:  01/15/23                                        X CHEMO/IMMUNOTHERAPY REACTION             MD: Julien Nordmann   AGENT/BLOOD PRODUCT RECEIVING TODAY:              carboplatin and paclitaxel   AGENT/BLOOD PRODUCT RECEIVING IMMEDIATELY PRIOR TO REACTION:          Paclitaxel   VS: BP:     128/82   P:       92       SPO2:       99%  RA              BP:     153/89   P:       87       SPO2:       100 % RA     REACTION(S):           nausea, shortness of breath, facial flushing, diaphoresis   PREMEDS:     Benadryl 25 mg IV, Aloxi 0.25 mg IV, Pepcid 20 mg IV, Decadron 10 mg IV   INTERVENTION: Pepcid 20 mg IV   Review of Systems  Review of Systems  Constitutional:  Positive for diaphoresis.  Respiratory:  Positive for shortness of breath.   Gastrointestinal:  Positive for nausea.  Skin:  Positive for color change.  All other systems reviewed and are negative.    Physical Exam  Physical Exam Vitals and nursing note reviewed.  Constitutional:      Appearance: He is well-developed. He is diaphoretic. He is not ill-appearing or toxic-appearing.     Comments: Facial flushing  HENT:     Head: Normocephalic.     Nose: Nose normal.  Eyes:     Conjunctiva/sclera: Conjunctivae normal.  Neck:     Vascular: No JVD.  Cardiovascular:     Rate and Rhythm: Normal rate and regular rhythm.     Pulses: Normal pulses.     Heart sounds: Normal heart sounds.  Pulmonary:     Effort: Pulmonary effort is normal.     Breath sounds: Normal breath sounds. No stridor. No wheezing, rhonchi or rales.  Chest:     Chest wall: No tenderness.  Abdominal:     General: There is no distension.  Musculoskeletal:     Cervical back: Normal range of motion.  Skin:    General: Skin is warm.  Neurological:     Mental Status: He is oriented to person, place, and time.     OUTCOME:              Patient with severe reaction during 3rd taxol treatment. Symptoms resolved after emergency medication given as  documented above. Discussed patient with primary oncology team who will discontinue Taxol and switch to Abraxane.  Patient tolerated carboplatin infusion without any difficulty.  He was discharged in stable condition.

## 2023-01-15 NOTE — Progress Notes (Signed)
Hypersensitivity Reaction note  Date of event: 01/15/23 Time of event: 1106 Generic name of drug involved: paclitaxel Name of provider notified of the hypersensitivity reaction: Eloise Harman. PA-C Was agent that likely caused hypersensitivity reaction added to Allergies List within EMR? yes Chain of events including reaction signs/symptoms, treatment administered, and outcome (e.g., drug resumed; drug discontinued; sent to Emergency Department; etc.)  Patient c/o severe nausea, SHOB, RN noted severe flushing of face and neck. Infusion paused and fluids started. See MAR for medication administration and see flowsheets for vitals. Due to severity of symptoms, medication discontinued per Dr Julien Nordmann. Patient returned to baseline and remainder of chemotherapies administered.   Clyda Hurdle, RN 01/15/2023 12:34 PM

## 2023-01-15 NOTE — Patient Instructions (Addendum)
[facility]  Discharge Instructions: Thank you for choosing [facility] to provide your oncology and hematology care.   If you have a lab appointment with the [facility], please go directly to the [facility] and check in at the registration area.   Wear comfortable clothing and clothing appropriate for easy access to any Portacath or PICC line.   We strive to give you quality time with your provider. You may need to reschedule your appointment if you arrive late (15 or more minutes).  Arriving late affects you and other patients whose appointments are after yours.  Also, if you miss three or more appointments without notifying the office, you may be dismissed from the clinic at the provider's discretion.      For prescription refill requests, have your pharmacy contact our office and allow 72 hours for refills to be completed.    Today you received the following chemotherapy and/or immunotherapy agents: paclitaxel (discontinued after reaction today) and carboplatin.      To help prevent nausea and vomiting after your treatment, we encourage you to take your nausea medication as directed.  BELOW ARE SYMPTOMS THAT SHOULD BE REPORTED IMMEDIATELY: *FEVER GREATER THAN 100.4 F (38 C) OR HIGHER *CHILLS OR SWEATING *NAUSEA AND VOMITING THAT IS NOT CONTROLLED WITH YOUR NAUSEA MEDICATION *UNUSUAL SHORTNESS OF BREATH *UNUSUAL BRUISING OR BLEEDING *URINARY PROBLEMS (pain or burning when urinating, or frequent urination) *BOWEL PROBLEMS (unusual diarrhea, constipation, pain near the anus) TENDERNESS IN MOUTH AND THROAT WITH OR WITHOUT PRESENCE OF ULCERS (sore throat, sores in mouth, or a toothache) UNUSUAL RASH, SWELLING OR PAIN  UNUSUAL VAGINAL DISCHARGE OR ITCHING   Items with * indicate a potential emergency and should be followed up as soon as possible or go to the Emergency Department if any problems should occur.  Please show the  CHEMOTHERAPY ALERT CARD or IMMUNOTHERAPY ALERT CARD at check-in to the Emergency Department and triage nurse.  Should you have questions after your visit or need to cancel or reschedule your appointment, please contact [facility]  Dept: [phone]  and follow the prompts.  Office hours are 8:00 a.m. to 4:30 p.m. Monday - Friday. Please note that voicemails left after 4:00 p.m. may not be returned until the following business day.  We are closed weekends and major holidays. You have access to a nurse at all times for urgent questions. Please call the main number to the clinic Dept: [phone] and follow the prompts.   For any non-urgent questions, you may also contact your provider using MyChart. We now offer e-Visits for anyone 75 and older to request care online for non-urgent symptoms. For details visit [email or web address].   Also download the MyChart app! Go to the app store, search "MyChart", open the app, select , and log in with your MyChart username and password.

## 2023-01-15 NOTE — Progress Notes (Signed)
Patient seen by MD today  Vitals are within treatment parameters.  Labs reviewed: and are within treatment parameters.  Per physician team, patient is ready for treatment and there are NO modifications to the treatment plan.

## 2023-01-16 ENCOUNTER — Other Ambulatory Visit: Payer: Self-pay

## 2023-01-16 ENCOUNTER — Ambulatory Visit
Admission: RE | Admit: 2023-01-16 | Discharge: 2023-01-16 | Disposition: A | Discharge status: Home / Self Care | Payer: Medicare Other | Source: Ambulatory Visit | Attending: Radiation Oncology | Admitting: Radiation Oncology

## 2023-01-16 DIAGNOSIS — C3411 Malignant neoplasm of upper lobe, right bronchus or lung: Secondary | ICD-10-CM | POA: Diagnosis not present

## 2023-01-16 DIAGNOSIS — Z510 Encounter for antineoplastic radiation therapy: Secondary | ICD-10-CM | POA: Diagnosis not present

## 2023-01-16 DIAGNOSIS — K208 Other esophagitis without bleeding: Secondary | ICD-10-CM | POA: Diagnosis not present

## 2023-01-16 DIAGNOSIS — Z5111 Encounter for antineoplastic chemotherapy: Secondary | ICD-10-CM | POA: Diagnosis not present

## 2023-01-16 LAB — RAD ONC ARIA SESSION SUMMARY
Course Elapsed Days: 22
Plan Fractions Treated to Date: 17
Plan Prescribed Dose Per Fraction: 2 Gy
Plan Total Fractions Prescribed: 30
Plan Total Prescribed Dose: 60 Gy
Reference Point Dosage Given to Date: 34 Gy
Reference Point Session Dosage Given: 2 Gy
Session Number: 17

## 2023-01-17 ENCOUNTER — Other Ambulatory Visit: Payer: Self-pay

## 2023-01-17 ENCOUNTER — Ambulatory Visit
Admission: RE | Admit: 2023-01-17 | Discharge: 2023-01-17 | Disposition: A | Discharge status: Home / Self Care | Payer: Medicare Other | Source: Ambulatory Visit | Attending: Radiation Oncology | Admitting: Radiation Oncology

## 2023-01-17 DIAGNOSIS — Z510 Encounter for antineoplastic radiation therapy: Secondary | ICD-10-CM | POA: Diagnosis not present

## 2023-01-17 DIAGNOSIS — Z5111 Encounter for antineoplastic chemotherapy: Secondary | ICD-10-CM | POA: Diagnosis not present

## 2023-01-17 DIAGNOSIS — K208 Other esophagitis without bleeding: Secondary | ICD-10-CM | POA: Diagnosis not present

## 2023-01-17 DIAGNOSIS — C3411 Malignant neoplasm of upper lobe, right bronchus or lung: Secondary | ICD-10-CM | POA: Diagnosis not present

## 2023-01-17 LAB — RAD ONC ARIA SESSION SUMMARY
Course Elapsed Days: 23
Plan Fractions Treated to Date: 18
Plan Prescribed Dose Per Fraction: 2 Gy
Plan Total Fractions Prescribed: 30
Plan Total Prescribed Dose: 60 Gy
Reference Point Dosage Given to Date: 36 Gy
Reference Point Session Dosage Given: 2 Gy
Session Number: 18

## 2023-01-18 ENCOUNTER — Other Ambulatory Visit: Payer: Self-pay

## 2023-01-18 ENCOUNTER — Telehealth: Payer: Self-pay | Admitting: Medical Oncology

## 2023-01-18 ENCOUNTER — Ambulatory Visit
Admission: RE | Admit: 2023-01-18 | Discharge: 2023-01-18 | Disposition: A | Discharge status: Home / Self Care | Payer: Medicare Other | Source: Ambulatory Visit | Attending: Radiation Oncology | Admitting: Radiation Oncology

## 2023-01-18 DIAGNOSIS — Z5111 Encounter for antineoplastic chemotherapy: Secondary | ICD-10-CM | POA: Diagnosis not present

## 2023-01-18 DIAGNOSIS — Z510 Encounter for antineoplastic radiation therapy: Secondary | ICD-10-CM | POA: Diagnosis not present

## 2023-01-18 DIAGNOSIS — C3411 Malignant neoplasm of upper lobe, right bronchus or lung: Secondary | ICD-10-CM | POA: Diagnosis not present

## 2023-01-18 DIAGNOSIS — K208 Other esophagitis without bleeding: Secondary | ICD-10-CM | POA: Diagnosis not present

## 2023-01-18 LAB — RAD ONC ARIA SESSION SUMMARY
Course Elapsed Days: 24
Plan Fractions Treated to Date: 19
Plan Prescribed Dose Per Fraction: 2 Gy
Plan Total Fractions Prescribed: 30
Plan Total Prescribed Dose: 60 Gy
Reference Point Dosage Given to Date: 38 Gy
Reference Point Session Dosage Given: 2 Gy
Session Number: 19

## 2023-01-18 NOTE — Telephone Encounter (Signed)
Returning [facility] call.

## 2023-01-19 ENCOUNTER — Telehealth: Payer: Self-pay | Admitting: Internal Medicine

## 2023-01-19 ENCOUNTER — Ambulatory Visit
Admission: RE | Admit: 2023-01-19 | Discharge: 2023-01-19 | Disposition: A | Discharge status: Home / Self Care | Payer: Medicare Other | Source: Ambulatory Visit | Attending: Radiation Oncology | Admitting: Radiation Oncology

## 2023-01-19 ENCOUNTER — Other Ambulatory Visit: Payer: Self-pay

## 2023-01-19 DIAGNOSIS — Z5111 Encounter for antineoplastic chemotherapy: Secondary | ICD-10-CM | POA: Diagnosis not present

## 2023-01-19 DIAGNOSIS — K208 Other esophagitis without bleeding: Secondary | ICD-10-CM | POA: Diagnosis not present

## 2023-01-19 DIAGNOSIS — Z510 Encounter for antineoplastic radiation therapy: Secondary | ICD-10-CM | POA: Diagnosis not present

## 2023-01-19 DIAGNOSIS — C3411 Malignant neoplasm of upper lobe, right bronchus or lung: Secondary | ICD-10-CM | POA: Diagnosis not present

## 2023-01-19 LAB — RAD ONC ARIA SESSION SUMMARY
Course Elapsed Days: 25
Plan Fractions Treated to Date: 20
Plan Prescribed Dose Per Fraction: 2 Gy
Plan Total Fractions Prescribed: 30
Plan Total Prescribed Dose: 60 Gy
Reference Point Dosage Given to Date: 40 Gy
Reference Point Session Dosage Given: 2 Gy
Session Number: 20

## 2023-01-19 NOTE — Telephone Encounter (Signed)
Called patient regarding upcoming appointments, patient is notified. 

## 2023-01-22 ENCOUNTER — Ambulatory Visit: Payer: Medicare Other

## 2023-01-22 ENCOUNTER — Other Ambulatory Visit: Payer: Medicare Other

## 2023-01-22 ENCOUNTER — Inpatient Hospital Stay: Payer: Medicare Other

## 2023-01-22 ENCOUNTER — Ambulatory Visit
Admission: RE | Admit: 2023-01-22 | Discharge: 2023-01-22 | Disposition: A | Discharge status: Home / Self Care | Payer: Medicare Other | Source: Ambulatory Visit | Attending: Radiation Oncology | Admitting: Radiation Oncology

## 2023-01-22 ENCOUNTER — Other Ambulatory Visit: Payer: Self-pay

## 2023-01-22 VITALS — BP 146/80 | HR 78 | Temp 98.9°F | Resp 16 | Wt 146.4 lb

## 2023-01-22 DIAGNOSIS — R911 Solitary pulmonary nodule: Secondary | ICD-10-CM

## 2023-01-22 DIAGNOSIS — Z5111 Encounter for antineoplastic chemotherapy: Secondary | ICD-10-CM | POA: Diagnosis not present

## 2023-01-22 DIAGNOSIS — K208 Other esophagitis without bleeding: Secondary | ICD-10-CM | POA: Diagnosis not present

## 2023-01-22 DIAGNOSIS — C3411 Malignant neoplasm of upper lobe, right bronchus or lung: Secondary | ICD-10-CM | POA: Diagnosis not present

## 2023-01-22 DIAGNOSIS — C3491 Malignant neoplasm of unspecified part of right bronchus or lung: Secondary | ICD-10-CM

## 2023-01-22 DIAGNOSIS — Z510 Encounter for antineoplastic radiation therapy: Secondary | ICD-10-CM | POA: Diagnosis not present

## 2023-01-22 LAB — CBC WITH DIFFERENTIAL/PLATELET
Abs Immature Granulocytes: 0.06 10*3/uL (ref 0.00–0.07)
Basophils Absolute: 0 10*3/uL (ref 0.0–0.1)
Basophils Relative: 1 %
Eosinophils Absolute: 0.1 10*3/uL (ref 0.0–0.5)
Eosinophils Relative: 2 %
HCT: 42.7 % (ref 39.0–52.0)
Hemoglobin: 15 g/dL (ref 13.0–17.0)
Immature Granulocytes: 2 %
Lymphocytes Relative: 9 %
Lymphs Abs: 0.3 10*3/uL — ABNORMAL LOW (ref 0.7–4.0)
MCH: 29.5 pg (ref 26.0–34.0)
MCHC: 35.1 g/dL (ref 30.0–36.0)
MCV: 83.9 fL (ref 80.0–100.0)
Monocytes Absolute: 0.6 10*3/uL (ref 0.1–1.0)
Monocytes Relative: 16 %
Neutro Abs: 2.8 10*3/uL (ref 1.7–7.7)
Neutrophils Relative %: 70 %
Platelets: 196 10*3/uL (ref 150–400)
RBC: 5.09 MIL/uL (ref 4.22–5.81)
RDW: 12.6 % (ref 11.5–15.5)
WBC: 3.9 10*3/uL — ABNORMAL LOW (ref 4.0–10.5)
nRBC: 0 % (ref 0.0–0.2)

## 2023-01-22 LAB — RAD ONC ARIA SESSION SUMMARY
Course Elapsed Days: 28
Plan Fractions Treated to Date: 21
Plan Prescribed Dose Per Fraction: 2 Gy
Plan Total Fractions Prescribed: 30
Plan Total Prescribed Dose: 60 Gy
Reference Point Dosage Given to Date: 42 Gy
Reference Point Session Dosage Given: 2 Gy
Session Number: 21

## 2023-01-22 LAB — COMPREHENSIVE METABOLIC PANEL
ALT: 24 U/L (ref 0–44)
AST: 23 U/L (ref 15–41)
Albumin: 4 g/dL (ref 3.5–5.0)
Alkaline Phosphatase: 78 U/L (ref 38–126)
Anion gap: 8 (ref 5–15)
BUN: 25 mg/dL — ABNORMAL HIGH (ref 8–23)
CO2: 25 mmol/L (ref 22–32)
Calcium: 8.8 mg/dL — ABNORMAL LOW (ref 8.9–10.3)
Chloride: 106 mmol/L (ref 98–111)
Creatinine, Ser: 1.13 mg/dL (ref 0.61–1.24)
GFR, Estimated: >60 mL/min (ref >60)
Glucose, Bld: 89 mg/dL (ref 70–99)
Potassium: 3.9 mmol/L (ref 3.5–5.1)
Sodium: 139 mmol/L (ref 135–145)
Total Bilirubin: 0.7 mg/dL (ref 0.3–1.2)
Total Protein: 7.1 g/dL (ref 6.5–8.1)

## 2023-01-22 MED ORDER — SODIUM CHLORIDE 0.9 % IV SOLN
161.6000 mg | Freq: Once | INTRAVENOUS | Status: AC
  Administered 2023-01-22: 160 mg via INTRAVENOUS
  Filled 2023-01-22: qty 16

## 2023-01-22 MED ORDER — SODIUM CHLORIDE 0.9 % IV SOLN: Freq: Once | INTRAVENOUS | Status: AC

## 2023-01-22 MED ORDER — SODIUM CHLORIDE 0.9 % IV SOLN
10.0000 mg | Freq: Once | INTRAVENOUS | Status: AC
  Administered 2023-01-22: 10 mg via INTRAVENOUS
  Filled 2023-01-22: qty 10

## 2023-01-22 MED ORDER — HEPARIN SOD (PORK) LOCK FLUSH 100 UNIT/ML IV SOLN: 500.0000 [IU] | Freq: Once | INTRAVENOUS | Status: DC | PRN

## 2023-01-22 MED ORDER — PACLITAXEL PROTEIN-BOUND CHEMO INJECTION 100 MG
45.0000 mg/m2 | Freq: Once | INTRAVENOUS | Status: AC
  Administered 2023-01-22: 75 mg via INTRAVENOUS
  Filled 2023-01-22: qty 15

## 2023-01-22 MED ORDER — PALONOSETRON HCL INJECTION 0.25 MG/5ML
0.2500 mg | Freq: Once | INTRAVENOUS | Status: AC
  Administered 2023-01-22: 0.25 mg via INTRAVENOUS
  Filled 2023-01-22: qty 5

## 2023-01-22 MED ORDER — SODIUM CHLORIDE 0.9% FLUSH: 10.0000 mL | INTRAVENOUS | Status: DC | PRN

## 2023-01-22 NOTE — Patient Instructions (Signed)
[facility]  Discharge Instructions: Thank you for choosing [facility] to provide your oncology and hematology care.   If you have a lab appointment with the [facility], please go directly to the [facility] and check in at the registration area.   Wear comfortable clothing and clothing appropriate for easy access to any Portacath or PICC line.   We strive to give you quality time with your provider. You may need to reschedule your appointment if you arrive late (15 or more minutes).  Arriving late affects you and other patients whose appointments are after yours.  Also, if you miss three or more appointments without notifying the office, you may be dismissed from the clinic at the provider's discretion.      For prescription refill requests, have your pharmacy contact our office and allow 72 hours for refills to be completed.    Today you received the following chemotherapy and/or immunotherapy agents: Abraxane/Carboplatin      To help prevent nausea and vomiting after your treatment, we encourage you to take your nausea medication as directed.  BELOW ARE SYMPTOMS THAT SHOULD BE REPORTED IMMEDIATELY: *FEVER GREATER THAN 100.4 F (38 C) OR HIGHER *CHILLS OR SWEATING *NAUSEA AND VOMITING THAT IS NOT CONTROLLED WITH YOUR NAUSEA MEDICATION *UNUSUAL SHORTNESS OF BREATH *UNUSUAL BRUISING OR BLEEDING *URINARY PROBLEMS (pain or burning when urinating, or frequent urination) *BOWEL PROBLEMS (unusual diarrhea, constipation, pain near the anus) TENDERNESS IN MOUTH AND THROAT WITH OR WITHOUT PRESENCE OF ULCERS (sore throat, sores in mouth, or a toothache) UNUSUAL RASH, SWELLING OR PAIN  UNUSUAL VAGINAL DISCHARGE OR ITCHING   Items with * indicate a potential emergency and should be followed up as soon as possible or go to the Emergency Department if any problems should occur.  Please show the CHEMOTHERAPY ALERT CARD or IMMUNOTHERAPY ALERT  CARD at check-in to the Emergency Department and triage nurse.  Should you have questions after your visit or need to cancel or reschedule your appointment, please contact [facility]  Dept: [phone]  and follow the prompts.  Office hours are 8:00 a.m. to 4:30 p.m. Monday - Friday. Please note that voicemails left after 4:00 p.m. may not be returned until the following business day.  We are closed weekends and major holidays. You have access to a nurse at all times for urgent questions. Please call the main number to the clinic Dept: [phone] and follow the prompts.   For any non-urgent questions, you may also contact your provider using MyChart. We now offer e-Visits for anyone 84 and older to request care online for non-urgent symptoms. For details visit [email or web address].   Also download the MyChart app! Go to the app store, search "MyChart", open the app, select , and log in with your MyChart username and password.

## 2023-01-23 ENCOUNTER — Other Ambulatory Visit: Payer: Self-pay

## 2023-01-23 ENCOUNTER — Telehealth: Payer: Self-pay

## 2023-01-23 ENCOUNTER — Encounter: Payer: Self-pay | Admitting: Internal Medicine

## 2023-01-23 ENCOUNTER — Ambulatory Visit
Admission: RE | Admit: 2023-01-23 | Discharge: 2023-01-23 | Disposition: A | Discharge status: Home / Self Care | Payer: Medicare Other | Source: Ambulatory Visit | Attending: Radiation Oncology | Admitting: Radiation Oncology

## 2023-01-23 DIAGNOSIS — Z510 Encounter for antineoplastic radiation therapy: Secondary | ICD-10-CM | POA: Diagnosis not present

## 2023-01-23 DIAGNOSIS — Z5111 Encounter for antineoplastic chemotherapy: Secondary | ICD-10-CM | POA: Diagnosis not present

## 2023-01-23 DIAGNOSIS — C3411 Malignant neoplasm of upper lobe, right bronchus or lung: Secondary | ICD-10-CM | POA: Diagnosis not present

## 2023-01-23 DIAGNOSIS — K208 Other esophagitis without bleeding: Secondary | ICD-10-CM | POA: Diagnosis not present

## 2023-01-23 LAB — RAD ONC ARIA SESSION SUMMARY
Course Elapsed Days: 29
Plan Fractions Treated to Date: 22
Plan Prescribed Dose Per Fraction: 2 Gy
Plan Total Fractions Prescribed: 30
Plan Total Prescribed Dose: 60 Gy
Reference Point Dosage Given to Date: 44 Gy
Reference Point Session Dosage Given: 2 Gy
Session Number: 22

## 2023-01-23 NOTE — Telephone Encounter (Signed)
-----   Message from Willis Modena, RN sent at 01/22/2023  4:00 PM EDT ----- Regarding: Dr. Julien Nordmann 1st time Abraxane f/u tol well Dr. Julien Nordmann 1st time Abraxane tolerated tx well without incident. Pt call back due.

## 2023-01-23 NOTE — Telephone Encounter (Signed)
Alan Mendez states that he is eating, drinking, and urinating well. He is doing fine.  He knows to call the office at [phone] if he has any questions or concerns.

## 2023-01-24 ENCOUNTER — Ambulatory Visit
Admission: RE | Admit: 2023-01-24 | Discharge: 2023-01-24 | Disposition: A | Discharge status: Home / Self Care | Payer: Medicare Other | Source: Ambulatory Visit | Attending: Radiation Oncology | Admitting: Radiation Oncology

## 2023-01-24 ENCOUNTER — Other Ambulatory Visit: Payer: Self-pay

## 2023-01-24 DIAGNOSIS — Z5111 Encounter for antineoplastic chemotherapy: Secondary | ICD-10-CM | POA: Diagnosis not present

## 2023-01-24 DIAGNOSIS — C3411 Malignant neoplasm of upper lobe, right bronchus or lung: Secondary | ICD-10-CM | POA: Diagnosis not present

## 2023-01-24 DIAGNOSIS — Z510 Encounter for antineoplastic radiation therapy: Secondary | ICD-10-CM | POA: Diagnosis not present

## 2023-01-24 DIAGNOSIS — K208 Other esophagitis without bleeding: Secondary | ICD-10-CM | POA: Diagnosis not present

## 2023-01-24 LAB — RAD ONC ARIA SESSION SUMMARY
Course Elapsed Days: 30
Plan Fractions Treated to Date: 23
Plan Prescribed Dose Per Fraction: 2 Gy
Plan Total Fractions Prescribed: 30
Plan Total Prescribed Dose: 60 Gy
Reference Point Dosage Given to Date: 46 Gy
Reference Point Session Dosage Given: 2 Gy
Session Number: 23

## 2023-01-25 ENCOUNTER — Other Ambulatory Visit: Payer: Self-pay

## 2023-01-25 ENCOUNTER — Ambulatory Visit
Admission: RE | Admit: 2023-01-25 | Discharge: 2023-01-25 | Disposition: A | Discharge status: Home / Self Care | Payer: Medicare Other | Source: Ambulatory Visit | Attending: Radiation Oncology | Admitting: Radiation Oncology

## 2023-01-25 ENCOUNTER — Telehealth: Payer: Self-pay | Admitting: Medical Oncology

## 2023-01-25 DIAGNOSIS — K208 Other esophagitis without bleeding: Secondary | ICD-10-CM | POA: Diagnosis not present

## 2023-01-25 DIAGNOSIS — Z5111 Encounter for antineoplastic chemotherapy: Secondary | ICD-10-CM | POA: Diagnosis not present

## 2023-01-25 DIAGNOSIS — Z510 Encounter for antineoplastic radiation therapy: Secondary | ICD-10-CM | POA: Diagnosis not present

## 2023-01-25 DIAGNOSIS — C3411 Malignant neoplasm of upper lobe, right bronchus or lung: Secondary | ICD-10-CM | POA: Diagnosis not present

## 2023-01-25 LAB — RAD ONC ARIA SESSION SUMMARY
Course Elapsed Days: 31
Plan Fractions Treated to Date: 24
Plan Prescribed Dose Per Fraction: 2 Gy
Plan Total Fractions Prescribed: 30
Plan Total Prescribed Dose: 60 Gy
Reference Point Dosage Given to Date: 48 Gy
Reference Point Session Dosage Given: 2 Gy
Session Number: 24

## 2023-01-25 NOTE — Telephone Encounter (Signed)
Explained the difference between radiation treatment and a " boost " treatment and that he is scheduled per [facility] for the chemo on the same week of his last 2 radiations.Pt taking Carafate for dysphagia. I encouraged puddings , soups , jello ,etc.

## 2023-01-25 NOTE — Progress Notes (Signed)
[facility] OFFICE PROGRESS NOTE  Mendez, Alan Him, MD [place] [address]  DIAGNOSIS: Stage IIIb (T3, N2, M0) non-small cell lung cancer, squamous cell carcinoma presented with 2 separate right upper lobe lung nodule in addition to mediastinal lymphadenopathy diagnosed in January 2024   PDL1 expression: 1%  PRIOR THERAPY: None  CURRENT THERAPY: A course of concurrent chemoradiation with weekly carboplatin for AUC of 2 and paclitaxel 45 Mg/M2.  First dose expected January 02, 2023.  Status post 4 cycles. Taxol changed to abraxane starting from cycle #4 due to reaction to taxol   INTERVAL HISTORY: Alan Mendez 82 y.o. male returns to the clinic for a follow up visit. The patient is feeling well today without any concerning complaints except for some fatigue and mild esophagitis from radiation. However, he is still able to eat and has not lost a significant amount of weight. He has carafate if needed. His last day of radiation is scheduled for 02/07/23. Denies any fever, chills, night sweats, or weight loss. Denies any chest pain or hemoptysis. He denies significant cough unless sometimes after eating or if he has to cough up mucus. He denies significant dyspnea. Denies any nausea, vomiting, or diarrhea. He has some constipation frequently for which he eats prunes which helps. Denies any headache or visual changes. The patient is here today for evaluation prior to starting cycle # 5   MEDICAL HISTORY: Past Medical History:  Diagnosis Date   Acquired solitary kidney    left --  s/p  right nephroureterctomy 01/ 2018   Anxiety    no current problems   Depression    no current problems   Elevated PSA    Enlarged lymph node 11/2022   enlarged right paratracheal lymph node.   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, negative margins)   History of kidney stones    passed stones and also surgery to remove   History of unilateral nephrectomy    01/ 2018  right nephroureterectomy for renal pelvis mass (carcinoma in situ)   HLD (hyperlipidemia)    on crestor   Hyperplasia of prostate without lower urinary tract symptoms (LUTS)    Lung cancer ([facility]) 12/07/2022   Nephrolithiasis    bilateral non-obstructive   Pneumonia    x 1 - yrs ago   Pulmonary nodules 11/2022   2 right upper lobe pulmonary nodules   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HCC)    Renal cyst, acquired, right    right kidney removed   Wears glasses     ALLERGIES:  is allergic to paclitaxel and lamisil [terbinafine].  MEDICATIONS:  Current Outpatient Medications  Medication Sig Dispense Refill   cholecalciferol (VITAMIN D3) 25 MCG (1000 UNIT) tablet Take 1,000 Units by mouth daily.     Multiple Vitamins-Minerals (MULTIVITAMIN WITH MINERALS) tablet Take 1 tablet by mouth daily. Centrum     prochlorperazine (COMPAZINE) 10 MG tablet Take 10 mg by mouth every 6 (six) hours as needed for nausea or vomiting. (Patient not taking: Reported on 01/15/2023)     rosuvastatin (CRESTOR) 10 MG tablet TAKE 1 TABLET EACH DAY. (Patient taking differently: Take 20 mg by mouth daily.) 30 tablet 0   tamsulosin (FLOMAX) 0.4 MG CAPS capsule Take  1 capsule (0.4 mg total) by mouth daily. (Patient taking differently: Take 0.4 mg by mouth daily after breakfast.) 30 capsule 5   No current facility-administered medications for this visit.    SURGICAL HISTORY:  Past Surgical History:  Procedure Laterality Date   APPENDECTOMY  child   BRONCHIAL BIOPSY  12/07/2022   Procedure: BRONCHIAL BIOPSIES;  Surgeon: Garner Nash, DO;  Location: [facility];  Service: Pulmonary;;   BRONCHIAL BRUSHINGS  12/07/2022   Procedure: BRONCHIAL BRUSHINGS;  Surgeon: Garner Nash, DO;  Location:  [facility];  Service: Pulmonary;;   BRONCHIAL NEEDLE ASPIRATION BIOPSY  12/07/2022   Procedure: BRONCHIAL NEEDLE ASPIRATION BIOPSIES;  Surgeon: Garner Nash, DO;  Location: [facility];  Service: Pulmonary;;   CATARACT EXTRACTION W/ INTRAOCULAR LENS  IMPLANT, BILATERAL  2017   COLONOSCOPY  08/15/2012   Tubular Adenoma, No high grade dysplasia or malignacy.   CYSTOSCOPY W/ RETROGRADES Right 09/25/2016   Procedure: CYSTOSCOPY, URETHRAL DILITATION  WITH RETROGRADE PYELOGRAM, BRUSH BIOPSIES OF RIGHT RENAL MASS;  Surgeon: Carolan Clines, MD;  Location: [facility];  Service: Urology;  Laterality: Right;   CYSTOSCOPY W/ RETROGRADES Left 03/16/2017   Procedure: CYSTOSCOPY WITH RETROGRADE PYELOGRAM;  Surgeon: Ardis Hughs, MD;  Location: [facility];  Service: Urology;  Laterality: Left;   CYSTOSCOPY W/ URETERAL STENT REMOVAL Right 09/25/2016   Procedure: CYSTOSCOPY WITH STENT REMOVAL;  Surgeon: Carolan Clines, MD;  Location: [facility];  Service: Urology;  Laterality: Right;   CYSTOSCOPY WITH RETROGRADE PYELOGRAM, URETEROSCOPY AND STENT PLACEMENT Right 08/21/2016   Procedure: CYSTOSCOPY WITH RIGHT RETROGRADE PYELOGRAM, RIGHT FLEXIBLE AND RIGID URETEROSCOPY, INSERTION DOUBLE J STENT RIGHT;  Surgeon: Carolan Clines, MD;  Location: [facility];  Service: Urology;  Laterality: Right;   EXTRACORPOREAL SHOCK WAVE LITHOTRIPSY  2009   KNEE SURGERY Right 1980's   ROBOT ASSITED LAPAROSCOPIC NEPHROURETERECTOMY Right 11/15/2016   Procedure: RIGHT XI ROBOT ASSITED LAPAROSCOPIC NEPHROURETERECTOMY;  Surgeon: Ardis Hughs, MD;  Location: [facility];  Service: Urology;  Laterality: Right;   TRANSURETHRAL RESECTION OF BLADDER TUMOR WITH MITOMYCIN-C Left 03/16/2017   Procedure: CYSTOSCOPY BIOPSIES OF BLADDER TUMOR WITH FULGURATION;  Surgeon: Ardis Hughs, MD;  Location[facility];  Service: Urology;  Laterality: Left;    URETEROSCOPY Right 09/25/2016   Procedure: RIGHT URETEROSCOPY;  Surgeon: Carolan Clines, MD;  Location: [facility];  Service: Urology;  Laterality: Right;   VIDEO BRONCHOSCOPY WITH ENDOBRONCHIAL ULTRASOUND Right 12/07/2022   Procedure: VIDEO BRONCHOSCOPY WITH ENDOBRONCHIAL ULTRASOUND;  Surgeon: Garner Nash, DO;  Location: [facility];  Service: Pulmonary;  Laterality: Right;    REVIEW OF SYSTEMS:   Review of Systems  Constitutional: Positive for fatigue. Negative for appetite change, chills, fever and unexpected weight change.  HENT: Positive for mild esophagitis. Negative for mouth sores, nosebleeds, sore throat and trouble swallowing.   Eyes: Negative for eye problems and icterus.  Respiratory: Negative for cough, hemoptysis, shortness of breath and wheezing.   Cardiovascular: Negative for chest pain and leg swelling.  Gastrointestinal: Positive for occasional constipation. Negative for abdominal pain, diarrhea, nausea and vomiting.  Genitourinary: Negative for bladder incontinence, difficulty urinating, dysuria, frequency and hematuria.   Musculoskeletal: Right ankle boot. Negative for back pain, gait problem, neck pain and neck stiffness.  Skin: Negative for itching and rash.  Neurological: Negative for dizziness, extremity weakness, gait problem, headaches, light-headedness and seizures.  Hematological: Negative for adenopathy. Does not bruise/bleed easily.  Psychiatric/Behavioral: Negative for  confusion, depression and sleep disturbance. The patient is not nervous/anxious.     PHYSICAL EXAMINATION:  Blood pressure 122/77, pulse 87, temperature 98.4 F (36.9 C), temperature source Oral, resp. rate 18, SpO2 98 %.  ECOG PERFORMANCE STATUS: 1  Physical Exam  Constitutional: Oriented to person, place, and time and well-developed, well-nourished, and in no distress.  HENT:  Head: Normocephalic and atraumatic.  Mouth/Throat: Oropharynx is clear and moist. No  oropharyngeal exudate.  Eyes: Conjunctivae are normal. Right eye exhibits no discharge. Left eye exhibits no discharge. No scleral icterus.  Neck: Normal range of motion. Neck supple.  Cardiovascular: Normal rate, regular rhythm, normal heart sounds and intact distal pulses.   Pulmonary/Chest: Effort normal and breath sounds normal. No respiratory distress. No wheezes. No rales.  Abdominal: Soft. Bowel sounds are normal. Exhibits no distension and no mass. There is no tenderness.  Musculoskeletal: Normal range of motion. Exhibits no edema.  Lymphadenopathy:    No cervical adenopathy.  Neurological: Alert and oriented to person, place, and time. Exhibits normal muscle tone. Examined in the wheelchair. Has boot on right foot.  Skin: Skin is warm and dry. No rash noted. Not diaphoretic. No erythema. No pallor.  Psychiatric: Mood, memory and judgment normal.  Vitals reviewed.  LABORATORY DATA: Lab Results  Component Value Date   WBC 5.3 01/29/2023   HGB 14.8 01/29/2023   HCT 41.6 01/29/2023   MCV 85.1 01/29/2023   PLT 183 01/29/2023      Chemistry      Component Value Date/Time   NA 140 01/29/2023 0830   K 3.8 01/29/2023 0830   CL 106 01/29/2023 0830   CO2 27 01/29/2023 0830   BUN 19 01/29/2023 0830   CREATININE 1.09 01/29/2023 0830   CREATININE 0.85 12/10/2014 1550      Component Value Date/Time   CALCIUM 9.3 01/29/2023 0830   ALKPHOS 77 01/29/2023 0830   AST 23 01/29/2023 0830   ALT 26 01/29/2023 0830   BILITOT 0.7 01/29/2023 0830       RADIOGRAPHIC STUDIES:  CT ANKLE RIGHT WO CONTRAST  Result Date: 01/05/2023 CLINICAL DATA:  Right ankle fracture.  Recent fall EXAM: CT OF THE RIGHT ANKLE WITHOUT CONTRAST TECHNIQUE: Multidetector CT imaging of the right ankle was performed according to the standard protocol. Multiplanar CT image reconstructions were also generated. RADIATION DOSE REDUCTION: This exam was performed according to the departmental dose-optimization program  which includes automated exposure control, adjustment of the mA and/or kV according to patient size and/or use of iterative reconstruction technique. COMPARISON:  X-ray 12/24/2022 FINDINGS: Bones/Joint/Cartilage Acute obliquely oriented fracture of the distal fibular metaphysis with intra-articular extension to the distal tibiofibular joint and ankle mortise. Mild posterolateral displacement of 3 mm. Subtle fragmentation along the anterolateral margin of the tibial plafond suspicious for tiny avulsion fragments (series 3, image 48). Subtle nondisplaced posterior malleolar fracture (series 9, image 32). No medial malleolar fracture. Slight asymmetric widening of the lateral clear space. No tibiotalar dislocation. Moderate tibiotalar joint effusion/hemarthrosis. Small os trigonum. Bones of the hindfoot and midfoot are intact. No fracture or malalignment. Small plantar calcaneal spur. Suspected anterior Ligaments Suboptimally assessed by CT. Tibiofibular ligament avulsion injury. Soft tissue thickening in the expected locations of the anterior and posterior talofibular ligaments, presumably posttraumatic. Muscles and Tendons No acute musculotendinous abnormality by CT. Soft tissues Diffuse soft tissue swelling and edema of the ankle and dorsal foot. No soft tissue air to suggest open fracture. IMPRESSION: 1. Acute mildly displaced fracture of the distal  fibular metaphysis. 2. Subtle nondisplaced posterior malleolar fracture. 3. Subtle fragmentation along the anterolateral margin of the tibial plafond suspicious for tiny avulsion fragments. 4. Slight asymmetric widening of the lateral clear space. 5. Moderate tibiotalar joint effusion/hemarthrosis. 6. Diffuse soft tissue swelling and edema of the ankle and dorsal foot. Electronically Signed   By: Alan Mendez D.O.   On: 01/05/2023 14:40     ASSESSMENT/PLAN:  This is a very pleasant 82 year old  Caucasian male with Stage IIIb (T3, N2, M0) non-small cell lung  cancer, squamous cell carcinoma presented with 2 separate right upper lobe lung nodule in addition to mediastinal lymphadenopathy diagnosed in January 2024. His PDL1 expression is 1%.   The patient is currently undergoing a course of concurrent chemoradiation with weekly carboplatin for AUC of 2 and paclitaxel 45 Mg/M2.  Status post 4 cycles. Starting from cycle #4, the taxol was switched to abraxane due to an infusion reaction to taxol.    Labs were reviewed. Recommend he proceed with cycle #5 today as scheduled.   Next week will be his last infusion as long as his labs permit. I will arrange for a restaging CT scan of the chest approximately 3 weeks after his last dose of radiation.   We will see him back a few days after his CT scan to review the results and discuss the next steps in his care.   He will continue to use carafate for his esophagitis.   We reviewed constipation education. Encouraged him to try a stool softener to try to prevent constipation. For times he is actively constipated, he was instructed to use laxatives.   The patient was advised to call immediately if he has any concerning symptoms in the interval. The patient voices understanding of current disease status and treatment options and is in agreement with the current care plan. All questions were answered. The patient knows to call the clinic with any problems, questions or concerns. We can certainly see the patient much sooner if necessary     Orders Placed This Encounter  Procedures   CT Chest W Contrast    Standing Status:   Future    Standing Expiration Date:   01/29/2024    Order Specific Question:   If indicated for the ordered procedure, I authorize the administration of contrast media per Radiology protocol    Answer:   Yes    Order Specific Question:   Does the patient have a contrast media/X-ray dye allergy?    Answer:   No    Order Specific Question:   Preferred imaging location?    Answer:   [facility]    The total time spent in the appointment was 20-29 minutes.   Cadince Hilscher L Naysa Puskas, PA-C 01/29/23

## 2023-01-26 ENCOUNTER — Other Ambulatory Visit: Payer: Self-pay

## 2023-01-26 ENCOUNTER — Ambulatory Visit
Admission: RE | Admit: 2023-01-26 | Discharge: 2023-01-26 | Disposition: A | Discharge status: Home / Self Care | Payer: Medicare Other | Source: Ambulatory Visit | Attending: Radiation Oncology | Admitting: Radiation Oncology

## 2023-01-26 DIAGNOSIS — Z5111 Encounter for antineoplastic chemotherapy: Secondary | ICD-10-CM | POA: Diagnosis not present

## 2023-01-26 DIAGNOSIS — C3411 Malignant neoplasm of upper lobe, right bronchus or lung: Secondary | ICD-10-CM | POA: Diagnosis not present

## 2023-01-26 DIAGNOSIS — S8261XA Displaced fracture of lateral malleolus of right fibula, initial encounter for closed fracture: Secondary | ICD-10-CM | POA: Diagnosis not present

## 2023-01-26 DIAGNOSIS — Z510 Encounter for antineoplastic radiation therapy: Secondary | ICD-10-CM | POA: Diagnosis not present

## 2023-01-26 DIAGNOSIS — K208 Other esophagitis without bleeding: Secondary | ICD-10-CM | POA: Diagnosis not present

## 2023-01-26 LAB — RAD ONC ARIA SESSION SUMMARY
Course Elapsed Days: 32
Plan Fractions Treated to Date: 25
Plan Prescribed Dose Per Fraction: 2 Gy
Plan Total Fractions Prescribed: 30
Plan Total Prescribed Dose: 60 Gy
Reference Point Dosage Given to Date: 50 Gy
Reference Point Session Dosage Given: 2 Gy
Session Number: 25

## 2023-01-26 MED FILL — Dexamethasone Sodium Phosphate Inj 100 MG/10ML: INTRAMUSCULAR | Qty: 1 | Status: AC

## 2023-01-29 ENCOUNTER — Other Ambulatory Visit: Payer: Self-pay

## 2023-01-29 ENCOUNTER — Inpatient Hospital Stay: Payer: Medicare Other

## 2023-01-29 ENCOUNTER — Inpatient Hospital Stay (HOSPITAL_BASED_OUTPATIENT_CLINIC_OR_DEPARTMENT_OTHER): Payer: Medicare Other | Admitting: Physician Assistant

## 2023-01-29 ENCOUNTER — Ambulatory Visit: Payer: Medicare Other

## 2023-01-29 ENCOUNTER — Encounter: Payer: Self-pay | Admitting: *Deleted

## 2023-01-29 ENCOUNTER — Other Ambulatory Visit: Payer: Medicare Other

## 2023-01-29 ENCOUNTER — Ambulatory Visit
Admission: RE | Admit: 2023-01-29 | Discharge: 2023-01-29 | Disposition: A | Discharge status: Home / Self Care | Payer: Medicare Other | Source: Ambulatory Visit | Attending: Radiation Oncology | Admitting: Radiation Oncology

## 2023-01-29 VITALS — BP 146/80 | HR 91 | Temp 98.0°F | Resp 18

## 2023-01-29 VITALS — BP 122/77 | HR 87 | Temp 98.4°F | Resp 18

## 2023-01-29 DIAGNOSIS — C3491 Malignant neoplasm of unspecified part of right bronchus or lung: Secondary | ICD-10-CM

## 2023-01-29 DIAGNOSIS — C3411 Malignant neoplasm of upper lobe, right bronchus or lung: Secondary | ICD-10-CM | POA: Diagnosis not present

## 2023-01-29 DIAGNOSIS — Z5111 Encounter for antineoplastic chemotherapy: Secondary | ICD-10-CM

## 2023-01-29 DIAGNOSIS — Z510 Encounter for antineoplastic radiation therapy: Secondary | ICD-10-CM | POA: Diagnosis not present

## 2023-01-29 DIAGNOSIS — K208 Other esophagitis without bleeding: Secondary | ICD-10-CM | POA: Diagnosis not present

## 2023-01-29 LAB — RAD ONC ARIA SESSION SUMMARY
Course Elapsed Days: 35
Plan Fractions Treated to Date: 26
Plan Prescribed Dose Per Fraction: 2 Gy
Plan Total Fractions Prescribed: 30
Plan Total Prescribed Dose: 60 Gy
Reference Point Dosage Given to Date: 52 Gy
Reference Point Session Dosage Given: 2 Gy
Session Number: 26

## 2023-01-29 LAB — CBC WITH DIFFERENTIAL (CANCER CENTER ONLY)
Abs Immature Granulocytes: 0.07 10*3/uL (ref 0.00–0.07)
Basophils Absolute: 0 10*3/uL (ref 0.0–0.1)
Basophils Relative: 1 %
Eosinophils Absolute: 0.1 10*3/uL (ref 0.0–0.5)
Eosinophils Relative: 1 %
HCT: 41.6 % (ref 39.0–52.0)
Hemoglobin: 14.8 g/dL (ref 13.0–17.0)
Immature Granulocytes: 1 %
Lymphocytes Relative: 5 %
Lymphs Abs: 0.3 10*3/uL — ABNORMAL LOW (ref 0.7–4.0)
MCH: 30.3 pg (ref 26.0–34.0)
MCHC: 35.6 g/dL (ref 30.0–36.0)
MCV: 85.1 fL (ref 80.0–100.0)
Monocytes Absolute: 0.4 10*3/uL (ref 0.1–1.0)
Monocytes Relative: 8 %
Neutro Abs: 4.4 10*3/uL (ref 1.7–7.7)
Neutrophils Relative %: 84 %
Platelet Count: 183 10*3/uL (ref 150–400)
RBC: 4.89 MIL/uL (ref 4.22–5.81)
RDW: 12.9 % (ref 11.5–15.5)
WBC Count: 5.3 10*3/uL (ref 4.0–10.5)
nRBC: 0 % (ref 0.0–0.2)

## 2023-01-29 LAB — CMP (CANCER CENTER ONLY)
ALT: 26 U/L (ref 0–44)
AST: 23 U/L (ref 15–41)
Albumin: 3.9 g/dL (ref 3.5–5.0)
Alkaline Phosphatase: 77 U/L (ref 38–126)
Anion gap: 7 (ref 5–15)
BUN: 19 mg/dL (ref 8–23)
CO2: 27 mmol/L (ref 22–32)
Calcium: 9.3 mg/dL (ref 8.9–10.3)
Chloride: 106 mmol/L (ref 98–111)
Creatinine: 1.09 mg/dL (ref 0.61–1.24)
GFR, Estimated: >60 mL/min (ref >60)
Glucose, Bld: 100 mg/dL — ABNORMAL HIGH (ref 70–99)
Potassium: 3.8 mmol/L (ref 3.5–5.1)
Sodium: 140 mmol/L (ref 135–145)
Total Bilirubin: 0.7 mg/dL (ref 0.3–1.2)
Total Protein: 7 g/dL (ref 6.5–8.1)

## 2023-01-29 MED ORDER — SODIUM CHLORIDE 0.9% FLUSH: 10.0000 mL | INTRAVENOUS | Status: DC | PRN

## 2023-01-29 MED ORDER — SODIUM CHLORIDE 0.9 % IV SOLN
161.6000 mg | Freq: Once | INTRAVENOUS | Status: AC
  Administered 2023-01-29: 160 mg via INTRAVENOUS
  Filled 2023-01-29: qty 16

## 2023-01-29 MED ORDER — PACLITAXEL PROTEIN-BOUND CHEMO INJECTION 100 MG
45.0000 mg/m2 | Freq: Once | INTRAVENOUS | Status: AC
  Administered 2023-01-29: 75 mg via INTRAVENOUS
  Filled 2023-01-29: qty 15

## 2023-01-29 MED ORDER — SODIUM CHLORIDE 0.9 % IV SOLN
10.0000 mg | Freq: Once | INTRAVENOUS | Status: AC
  Administered 2023-01-29: 10 mg via INTRAVENOUS
  Filled 2023-01-29: qty 10

## 2023-01-29 MED ORDER — SODIUM CHLORIDE 0.9 % IV SOLN: Freq: Once | INTRAVENOUS | Status: AC

## 2023-01-29 MED ORDER — PALONOSETRON HCL INJECTION 0.25 MG/5ML
0.2500 mg | Freq: Once | INTRAVENOUS | Status: AC
  Administered 2023-01-29: 0.25 mg via INTRAVENOUS
  Filled 2023-01-29: qty 5

## 2023-01-29 MED ORDER — HEPARIN SOD (PORK) LOCK FLUSH 100 UNIT/ML IV SOLN: 250.0000 [IU] | Freq: Once | INTRAVENOUS | Status: DC | PRN

## 2023-01-29 MED ORDER — SODIUM CHLORIDE 0.9% FLUSH: 3.0000 mL | INTRAVENOUS | Status: DC | PRN

## 2023-01-29 MED ORDER — HEPARIN SOD (PORK) LOCK FLUSH 100 UNIT/ML IV SOLN: 500.0000 [IU] | Freq: Once | INTRAVENOUS | Status: DC | PRN

## 2023-01-29 MED ORDER — ALTEPLASE 2 MG IJ SOLR: 2.0000 mg | Freq: Once | INTRAMUSCULAR | Status: DC | PRN

## 2023-01-29 NOTE — Research (Signed)
Trial: PACIFIC-9 [id number]: A Phase III, double-blind, placebo-controlled, Randomised, Multicentre, International Study of Durvalumab Plus Oleclumab and Durvalumab Plus Monalizumab in Patients With Locally Advanced (Stage III), Unresectable Non-small Cell Lung Cancer (NSCLC) Who Have Not Progressed Following Definitive, Platinum-Based Concurrent Chemoradiation Therapy   Part 1 Screening Consent:   Patient Alan Mendez was identified by Dr. Julien Nordmann as a potential candidate for the above listed study.  This Clinical Research Nurse met with Alan Mendez, Alan Mendez, on 01/29/23 in a manner and location that ensures patient privacy to discuss participation in the above listed research study.  Patient is Unaccompanied.  A copy of the informed consent document and separate HIPAA Authorization was provided to the patient.  Patient reads, speaks, and understands [place].    Patient was provided with the business card of this Nurse and encouraged to contact the research team with any questions.  Patient was provided the option of taking informed consent documents home to review and was encouraged to review at their convenience with their support network, including other care providers. Patient is comfortable with making a decision regarding study participation today.  As outlined in the informed consent form, this Nurse and Alan Mendez discussed the purpose of the research study, the investigational nature of the study, study procedures and requirements for study participation, potential risks and benefits of study participation, as well as alternatives to participation. Part 1 of this study is not blinded. The patient understands participation is voluntary and they may withdraw from study participation at any time.  Part 1 of this study does not involve randomization.   Part 1 of this study does not involve an investigational drug or device. Part 1 of this study does not involve a placebo.  Patient  understands enrollment is pending full eligibility review.   Confidentiality and how the patient's information will be used as part of study participation were discussed.  Patient was informed there is reimbursement provided for their time and effort spent on trial participation.    All questions were answered to patient's satisfaction.  The informed consent and separate HIPAA Authorization was reviewed page by page.  The patient's mental and emotional status is appropriate to provide informed consent, and the patient verbalizes an understanding of study participation.  Patient has agreed to participate in the above listed research study and has voluntarily signed the Part 1 informed consent master version number 3.0 date 29 November 2021, [place] Local Version 1.0 date 20 Nov 2022, IRB approved 18 Dec 2022 and separate HIPAA Authorization, version 5, revised 10/29/19, IRB approved 08/23/2021  on 01/29/23 at 10:30 AM.  The patient was provided with a copy of the signed informed consent form and separate HIPAA Authorization for their reference.  No study specific procedures were obtained prior to the signing of the informed consent document.  Approximately 35 minutes were spent with the patient reviewing the informed consent documents.  Patient was not requested to complete a Release of Information form.   Thanked patient for his participation. Informed patient that research nurse will request his lung cancer tissue from Pathology department to send to the study for testing. It may take 2 weeks to get confirmation whether there is adequate tissue to proceed to part 2 of the main study.  Research nurse will contact patient with this information. Patient was given my card to contact research nurse if any questions prior to next contact.   This Nurse has reviewed this patient's inclusion and exclusion criteria and confirmed Alan Mendez  Alan Mendez is eligible for Part 1 study participation.  Patient will continue with  enrollment to Part 1 Screening. Eligibility confirmed by treating investigator, who also agrees that patient should proceed with enrollment to Part 1 Screening.   Foye Spurling, BSN, RN, [place] Nurse II [phone] 01/29/2023 10:58 AM

## 2023-01-29 NOTE — Progress Notes (Signed)
Patient seen by PA today  Vitals are within treatment parameters.  Labs reviewed: and are within treatment parameters.  Per physician team, patient is ready for treatment and there are NO modifications to the treatment plan.  

## 2023-01-29 NOTE — Patient Instructions (Signed)
[facility]  Discharge Instructions: Thank you for choosing [facility] to provide your oncology and hematology care.   If you have a lab appointment with the Cancer Center, please go directly to the Cancer Center and check in at the registration area.   Wear comfortable clothing and clothing appropriate for easy access to any Portacath or PICC line.   We strive to give you quality time with your provider. You may need to reschedule your appointment if you arrive late (15 or more minutes).  Arriving late affects you and other patients whose appointments are after yours.  Also, if you miss three or more appointments without notifying the office, you may be dismissed from the clinic at the provider's discretion.      For prescription refill requests, have your pharmacy contact our office and allow 72 hours for refills to be completed.    Today you received the following chemotherapy and/or immunotherapy agents: Abraxane/Carboplatin      To help prevent nausea and vomiting after your treatment, we encourage you to take your nausea medication as directed.  BELOW ARE SYMPTOMS THAT SHOULD BE REPORTED IMMEDIATELY: *FEVER GREATER THAN 100.4 F (38 C) OR HIGHER *CHILLS OR SWEATING *NAUSEA AND VOMITING THAT IS NOT CONTROLLED WITH YOUR NAUSEA MEDICATION *UNUSUAL SHORTNESS OF BREATH *UNUSUAL BRUISING OR BLEEDING *URINARY PROBLEMS (pain or burning when urinating, or frequent urination) *BOWEL PROBLEMS (unusual diarrhea, constipation, pain near the anus) TENDERNESS IN MOUTH AND THROAT WITH OR WITHOUT PRESENCE OF ULCERS (sore throat, sores in mouth, or a toothache) UNUSUAL RASH, SWELLING OR PAIN  UNUSUAL VAGINAL DISCHARGE OR ITCHING   Items with * indicate a potential emergency and should be followed up as soon as possible or go to the Emergency Department if any problems should occur.  Please show the CHEMOTHERAPY ALERT CARD or IMMUNOTHERAPY ALERT  CARD at check-in to the Emergency Department and triage nurse.  Should you have questions after your visit or need to cancel or reschedule your appointment, please contact [facility]  Dept: [phone]  and follow the prompts.  Office hours are 8:00 a.m. to 4:30 p.m. Monday - Friday. Please note that voicemails left after 4:00 p.m. may not be returned until the following business day.  We are closed weekends and major holidays. You have access to a nurse at all times for urgent questions. Please call the main number to the clinic Dept: [phone] and follow the prompts.   For any non-urgent questions, you may also contact your provider using MyChart. We now offer e-Visits for anyone 18 and older to request care online for non-urgent symptoms. For details visit [email or web address].   Also download the MyChart app! Go to the app store, search "MyChart", open the app, select [facility], and log in with your MyChart username and password.   

## 2023-01-30 ENCOUNTER — Ambulatory Visit
Admission: RE | Admit: 2023-01-30 | Discharge: 2023-01-30 | Disposition: A | Discharge status: Home / Self Care | Payer: Medicare Other | Source: Ambulatory Visit | Attending: Radiation Oncology | Admitting: Radiation Oncology

## 2023-01-30 ENCOUNTER — Other Ambulatory Visit: Payer: Self-pay

## 2023-01-30 DIAGNOSIS — Z510 Encounter for antineoplastic radiation therapy: Secondary | ICD-10-CM | POA: Diagnosis not present

## 2023-01-30 DIAGNOSIS — Z5111 Encounter for antineoplastic chemotherapy: Secondary | ICD-10-CM | POA: Diagnosis not present

## 2023-01-30 DIAGNOSIS — C3411 Malignant neoplasm of upper lobe, right bronchus or lung: Secondary | ICD-10-CM | POA: Diagnosis not present

## 2023-01-30 DIAGNOSIS — K208 Other esophagitis without bleeding: Secondary | ICD-10-CM | POA: Diagnosis not present

## 2023-01-30 LAB — RAD ONC ARIA SESSION SUMMARY
Course Elapsed Days: 36
Plan Fractions Treated to Date: 27
Plan Prescribed Dose Per Fraction: 2 Gy
Plan Total Fractions Prescribed: 30
Plan Total Prescribed Dose: 60 Gy
Reference Point Dosage Given to Date: 54 Gy
Reference Point Session Dosage Given: 2 Gy
Session Number: 27

## 2023-01-31 ENCOUNTER — Other Ambulatory Visit: Payer: Self-pay

## 2023-01-31 ENCOUNTER — Ambulatory Visit
Admission: RE | Admit: 2023-01-31 | Discharge: 2023-01-31 | Disposition: A | Discharge status: Home / Self Care | Payer: Medicare Other | Source: Ambulatory Visit | Attending: Radiation Oncology | Admitting: Radiation Oncology

## 2023-01-31 DIAGNOSIS — Z5111 Encounter for antineoplastic chemotherapy: Secondary | ICD-10-CM | POA: Diagnosis not present

## 2023-01-31 DIAGNOSIS — Z510 Encounter for antineoplastic radiation therapy: Secondary | ICD-10-CM | POA: Diagnosis not present

## 2023-01-31 DIAGNOSIS — K208 Other esophagitis without bleeding: Secondary | ICD-10-CM | POA: Diagnosis not present

## 2023-01-31 DIAGNOSIS — C3411 Malignant neoplasm of upper lobe, right bronchus or lung: Secondary | ICD-10-CM | POA: Diagnosis not present

## 2023-01-31 LAB — RAD ONC ARIA SESSION SUMMARY
Course Elapsed Days: 37
Plan Fractions Treated to Date: 28
Plan Prescribed Dose Per Fraction: 2 Gy
Plan Total Fractions Prescribed: 30
Plan Total Prescribed Dose: 60 Gy
Reference Point Dosage Given to Date: 56 Gy
Reference Point Session Dosage Given: 2 Gy
Session Number: 28

## 2023-02-01 ENCOUNTER — Ambulatory Visit
Admission: RE | Admit: 2023-02-01 | Discharge: 2023-02-01 | Disposition: A | Discharge status: Home / Self Care | Payer: Medicare Other | Source: Ambulatory Visit | Attending: Radiation Oncology | Admitting: Radiation Oncology

## 2023-02-01 ENCOUNTER — Other Ambulatory Visit: Payer: Self-pay

## 2023-02-01 DIAGNOSIS — Z510 Encounter for antineoplastic radiation therapy: Secondary | ICD-10-CM | POA: Diagnosis not present

## 2023-02-01 DIAGNOSIS — C3411 Malignant neoplasm of upper lobe, right bronchus or lung: Secondary | ICD-10-CM | POA: Diagnosis not present

## 2023-02-01 DIAGNOSIS — Z5111 Encounter for antineoplastic chemotherapy: Secondary | ICD-10-CM | POA: Diagnosis not present

## 2023-02-01 DIAGNOSIS — K208 Other esophagitis without bleeding: Secondary | ICD-10-CM | POA: Diagnosis not present

## 2023-02-01 LAB — RAD ONC ARIA SESSION SUMMARY
Course Elapsed Days: 38
Plan Fractions Treated to Date: 29
Plan Prescribed Dose Per Fraction: 2 Gy
Plan Total Fractions Prescribed: 30
Plan Total Prescribed Dose: 60 Gy
Reference Point Dosage Given to Date: 58 Gy
Reference Point Session Dosage Given: 2 Gy
Session Number: 29

## 2023-02-02 ENCOUNTER — Other Ambulatory Visit: Payer: Self-pay

## 2023-02-02 ENCOUNTER — Ambulatory Visit
Admission: RE | Admit: 2023-02-02 | Discharge: 2023-02-02 | Disposition: A | Discharge status: Home / Self Care | Payer: Medicare Other | Source: Ambulatory Visit | Attending: Radiation Oncology | Admitting: Radiation Oncology

## 2023-02-02 DIAGNOSIS — K208 Other esophagitis without bleeding: Secondary | ICD-10-CM | POA: Diagnosis not present

## 2023-02-02 DIAGNOSIS — Z510 Encounter for antineoplastic radiation therapy: Secondary | ICD-10-CM | POA: Diagnosis not present

## 2023-02-02 DIAGNOSIS — C3411 Malignant neoplasm of upper lobe, right bronchus or lung: Secondary | ICD-10-CM | POA: Diagnosis not present

## 2023-02-02 DIAGNOSIS — Z5111 Encounter for antineoplastic chemotherapy: Secondary | ICD-10-CM | POA: Diagnosis not present

## 2023-02-02 LAB — RAD ONC ARIA SESSION SUMMARY
Course Elapsed Days: 39
Plan Fractions Treated to Date: 30
Plan Prescribed Dose Per Fraction: 2 Gy
Plan Total Fractions Prescribed: 30
Plan Total Prescribed Dose: 60 Gy
Reference Point Dosage Given to Date: 60 Gy
Reference Point Session Dosage Given: 2 Gy
Session Number: 30

## 2023-02-05 ENCOUNTER — Ambulatory Visit
Admission: RE | Admit: 2023-02-05 | Discharge: 2023-02-05 | Disposition: A | Discharge status: Home / Self Care | Payer: Medicare Other | Source: Ambulatory Visit | Attending: Radiation Oncology | Admitting: Radiation Oncology

## 2023-02-05 ENCOUNTER — Inpatient Hospital Stay: Payer: Medicare Other

## 2023-02-05 ENCOUNTER — Other Ambulatory Visit: Payer: Self-pay

## 2023-02-05 ENCOUNTER — Other Ambulatory Visit: Payer: Self-pay | Admitting: Internal Medicine

## 2023-02-05 VITALS — BP 125/77 | HR 75 | Temp 98.3°F | Resp 16 | Wt 147.5 lb

## 2023-02-05 DIAGNOSIS — Z5111 Encounter for antineoplastic chemotherapy: Secondary | ICD-10-CM | POA: Diagnosis not present

## 2023-02-05 DIAGNOSIS — C3491 Malignant neoplasm of unspecified part of right bronchus or lung: Secondary | ICD-10-CM

## 2023-02-05 DIAGNOSIS — C3411 Malignant neoplasm of upper lobe, right bronchus or lung: Secondary | ICD-10-CM | POA: Diagnosis not present

## 2023-02-05 DIAGNOSIS — Z510 Encounter for antineoplastic radiation therapy: Secondary | ICD-10-CM | POA: Diagnosis not present

## 2023-02-05 DIAGNOSIS — K208 Other esophagitis without bleeding: Secondary | ICD-10-CM | POA: Diagnosis not present

## 2023-02-05 LAB — CBC WITH DIFFERENTIAL (CANCER CENTER ONLY)
Abs Immature Granulocytes: 0.04 10*3/uL (ref 0.00–0.07)
Basophils Absolute: 0 10*3/uL (ref 0.0–0.1)
Basophils Relative: 1 %
Eosinophils Absolute: 0.1 10*3/uL (ref 0.0–0.5)
Eosinophils Relative: 3 %
HCT: 38.1 % — ABNORMAL LOW (ref 39.0–52.0)
Hemoglobin: 13.4 g/dL (ref 13.0–17.0)
Immature Granulocytes: 2 %
Lymphocytes Relative: 9 %
Lymphs Abs: 0.2 10*3/uL — ABNORMAL LOW (ref 0.7–4.0)
MCH: 30.3 pg (ref 26.0–34.0)
MCHC: 35.2 g/dL (ref 30.0–36.0)
MCV: 86.2 fL (ref 80.0–100.0)
Monocytes Absolute: 0.3 10*3/uL (ref 0.1–1.0)
Monocytes Relative: 10 %
Neutro Abs: 2.1 10*3/uL (ref 1.7–7.7)
Neutrophils Relative %: 75 %
Platelet Count: 150 10*3/uL (ref 150–400)
RBC: 4.42 MIL/uL (ref 4.22–5.81)
RDW: 13.1 % (ref 11.5–15.5)
WBC Count: 2.8 10*3/uL — ABNORMAL LOW (ref 4.0–10.5)
nRBC: 0 % (ref 0.0–0.2)

## 2023-02-05 LAB — CMP (CANCER CENTER ONLY)
ALT: 20 U/L (ref 0–44)
AST: 20 U/L (ref 15–41)
Albumin: 3.9 g/dL (ref 3.5–5.0)
Alkaline Phosphatase: 71 U/L (ref 38–126)
Anion gap: 6 (ref 5–15)
BUN: 26 mg/dL — ABNORMAL HIGH (ref 8–23)
CO2: 29 mmol/L (ref 22–32)
Calcium: 9.2 mg/dL (ref 8.9–10.3)
Chloride: 107 mmol/L (ref 98–111)
Creatinine: 1.03 mg/dL (ref 0.61–1.24)
GFR, Estimated: >60 mL/min (ref >60)
Glucose, Bld: 102 mg/dL — ABNORMAL HIGH (ref 70–99)
Potassium: 4.2 mmol/L (ref 3.5–5.1)
Sodium: 142 mmol/L (ref 135–145)
Total Bilirubin: 0.7 mg/dL (ref 0.3–1.2)
Total Protein: 6.5 g/dL (ref 6.5–8.1)

## 2023-02-05 LAB — RAD ONC ARIA SESSION SUMMARY
Course Elapsed Days: 42
Plan Fractions Treated to Date: 1
Plan Prescribed Dose Per Fraction: 2 Gy
Plan Total Fractions Prescribed: 3
Plan Total Prescribed Dose: 6 Gy
Reference Point Dosage Given to Date: 2 Gy
Reference Point Session Dosage Given: 2 Gy
Session Number: 31

## 2023-02-05 MED ORDER — PACLITAXEL PROTEIN-BOUND CHEMO INJECTION 100 MG
45.0000 mg/m2 | Freq: Once | INTRAVENOUS | Status: AC
  Administered 2023-02-05: 75 mg via INTRAVENOUS
  Filled 2023-02-05: qty 15

## 2023-02-05 MED ORDER — SODIUM CHLORIDE 0.9 % IV SOLN: Freq: Once | INTRAVENOUS | Status: AC

## 2023-02-05 MED ORDER — SODIUM CHLORIDE 0.9 % IV SOLN
161.6000 mg | Freq: Once | INTRAVENOUS | Status: AC
  Administered 2023-02-05: 160 mg via INTRAVENOUS
  Filled 2023-02-05: qty 16

## 2023-02-05 MED ORDER — SODIUM CHLORIDE 0.9 % IV SOLN
10.0000 mg | Freq: Once | INTRAVENOUS | Status: AC
  Administered 2023-02-05: 10 mg via INTRAVENOUS
  Filled 2023-02-05: qty 10

## 2023-02-05 MED ORDER — PALONOSETRON HCL INJECTION 0.25 MG/5ML
0.2500 mg | Freq: Once | INTRAVENOUS | Status: AC
  Administered 2023-02-05: 0.25 mg via INTRAVENOUS
  Filled 2023-02-05: qty 5

## 2023-02-05 NOTE — Patient Instructions (Signed)
[facility]  Discharge Instructions: Thank you for choosing [facility] to provide your oncology and hematology care.   If you have a lab appointment with the [facility], please go directly to the [facility] and check in at the registration area.   Wear comfortable clothing and clothing appropriate for easy access to any Portacath or PICC line.   We strive to give you quality time with your provider. You may need to reschedule your appointment if you arrive late (15 or more minutes).  Arriving late affects you and other patients whose appointments are after yours.  Also, if you miss three or more appointments without notifying the office, you may be dismissed from the clinic at the provider's discretion.      For prescription refill requests, have your pharmacy contact our office and allow 72 hours for refills to be completed.    Today you received the following chemotherapy and/or immunotherapy agents: paclitaxel-protein bound and carboplatin      To help prevent nausea and vomiting after your treatment, we encourage you to take your nausea medication as directed.  BELOW ARE SYMPTOMS THAT SHOULD BE REPORTED IMMEDIATELY: *FEVER GREATER THAN 100.4 F (38 C) OR HIGHER *CHILLS OR SWEATING *NAUSEA AND VOMITING THAT IS NOT CONTROLLED WITH YOUR NAUSEA MEDICATION *UNUSUAL SHORTNESS OF BREATH *UNUSUAL BRUISING OR BLEEDING *URINARY PROBLEMS (pain or burning when urinating, or frequent urination) *BOWEL PROBLEMS (unusual diarrhea, constipation, pain near the anus) TENDERNESS IN MOUTH AND THROAT WITH OR WITHOUT PRESENCE OF ULCERS (sore throat, sores in mouth, or a toothache) UNUSUAL RASH, SWELLING OR PAIN  UNUSUAL VAGINAL DISCHARGE OR ITCHING   Items with * indicate a potential emergency and should be followed up as soon as possible or go to the Emergency Department if any problems should occur.  Please show the CHEMOTHERAPY ALERT CARD or  IMMUNOTHERAPY ALERT CARD at check-in to the Emergency Department and triage nurse.  Should you have questions after your visit or need to cancel or reschedule your appointment, please contact [facility]  Dept: [phone]  and follow the prompts.  Office hours are 8:00 a.m. to 4:30 p.m. Monday - Friday. Please note that voicemails left after 4:00 p.m. may not be returned until the following business day.  We are closed weekends and major holidays. You have access to a nurse at all times for urgent questions. Please call the main number to the clinic Dept: [phone] and follow the prompts.   For any non-urgent questions, you may also contact your provider using MyChart. We now offer e-Visits for anyone 28 and older to request care online for non-urgent symptoms. For details visit [email or web address].   Also download the MyChart app! Go to the app store, search "MyChart", open the app, select [facility], and log in with your MyChart username and password.

## 2023-02-06 ENCOUNTER — Other Ambulatory Visit: Payer: Self-pay

## 2023-02-06 ENCOUNTER — Ambulatory Visit
Admission: RE | Admit: 2023-02-06 | Discharge: 2023-02-06 | Disposition: A | Discharge status: Home / Self Care | Payer: Medicare Other | Source: Ambulatory Visit | Attending: Radiation Oncology | Admitting: Radiation Oncology

## 2023-02-06 DIAGNOSIS — Z510 Encounter for antineoplastic radiation therapy: Secondary | ICD-10-CM | POA: Diagnosis not present

## 2023-02-06 DIAGNOSIS — C3411 Malignant neoplasm of upper lobe, right bronchus or lung: Secondary | ICD-10-CM | POA: Diagnosis not present

## 2023-02-06 DIAGNOSIS — Z5111 Encounter for antineoplastic chemotherapy: Secondary | ICD-10-CM | POA: Diagnosis not present

## 2023-02-06 DIAGNOSIS — K208 Other esophagitis without bleeding: Secondary | ICD-10-CM | POA: Diagnosis not present

## 2023-02-06 LAB — RAD ONC ARIA SESSION SUMMARY
Course Elapsed Days: 43
Plan Fractions Treated to Date: 2
Plan Prescribed Dose Per Fraction: 2 Gy
Plan Total Fractions Prescribed: 3
Plan Total Prescribed Dose: 6 Gy
Reference Point Dosage Given to Date: 4 Gy
Reference Point Session Dosage Given: 2 Gy
Session Number: 32

## 2023-02-07 ENCOUNTER — Ambulatory Visit
Admission: RE | Admit: 2023-02-07 | Discharge: 2023-02-07 | Disposition: A | Discharge status: Home / Self Care | Payer: Medicare Other | Source: Ambulatory Visit | Attending: Radiation Oncology | Admitting: Radiation Oncology

## 2023-02-07 ENCOUNTER — Encounter: Payer: Self-pay | Admitting: Radiation Oncology

## 2023-02-07 ENCOUNTER — Other Ambulatory Visit: Payer: Self-pay

## 2023-02-07 DIAGNOSIS — Z5111 Encounter for antineoplastic chemotherapy: Secondary | ICD-10-CM | POA: Diagnosis not present

## 2023-02-07 DIAGNOSIS — K208 Other esophagitis without bleeding: Secondary | ICD-10-CM | POA: Diagnosis not present

## 2023-02-07 DIAGNOSIS — Z510 Encounter for antineoplastic radiation therapy: Secondary | ICD-10-CM | POA: Diagnosis not present

## 2023-02-07 DIAGNOSIS — C3411 Malignant neoplasm of upper lobe, right bronchus or lung: Secondary | ICD-10-CM | POA: Diagnosis not present

## 2023-02-07 LAB — RAD ONC ARIA SESSION SUMMARY
Course Elapsed Days: 44
Plan Fractions Treated to Date: 3
Plan Prescribed Dose Per Fraction: 2 Gy
Plan Total Fractions Prescribed: 3
Plan Total Prescribed Dose: 6 Gy
Reference Point Dosage Given to Date: 6 Gy
Reference Point Session Dosage Given: 2 Gy
Session Number: 33

## 2023-02-08 DIAGNOSIS — S8261XA Displaced fracture of lateral malleolus of right fibula, initial encounter for closed fracture: Secondary | ICD-10-CM | POA: Diagnosis not present

## 2023-02-09 ENCOUNTER — Telehealth: Payer: Self-pay | Admitting: Internal Medicine

## 2023-02-09 NOTE — Telephone Encounter (Signed)
Called patient regarding upcoming April appointments, patient is notified. 

## 2023-02-12 ENCOUNTER — Encounter: Payer: Self-pay | Admitting: Internal Medicine

## 2023-02-12 ENCOUNTER — Telehealth: Payer: Self-pay | Admitting: Radiation Oncology

## 2023-02-12 NOTE — Telephone Encounter (Signed)
The patient called back and feels like his esophagitis is manageable. We discussed a few additional ways of improving his symptoms with OTC options and he will try to take carafate more regularly.

## 2023-02-12 NOTE — Progress Notes (Signed)
LM 02/12/23 for the patient to see how his esophagitis is as he just finished radiation last week.

## 2023-02-12 NOTE — Progress Notes (Addendum)
  Radiation Oncology         [phone] ________________________________  Name: MEREDITH HOESE MRN: [id number]  Date: 02/07/2023  DOB: 11-13-1941  End of Treatment Note  Diagnosis:  Stage IIIB, cT3N2M0, NSCLC, squamous cell carcinoma of the RUL.    Indication for treatment: Curative       Radiation treatment dates:   12/25/22-02/07/23  Site/dose:   The patient was treated to the disease within the RUL lung initially to a dose of 60 Gy using a 4 field, 3-D conformal technique. The patient then received a cone down boost treatment for an additional 6 Gy. This yielded a final total dose of 66 Gy.   Narrative: The patient tolerated radiation treatment relatively well.   The patient did experience esophagitis during the course of treatment which did not required management, though he had a prescription for Carafate to use if needed.  Plan: The patient will receive a call in about one month from the radiation oncology department. He will continue follow up with Dr. Julien Nordmann as well.      Carola Rhine, PAC

## 2023-02-13 ENCOUNTER — Telehealth: Payer: Self-pay | Admitting: *Deleted

## 2023-02-13 ENCOUNTER — Ambulatory Visit: Payer: Medicare Other | Admitting: Physician Assistant

## 2023-02-13 ENCOUNTER — Ambulatory Visit: Payer: Medicare Other

## 2023-02-13 ENCOUNTER — Other Ambulatory Visit: Payer: Medicare Other

## 2023-02-13 ENCOUNTER — Other Ambulatory Visit: Payer: Self-pay

## 2023-02-13 NOTE — Telephone Encounter (Signed)
PACIFIC-9 [id number]: A Phase III, double-blind, placebo-controlled, Randomised, Multicentre, International Study of Durvalumab Plus Oleclumab and Durvalumab Plus Monalizumab in Patients With Locally Advanced (Stage III), Unresectable Non-small Cell Lung Cancer (NSCLC) Who Have Not Progressed Following Definitive, Platinum-Based Concurrent Chemoradiation Therapy  Research nurse was notified by study CRA that the tissue submitted to [employer] was not able to be tested as it did not meet the testing requirements.  Informed patient that he would not be eligible for part 2 of the study.  Thanked him for his participation and he verbalized understanding.  Dr. Julien Nordmann notified.  Alan Mendez, BSN, RN, [facility] Nurse II [phone] 02/13/2023 1:50 PM

## 2023-02-23 ENCOUNTER — Other Ambulatory Visit: Payer: Self-pay

## 2023-03-01 ENCOUNTER — Ambulatory Visit (HOSPITAL_COMMUNITY)
Admission: RE | Admit: 2023-03-01 | Discharge: 2023-03-01 | Disposition: A | Discharge status: Home / Self Care | Payer: Medicare Other | Source: Ambulatory Visit | Attending: Physician Assistant | Admitting: Physician Assistant

## 2023-03-01 DIAGNOSIS — C3490 Malignant neoplasm of unspecified part of unspecified bronchus or lung: Secondary | ICD-10-CM | POA: Diagnosis not present

## 2023-03-01 DIAGNOSIS — J439 Emphysema, unspecified: Secondary | ICD-10-CM | POA: Diagnosis not present

## 2023-03-01 DIAGNOSIS — R918 Other nonspecific abnormal finding of lung field: Secondary | ICD-10-CM | POA: Diagnosis not present

## 2023-03-01 DIAGNOSIS — C3491 Malignant neoplasm of unspecified part of right bronchus or lung: Secondary | ICD-10-CM | POA: Diagnosis not present

## 2023-03-01 MED ORDER — IOHEXOL 300 MG/ML  SOLN
75.0000 mL | Freq: Once | INTRAMUSCULAR | Status: AC | PRN
  Administered 2023-03-01: 75 mL via INTRAVENOUS

## 2023-03-01 MED ORDER — SODIUM CHLORIDE (PF) 0.9 % IJ SOLN
INTRAMUSCULAR | Status: AC
  Filled 2023-03-01: qty 50

## 2023-03-01 NOTE — Progress Notes (Signed)
[facility] OFFICE PROGRESS NOTE  [name], [name], MD [address] [place] [address]  DIAGNOSIS: Stage IIIb (T3, N2, M0) non-small cell lung cancer, squamous cell carcinoma presented with 2 separate right upper lobe lung nodule in addition to mediastinal lymphadenopathy diagnosed in January 2024    PDL1 expression: 1%  PRIOR THERAPY: A course of concurrent chemoradiation with weekly carboplatin for AUC of 2 and paclitaxel 45 Mg/M2. Last dose on 02/05/23. Status post 6 cycles. Taxol changed to abraxane starting from cycle #4 due to reaction to taxol   CURRENT THERAPY: Consolidation immunotherapy with Imfinzi 1500 mg IV every 4 weeks.  First dose expected on 03/12/23.   INTERVAL HISTORY: Alan Mendez 82 y.o. male returns to the clinic today for a follow-up visit accompanied by his wife.  Last month, the patient completed a course of concurrent chemoradiation.  His last a chemo was on 02/05/2023.  He tolerated treatment well except he did have a reaction to Taxol which was eventually changed to Abraxane.  He had some manageable fatigue and mild esophagitis from treatment.  In the interval since last being seen, the patient has gotten married.  He is feeling fairly well this time except he continues to have some residual mild pain with swallowing. He tends to feel this more with certain foods such as chips. He has some carafate.  He denies any fever, chills, night sweats, or weight loss.  Denies any chest pain or hemoptysis.  His breathing is "fine" but sometimes he may get cough if he swallows something that irritates his through. His breathing is fine but sometimes deep breaths may exacerbate his sore throat.  Denies any significant dyspnea on exertion.  Denies any nausea, vomiting, constipation, or diarrhea. He denies any headache or visual changes.  He recently had a restaging CT scan performed.  He is here today for evaluation and to review his scan results and for more a  detailed discussion about his current condition and next steps in his care.  MEDICAL HISTORY: Past Medical History:  Diagnosis Date   Acquired solitary kidney    left --  s/p  right nephroureterctomy 01/ 2018   Anxiety    no current problems   Depression    no current problems   Elevated PSA    Enlarged lymph node 11/2022   enlarged right paratracheal lymph node.   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, negative margins)   History of kidney stones    passed stones and also surgery to remove   History of unilateral nephrectomy    01/ 2018  right nephroureterectomy for renal pelvis mass (carcinoma in situ)   HLD (hyperlipidemia)    on crestor   Hyperplasia of prostate without lower urinary tract symptoms (LUTS)    Lung cancer (HCC) 12/07/2022   Nephrolithiasis    bilateral non-obstructive   Pneumonia    x 1 - yrs ago   Pulmonary nodules 11/2022   2 right upper lobe pulmonary nodules   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HCC)    Renal cyst, acquired, right    right kidney removed   Wears glasses     ALLERGIES:  is allergic to paclitaxel and lamisil [terbinafine].  MEDICATIONS:  Current Outpatient Medications  Medication Sig Dispense Refill   cholecalciferol (VITAMIN D3) 25 MCG (1000 UNIT) tablet Take 1,000 Units by mouth daily.     Multiple Vitamins-Minerals (MULTIVITAMIN WITH MINERALS) tablet Take 1 tablet by mouth daily. Centrum     prochlorperazine (COMPAZINE) 10 MG tablet Take 10 mg by mouth every 6 (six) hours as needed for nausea or vomiting. (Patient not taking: Reported on 01/15/2023)     rosuvastatin (CRESTOR) 10 MG tablet TAKE 1 TABLET EACH DAY. (Patient taking differently: Take 20 mg by mouth daily.) 30 tablet 0   tamsulosin (FLOMAX) 0.4 MG  CAPS capsule Take 1 capsule (0.4 mg total) by mouth daily. (Patient taking differently: Take 0.4 mg by mouth daily after breakfast.) 30 capsule 5   No current facility-administered medications for this visit.    SURGICAL HISTORY:  Past Surgical History:  Procedure Laterality Date   APPENDECTOMY  child   BRONCHIAL BIOPSY  12/07/2022   Procedure: BRONCHIAL BIOPSIES;  Surgeon: Josephine Igo, DO;  Location: [facility];  Service: Pulmonary;;   BRONCHIAL BRUSHINGS  12/07/2022   Procedure: BRONCHIAL BRUSHINGS;  Surgeon: Josephine Igo, DO;  Location: [facility];  Service: Pulmonary;;   BRONCHIAL NEEDLE ASPIRATION BIOPSY  12/07/2022   Procedure: BRONCHIAL NEEDLE ASPIRATION BIOPSIES;  Surgeon: Josephine Igo, DO;  Location: [facility];  Service: Pulmonary;;   CATARACT EXTRACTION W/ INTRAOCULAR LENS  IMPLANT, BILATERAL  2017   COLONOSCOPY  08/15/2012   Tubular Adenoma, No high grade dysplasia or malignacy.   CYSTOSCOPY W/ RETROGRADES Right 09/25/2016   Procedure: CYSTOSCOPY, URETHRAL DILITATION  WITH RETROGRADE PYELOGRAM, BRUSH BIOPSIES OF RIGHT RENAL MASS;  Surgeon: Jethro Bolus, MD;  Location: [facility];  Service: Urology;  Laterality: Right;   CYSTOSCOPY W/ RETROGRADES Left 03/16/2017   Procedure: CYSTOSCOPY WITH RETROGRADE PYELOGRAM;  Surgeon: Crist Fat, MD;  Location: [facility];  Service: Urology;  Laterality: Left;   CYSTOSCOPY W/ URETERAL STENT REMOVAL Right 09/25/2016   Procedure: CYSTOSCOPY WITH STENT REMOVAL;  Surgeon: Jethro Bolus, MD;  Location: [facility];  Service: Urology;  Laterality: Right;   CYSTOSCOPY WITH RETROGRADE PYELOGRAM, URETEROSCOPY AND STENT PLACEMENT Right 08/21/2016   Procedure: CYSTOSCOPY WITH RIGHT RETROGRADE PYELOGRAM, RIGHT FLEXIBLE AND RIGID URETEROSCOPY, INSERTION DOUBLE J STENT RIGHT;  Surgeon: Jethro Bolus, MD;  Location: [facility];  Service: Urology;  Laterality:  Right;   EXTRACORPOREAL SHOCK WAVE LITHOTRIPSY  2009   KNEE SURGERY Right 1980's   ROBOT ASSITED LAPAROSCOPIC NEPHROURETERECTOMY Right 11/15/2016   Procedure: RIGHT XI ROBOT ASSITED LAPAROSCOPIC NEPHROURETERECTOMY;  Surgeon: Crist Fat, MD;  Location: [facility];  Service: Urology;  Laterality: Right;   TRANSURETHRAL RESECTION OF BLADDER TUMOR WITH MITOMYCIN-C Left 03/16/2017   Procedure: CYSTOSCOPY BIOPSIES OF BLADDER TUMOR WITH FULGURATION;  Surgeon: Crist Fat, MD;  [facility];  Service: Urology;  Laterality: Left;   URETEROSCOPY Right 09/25/2016   Procedure: RIGHT URETEROSCOPY;  Surgeon: Jethro Bolus, MD;  Location: [facility];  Service: Urology;  Laterality: Right;   VIDEO BRONCHOSCOPY WITH ENDOBRONCHIAL ULTRASOUND Right 12/07/2022   Procedure: VIDEO BRONCHOSCOPY WITH ENDOBRONCHIAL ULTRASOUND;  Surgeon: Josephine Igo, DO;  Location: [facility];  Service: Pulmonary;  Laterality: Right;    REVIEW OF SYSTEMS:   Review of Systems  Constitutional: Negative for appetite change, chills, fatigue, fever and unexpected weight change.  HENT: Positive for mild esophagitis. Negative for mouth sores, nosebleeds, and trouble swallowing.   Eyes: Negative for eye problems and  icterus.  Respiratory: Positive for intermittent cough if pain with swallowing. Negative for  hemoptysis, shortness of breath and wheezing.   Cardiovascular: Negative for chest pain and leg swelling.  Gastrointestinal: Negative for abdominal pain, constipation, diarrhea, nausea and vomiting.  Genitourinary: Negative for bladder incontinence, difficulty urinating, dysuria, frequency and hematuria.   Musculoskeletal: Negative for back pain, gait problem, neck pain and neck stiffness.  Skin: Negative for itching and rash.  Neurological: Negative for dizziness, extremity weakness, gait problem, headaches, light-headedness and seizures.  Hematological: Negative for adenopathy.  Does not bruise/bleed easily.  Psychiatric/Behavioral: Negative for confusion, depression and sleep disturbance. The patient is not nervous/anxious.     PHYSICAL EXAMINATION:  There were no vitals taken for this visit.  ECOG PERFORMANCE STATUS: 1  Physical Exam  Constitutional: Oriented to person, place, and time and well-developed, well-nourished, and in no distress.  HENT:  Head: Normocephalic and atraumatic.  Mouth/Throat: Oropharynx is clear and moist. No oropharyngeal exudate.  Eyes: Conjunctivae are normal. Right eye exhibits no discharge. Left eye exhibits no discharge. No scleral icterus.  Neck: Normal range of motion. Neck supple.  Cardiovascular: Normal rate, regular rhythm, normal heart sounds and intact distal pulses.   Pulmonary/Chest: Effort normal and breath sounds normal. No respiratory distress. No wheezes. No rales.  Abdominal: Soft. Bowel sounds are normal. Exhibits no distension and no mass. There is no tenderness.  Musculoskeletal: Normal range of motion. Exhibits no edema.  Lymphadenopathy:    No cervical adenopathy.  Neurological: Alert and oriented to person, place, and time. Exhibits normal muscle tone. Gait normal. Coordination normal.  Skin: Skin is warm and dry. No rash noted. Not diaphoretic. No erythema. No pallor.  Psychiatric: Mood, memory and judgment normal.  Vitals reviewed.  LABORATORY DATA: Lab Results  Component Value Date   WBC 2.8 (L) 02/05/2023   HGB 13.4 02/05/2023   HCT 38.1 (L) 02/05/2023   MCV 86.2 02/05/2023   PLT 150 02/05/2023      Chemistry      Component Value Date/Time   NA 142 02/05/2023 1330   K 4.2 02/05/2023 1330   CL 107 02/05/2023 1330   CO2 29 02/05/2023 1330   BUN 26 (H) 02/05/2023 1330   CREATININE 1.03 02/05/2023 1330   CREATININE 0.85 12/10/2014 1550      Component Value Date/Time   CALCIUM 9.2 02/05/2023 1330   ALKPHOS 71 02/05/2023 1330   AST 20 02/05/2023 1330   ALT 20 02/05/2023 1330   BILITOT 0.7  02/05/2023 1330       RADIOGRAPHIC STUDIES:  No results found.   ASSESSMENT/PLAN:  This is a very pleasant 82 year old  Caucasian male with Stage IIIb (T3, N2, M0) non-small cell lung cancer, squamous cell carcinoma presented with 2 separate right upper lobe lung nodule in addition to mediastinal lymphadenopathy diagnosed in January 2024. His PDL1 expression is 1%.    The patient completed a course of concurrent chemoradiation with weekly carboplatin for AUC of 2 and paclitaxel 45 Mg/M2.  Status post 6 cycles. Starting from cycle #4, the taxol was switched to abraxane due to an infusion reaction to taxol.  Patient's last chemotherapy was on 02/05/2023.  The patient recently had a restaging CT scan performed.  Dr. Arbutus Ped personally and independently reviewed the scan and discussed the results with the patient today.  The scan showed no evidence of disease progression. There was some positive response in the right lung nodules. Dr. Arbutus Ped recommends the patient start consolidation immunotherapy with  Imfinzi 1500 mg IV every 4 weeks.  The patient is interested in this option and we will arrange for his first cycle of treatment on 03/12/2023.  I discussed with her the adverse effect of the immunotherapy including but not limited to immunotherapy mediated skin rash, diarrhea, inflammation of the lung, kidney, liver, thyroid or other endocrine dysfunction  See the patient back for follow-up visit in 5 weeks for evaluation repeat blood work before undergoing cycle #2.  Discussed the esophagitis from radiation may take a few weeks to improve. In the meantime, he has Carafate if needed. He also was advised to avoid foods that exacerbate his pain such as chips. Overall, his symptoms are improving.   The patient was advised to call immediately if he has any concerning symptoms in the interval. The patient voices understanding of current disease status and treatment options and is in agreement with the  current care plan. All questions were answered. The patient knows to call the clinic with any problems, questions or concerns. We can certainly see the patient much sooner if necessary     Labs were reviewed. Recommend he proceed with cycle #5 today as scheduled.       No orders of the defined types were placed in this encounter.    Alegandro Macnaughton L Zayli Villafuerte, PA-C 03/01/23  ADDENDUM: Hematology/Oncology Attending: I had a face-to-face encounter with the patient today.  I reviewed his record, lab, scan and recommended his care plan.  This is a very pleasant 82 years old white male with a stage IIIb (T3, N2, M0) non-small cell lung cancer, squamous cell carcinoma presented with 2 separate right upper lobe lung nodules in addition to mediastinal lymphadenopathy diagnosed in January 2024 with PD-L1 expression of 1%. The patient underwent a course of concurrent chemoradiation initially with carboplatin and paclitaxel then paclitaxel was replaced by Abraxane secondary to hypersensitivity reaction to paclitaxel. The patient tolerated his treatment well except for the radiation-induced esophagitis with odynophagia. He was able to make it to his own wedding in early April 2024 and he enjoyed honeymoon in [place].  He gained a lot of weight.  He is feeling much better except for the residual odynophagia. He had repeat CT scan of the chest performed recently.  I personally and independently reviewed the scan images and discussed the result and showed the images to the patient and his wife. His scan showed improvement in the pulmonary nodules with significant decrease in size with stable mediastinal lymphadenopathy. I recommended for the patient to proceed with consolidation treatment with immunotherapy with Imfinzi 1500 Mg IV every 4 weeks for total of 1 year.  I discussed with the patient the survival benefit of the consolidation immunotherapy. I also discussed with him the adverse effect of this  treatment including but not limited to immunotherapy mediated skin rash, diarrhea, inflammation of the lung, kidney, liver, thyroid or other endocrine dysfunction. The patient is expected to start the first cycle of this treatment next week. He will come back for follow-up visit in 5 weeks for evaluation before starting cycle #2. He was advised to call immediately if he has any other concerning symptoms in the interval. The total time spent in the appointment was 30 minutes. Disclaimer: This note was dictated with voice recognition software. Similar sounding words can inadvertently be transcribed and may be missed upon review. Lajuana Matte, MD

## 2023-03-05 ENCOUNTER — Inpatient Hospital Stay: Payer: Medicare Other | Attending: Internal Medicine

## 2023-03-05 ENCOUNTER — Inpatient Hospital Stay (HOSPITAL_BASED_OUTPATIENT_CLINIC_OR_DEPARTMENT_OTHER): Payer: Medicare Other | Admitting: Physician Assistant

## 2023-03-05 ENCOUNTER — Encounter: Payer: Self-pay | Admitting: Internal Medicine

## 2023-03-05 VITALS — BP 136/90 | HR 83 | Temp 98.6°F | Resp 16 | Wt 149.2 lb

## 2023-03-05 DIAGNOSIS — Z5112 Encounter for antineoplastic immunotherapy: Secondary | ICD-10-CM | POA: Insufficient documentation

## 2023-03-05 DIAGNOSIS — C3491 Malignant neoplasm of unspecified part of right bronchus or lung: Secondary | ICD-10-CM

## 2023-03-05 DIAGNOSIS — C3411 Malignant neoplasm of upper lobe, right bronchus or lung: Secondary | ICD-10-CM | POA: Insufficient documentation

## 2023-03-05 DIAGNOSIS — K208 Other esophagitis without bleeding: Secondary | ICD-10-CM | POA: Diagnosis not present

## 2023-03-05 DIAGNOSIS — Z7189 Other specified counseling: Secondary | ICD-10-CM

## 2023-03-05 DIAGNOSIS — Z79620 Long term (current) use of immunosuppressive biologic: Secondary | ICD-10-CM | POA: Insufficient documentation

## 2023-03-05 DIAGNOSIS — R911 Solitary pulmonary nodule: Secondary | ICD-10-CM

## 2023-03-05 LAB — COMPREHENSIVE METABOLIC PANEL
ALT: 17 U/L (ref 0–44)
AST: 23 U/L (ref 15–41)
Albumin: 4.1 g/dL (ref 3.5–5.0)
Alkaline Phosphatase: 77 U/L (ref 38–126)
Anion gap: 7 (ref 5–15)
BUN: 19 mg/dL (ref 8–23)
CO2: 27 mmol/L (ref 22–32)
Calcium: 9.3 mg/dL (ref 8.9–10.3)
Chloride: 105 mmol/L (ref 98–111)
Creatinine, Ser: 1.18 mg/dL (ref 0.61–1.24)
GFR, Estimated: >60 mL/min (ref >60)
Glucose, Bld: 89 mg/dL (ref 70–99)
Potassium: 4.1 mmol/L (ref 3.5–5.1)
Sodium: 139 mmol/L (ref 135–145)
Total Bilirubin: 0.6 mg/dL (ref 0.3–1.2)
Total Protein: 7.2 g/dL (ref 6.5–8.1)

## 2023-03-05 LAB — CBC WITH DIFFERENTIAL/PLATELET
Abs Immature Granulocytes: 0.04 10*3/uL (ref 0.00–0.07)
Basophils Absolute: 0 10*3/uL (ref 0.0–0.1)
Basophils Relative: 1 %
Eosinophils Absolute: 0.1 10*3/uL (ref 0.0–0.5)
Eosinophils Relative: 2 %
HCT: 38 % — ABNORMAL LOW (ref 39.0–52.0)
Hemoglobin: 13.3 g/dL (ref 13.0–17.0)
Immature Granulocytes: 1 %
Lymphocytes Relative: 14 %
Lymphs Abs: 0.6 10*3/uL — ABNORMAL LOW (ref 0.7–4.0)
MCH: 30.4 pg (ref 26.0–34.0)
MCHC: 35 g/dL (ref 30.0–36.0)
MCV: 87 fL (ref 80.0–100.0)
Monocytes Absolute: 0.7 10*3/uL (ref 0.1–1.0)
Monocytes Relative: 16 %
Neutro Abs: 2.7 10*3/uL (ref 1.7–7.7)
Neutrophils Relative %: 66 %
Platelets: 234 10*3/uL (ref 150–400)
RBC: 4.37 MIL/uL (ref 4.22–5.81)
RDW: 15 % (ref 11.5–15.5)
WBC: 4 10*3/uL (ref 4.0–10.5)
nRBC: 0 % (ref 0.0–0.2)

## 2023-03-05 NOTE — Progress Notes (Signed)
DISCONTINUE ON PATHWAY REGIMEN - Non-Small Cell Lung     A cycle is every 7 days, concurrent with RT:     Paclitaxel      Carboplatin   **Always confirm dose/schedule in your pharmacy ordering system**  REASON: Continuation Of Treatment PRIOR TREATMENT: [id number]: Carboplatin AUC=2 + Paclitaxel 45 mg/m2 Weekly During Radiation TREATMENT RESPONSE: Partial Response (PR)  START ON PATHWAY REGIMEN - Non-Small Cell Lung     A cycle is every 28 days:     Durvalumab   **Always confirm dose/schedule in your pharmacy ordering system**  Patient Characteristics: Preoperative or Nonsurgical Candidate (Clinical Staging), Stage III - Nonsurgical Candidate (Nonsquamous and Squamous), PS = 0, 1 Therapeutic Status: Preoperative or Nonsurgical Candidate (Clinical Staging) AJCC T Category: cT3 AJCC N Category: cN2 AJCC M Category: cM0 AJCC 8 Stage Grouping: IIIB ECOG Performance Status: 1 Intent of Therapy: Curative Intent, Discussed with Patient 

## 2023-03-05 NOTE — Patient Instructions (Addendum)
-  We covered a lot of important information at your appointment today regarding what the treatment plan is moving forward. Here are the the main points that were discussed at your office visit with [place] today:  -The treatment will consist of a new medication. This is not chemotherapy. This new drug is a type of Immunotherapy called Imfinzi (Durvalumab).  -We are planning on starting your treatment next week on 03/12/23  -Your treatment will be given once every 4 weeks. You will receive this treatment every four weeks for a total of 1 year (13 total treatments) unless you experience unacceptable toxicity or if there is evidence on your routine CT scans that the cancer is growing  -We will get a CT scan after every 3 treatments to check on the progress of treatment  Side Effects:  -The adverse effect of the immunotherapy including but not limited to immunotherapy mediated skin rash, diarrhea, inflammation of the lung, kidney, liver, thyroid or other endocrine dysfunction  Follow up:  -We will see you back for a follow up visit in __.

## 2023-03-07 ENCOUNTER — Encounter: Payer: Self-pay | Admitting: Internal Medicine

## 2023-03-07 ENCOUNTER — Other Ambulatory Visit: Payer: Self-pay

## 2023-03-08 ENCOUNTER — Telehealth: Payer: Self-pay | Admitting: Internal Medicine

## 2023-03-08 ENCOUNTER — Other Ambulatory Visit: Payer: Self-pay

## 2023-03-08 DIAGNOSIS — S8261XA Displaced fracture of lateral malleolus of right fibula, initial encounter for closed fracture: Secondary | ICD-10-CM | POA: Diagnosis not present

## 2023-03-08 NOTE — Telephone Encounter (Signed)
Scheduled per 04/22 los, patient has been called and voicemail was left. 

## 2023-03-10 ENCOUNTER — Other Ambulatory Visit: Payer: Self-pay

## 2023-03-12 ENCOUNTER — Inpatient Hospital Stay: Payer: Medicare Other

## 2023-03-12 ENCOUNTER — Other Ambulatory Visit: Payer: Self-pay

## 2023-03-12 VITALS — BP 126/64 | HR 79 | Temp 98.5°F | Resp 18 | Ht 65.0 in | Wt 147.0 lb

## 2023-03-12 DIAGNOSIS — C3411 Malignant neoplasm of upper lobe, right bronchus or lung: Secondary | ICD-10-CM | POA: Diagnosis not present

## 2023-03-12 DIAGNOSIS — Z79620 Long term (current) use of immunosuppressive biologic: Secondary | ICD-10-CM | POA: Diagnosis not present

## 2023-03-12 DIAGNOSIS — C3491 Malignant neoplasm of unspecified part of right bronchus or lung: Secondary | ICD-10-CM

## 2023-03-12 DIAGNOSIS — Z5112 Encounter for antineoplastic immunotherapy: Secondary | ICD-10-CM | POA: Diagnosis not present

## 2023-03-12 DIAGNOSIS — K208 Other esophagitis without bleeding: Secondary | ICD-10-CM | POA: Diagnosis not present

## 2023-03-12 LAB — CMP (CANCER CENTER ONLY)
ALT: 20 U/L (ref 0–44)
AST: 23 U/L (ref 15–41)
Albumin: 4 g/dL (ref 3.5–5.0)
Alkaline Phosphatase: 75 U/L (ref 38–126)
Anion gap: 8 (ref 5–15)
BUN: 23 mg/dL (ref 8–23)
CO2: 25 mmol/L (ref 22–32)
Calcium: 9.2 mg/dL (ref 8.9–10.3)
Chloride: 108 mmol/L (ref 98–111)
Creatinine: 1.09 mg/dL (ref 0.61–1.24)
GFR, Estimated: >60 mL/min (ref >60)
Glucose, Bld: 130 mg/dL — ABNORMAL HIGH (ref 70–99)
Potassium: 3.5 mmol/L (ref 3.5–5.1)
Sodium: 141 mmol/L (ref 135–145)
Total Bilirubin: 0.8 mg/dL (ref 0.3–1.2)
Total Protein: 6.7 g/dL (ref 6.5–8.1)

## 2023-03-12 LAB — CBC WITH DIFFERENTIAL (CANCER CENTER ONLY)
Abs Immature Granulocytes: 0.03 10*3/uL (ref 0.00–0.07)
Basophils Absolute: 0 10*3/uL (ref 0.0–0.1)
Basophils Relative: 1 %
Eosinophils Absolute: 0.1 10*3/uL (ref 0.0–0.5)
Eosinophils Relative: 1 %
HCT: 36.4 % — ABNORMAL LOW (ref 39.0–52.0)
Hemoglobin: 13 g/dL (ref 13.0–17.0)
Immature Granulocytes: 1 %
Lymphocytes Relative: 12 %
Lymphs Abs: 0.5 10*3/uL — ABNORMAL LOW (ref 0.7–4.0)
MCH: 31 pg (ref 26.0–34.0)
MCHC: 35.7 g/dL (ref 30.0–36.0)
MCV: 86.7 fL (ref 80.0–100.0)
Monocytes Absolute: 0.5 10*3/uL (ref 0.1–1.0)
Monocytes Relative: 12 %
Neutro Abs: 3.2 10*3/uL (ref 1.7–7.7)
Neutrophils Relative %: 73 %
Platelet Count: 236 10*3/uL (ref 150–400)
RBC: 4.2 MIL/uL — ABNORMAL LOW (ref 4.22–5.81)
RDW: 15.1 % (ref 11.5–15.5)
WBC Count: 4.4 10*3/uL (ref 4.0–10.5)
nRBC: 0 % (ref 0.0–0.2)

## 2023-03-12 LAB — TSH: TSH: 2.18 u[IU]/mL (ref 0.350–4.500)

## 2023-03-12 MED ORDER — SODIUM CHLORIDE 0.9 % IV SOLN: Freq: Once | INTRAVENOUS | Status: AC

## 2023-03-12 MED ORDER — SODIUM CHLORIDE 0.9 % IV SOLN
1500.0000 mg | Freq: Once | INTRAVENOUS | Status: AC
  Administered 2023-03-12: 1500 mg via INTRAVENOUS
  Filled 2023-03-12: qty 30

## 2023-03-12 NOTE — Patient Instructions (Addendum)
[facility]  Discharge Instructions: Thank you for choosing [facility] to provide your oncology and hematology care.   If you have a lab appointment with the Cancer Center, please go directly to the Cancer Center and check in at the registration area.   Wear comfortable clothing and clothing appropriate for easy access to any Portacath or PICC line.   We strive to give you quality time with your provider. You may need to reschedule your appointment if you arrive late (15 or more minutes).  Arriving late affects you and other patients whose appointments are after yours.  Also, if you miss three or more appointments without notifying the office, you may be dismissed from the clinic at the provider's discretion.      For prescription refill requests, have your pharmacy contact our office and allow 72 hours for refills to be completed.    Today you received the following chemotherapy and/or immunotherapy agent: Durvalumab (Imfinzi)   To help prevent nausea and vomiting after your treatment, we encourage you to take your nausea medication as directed.  BELOW ARE SYMPTOMS THAT SHOULD BE REPORTED IMMEDIATELY: *FEVER GREATER THAN 100.4 F (38 C) OR HIGHER *CHILLS OR SWEATING *NAUSEA AND VOMITING THAT IS NOT CONTROLLED WITH YOUR NAUSEA MEDICATION *UNUSUAL SHORTNESS OF BREATH *UNUSUAL BRUISING OR BLEEDING *URINARY PROBLEMS (pain or burning when urinating, or frequent urination) *BOWEL PROBLEMS (unusual diarrhea, constipation, pain near the anus) TENDERNESS IN MOUTH AND THROAT WITH OR WITHOUT PRESENCE OF ULCERS (sore throat, sores in mouth, or a toothache) UNUSUAL RASH, SWELLING OR PAIN  UNUSUAL VAGINAL DISCHARGE OR ITCHING   Items with * indicate a potential emergency and should be followed up as soon as possible or go to the Emergency Department if any problems should occur.  Please show the CHEMOTHERAPY ALERT CARD or IMMUNOTHERAPY ALERT CARD  at check-in to the Emergency Department and triage nurse.  Should you have questions after your visit or need to cancel or reschedule your appointment, please contact [facility]  Dept: [phone]  and follow the prompts.  Office hours are 8:00 a.m. to 4:30 p.m. Monday - Friday. Please note that voicemails left after 4:00 p.m. may not be returned until the following business day.  We are closed weekends and major holidays. You have access to a nurse at all times for urgent questions. Please call the main number to the clinic Dept: [phone] and follow the prompts.   For any non-urgent questions, you may also contact your provider using MyChart. We now offer e-Visits for anyone 18 and older to request care online for non-urgent symptoms. For details visit [email or web address].   Also download the MyChart app! Go to the app store, search "MyChart", open the app, select [facility], and log in with your MyChart username and password.  Durvalumab Injection What is this medication? DURVALUMAB (dur VAL ue mab) treats some types of cancer. It works by helping your immune system slow or stop the spread of cancer cells. It is a monoclonal antibody. This medicine may be used for other purposes; ask your health care provider or pharmacist if you have questions. COMMON BRAND NAME(S): IMFINZI What should I tell my care team before I take this medication? They need to know if you have any of these conditions: Allogeneic stem cell transplant (uses someone else's stem cells) Autoimmune diseases, such as Crohn disease, ulcerative colitis, lupus History of chest radiation Nervous system problems, such   as Guillain-Barre syndrome, myasthenia gravis Organ transplant An unusual or allergic reaction to durvalumab, other medications, foods, dyes, or preservatives Pregnant or trying to get pregnant Breast-feeding How should I use this medication? This medication is  infused into a vein. It is given by your care team in a hospital or clinic setting. A special MedGuide will be given to you before each treatment. Be sure to read this information carefully each time. Talk to your care team about the use of this medication in children. Special care may be needed. Overdosage: If you think you have taken too much of this medicine contact a poison control center or emergency room at once. NOTE: This medicine is only for you. Do not share this medicine with others. What if I miss a dose? Keep appointments for follow-up doses. It is important not to miss your dose. Call your care team if you are unable to keep an appointment. What may interact with this medication? Interactions have not been studied. This list may not describe all possible interactions. Give your health care provider a list of all the medicines, herbs, non-prescription drugs, or dietary supplements you use. Also tell them if you smoke, drink alcohol, or use illegal drugs. Some items may interact with your medicine. What should I watch for while using this medication? Your condition will be monitored carefully while you are receiving this medication. You may need blood work while taking this medication. This medication may cause serious skin reactions. They can happen weeks to months after starting the medication. Contact your care team right away if you notice fevers or flu-like symptoms with a rash. The rash may be red or purple and then turn into blisters or peeling of the skin. You may also notice a red rash with swelling of the face, lips, or lymph nodes in your neck or under your arms. Tell your care team right away if you have any change in your eyesight. Talk to your care team if you may be pregnant. Serious birth defects can occur if you take this medication during pregnancy and for 3 months after the last dose. You will need a negative pregnancy test before starting this medication. Contraception  is recommended while taking this medication and for 3 months after the last dose. Your care team can help you find the option that works for you. Do not breastfeed while taking this medication and for 3 months after the last dose. What side effects may I notice from receiving this medication? Side effects that you should report to your care team as soon as possible: Allergic reactions--skin rash, itching, hives, swelling of the face, lips, tongue, or throat Dry cough, shortness of breath or trouble breathing Eye pain, redness, irritation, or discharge with blurry or decreased vision Heart muscle inflammation--unusual weakness or fatigue, shortness of breath, chest pain, fast or irregular heartbeat, dizziness, swelling of the ankles, feet, or hands Hormone gland problems--headache, sensitivity to light, unusual weakness or fatigue, dizziness, fast or irregular heartbeat, increased sensitivity to cold or heat, excessive sweating, constipation, hair loss, increased thirst or amount of urine, tremors or shaking, irritability Infusion reactions--chest pain, shortness of breath or trouble breathing, feeling faint or lightheaded Kidney injury (glomerulonephritis)--decrease in the amount of urine, red or dark brown urine, foamy or bubbly urine, swelling of the ankles, hands, or feet Liver injury--right upper belly pain, loss of appetite, nausea, light-colored stool, dark yellow or brown urine, yellowing skin or eyes, unusual weakness or fatigue Pain, tingling, or numbness in the   hands or feet, muscle weakness, change in vision, confusion or trouble speaking, loss of balance or coordination, trouble walking, seizures Rash, fever, and swollen lymph nodes Redness, blistering, peeling, or loosening of the skin, including inside the mouth Sudden or severe stomach pain, bloody diarrhea, fever, nausea, vomiting Side effects that usually do not require medical attention (report these to your care team if they  continue or are bothersome): Bone, joint, or muscle pain Diarrhea Fatigue Loss of appetite Nausea Skin rash This list may not describe all possible side effects. Call your doctor for medical advice about side effects. You may report side effects to FDA at 1-800-FDA-1088. Where should I keep my medication? This medication is given in a hospital or clinic. It will not be stored at home. NOTE: This sheet is a summary. It may not cover all possible information. If you have questions about this medicine, talk to your doctor, pharmacist, or health care provider.  2023 Elsevier/Gold Standard (2022-03-03 00:00:00)   

## 2023-03-13 ENCOUNTER — Encounter: Payer: Self-pay | Admitting: Medical Oncology

## 2023-03-14 LAB — T4: T4, Total: 8.7 ug/dL (ref 4.5–12.0)

## 2023-03-19 ENCOUNTER — Other Ambulatory Visit: Payer: Self-pay

## 2023-03-27 ENCOUNTER — Telehealth: Payer: Self-pay | Admitting: *Deleted

## 2023-03-27 DIAGNOSIS — S8261XA Displaced fracture of lateral malleolus of right fibula, initial encounter for closed fracture: Secondary | ICD-10-CM | POA: Diagnosis not present

## 2023-03-27 NOTE — Telephone Encounter (Signed)
Spoke with the patient regarding a message I received from his medical oncology team.  He states his throat continues to feel irritated, worse when eating meats.  He reports he is eating and drinking well, denies difficulty with breathing.  He is taking the carafate that was recommeded when he feels discomfort in his throat.  He was encouraged to take the medication as prescribed, before every meal and prior to bedtime.  He was informed that taking a teaspoon of cooking oil before eating can also help to lubricate his throat making it a little easier to eat.  He verbalized understanding and will give it a try.  He states overall he feels fine and knows that it may take a little while for his throat to completely heal from his radiation therapy.  No other concerns or questions.  He was encouraged to give [place] a call if anything arises.  Coralyn Helling. Vickii Chafe, BSN

## 2023-04-01 ENCOUNTER — Emergency Department (HOSPITAL_COMMUNITY)
Admission: EM | Admit: 2023-04-01 | Discharge: 2023-04-01 | Disposition: A | Discharge status: Home / Self Care | Payer: Medicare Other | Attending: Emergency Medicine | Admitting: Emergency Medicine

## 2023-04-01 ENCOUNTER — Other Ambulatory Visit: Payer: Self-pay

## 2023-04-01 DIAGNOSIS — R079 Chest pain, unspecified: Secondary | ICD-10-CM | POA: Diagnosis not present

## 2023-04-01 DIAGNOSIS — R238 Other skin changes: Secondary | ICD-10-CM

## 2023-04-01 DIAGNOSIS — M546 Pain in thoracic spine: Secondary | ICD-10-CM | POA: Insufficient documentation

## 2023-04-01 DIAGNOSIS — L231 Allergic contact dermatitis due to adhesives: Secondary | ICD-10-CM | POA: Diagnosis not present

## 2023-04-01 MED ORDER — SILVER SULFADIAZINE 1 % EX CREA: 1.0000 | TOPICAL_CREAM | Freq: Every day | CUTANEOUS | 0 refills | Status: AC

## 2023-04-01 NOTE — ED Provider Notes (Signed)
[facility] AT [facility] Provider Note   CSN: [id number] Arrival date & time: 04/01/23  1810     History {Add pertinent medical, surgical, social history, OB history to HPI:1} Chief Complaint  Patient presents with   sensitive skin    Alan Mendez is a 82 y.o. male who presents emergency department chief complaint of burning pain over his chest and upper back.  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  Patient's last dose of radiation therapy to the neck and chest was February 07, 2023 and last dose of carboplatin/paclitaxel and durvalumab HPI     Home Medications Prior to Admission medications   Medication Sig Start Date End Date Taking? Authorizing Provider  cholecalciferol (VITAMIN D3) 25 MCG (1000 UNIT) tablet Take 1,000 Units by mouth daily.    [provider]  Multiple Vitamins-Minerals (MULTIVITAMIN WITH MINERALS) tablet Take 1 tablet by mouth daily. Centrum    [provider]  prochlorperazine (COMPAZINE) 10 MG tablet Take 10 mg by mouth every 6 (six) hours as needed for nausea or vomiting. Patient not taking: Reported on 01/15/2023    Si Gaul, MD  rosuvastatin (CRESTOR) 10 MG tablet TAKE 1 TABLET EACH DAY. Patient taking differently: Take 20 mg by mouth daily. 12/31/15   Wallis Bamberg, PA-C  tamsulosin (FLOMAX) 0.4 MG CAPS capsule Take 1 capsule (0.4 mg total) by mouth daily. Patient taking differently: Take 0.4 mg by mouth daily after breakfast. 09/25/16   Jethro Bolus, MD      Allergies    Paclitaxel and Lamisil [terbinafine]    Review of Systems   Review of Systems  Physical Exam Updated Vital Signs BP (!) 122/93 (BP Location: Left Arm)   Pulse 86   Temp 98 F (36.7 C) (Oral)   Resp 16   Ht 5\' 5"  (1.651 m)   Wt 62.1 kg   SpO2 98%   BMI 22.80 kg/m  Physical Exam Vitals and nursing note reviewed.   Constitutional:      General: He is not in acute distress.    Appearance: He is well-developed. He is not diaphoretic.  HENT:     Head: Normocephalic and atraumatic.  Eyes:     General: No scleral icterus.    Conjunctiva/sclera: Conjunctivae normal.  Cardiovascular:     Rate and Rhythm: Normal rate and regular rhythm.     Heart sounds: Normal heart sounds.  Pulmonary:     Effort: Pulmonary effort is normal. No respiratory distress.     Breath sounds: Normal breath sounds.  Abdominal:     Palpations: Abdomen is soft.     Tenderness: There is no abdominal tenderness.  Musculoskeletal:     Cervical back: Normal range of motion and neck supple.  Skin:    General: Skin is warm and dry.     Findings: No erythema or rash.     Comments: No desquamation, no open sores, no peeling  Neurological:     Mental Status: He is alert.  Psychiatric:        Behavior: Behavior normal.     ED Results / Procedures / Treatments   Labs (all labs ordered are listed, but only abnormal results are displayed) Labs Reviewed - No data to display  EKG None  Radiology No results found.  Procedures Procedures  {Document cardiac monitor, telemetry assessment procedure when appropriate:1}  Medications Ordered in ED Medications - No data  to display  ED Course/ Medical Decision Making/ A&P   {   Click here for ABCD2, HEART and other calculatorsREFRESH Note before signing :1}                          Medical Decision Making  ***  {Document critical care time when appropriate:1} {Document review of labs and clinical decision tools ie heart score, Chads2Vasc2 etc:1}  {Document your independent review of radiology images, and any outside records:1} {Document your discussion with family members, caretakers, and with consultants:1} {Document social determinants of health affecting pt's care:1} {Document your decision making why or why not admission, treatments were needed:1} Final Clinical  Impression(s) / ED Diagnoses Final diagnoses:  None    Rx / DC Orders ED Discharge Orders     None

## 2023-04-01 NOTE — Discharge Instructions (Signed)
What are the symptoms of radiation recall Radiation recall is a rash that appears like a severe sun burn. It is characterized by one or more of the following:  Redness Tenderness Swelling Wet sores Peeling skin Discoloration after the skin has healed What is the treatment for radiation recall In some cases, symptoms may be severe enough that chemotherapy may have to be delayed until your skin heals. Usually, your doctor will try to give you medication to reduce the inflammation so that you can continue receiving the chemotherapy. Corticosteroids, in the form of a pill or a cream, may be prescribed for this purpose.   It is very important to protect the skin that is irritated:  Stay out of the sun Use sunscreens when you must be exposed Avoid tanning beds Wear loose, non-restrictive clothing  Get help right away if you: Have a fever and your symptoms suddenly get worse. Develop confusion. Have a severe headache or a stiff neck. Have severe joint pains or stiffness. Have a seizure. Develop a rash that covers all or most of your body. The rash may or may not be painful. Develop blisters that: Are on top of the rash. Grow larger or grow together. Are painful. Are inside your nose or mouth. Develop a rash that: Looks like purple pinprick-sized spots all over your body. Has a "bull's eye" or looks like a target. Is not related to sun exposure, is red and painful, and causes your skin to peel.

## 2023-04-01 NOTE — ED Triage Notes (Signed)
Pt arrived via POV. Pt on immunotherapy and c/o hypersensitive nerves on chest and back.  AOx4

## 2023-04-04 DIAGNOSIS — M25571 Pain in right ankle and joints of right foot: Secondary | ICD-10-CM | POA: Diagnosis not present

## 2023-04-06 ENCOUNTER — Other Ambulatory Visit: Payer: Self-pay

## 2023-04-10 ENCOUNTER — Inpatient Hospital Stay (HOSPITAL_BASED_OUTPATIENT_CLINIC_OR_DEPARTMENT_OTHER): Payer: Medicare Other | Admitting: Internal Medicine

## 2023-04-10 ENCOUNTER — Inpatient Hospital Stay: Payer: Medicare Other

## 2023-04-10 ENCOUNTER — Encounter: Payer: Self-pay | Admitting: Medical Oncology

## 2023-04-10 ENCOUNTER — Inpatient Hospital Stay: Payer: Medicare Other | Attending: Internal Medicine

## 2023-04-10 ENCOUNTER — Encounter: Payer: Self-pay | Admitting: Internal Medicine

## 2023-04-10 DIAGNOSIS — C3491 Malignant neoplasm of unspecified part of right bronchus or lung: Secondary | ICD-10-CM

## 2023-04-10 DIAGNOSIS — Z5112 Encounter for antineoplastic immunotherapy: Secondary | ICD-10-CM | POA: Diagnosis not present

## 2023-04-10 DIAGNOSIS — C3411 Malignant neoplasm of upper lobe, right bronchus or lung: Secondary | ICD-10-CM | POA: Diagnosis not present

## 2023-04-10 LAB — CBC WITH DIFFERENTIAL (CANCER CENTER ONLY)
Abs Immature Granulocytes: 0.02 10*3/uL (ref 0.00–0.07)
Basophils Absolute: 0 10*3/uL (ref 0.0–0.1)
Basophils Relative: 1 %
Eosinophils Absolute: 0.3 10*3/uL (ref 0.0–0.5)
Eosinophils Relative: 6 %
HCT: 40.1 % (ref 39.0–52.0)
Hemoglobin: 14.1 g/dL (ref 13.0–17.0)
Immature Granulocytes: 0 %
Lymphocytes Relative: 11 %
Lymphs Abs: 0.5 10*3/uL — ABNORMAL LOW (ref 0.7–4.0)
MCH: 31.1 pg (ref 26.0–34.0)
MCHC: 35.2 g/dL (ref 30.0–36.0)
MCV: 88.3 fL (ref 80.0–100.0)
Monocytes Absolute: 0.8 10*3/uL (ref 0.1–1.0)
Monocytes Relative: 17 %
Neutro Abs: 3.1 10*3/uL (ref 1.7–7.7)
Neutrophils Relative %: 65 %
Platelet Count: 197 10*3/uL (ref 150–400)
RBC: 4.54 MIL/uL (ref 4.22–5.81)
RDW: 12.8 % (ref 11.5–15.5)
WBC Count: 4.9 10*3/uL (ref 4.0–10.5)
nRBC: 0 % (ref 0.0–0.2)

## 2023-04-10 LAB — CMP (CANCER CENTER ONLY)
ALT: 13 U/L (ref 0–44)
AST: 21 U/L (ref 15–41)
Albumin: 4.1 g/dL (ref 3.5–5.0)
Alkaline Phosphatase: 66 U/L (ref 38–126)
Anion gap: 8 (ref 5–15)
BUN: 20 mg/dL (ref 8–23)
CO2: 25 mmol/L (ref 22–32)
Calcium: 9.1 mg/dL (ref 8.9–10.3)
Chloride: 108 mmol/L (ref 98–111)
Creatinine: 1.22 mg/dL (ref 0.61–1.24)
GFR, Estimated: >60 mL/min (ref >60)
Glucose, Bld: 105 mg/dL — ABNORMAL HIGH (ref 70–99)
Potassium: 4.4 mmol/L (ref 3.5–5.1)
Sodium: 141 mmol/L (ref 135–145)
Total Bilirubin: 0.9 mg/dL (ref 0.3–1.2)
Total Protein: 7.1 g/dL (ref 6.5–8.1)

## 2023-04-10 MED ORDER — SODIUM CHLORIDE 0.9 % IV SOLN: Freq: Once | INTRAVENOUS | Status: AC

## 2023-04-10 MED ORDER — SODIUM CHLORIDE 0.9 % IV SOLN
1500.0000 mg | Freq: Once | INTRAVENOUS | Status: AC
  Administered 2023-04-10: 1500 mg via INTRAVENOUS
  Filled 2023-04-10: qty 30

## 2023-04-10 NOTE — Progress Notes (Signed)
Patient seen by within treatment parameters.  Vitals are within treatment parameters.  Labs reviewed: and are within treatment parameters.  Per physician team, patient is ready for treatment and there are NO modifications to the treatment plan. Durvalumab

## 2023-04-10 NOTE — Patient Instructions (Signed)
[facility]  Discharge Instructions: Thank you for choosing [facility] to provide your oncology and hematology care.   If you have a lab appointment with the Cancer Center, please go directly to the Cancer Center and check in at the registration area.   Wear comfortable clothing and clothing appropriate for easy access to any Portacath or PICC line.   We strive to give you quality time with your provider. You may need to reschedule your appointment if you arrive late (15 or more minutes).  Arriving late affects you and other patients whose appointments are after yours.  Also, if you miss three or more appointments without notifying the office, you may be dismissed from the clinic at the provider's discretion.      For prescription refill requests, have your pharmacy contact our office and allow 72 hours for refills to be completed.    Today you received the following chemotherapy and/or immunotherapy agent: Durvalumab (Imfinzi)   To help prevent nausea and vomiting after your treatment, we encourage you to take your nausea medication as directed.  BELOW ARE SYMPTOMS THAT SHOULD BE REPORTED IMMEDIATELY: *FEVER GREATER THAN 100.4 F (38 C) OR HIGHER *CHILLS OR SWEATING *NAUSEA AND VOMITING THAT IS NOT CONTROLLED WITH YOUR NAUSEA MEDICATION *UNUSUAL SHORTNESS OF BREATH *UNUSUAL BRUISING OR BLEEDING *URINARY PROBLEMS (pain or burning when urinating, or frequent urination) *BOWEL PROBLEMS (unusual diarrhea, constipation, pain near the anus) TENDERNESS IN MOUTH AND THROAT WITH OR WITHOUT PRESENCE OF ULCERS (sore throat, sores in mouth, or a toothache) UNUSUAL RASH, SWELLING OR PAIN  UNUSUAL VAGINAL DISCHARGE OR ITCHING   Items with * indicate a potential emergency and should be followed up as soon as possible or go to the Emergency Department if any problems should occur.  Please show the CHEMOTHERAPY ALERT CARD or IMMUNOTHERAPY ALERT CARD  at check-in to the Emergency Department and triage nurse.  Should you have questions after your visit or need to cancel or reschedule your appointment, please contact [facility]  Dept: [phone]  and follow the prompts.  Office hours are 8:00 a.m. to 4:30 p.m. Monday - Friday. Please note that voicemails left after 4:00 p.m. may not be returned until the following business day.  We are closed weekends and major holidays. You have access to a nurse at all times for urgent questions. Please call the main number to the clinic Dept: [phone] and follow the prompts.   For any non-urgent questions, you may also contact your provider using MyChart. We now offer e-Visits for anyone 18 and older to request care online for non-urgent symptoms. For details visit [email or web address].   Also download the MyChart app! Go to the app store, search "MyChart", open the app, select [facility], and log in with your MyChart username and password.  Durvalumab Injection What is this medication? DURVALUMAB (dur VAL ue mab) treats some types of cancer. It works by helping your immune system slow or stop the spread of cancer cells. It is a monoclonal antibody. This medicine may be used for other purposes; ask your health care provider or pharmacist if you have questions. COMMON BRAND NAME(S): IMFINZI What should I tell my care team before I take this medication? They need to know if you have any of these conditions: Allogeneic stem cell transplant (uses someone else's stem cells) Autoimmune diseases, such as Crohn disease, ulcerative colitis, lupus History of chest radiation Nervous system problems, such   as Guillain-Barre syndrome, myasthenia gravis Organ transplant An unusual or allergic reaction to durvalumab, other medications, foods, dyes, or preservatives Pregnant or trying to get pregnant Breast-feeding How should I use this medication? This medication is  infused into a vein. It is given by your care team in a hospital or clinic setting. A special MedGuide will be given to you before each treatment. Be sure to read this information carefully each time. Talk to your care team about the use of this medication in children. Special care may be needed. Overdosage: If you think you have taken too much of this medicine contact a poison control center or emergency room at once. NOTE: This medicine is only for you. Do not share this medicine with others. What if I miss a dose? Keep appointments for follow-up doses. It is important not to miss your dose. Call your care team if you are unable to keep an appointment. What may interact with this medication? Interactions have not been studied. This list may not describe all possible interactions. Give your health care provider a list of all the medicines, herbs, non-prescription drugs, or dietary supplements you use. Also tell them if you smoke, drink alcohol, or use illegal drugs. Some items may interact with your medicine. What should I watch for while using this medication? Your condition will be monitored carefully while you are receiving this medication. You may need blood work while taking this medication. This medication may cause serious skin reactions. They can happen weeks to months after starting the medication. Contact your care team right away if you notice fevers or flu-like symptoms with a rash. The rash may be red or purple and then turn into blisters or peeling of the skin. You may also notice a red rash with swelling of the face, lips, or lymph nodes in your neck or under your arms. Tell your care team right away if you have any change in your eyesight. Talk to your care team if you may be pregnant. Serious birth defects can occur if you take this medication during pregnancy and for 3 months after the last dose. You will need a negative pregnancy test before starting this medication. Contraception  is recommended while taking this medication and for 3 months after the last dose. Your care team can help you find the option that works for you. Do not breastfeed while taking this medication and for 3 months after the last dose. What side effects may I notice from receiving this medication? Side effects that you should report to your care team as soon as possible: Allergic reactions--skin rash, itching, hives, swelling of the face, lips, tongue, or throat Dry cough, shortness of breath or trouble breathing Eye pain, redness, irritation, or discharge with blurry or decreased vision Heart muscle inflammation--unusual weakness or fatigue, shortness of breath, chest pain, fast or irregular heartbeat, dizziness, swelling of the ankles, feet, or hands Hormone gland problems--headache, sensitivity to light, unusual weakness or fatigue, dizziness, fast or irregular heartbeat, increased sensitivity to cold or heat, excessive sweating, constipation, hair loss, increased thirst or amount of urine, tremors or shaking, irritability Infusion reactions--chest pain, shortness of breath or trouble breathing, feeling faint or lightheaded Kidney injury (glomerulonephritis)--decrease in the amount of urine, red or dark brown urine, foamy or bubbly urine, swelling of the ankles, hands, or feet Liver injury--right upper belly pain, loss of appetite, nausea, light-colored stool, dark yellow or brown urine, yellowing skin or eyes, unusual weakness or fatigue Pain, tingling, or numbness in the   hands or feet, muscle weakness, change in vision, confusion or trouble speaking, loss of balance or coordination, trouble walking, seizures Rash, fever, and swollen lymph nodes Redness, blistering, peeling, or loosening of the skin, including inside the mouth Sudden or severe stomach pain, bloody diarrhea, fever, nausea, vomiting Side effects that usually do not require medical attention (report these to your care team if they  continue or are bothersome): Bone, joint, or muscle pain Diarrhea Fatigue Loss of appetite Nausea Skin rash This list may not describe all possible side effects. Call your doctor for medical advice about side effects. You may report side effects to FDA at 1-800-FDA-1088. Where should I keep my medication? This medication is given in a hospital or clinic. It will not be stored at home. NOTE: This sheet is a summary. It may not cover all possible information. If you have questions about this medicine, talk to your doctor, pharmacist, or health care provider.  2023 Elsevier/Gold Standard (2022-03-03 00:00:00)   

## 2023-04-10 NOTE — Progress Notes (Signed)
[facility] Telephone:([phone]   Fax:([phone]  OFFICE PROGRESS NOTE  [name], [name], MD [address] [place] [address]  DIAGNOSIS: Stage IIIb (T3, N2, M0) non-small cell lung cancer, squamous cell carcinoma presented with 2 separate right upper lobe lung nodule in addition to mediastinal lymphadenopathy diagnosed in January 2024  PDL1 expression: 1%   PRIOR THERAPY: A course of concurrent chemoradiation with weekly carboplatin for AUC of 2 and paclitaxel 45 Mg/M2. Last dose on 02/05/23. Status post 6 cycles. Taxol changed to abraxane starting from cycle #4 due to reaction to taxol    CURRENT THERAPY: Consolidation immunotherapy with Imfinzi 1500 mg IV every 4 weeks.  First dose expected on 03/12/23.  Status post 1 cycle.  INTERVAL HISTORY: Alan Mendez 82 y.o. male returns to the clinic today for follow-up visit.  The patient is feeling fine today with no concerning complaints except for feeling a lump on the right side of his neck likely from radiation treatment.  He has no palpable mass in that area.  He denied having any chest pain, shortness of breath, cough or hemoptysis.  He has no nausea, vomiting, diarrhea or constipation.  He developed some skin rash on the anterior part of the right side of the chest 2 weeks ago and he was treated with hydrocortisone cream.  He was wondering about the surgical option but with the persistent lymphadenopathy he is not a good surgical candidate.  He tolerated the first cycle of consolidation immunotherapy with Imfinzi fairly well.  He is here for evaluation before starting cycle #2.   MEDICAL HISTORY: Past Medical History:  Diagnosis Date   Acquired solitary kidney    left --  s/p  right nephroureterctomy 01/ 2018   Anxiety    no current problems   Depression    no current problems   Elevated PSA    Enlarged lymph node 11/2022   enlarged right paratracheal lymph node.   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, negative margins)   History of kidney stones    passed stones and also surgery to remove   History of unilateral nephrectomy    01/ 2018  right nephroureterectomy for renal pelvis mass (carcinoma in situ)   HLD (hyperlipidemia)    on crestor   Hyperplasia of prostate without lower urinary tract symptoms (LUTS)    Lung cancer (HCC) 12/07/2022   Nephrolithiasis    bilateral non-obstructive   Pneumonia    x 1 - yrs ago   Pulmonary nodules 11/2022   2 right upper lobe pulmonary nodules   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HCC)    Renal cyst, acquired, right    right kidney removed   Wears glasses     ALLERGIES:  is allergic to paclitaxel and lamisil [terbinafine].  MEDICATIONS:  Current Outpatient Medications  Medication Sig Dispense Refill   cholecalciferol (VITAMIN D3) 25 MCG (1000 UNIT) tablet Take 1,000 Units by mouth daily.     Multiple Vitamins-Minerals (MULTIVITAMIN WITH MINERALS) tablet Take 1 tablet by mouth daily. Centrum     prochlorperazine (COMPAZINE) 10 MG tablet Take 10 mg by mouth every 6 (six) hours as needed for nausea or vomiting. (Patient not taking: Reported on 01/15/2023)  rosuvastatin (CRESTOR) 10 MG tablet TAKE 1 TABLET EACH DAY. (Patient taking differently: Take 20 mg by mouth daily.) 30 tablet 0   silver sulfADIAZINE (SILVADENE) 1 % cream Apply 1 Application topically daily. 50 g 0   tamsulosin (FLOMAX) 0.4 MG CAPS capsule Take 1 capsule (0.4 mg total) by mouth daily. (Patient taking differently: Take 0.4 mg by mouth daily after breakfast.) 30 capsule 5   No current facility-administered medications for this visit.    SURGICAL HISTORY:  Past Surgical History:  Procedure Laterality Date   APPENDECTOMY  child   BRONCHIAL  BIOPSY  12/07/2022   Procedure: BRONCHIAL BIOPSIES;  Surgeon: Josephine Igo, DO;  Location: [facility];  Service: Pulmonary;;   BRONCHIAL BRUSHINGS  12/07/2022   Procedure: BRONCHIAL BRUSHINGS;  Surgeon: Josephine Igo, DO;  Location: [facility];  Service: Pulmonary;;   BRONCHIAL NEEDLE ASPIRATION BIOPSY  12/07/2022   Procedure: BRONCHIAL NEEDLE ASPIRATION BIOPSIES;  Surgeon: Josephine Igo, DO;  Location: [facility];  Service: Pulmonary;;   CATARACT EXTRACTION W/ INTRAOCULAR LENS  IMPLANT, BILATERAL  2017   COLONOSCOPY  08/15/2012   Tubular Adenoma, No high grade dysplasia or malignacy.   CYSTOSCOPY W/ RETROGRADES Right 09/25/2016   Procedure: CYSTOSCOPY, URETHRAL DILITATION  WITH RETROGRADE PYELOGRAM, BRUSH BIOPSIES OF RIGHT RENAL MASS;  Surgeon: Jethro Bolus, MD;  Location: [facility];  Service: Urology;  Laterality: Right;   CYSTOSCOPY W/ RETROGRADES Left 03/16/2017   Procedure: CYSTOSCOPY WITH RETROGRADE PYELOGRAM;  Surgeon: Crist Fat, MD;  Location: [facility];  Service: Urology;  Laterality: Left;   CYSTOSCOPY W/ URETERAL STENT REMOVAL Right 09/25/2016   Procedure: CYSTOSCOPY WITH STENT REMOVAL;  Surgeon: Jethro Bolus, MD;  Location: [facility];  Service: Urology;  Laterality: Right;   CYSTOSCOPY WITH RETROGRADE PYELOGRAM, URETEROSCOPY AND STENT PLACEMENT Right 08/21/2016   Procedure: CYSTOSCOPY WITH RIGHT RETROGRADE PYELOGRAM, RIGHT FLEXIBLE AND RIGID URETEROSCOPY, INSERTION DOUBLE J STENT RIGHT;  Surgeon: Jethro Bolus, MD;  Location: [facility];  Service: Urology;  Laterality: Right;   EXTRACORPOREAL SHOCK WAVE LITHOTRIPSY  2009   KNEE SURGERY Right 1980's   ROBOT ASSITED LAPAROSCOPIC NEPHROURETERECTOMY Right 11/15/2016   Procedure: RIGHT XI ROBOT ASSITED LAPAROSCOPIC NEPHROURETERECTOMY;  Surgeon: Crist Fat, MD;  Location: [facility];  Service: Urology;  Laterality: Right;    TRANSURETHRAL RESECTION OF BLADDER TUMOR WITH MITOMYCIN-C Left 03/16/2017   Procedure: CYSTOSCOPY BIOPSIES OF BLADDER TUMOR WITH FULGURATION;  Surgeon: Crist Fat, MD;  Location: [facility];  Service: Urology;  Laterality: Left;   URETEROSCOPY Right 09/25/2016   Procedure: RIGHT URETEROSCOPY;  Surgeon: Jethro Bolus, MD;  Location: [facility];  Service: Urology;  Laterality: Right;   VIDEO BRONCHOSCOPY WITH ENDOBRONCHIAL ULTRASOUND Right 12/07/2022   Procedure: VIDEO BRONCHOSCOPY WITH ENDOBRONCHIAL ULTRASOUND;  Surgeon: Josephine Igo, DO;  Location: [facility];  Service: Pulmonary;  Laterality: Right;    REVIEW OF SYSTEMS:  Constitutional: positive for fatigue Eyes: negative Ears, nose, mouth, throat, and face: negative Respiratory: negative Cardiovascular: negative Gastrointestinal: negative Genitourinary:negative Integument/breast: negative Hematologic/lymphatic: negative Musculoskeletal:negative Neurological: negative Behavioral/Psych: negative Endocrine: negative Allergic/Immunologic: negative   PHYSICAL EXAMINATION: General appearance: alert, cooperative, and no distress Head: Normocephalic, without obvious abnormality, atraumatic Neck: no adenopathy, no JVD, supple, symmetrical, trachea midline, and thyroid not enlarged, symmetric, no tenderness/mass/nodules Lymph nodes: Cervical, supraclavicular, and axillary nodes normal. Resp: clear to auscultation bilaterally Back: symmetric, no curvature. ROM normal. No CVA tenderness. Cardio: regular rate and rhythm, S1,  S2 normal, no murmur, click, rub or gallop GI: soft, non-tender; bowel sounds normal; no masses,  no organomegaly Extremities: extremities normal, atraumatic, no cyanosis or edema Neurologic: Alert and oriented X 3, normal strength and tone. Normal symmetric reflexes. Normal coordination and gait  ECOG PERFORMANCE STATUS: 1 - Symptomatic but completely ambulatory  Blood  pressure (!) 140/82, pulse 75, temperature 98 F (36.7 C), temperature source Oral, resp. rate 17, height 5\' 5"  (1.651 m), weight 147 lb 6.4 oz (66.9 kg), SpO2 97 %.  LABORATORY DATA: Lab Results  Component Value Date   WBC 4.9 04/10/2023   HGB 14.1 04/10/2023   HCT 40.1 04/10/2023   MCV 88.3 04/10/2023   PLT 197 04/10/2023      Chemistry      Component Value Date/Time   NA 141 03/12/2023 1204   K 3.5 03/12/2023 1204   CL 108 03/12/2023 1204   CO2 25 03/12/2023 1204   BUN 23 03/12/2023 1204   CREATININE 1.09 03/12/2023 1204   CREATININE 0.85 12/10/2014 1550      Component Value Date/Time   CALCIUM 9.2 03/12/2023 1204   ALKPHOS 75 03/12/2023 1204   AST 23 03/12/2023 1204   ALT 20 03/12/2023 1204   BILITOT 0.8 03/12/2023 1204       RADIOGRAPHIC STUDIES: No results found.  ASSESSMENT AND PLAN: This is a very pleasant 81 years old white male with Stage IIIb (T3, N2, M0) non-small cell lung cancer, squamous cell carcinoma presented with 2 separate right upper lobe lung nodule in addition to mediastinal lymphadenopathy diagnosed in January 2024. The patient has no evidence of metastatic disease to the brain or extrathoracic metastasis besides the 2 right upper lobe pulmonary nodule and mediastinal lymphadenopathy. I discussed his case with Dr. Dorris Fetch, cardiothoracic surgery and he indicated that because of the location of the lymph node, the patient will not be a great candidate for surgical resection with negative margin. The patient underwent a course of concurrent chemoradiation with weekly carboplatin for AUC of 2 and paclitaxel 45 Mg/M2.  Status post 6 cycles.  His treatment with paclitaxel was changed to Abraxane starting from cycle #3 with secondary to hypersensitivity reaction. He tolerated this treatment fairly well with partial response. He is currently undergoing consolidation treatment with immunotherapy with Imfinzi 1500 Mg IV every 4 weeks status post 1 cycle.   He tolerated the first cycle of his treatment fairly well.  The patient had skin rash develop on the right anterior part of the chest 2 weeks ago and this is likely related to radiation recall reaction. I recommended for him to proceed with cycle #2 today as planned. The patient had several questions about his condition and answered them completely to his satisfaction including the question about surgical resection which was not an option before starting the treatment and still not an option after his concurrent chemoradiation because of the persistent mediastinal lymphadenopathy.  This was discussed with Dr. Dorris Fetch in the past. I will see the patient back for follow-up visit in 4 weeks for evaluation before starting cycle #3 and he will have repeat imaging studies after the next cycle of his treatment. He was advised to call immediately if he has any other concerning symptoms in the interval.  The patient voices understanding of current disease status and treatment options and is in agreement with the current care plan.  All questions were answered. The patient knows to call the clinic with any problems, questions or concerns. We can certainly see  the patient much sooner if necessary.  The total time spent in the appointment was 20 minutes.  Disclaimer: This note was dictated with voice recognition software. Similar sounding words can inadvertently be transcribed and may not be corrected upon review.

## 2023-04-11 ENCOUNTER — Telehealth: Payer: Self-pay

## 2023-04-11 DIAGNOSIS — Z0000 Encounter for general adult medical examination without abnormal findings: Secondary | ICD-10-CM | POA: Diagnosis not present

## 2023-04-11 NOTE — Telephone Encounter (Signed)
Transition Care Management Unsuccessful Follow-up Telephone Call  Date of discharge and from where:  [facility]   Attempts:  1st Attempt  Reason for unsuccessful TCM follow-up call:  Left voice message   [facility], [facility] [phone] [address] [place], [place], [place] [address] Phone: [phone] Email: [email or web address]

## 2023-04-12 ENCOUNTER — Telehealth: Payer: Self-pay

## 2023-04-12 DIAGNOSIS — E7800 Pure hypercholesterolemia, unspecified: Secondary | ICD-10-CM | POA: Diagnosis not present

## 2023-04-12 DIAGNOSIS — Z125 Encounter for screening for malignant neoplasm of prostate: Secondary | ICD-10-CM | POA: Diagnosis not present

## 2023-04-12 DIAGNOSIS — R7989 Other specified abnormal findings of blood chemistry: Secondary | ICD-10-CM | POA: Diagnosis not present

## 2023-04-12 NOTE — Telephone Encounter (Signed)
Transition Care Management Follow-up Telephone Call Date of discharge and from where: [facility] 5/19 How have you been since you were released from the hospital? Doing good Any questions or concerns? No  Items Reviewed: Did the pt receive and understand the discharge instructions provided? Yes  Medications obtained and verified? Yes  Other? No  Any new allergies since your discharge? No  Dietary orders reviewed? No Do you have support at home? Yes    Follow up appointments reviewed:  PCP Hospital f/u appt confirmed? Yes  Scheduled to see  on  @ . Specialist Hospital f/u appt confirmed? Yes  Scheduled to see  on  @ . Are transportation arrangements needed? No  If their condition worsens, is the pt aware to call PCP or go to the Emergency Dept.? Yes Was the patient provided with contact information for the PCP's office or ED? Yes Was to pt encouraged to call back with questions or concerns? Yes

## 2023-04-13 ENCOUNTER — Encounter: Payer: Self-pay | Admitting: Internal Medicine

## 2023-04-13 ENCOUNTER — Encounter: Payer: Self-pay | Admitting: Medical Oncology

## 2023-04-14 ENCOUNTER — Other Ambulatory Visit: Payer: Self-pay

## 2023-04-18 DIAGNOSIS — M419 Scoliosis, unspecified: Secondary | ICD-10-CM | POA: Diagnosis not present

## 2023-04-18 DIAGNOSIS — N401 Enlarged prostate with lower urinary tract symptoms: Secondary | ICD-10-CM | POA: Diagnosis not present

## 2023-04-18 DIAGNOSIS — C649 Malignant neoplasm of unspecified kidney, except renal pelvis: Secondary | ICD-10-CM | POA: Diagnosis not present

## 2023-04-18 DIAGNOSIS — H9190 Unspecified hearing loss, unspecified ear: Secondary | ICD-10-CM | POA: Diagnosis not present

## 2023-04-18 DIAGNOSIS — Z905 Acquired absence of kidney: Secondary | ICD-10-CM | POA: Diagnosis not present

## 2023-04-18 DIAGNOSIS — R82998 Other abnormal findings in urine: Secondary | ICD-10-CM | POA: Diagnosis not present

## 2023-04-18 DIAGNOSIS — Z0000 Encounter for general adult medical examination without abnormal findings: Secondary | ICD-10-CM | POA: Diagnosis not present

## 2023-04-18 DIAGNOSIS — C3491 Malignant neoplasm of unspecified part of right bronchus or lung: Secondary | ICD-10-CM | POA: Diagnosis not present

## 2023-04-18 DIAGNOSIS — Z1339 Encounter for screening examination for other mental health and behavioral disorders: Secondary | ICD-10-CM | POA: Diagnosis not present

## 2023-04-18 DIAGNOSIS — E7800 Pure hypercholesterolemia, unspecified: Secondary | ICD-10-CM | POA: Diagnosis not present

## 2023-04-18 DIAGNOSIS — R972 Elevated prostate specific antigen [PSA]: Secondary | ICD-10-CM | POA: Diagnosis not present

## 2023-04-18 DIAGNOSIS — C679 Malignant neoplasm of bladder, unspecified: Secondary | ICD-10-CM | POA: Diagnosis not present

## 2023-04-18 DIAGNOSIS — Z1331 Encounter for screening for depression: Secondary | ICD-10-CM | POA: Diagnosis not present

## 2023-04-23 DIAGNOSIS — C678 Malignant neoplasm of overlapping sites of bladder: Secondary | ICD-10-CM | POA: Diagnosis not present

## 2023-04-23 DIAGNOSIS — Z125 Encounter for screening for malignant neoplasm of prostate: Secondary | ICD-10-CM | POA: Diagnosis not present

## 2023-04-30 DIAGNOSIS — N5201 Erectile dysfunction due to arterial insufficiency: Secondary | ICD-10-CM | POA: Diagnosis not present

## 2023-04-30 DIAGNOSIS — N401 Enlarged prostate with lower urinary tract symptoms: Secondary | ICD-10-CM | POA: Diagnosis not present

## 2023-04-30 DIAGNOSIS — R3914 Feeling of incomplete bladder emptying: Secondary | ICD-10-CM | POA: Diagnosis not present

## 2023-04-30 DIAGNOSIS — C678 Malignant neoplasm of overlapping sites of bladder: Secondary | ICD-10-CM | POA: Diagnosis not present

## 2023-05-07 ENCOUNTER — Inpatient Hospital Stay (HOSPITAL_BASED_OUTPATIENT_CLINIC_OR_DEPARTMENT_OTHER): Payer: Medicare Other | Admitting: Internal Medicine

## 2023-05-07 ENCOUNTER — Inpatient Hospital Stay: Payer: Medicare Other

## 2023-05-07 ENCOUNTER — Inpatient Hospital Stay: Payer: Medicare Other | Attending: Internal Medicine

## 2023-05-07 ENCOUNTER — Other Ambulatory Visit: Payer: Self-pay

## 2023-05-07 VITALS — BP 146/84 | HR 77 | Temp 98.4°F | Resp 15 | Ht 65.0 in | Wt 147.0 lb

## 2023-05-07 DIAGNOSIS — Z79620 Long term (current) use of immunosuppressive biologic: Secondary | ICD-10-CM | POA: Insufficient documentation

## 2023-05-07 DIAGNOSIS — C3491 Malignant neoplasm of unspecified part of right bronchus or lung: Secondary | ICD-10-CM

## 2023-05-07 DIAGNOSIS — Z5112 Encounter for antineoplastic immunotherapy: Secondary | ICD-10-CM | POA: Diagnosis not present

## 2023-05-07 DIAGNOSIS — C3490 Malignant neoplasm of unspecified part of unspecified bronchus or lung: Secondary | ICD-10-CM

## 2023-05-07 DIAGNOSIS — C3411 Malignant neoplasm of upper lobe, right bronchus or lung: Secondary | ICD-10-CM | POA: Insufficient documentation

## 2023-05-07 LAB — CBC WITH DIFFERENTIAL (CANCER CENTER ONLY)
Abs Immature Granulocytes: 0.03 10*3/uL (ref 0.00–0.07)
Basophils Absolute: 0 10*3/uL (ref 0.0–0.1)
Basophils Relative: 1 %
Eosinophils Absolute: 0.4 10*3/uL (ref 0.0–0.5)
Eosinophils Relative: 7 %
HCT: 38.9 % — ABNORMAL LOW (ref 39.0–52.0)
Hemoglobin: 13.6 g/dL (ref 13.0–17.0)
Immature Granulocytes: 1 %
Lymphocytes Relative: 10 %
Lymphs Abs: 0.6 10*3/uL — ABNORMAL LOW (ref 0.7–4.0)
MCH: 30.5 pg (ref 26.0–34.0)
MCHC: 35 g/dL (ref 30.0–36.0)
MCV: 87.2 fL (ref 80.0–100.0)
Monocytes Absolute: 0.6 10*3/uL (ref 0.1–1.0)
Monocytes Relative: 10 %
Neutro Abs: 4.1 10*3/uL (ref 1.7–7.7)
Neutrophils Relative %: 71 %
Platelet Count: 225 10*3/uL (ref 150–400)
RBC: 4.46 MIL/uL (ref 4.22–5.81)
RDW: 11.6 % (ref 11.5–15.5)
WBC Count: 5.6 10*3/uL (ref 4.0–10.5)
nRBC: 0 % (ref 0.0–0.2)

## 2023-05-07 LAB — CMP (CANCER CENTER ONLY)
ALT: 16 U/L (ref 0–44)
AST: 23 U/L (ref 15–41)
Albumin: 3.9 g/dL (ref 3.5–5.0)
Alkaline Phosphatase: 67 U/L (ref 38–126)
Anion gap: 7 (ref 5–15)
BUN: 22 mg/dL (ref 8–23)
CO2: 23 mmol/L (ref 22–32)
Calcium: 9.1 mg/dL (ref 8.9–10.3)
Chloride: 108 mmol/L (ref 98–111)
Creatinine: 1.22 mg/dL (ref 0.61–1.24)
GFR, Estimated: >60 mL/min (ref >60)
Glucose, Bld: 107 mg/dL — ABNORMAL HIGH (ref 70–99)
Potassium: 4 mmol/L (ref 3.5–5.1)
Sodium: 138 mmol/L (ref 135–145)
Total Bilirubin: 0.8 mg/dL (ref 0.3–1.2)
Total Protein: 6.9 g/dL (ref 6.5–8.1)

## 2023-05-07 LAB — TSH: TSH: 2.37 u[IU]/mL (ref 0.350–4.500)

## 2023-05-07 MED ORDER — SODIUM CHLORIDE 0.9 % IV SOLN
1500.0000 mg | Freq: Once | INTRAVENOUS | Status: AC
  Administered 2023-05-07: 1500 mg via INTRAVENOUS
  Filled 2023-05-07: qty 30

## 2023-05-07 MED ORDER — SODIUM CHLORIDE 0.9 % IV SOLN: Freq: Once | INTRAVENOUS | Status: AC

## 2023-05-07 NOTE — Progress Notes (Signed)
[facility] Telephone:([phone]   Fax:(336) [phone]  OFFICE PROGRESS NOTE  [name], [name], MD [address] [place] [address]  DIAGNOSIS: Stage IIIb (T3, N2, M0) non-small cell lung cancer, squamous cell carcinoma presented with 2 separate right upper lobe lung nodule in addition to mediastinal lymphadenopathy diagnosed in January 2024  PDL1 expression: 1%   PRIOR THERAPY: A course of concurrent chemoradiation with weekly carboplatin for AUC of 2 and paclitaxel 45 Mg/M2. Last dose on 02/05/23. Status post 6 cycles. Taxol changed to abraxane starting from cycle #4 due to reaction to taxol    CURRENT THERAPY: Consolidation immunotherapy with Imfinzi 1500 mg IV every 4 weeks.  First dose expected on 03/12/23.  Status post 2 cycles.  INTERVAL HISTORY: Alan Mendez 82 y.o. male returns to the clinic today for follow-up visit.  The patient is feeling fine today with no concerning complaints except for some soreness in the neck area likely from radiation treatment.  He has no palpable masses in that area.  He denied having any current chest pain, shortness of breath, cough or hemoptysis.  He has no nausea, vomiting, diarrhea or constipation.  He has no headache or visual changes.  He has no recent weight loss or night sweats.  He has been tolerating his treatment with Imfinzi fairly well.  He is here for evaluation before starting cycle #3.  MEDICAL HISTORY: Past Medical History:  Diagnosis Date   Acquired solitary kidney    left --  s/p  right nephroureterctomy 01/ 2018   Anxiety    no current problems   Depression    no current problems   Elevated PSA    Enlarged lymph node 11/2022   enlarged right paratracheal lymph node.   History of adenomatous polyp of colon    tubular adenoma 2013   History of kidney cancer urologist-  dr Marlou Porch   dx 11/ 2017--  11-15-2016  s/p  right nephoureterectomy (per path report-- low grade papillay urothelial carcinoma  in situ involving renal pelvis, negative margins)   History of kidney stones    passed stones and also surgery to remove   History of unilateral nephrectomy    01/ 2018  right nephroureterectomy for renal pelvis mass (carcinoma in situ)   HLD (hyperlipidemia)    on crestor   Hyperplasia of prostate without lower urinary tract symptoms (LUTS)    Lung cancer (HCC) 12/07/2022   Nephrolithiasis    bilateral non-obstructive   Pneumonia    x 1 - yrs ago   Pulmonary nodules 11/2022   2 right upper lobe pulmonary nodules   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HCC)    Renal cyst, acquired, right    right kidney removed   Wears glasses     ALLERGIES:  is allergic to paclitaxel and lamisil [terbinafine].  MEDICATIONS:  Current Outpatient Medications  Medication Sig Dispense Refill   cholecalciferol (VITAMIN D3) 25 MCG (1000 UNIT) tablet Take 1,000 Units by mouth daily.     Multiple Vitamins-Minerals (MULTIVITAMIN WITH MINERALS) tablet Take 1 tablet by mouth daily. Centrum     prochlorperazine (COMPAZINE) 10 MG tablet Take 10 mg by mouth every 6 (six) hours as needed for nausea or vomiting. (Patient not taking: Reported on 01/15/2023)     rosuvastatin (CRESTOR) 10 MG tablet TAKE 1 TABLET EACH DAY. (Patient taking differently: Take 20 mg by mouth daily.) 30 tablet 0   silver sulfADIAZINE (SILVADENE) 1 % cream Apply 1  Application topically daily. 50 g 0   tamsulosin (FLOMAX) 0.4 MG CAPS capsule Take 1 capsule (0.4 mg total) by mouth daily. (Patient taking differently: Take 0.4 mg by mouth daily after breakfast.) 30 capsule 5   No current facility-administered medications for this visit.    SURGICAL HISTORY:  Past Surgical History:  Procedure Laterality Date   APPENDECTOMY  child   BRONCHIAL BIOPSY  12/07/2022   Procedure: BRONCHIAL BIOPSIES;  Surgeon: Josephine Igo, DO;  Location: [facility];  Service: Pulmonary;;   BRONCHIAL BRUSHINGS  12/07/2022   Procedure: BRONCHIAL BRUSHINGS;   Surgeon: Josephine Igo, DO;  Location: [facility];  Service: Pulmonary;;   BRONCHIAL NEEDLE ASPIRATION BIOPSY  12/07/2022   Procedure: BRONCHIAL NEEDLE ASPIRATION BIOPSIES;  Surgeon: Josephine Igo, DO;  Location: [facility];  Service: Pulmonary;;   CATARACT EXTRACTION W/ INTRAOCULAR LENS  IMPLANT, BILATERAL  2017   COLONOSCOPY  08/15/2012   Tubular Adenoma, No high grade dysplasia or malignacy.   CYSTOSCOPY W/ RETROGRADES Right 09/25/2016   Procedure: CYSTOSCOPY, URETHRAL DILITATION  WITH RETROGRADE PYELOGRAM, BRUSH BIOPSIES OF RIGHT RENAL MASS;  Surgeon: Jethro Bolus, MD;  [facility];  Service: Urology;  Laterality: Right;   CYSTOSCOPY W/ RETROGRADES Left 03/16/2017   Procedure: CYSTOSCOPY WITH RETROGRADE PYELOGRAM;  Surgeon: Crist Fat, MD;  Location: [facility];  Service: Urology;  Laterality: Left;   CYSTOSCOPY W/ URETERAL STENT REMOVAL Right 09/25/2016   Procedure: CYSTOSCOPY WITH STENT REMOVAL;  Surgeon: Jethro Bolus, MD;  Location: [facility];  Service: Urology;  Laterality: Right;   CYSTOSCOPY WITH RETROGRADE PYELOGRAM, URETEROSCOPY AND STENT PLACEMENT Right 08/21/2016   Procedure: CYSTOSCOPY WITH RIGHT RETROGRADE PYELOGRAM, RIGHT FLEXIBLE AND RIGID URETEROSCOPY, INSERTION DOUBLE J STENT RIGHT;  Surgeon: Jethro Bolus, MD;  Location: [facility];  Service: Urology;  Laterality: Right;   EXTRACORPOREAL SHOCK WAVE LITHOTRIPSY  2009   KNEE SURGERY Right 1980's   ROBOT ASSITED LAPAROSCOPIC NEPHROURETERECTOMY Right 11/15/2016   Procedure: RIGHT XI ROBOT ASSITED LAPAROSCOPIC NEPHROURETERECTOMY;  Surgeon: Crist Fat, MD;  Location: [facility];  Service: Urology;  Laterality: Right;   TRANSURETHRAL RESECTION OF BLADDER TUMOR WITH MITOMYCIN-C Left 03/16/2017   Procedure: CYSTOSCOPY BIOPSIES OF BLADDER TUMOR WITH FULGURATION;  Surgeon: Crist Fat, MD;  Location: [facility];  Service: Urology;  Laterality: Left;   URETEROSCOPY Right 09/25/2016   Procedure: RIGHT URETEROSCOPY;  Surgeon: Jethro Bolus, MD;  Location: [facility];  Service: Urology;  Laterality: Right;   VIDEO BRONCHOSCOPY WITH ENDOBRONCHIAL ULTRASOUND Right 12/07/2022   Procedure: VIDEO BRONCHOSCOPY WITH ENDOBRONCHIAL ULTRASOUND;  Surgeon: Josephine Igo, DO;  Location: [facility];  Service: Pulmonary;  Laterality: Right;    REVIEW OF SYSTEMS:  A comprehensive review of systems was negative except for: Ears, nose, mouth, throat, and face: positive for sore throat   PHYSICAL EXAMINATION: General appearance: alert, cooperative, and no distress Head: Normocephalic, without obvious abnormality, atraumatic Neck: no adenopathy, no JVD, supple, symmetrical, trachea midline, and thyroid not enlarged, symmetric, no tenderness/mass/nodules Lymph nodes: Cervical, supraclavicular, and axillary nodes normal. Resp: clear to auscultation bilaterally Back: symmetric, no curvature. ROM normal. No CVA tenderness. Cardio: regular rate and rhythm, S1, S2 normal, no murmur, click, rub or gallop GI: soft, non-tender; bowel sounds normal; no masses,  no organomegaly Extremities: extremities normal, atraumatic, no cyanosis or edema  ECOG PERFORMANCE STATUS: 1 - Symptomatic but completely ambulatory  Blood pressure (!) 146/84, pulse 77, temperature 98.4  F (36.9 C), temperature source Oral, resp. rate 15, height 5\' 5"  (1.651 m), weight 147 lb (66.7 kg), SpO2 97 %.  LABORATORY DATA: Lab Results  Component Value Date   WBC 5.6 05/07/2023   HGB 13.6 05/07/2023   HCT 38.9 (L) 05/07/2023   MCV 87.2 05/07/2023   PLT 225 05/07/2023      Chemistry      Component Value Date/Time   NA 141 04/10/2023 0812   K 4.4 04/10/2023 0812   CL 108 04/10/2023 0812   CO2 25 04/10/2023 0812   BUN 20 04/10/2023 0812   CREATININE 1.22 04/10/2023 0812   CREATININE 0.85 12/10/2014 1550       Component Value Date/Time   CALCIUM 9.1 04/10/2023 0812   ALKPHOS 66 04/10/2023 0812   AST 21 04/10/2023 0812   ALT 13 04/10/2023 0812   BILITOT 0.9 04/10/2023 0812       RADIOGRAPHIC STUDIES: No results found.  ASSESSMENT AND PLAN: This is a very pleasant 82 years old white male with Stage IIIb (T3, N2, M0) non-small cell lung cancer, squamous cell carcinoma presented with 2 separate right upper lobe lung nodule in addition to mediastinal lymphadenopathy diagnosed in January 2024. The patient has no evidence of metastatic disease to the brain or extrathoracic metastasis besides the 2 right upper lobe pulmonary nodule and mediastinal lymphadenopathy. I discussed his case with Dr. Dorris Fetch, cardiothoracic surgery and he indicated that because of the location of the lymph node, the patient will not be a great candidate for surgical resection with negative margin. The patient underwent a course of concurrent chemoradiation with weekly carboplatin for AUC of 2 and paclitaxel 45 Mg/M2.  Status post 6 cycles.  His treatment with paclitaxel was changed to Abraxane starting from cycle #3 with secondary to hypersensitivity reaction. He tolerated this treatment fairly well with partial response. He is currently undergoing consolidation treatment with immunotherapy with Imfinzi 1500 Mg IV every 4 weeks status post 2 cycles.   The patient has been tolerating this treatment fairly well with no concerning adverse effects. I recommended for him to proceed with cycle #3 today as planned. I will see him back for follow-up visit in 4 weeks for evaluation before starting cycle #4 with repeat CT scan of the chest and I will also add CT scan of the neck for evaluation of the soreness in his throat. He was advised to call immediately if he has any other concerning symptoms in the interval. The patient voices understanding of current disease status and treatment options and is in agreement with the current care  plan.  All questions were answered. The patient knows to call the clinic with any problems, questions or concerns. We can certainly see the patient much sooner if necessary.  The total time spent in the appointment was 20 minutes.  Disclaimer: This note was dictated with voice recognition software. Similar sounding words can inadvertently be transcribed and may not be corrected upon review.

## 2023-05-07 NOTE — Patient Instructions (Signed)
[facility]  Discharge Instructions: Thank you for choosing [facility] to provide your oncology and hematology care.   If you have a lab appointment with the Cancer Center, please go directly to the Cancer Center and check in at the registration area.   Wear comfortable clothing and clothing appropriate for easy access to any Portacath or PICC line.   We strive to give you quality time with your provider. You may need to reschedule your appointment if you arrive late (15 or more minutes).  Arriving late affects you and other patients whose appointments are after yours.  Also, if you miss three or more appointments without notifying the office, you may be dismissed from the clinic at the provider's discretion.      For prescription refill requests, have your pharmacy contact our office and allow 72 hours for refills to be completed.    Today you received the following chemotherapy and/or immunotherapy agents imfinzi      To help prevent nausea and vomiting after your treatment, we encourage you to take your nausea medication as directed.  BELOW ARE SYMPTOMS THAT SHOULD BE REPORTED IMMEDIATELY: *FEVER GREATER THAN 100.4 F (38 C) OR HIGHER *CHILLS OR SWEATING *NAUSEA AND VOMITING THAT IS NOT CONTROLLED WITH YOUR NAUSEA MEDICATION *UNUSUAL SHORTNESS OF BREATH *UNUSUAL BRUISING OR BLEEDING *URINARY PROBLEMS (pain or burning when urinating, or frequent urination) *BOWEL PROBLEMS (unusual diarrhea, constipation, pain near the anus) TENDERNESS IN MOUTH AND THROAT WITH OR WITHOUT PRESENCE OF ULCERS (sore throat, sores in mouth, or a toothache) UNUSUAL RASH, SWELLING OR PAIN  UNUSUAL VAGINAL DISCHARGE OR ITCHING   Items with * indicate a potential emergency and should be followed up as soon as possible or go to the Emergency Department if any problems should occur.  Please show the CHEMOTHERAPY ALERT CARD or IMMUNOTHERAPY ALERT CARD at check-in  to the Emergency Department and triage nurse.  Should you have questions after your visit or need to cancel or reschedule your appointment, please contact [facility]  Dept: [phone]  and follow the prompts.  Office hours are 8:00 a.m. to 4:30 p.m. Monday - Friday. Please note that voicemails left after 4:00 p.m. may not be returned until the following business day.  We are closed weekends and major holidays. You have access to a nurse at all times for urgent questions. Please call the main number to the clinic Dept: [phone] and follow the prompts.   For any non-urgent questions, you may also contact your provider using MyChart. We now offer e-Visits for anyone 18 and older to request care online for non-urgent symptoms. For details visit [email or web address].   Also download the MyChart app! Go to the app store, search "MyChart", open the app, select [facility], and log in with your MyChart username and password.   

## 2023-05-08 ENCOUNTER — Other Ambulatory Visit: Payer: Self-pay

## 2023-05-08 ENCOUNTER — Telehealth: Payer: Self-pay | Admitting: Internal Medicine

## 2023-05-08 DIAGNOSIS — M25571 Pain in right ankle and joints of right foot: Secondary | ICD-10-CM | POA: Diagnosis not present

## 2023-05-08 DIAGNOSIS — S8261XA Displaced fracture of lateral malleolus of right fibula, initial encounter for closed fracture: Secondary | ICD-10-CM | POA: Diagnosis not present

## 2023-05-08 LAB — T4: T4, Total: 8.5 ug/dL (ref 4.5–12.0)

## 2023-05-08 NOTE — Telephone Encounter (Signed)
Called patient regarding July and August appointments, patient is notified.  

## 2023-05-20 ENCOUNTER — Other Ambulatory Visit: Payer: Self-pay

## 2023-05-29 ENCOUNTER — Ambulatory Visit (HOSPITAL_COMMUNITY)
Admission: RE | Admit: 2023-05-29 | Discharge: 2023-05-29 | Disposition: A | Discharge status: Home / Self Care | Payer: Medicare Other | Source: Ambulatory Visit | Attending: Internal Medicine | Admitting: Internal Medicine

## 2023-05-29 ENCOUNTER — Ambulatory Visit (HOSPITAL_COMMUNITY): Payer: Medicare Other

## 2023-05-29 DIAGNOSIS — R590 Localized enlarged lymph nodes: Secondary | ICD-10-CM | POA: Diagnosis not present

## 2023-05-29 DIAGNOSIS — C3491 Malignant neoplasm of unspecified part of right bronchus or lung: Secondary | ICD-10-CM | POA: Diagnosis not present

## 2023-05-29 DIAGNOSIS — C3490 Malignant neoplasm of unspecified part of unspecified bronchus or lung: Secondary | ICD-10-CM | POA: Diagnosis not present

## 2023-05-29 DIAGNOSIS — E041 Nontoxic single thyroid nodule: Secondary | ICD-10-CM | POA: Diagnosis not present

## 2023-05-29 DIAGNOSIS — I700 Atherosclerosis of aorta: Secondary | ICD-10-CM | POA: Diagnosis not present

## 2023-05-29 DIAGNOSIS — M47812 Spondylosis without myelopathy or radiculopathy, cervical region: Secondary | ICD-10-CM | POA: Diagnosis not present

## 2023-05-29 MED ORDER — IOHEXOL 300 MG/ML  SOLN
100.0000 mL | Freq: Once | INTRAMUSCULAR | Status: AC | PRN
  Administered 2023-05-29: 100 mL via INTRAVENOUS

## 2023-05-29 MED ORDER — SODIUM CHLORIDE (PF) 0.9 % IJ SOLN
INTRAMUSCULAR | Status: AC
  Filled 2023-05-29: qty 50

## 2023-05-30 ENCOUNTER — Ambulatory Visit: Payer: Medicare Other

## 2023-05-30 ENCOUNTER — Telehealth: Payer: Self-pay

## 2023-05-30 NOTE — Telephone Encounter (Signed)
Pt called to report rash to lower extremities since last night after having IV dye for scan yesterday. He wants to know if he can take medication to help w/ redness and itching. Pt states this type of reaction has happened in the past when he has had IV contrast. Recommended taking Benadryl prior to any procedures involving IV contrast dye in the future and informed that he can take Benadryl (as directed on label) now to alleviate s/s of rash. Also advised to try Benadryl cream or Hydrocortisone cream (OTC). He denies oral swelling and any difficulty w/ breathing. To call back if s/s are not resolved w/ recommended treatment. IV contrast dye added to allergy list.

## 2023-05-31 DIAGNOSIS — H903 Sensorineural hearing loss, bilateral: Secondary | ICD-10-CM | POA: Diagnosis not present

## 2023-06-04 ENCOUNTER — Inpatient Hospital Stay: Payer: Medicare Other

## 2023-06-04 ENCOUNTER — Inpatient Hospital Stay (HOSPITAL_BASED_OUTPATIENT_CLINIC_OR_DEPARTMENT_OTHER): Payer: Medicare Other | Admitting: Internal Medicine

## 2023-06-04 ENCOUNTER — Inpatient Hospital Stay: Payer: Medicare Other | Attending: Internal Medicine

## 2023-06-04 ENCOUNTER — Other Ambulatory Visit: Payer: Self-pay

## 2023-06-04 ENCOUNTER — Other Ambulatory Visit: Payer: Self-pay | Admitting: Medical Oncology

## 2023-06-04 DIAGNOSIS — C3491 Malignant neoplasm of unspecified part of right bronchus or lung: Secondary | ICD-10-CM

## 2023-06-04 DIAGNOSIS — Z5112 Encounter for antineoplastic immunotherapy: Secondary | ICD-10-CM | POA: Diagnosis not present

## 2023-06-04 DIAGNOSIS — E041 Nontoxic single thyroid nodule: Secondary | ICD-10-CM | POA: Diagnosis not present

## 2023-06-04 DIAGNOSIS — C3411 Malignant neoplasm of upper lobe, right bronchus or lung: Secondary | ICD-10-CM | POA: Insufficient documentation

## 2023-06-04 DIAGNOSIS — I878 Other specified disorders of veins: Secondary | ICD-10-CM

## 2023-06-04 LAB — CBC WITH DIFFERENTIAL (CANCER CENTER ONLY)
Abs Immature Granulocytes: 0.03 10*3/uL (ref 0.00–0.07)
Basophils Absolute: 0 10*3/uL (ref 0.0–0.1)
Basophils Relative: 1 %
Eosinophils Absolute: 0.3 10*3/uL (ref 0.0–0.5)
Eosinophils Relative: 5 %
HCT: 43 % (ref 39.0–52.0)
Hemoglobin: 15 g/dL (ref 13.0–17.0)
Immature Granulocytes: 1 %
Lymphocytes Relative: 10 %
Lymphs Abs: 0.7 10*3/uL (ref 0.7–4.0)
MCH: 29.8 pg (ref 26.0–34.0)
MCHC: 34.9 g/dL (ref 30.0–36.0)
MCV: 85.5 fL (ref 80.0–100.0)
Monocytes Absolute: 0.7 10*3/uL (ref 0.1–1.0)
Monocytes Relative: 11 %
Neutro Abs: 4.8 10*3/uL (ref 1.7–7.7)
Neutrophils Relative %: 72 %
Platelet Count: 226 10*3/uL (ref 150–400)
RBC: 5.03 MIL/uL (ref 4.22–5.81)
RDW: 11.7 % (ref 11.5–15.5)
WBC Count: 6.5 10*3/uL (ref 4.0–10.5)
nRBC: 0 % (ref 0.0–0.2)

## 2023-06-04 LAB — CMP (CANCER CENTER ONLY)
ALT: 15 U/L (ref 0–44)
AST: 21 U/L (ref 15–41)
Albumin: 4 g/dL (ref 3.5–5.0)
Alkaline Phosphatase: 81 U/L (ref 38–126)
Anion gap: 7 (ref 5–15)
BUN: 22 mg/dL (ref 8–23)
CO2: 24 mmol/L (ref 22–32)
Calcium: 9.2 mg/dL (ref 8.9–10.3)
Chloride: 108 mmol/L (ref 98–111)
Creatinine: 1.13 mg/dL (ref 0.61–1.24)
GFR, Estimated: >60 mL/min (ref >60)
Glucose, Bld: 91 mg/dL (ref 70–99)
Potassium: 4.1 mmol/L (ref 3.5–5.1)
Sodium: 139 mmol/L (ref 135–145)
Total Bilirubin: 0.6 mg/dL (ref 0.3–1.2)
Total Protein: 7 g/dL (ref 6.5–8.1)

## 2023-06-04 MED ORDER — SODIUM CHLORIDE 0.9 % IV SOLN
1500.0000 mg | Freq: Once | INTRAVENOUS | Status: AC
  Administered 2023-06-04: 1500 mg via INTRAVENOUS
  Filled 2023-06-04: qty 30

## 2023-06-04 MED ORDER — SODIUM CHLORIDE 0.9 % IV SOLN: Freq: Once | INTRAVENOUS | Status: AC

## 2023-06-04 NOTE — Progress Notes (Signed)
Pt requests port a path due to difficult PIV.

## 2023-06-04 NOTE — Progress Notes (Signed)
[facility] [phone]   [phone]  OFFICE PROGRESS NOTE  [name], [name], MD [address] [place] [address]  DIAGNOSIS: Stage IIIb (T3, N2, M0) non-small cell lung cancer, squamous cell carcinoma presented with 2 separate right upper lobe lung nodule in addition to mediastinal lymphadenopathy diagnosed in January 2024  PDL1 expression: 1%   PRIOR THERAPY: A course of concurrent chemoradiation with weekly carboplatin for AUC of 2 and paclitaxel 45 Mg/M2. Last dose on 02/05/23. Status post 6 cycles. Taxol changed to abraxane starting from cycle #4 due to reaction to taxol    CURRENT THERAPY: Consolidation immunotherapy with Imfinzi 1500 mg IV every 4 weeks.  First dose expected on 03/12/23.  Status post 3 cycles.  INTERVAL HISTORY: [facility] 82 y.o. male returns to the clinic today for follow-up visit accompanied by his wife.  The patient is feeling fine today with no concerning complaints.  He had some rash after the IV dye for the scan recently.  He denied having any current chest pain, shortness of breath, cough or hemoptysis.  He has no nausea, vomiting, diarrhea or constipation.  He has no headache or visual changes.  He denied having any significant weight loss or night sweats.  He has been tolerating his treatment with immunotherapy fairly well.  He had repeat CT scan of the neck and chest performed recently and he is here for evaluation and discussion of his scan results.  MEDICAL HISTORY: Past Medical History:  Diagnosis Date   Acquired solitary kidney    left --  s/p  right nephroureterctomy 01/ 2018   Anxiety    no current problems   Depression    no current problems   Elevated PSA    Enlarged lymph node 11/2022   enlarged right paratracheal lymph node.   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, negative margins)   History of kidney stones    passed stones and also surgery to remove   History of unilateral nephrectomy    01/ 2018  right nephroureterectomy for renal pelvis mass (carcinoma in situ)   HLD (hyperlipidemia)    on crestor   Hyperplasia of prostate without lower urinary tract symptoms (LUTS)    Lung cancer (HCC) 12/07/2022   Nephrolithiasis    bilateral non-obstructive   Pneumonia    x 1 - yrs ago   Pulmonary nodules 11/2022   2 right upper lobe pulmonary nodules   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HCC)    Renal cyst, acquired, right    right kidney removed   Wears glasses     ALLERGIES:  is allergic to paclitaxel, lamisil [terbinafine], and ivp dye [iodinated contrast media].  MEDICATIONS:  Current Outpatient Medications  Medication Sig Dispense Refill   cholecalciferol (VITAMIN D3) 25 MCG (1000 UNIT) tablet Take 1,000 Units by mouth daily.     Multiple Vitamins-Minerals (MULTIVITAMIN WITH MINERALS) tablet Take 1 tablet by mouth daily. Centrum     prochlorperazine (COMPAZINE) 10 MG tablet Take 10 mg by mouth every 6 (six) hours as needed for nausea or vomiting. (Patient not taking: Reported on 01/15/2023)     rosuvastatin (CRESTOR) 10 MG tablet TAKE 1 TABLET EACH DAY. (Patient taking differently: Take 20  mg by mouth daily.) 30 tablet 0   silver sulfADIAZINE (SILVADENE) 1 % cream Apply 1 Application topically daily. 50 g 0   tamsulosin (FLOMAX) 0.4 MG CAPS capsule Take 1 capsule (0.4 mg total) by mouth daily. (Patient taking differently: Take 0.4 mg by mouth daily after breakfast.) 30 capsule 5   No current facility-administered medications for this visit.    SURGICAL HISTORY:  Past Surgical History:  Procedure Laterality Date   APPENDECTOMY  child   BRONCHIAL BIOPSY  12/07/2022   Procedure: BRONCHIAL BIOPSIES;  Surgeon: Josephine Igo, DO;   Location: [facility];  Service: Pulmonary;;   BRONCHIAL BRUSHINGS  12/07/2022   Procedure: BRONCHIAL BRUSHINGS;  Surgeon: Josephine Igo, DO;  Location: [facility];  Service: Pulmonary;;   BRONCHIAL NEEDLE ASPIRATION BIOPSY  12/07/2022   Procedure: BRONCHIAL NEEDLE ASPIRATION BIOPSIES;  Surgeon: Josephine Igo, DO;  Location: [facility];  Service: Pulmonary;;   CATARACT EXTRACTION W/ INTRAOCULAR LENS  IMPLANT, BILATERAL  2017   COLONOSCOPY  08/15/2012   Tubular Adenoma, No high grade dysplasia or malignacy.   CYSTOSCOPY W/ RETROGRADES Right 09/25/2016   Procedure: CYSTOSCOPY, URETHRAL DILITATION  WITH RETROGRADE PYELOGRAM, BRUSH BIOPSIES OF RIGHT RENAL MASS;  Surgeon: Jethro Bolus, MD;  Location: [facility];  Service: Urology;  Laterality: Right;   CYSTOSCOPY W/ RETROGRADES Left 03/16/2017   Procedure: CYSTOSCOPY WITH RETROGRADE PYELOGRAM;  Surgeon: Crist Fat, MD;  Location[facility];  Service: Urology;  Laterality: Left;   CYSTOSCOPY W/ URETERAL STENT REMOVAL Right 09/25/2016   Procedure: CYSTOSCOPY WITH STENT REMOVAL;  Surgeon: Jethro Bolus, MD;  Location: [facility];  Service: Urology;  Laterality: Right;   CYSTOSCOPY WITH RETROGRADE PYELOGRAM, URETEROSCOPY AND STENT PLACEMENT Right 08/21/2016   Procedure: CYSTOSCOPY WITH RIGHT RETROGRADE PYELOGRAM, RIGHT FLEXIBLE AND RIGID URETEROSCOPY, INSERTION DOUBLE J STENT RIGHT;  Surgeon: Jethro Bolus, MD;  Location: [facility];  Service: Urology;  Laterality: Right;   EXTRACORPOREAL SHOCK WAVE LITHOTRIPSY  2009   KNEE SURGERY Right 1980's   ROBOT ASSITED LAPAROSCOPIC NEPHROURETERECTOMY Right 11/15/2016   Procedure: RIGHT XI ROBOT ASSITED LAPAROSCOPIC NEPHROURETERECTOMY;  Surgeon: Crist Fat, MD;  Location: [facility];  Service: Urology;  Laterality: Right;   TRANSURETHRAL RESECTION OF BLADDER TUMOR WITH MITOMYCIN-C Left 03/16/2017   Procedure: CYSTOSCOPY  BIOPSIES OF BLADDER TUMOR WITH FULGURATION;  Surgeon: Crist Fat, MD;  Location: [facility];  Service: Urology;  Laterality: Left;   URETEROSCOPY Right 09/25/2016   Procedure: RIGHT URETEROSCOPY;  Surgeon: Jethro Bolus, MD;  Location: [facility];  Service: Urology;  Laterality: Right;   VIDEO BRONCHOSCOPY WITH ENDOBRONCHIAL ULTRASOUND Right 12/07/2022   Procedure: VIDEO BRONCHOSCOPY WITH ENDOBRONCHIAL ULTRASOUND;  Surgeon: Josephine Igo, DO;  Location: [facility];  Service: Pulmonary;  Laterality: Right;    REVIEW OF SYSTEMS:  Constitutional: negative Eyes: negative Ears, nose, mouth, throat, and face: negative Respiratory: negative Cardiovascular: negative Gastrointestinal: negative Genitourinary:negative Integument/breast: negative Hematologic/lymphatic: negative Musculoskeletal:negative Neurological: negative Behavioral/Psych: negative Endocrine: negative Allergic/Immunologic: negative   PHYSICAL EXAMINATION: General appearance: alert, cooperative, and no distress Head: Normocephalic, without obvious abnormality, atraumatic Neck: no adenopathy, no JVD, supple, symmetrical, trachea midline, and thyroid not enlarged, symmetric, no tenderness/mass/nodules Lymph nodes: Cervical, supraclavicular, and axillary nodes normal. Resp: clear to auscultation bilaterally Back: symmetric, no curvature. ROM normal. No CVA tenderness. Cardio: regular rate and rhythm, S1, S2 normal, no murmur, click, rub or gallop GI: soft, non-tender; bowel sounds normal; no masses,  no organomegaly Extremities: extremities normal, atraumatic, no cyanosis or edema Neurologic: Alert and oriented X 3, normal strength and tone. Normal symmetric reflexes. Normal coordination and gait  ECOG PERFORMANCE STATUS: 1 - Symptomatic but completely ambulatory  Blood pressure 122/78, pulse 72, temperature 98.4 F (36.9 C), temperature source Oral, resp. rate 17, SpO2  98%.  LABORATORY DATA: Lab Results  Component Value Date   WBC 6.5 06/04/2023   HGB 15.0 06/04/2023   HCT 43.0 06/04/2023   MCV 85.5 06/04/2023   PLT 226 06/04/2023      Chemistry      Component Value Date/Time   NA 138 05/07/2023 0943   K 4.0 05/07/2023 0943   CL 108 05/07/2023 0943   CO2 23 05/07/2023 0943   BUN 22 05/07/2023 0943   CREATININE 1.22 05/07/2023 0943   CREATININE 0.85 12/10/2014 1550      Component Value Date/Time   CALCIUM 9.1 05/07/2023 0943   ALKPHOS 67 05/07/2023 0943   AST 23 05/07/2023 0943   ALT 16 05/07/2023 0943   BILITOT 0.8 05/07/2023 0943       RADIOGRAPHIC STUDIES: CT Soft Tissue Neck W Contrast  Result Date: 06/01/2023 CLINICAL DATA:  Provided history: Primary squamous cell carcinoma of right lung. Neck mass, nonpulsatile. Additional history provided: The patient reports that his neck feels "scratchy" when eating. Prior radiation in March of 2024. EXAM: CT NECK WITH CONTRAST TECHNIQUE: Multidetector CT imaging of the neck was performed using the standard protocol following the bolus administration of intravenous contrast. RADIATION DOSE REDUCTION: This exam was performed according to the departmental dose-optimization program which includes automated exposure control, adjustment of the mA and/or kV according to patient size and/or use of iterative reconstruction technique. CONTRAST:  OMNIPAQUE IOHEXOL 300 MG/ML  SOLN COMPARISON:  PET CT 12/14/2022.  Brain MRI 12/12/2022. FINDINGS: Pharynx and larynx: No appreciable swelling or mass within the oral cavity, pharynx or larynx. Salivary glands: No inflammation, mass, or stone. Thyroid: Asymmetric prominence of the left thyroid lobe. An underlying left thyroid lobe nodule was better appreciated on the prior PET-CT of 12/14/2022 (hypermetabolic on this prior exam). Lymph nodes: No pathologically enlarged lymph nodes are identified within the neck. Vascular: The major vascular structures of the neck  are patent. Atherosclerotic plaque within the visualized aortic arch, proximal major branch vessels of the neck and carotid arteries. Limited intracranial: No evidence of an intracranial mass or acute intracranial abnormality within the field of view Visualized orbits: No orbital mass or acute orbital finding. Mastoids and visualized paranasal sinuses: No significant paranasal sinus disease or mastoid effusion. Skeleton: Cervical spondylosis. C2-C3 grade 1 anterolisthesis. No acute fracture or aggressive osseous lesion. Upper chest: Separately reported on same day chest CT. IMPRESSION: 1. No appreciable swelling or mass within the oral cavity, pharynx or larynx. 2. No pathologically enlarged lymph nodes within the neck. 3. Asymmetric prominence of the left thyroid lobe. An underlying left thyroid lobe nodule was better appreciated on the prior PET CT of 12/14/2022 (hypermetabolic on this prior exam). Thyroid ultrasound and biopsy again recommended (if not already performed)(Reference: J Am Coll Radiol. 2015 Feb;12(2): 143-50). 4. Aortic Atherosclerosis (ICD10-I70.0). 5. Same day chest CT separately reported. Electronically Signed   By: Jackey Loge D.O.   On: 06/01/2023 11:06   CT Chest W Contrast  Result Date: 06/01/2023 CLINICAL DATA:  Non-small cell lung cancer restaging. Status post chemotherapy and XRT. * Tracking Code: BO * EXAM: CT CHEST WITH CONTRAST TECHNIQUE: Multidetector CT imaging of the  chest was performed during intravenous contrast administration. RADIATION DOSE REDUCTION: This exam was performed according to the departmental dose-optimization program which includes automated exposure control, adjustment of the mA and/or kV according to patient size and/or use of iterative reconstruction technique. CONTRAST:  OMNIPAQUE IOHEXOL 300 MG/ML  SOLN COMPARISON:  CT chest 03/01/2023 FINDINGS: Cardiovascular: Heart size appears within normal limits. No pericardial effusion identified. Aortic  atherosclerosis. Mediastinum/Nodes: Previous tracer avid nodule in the left lobe of thyroid gland is suboptimally visualized on the current exam. The trachea appears patent. Normal appearance of the esophagus. Index right paratracheal lymph node measures 1.9 cm, image 21/3. Previously 2.1 cm. No enlarged hilar lymph nodes. LUNGS/PLEURA: THERE IS NO PLEURAL EFFUSION, AIRSPACE CONSOLIDATION OR PNEUMOTHORAX.  PARAMEDIASTINAL FIBROSIS AND ARCHITECTURAL DISTORTION IS IDENTIFIED WITHIN THE RIGHT UPPER LOBE AND SUPERIOR SEGMENT OF RIGHT LOWER LOBE.  THERE ARE 2 LESIONS WITHIN THE  PARAMEDIASTINAL RIGHT UPPER LOBE, INCLUDING: -the anterior lung lesion within the medial right upper lobe measures 1.6 x 1.1 cm, image 19/3 and image 46/4. On the previous exam this was measured at 1.6 x 1.1 cm. -the posterior lung lesion within the medial right upper lobe measures 1.6 x 1.1 cm, image 24/3 and image 58/4. On the previous exam this measured the same. No new solid lung nodules identified bilaterally. New ground-glass nodule within the right apex measures 0.9 cm, image 29/4. Upper Abdomen: No acute abnormality. Previous right nephrectomy. 1.3 cm gallstone. Small low-density lesions within the anterior left hepatic lobe are unchanged and are favored to represent liver cysts. Upper pole left kidney cyst measures 1.7 cm, image 60/3. No follow-up imaging recommended. Musculoskeletal: No acute or suspicious osseous findings. Scoliosis in degenerative disc disease noted within the thoracolumbar spine. IMPRESSION: 1. Interval appearance of paramediastinal fibrosis and architectural distortion within the right upper lobe and superior segment of right lower lobe. These findings are compatible with changes due to external beam radiation. 2. The underlying treated lung lesions do not appear significantly changed in size from the previous exam. 3. New ground-glass nodule within the right apex measures 0.9 cm. This is favored to represent an  infectious or inflammatory process. Attention on follow-up imaging advised. 4. Stable appearance of enlarged right paratracheal lymph node. 5. Cholelithiasis. 6. Previous tracer avid nodule in the left lobe of thyroid gland is suboptimally visualized on the current exam. 7.  Aortic Atherosclerosis (ICD10-I70.0). Electronically Signed   By: Signa Kell M.D.   On: 06/01/2023 10:56    ASSESSMENT AND PLAN: This is a very pleasant 82 years old white male with Stage IIIb (T3, N2, M0) non-small cell lung cancer, squamous cell carcinoma presented with 2 separate right upper lobe lung nodule in addition to mediastinal lymphadenopathy diagnosed in January 2024. The patient has no evidence of metastatic disease to the brain or extrathoracic metastasis besides the 2 right upper lobe pulmonary nodule and mediastinal lymphadenopathy. I discussed his case with Dr. Dorris Fetch, cardiothoracic surgery and he indicated that because of the location of the lymph node, the patient will not be a great candidate for surgical resection with negative margin. The patient underwent a course of concurrent chemoradiation with weekly carboplatin for AUC of 2 and paclitaxel 45 Mg/M2.  Status post 6 cycles.  His treatment with paclitaxel was changed to Abraxane starting from cycle #3 with secondary to hypersensitivity reaction. He tolerated this treatment fairly well with partial response. He is currently undergoing consolidation treatment with immunotherapy with Imfinzi 1500 Mg IV every 4 weeks status post  3 cycles.   The patient has been tolerating this treatment well with no concerning adverse effects. He had repeat CT scan of the neck and chest performed recently.  I personally and independently reviewed the scan images and discussed the results with the patient and his wife. His scan showed no concerning findings for disease progression.  It also showed the left thyroid nodule and ultrasound was recommended for further  evaluation. I recommended for the patient to continue his current treatment with immunotherapy and he will proceed with cycle #4 today. For the thyroid nodule I will order ultrasound of the thyroid to rule out any neoplasm. I will see the patient back for follow-up visit in 4 weeks for evaluation before the next cycle of his treatment. He was advised to call immediately if he has any other concerning symptoms in the interval. The patient voices understanding of current disease status and treatment options and is in agreement with the current care plan.  All questions were answered. The patient knows to call the clinic with any problems, questions or concerns. We can certainly see the patient much sooner if necessary.  The total time spent in the appointment was 30 minutes.  Disclaimer: This note was dictated with voice recognition software. Similar sounding words can inadvertently be transcribed and may not be corrected upon review.

## 2023-06-04 NOTE — Patient Instructions (Signed)
[facility]  Discharge Instructions: Thank you for choosing [facility] to provide your oncology and hematology care.   If you have a lab appointment with the Cancer Center, please go directly to the Cancer Center and check in at the registration area.   Wear comfortable clothing and clothing appropriate for easy access to any Portacath or PICC line.   We strive to give you quality time with your provider. You may need to reschedule your appointment if you arrive late (15 or more minutes).  Arriving late affects you and other patients whose appointments are after yours.  Also, if you miss three or more appointments without notifying the office, you may be dismissed from the clinic at the provider's discretion.      For prescription refill requests, have your pharmacy contact our office and allow 72 hours for refills to be completed.    Today you received the following chemotherapy and/or immunotherapy agents imfinzi      To help prevent nausea and vomiting after your treatment, we encourage you to take your nausea medication as directed.  BELOW ARE SYMPTOMS THAT SHOULD BE REPORTED IMMEDIATELY: *FEVER GREATER THAN 100.4 F (38 C) OR HIGHER *CHILLS OR SWEATING *NAUSEA AND VOMITING THAT IS NOT CONTROLLED WITH YOUR NAUSEA MEDICATION *UNUSUAL SHORTNESS OF BREATH *UNUSUAL BRUISING OR BLEEDING *URINARY PROBLEMS (pain or burning when urinating, or frequent urination) *BOWEL PROBLEMS (unusual diarrhea, constipation, pain near the anus) TENDERNESS IN MOUTH AND THROAT WITH OR WITHOUT PRESENCE OF ULCERS (sore throat, sores in mouth, or a toothache) UNUSUAL RASH, SWELLING OR PAIN  UNUSUAL VAGINAL DISCHARGE OR ITCHING   Items with * indicate a potential emergency and should be followed up as soon as possible or go to the Emergency Department if any problems should occur.  Please show the CHEMOTHERAPY ALERT CARD or IMMUNOTHERAPY ALERT CARD at check-in  to the Emergency Department and triage nurse.  Should you have questions after your visit or need to cancel or reschedule your appointment, please contact [facility]  Dept: [phone]  and follow the prompts.  Office hours are 8:00 a.m. to 4:30 p.m. Monday - Friday. Please note that voicemails left after 4:00 p.m. may not be returned until the following business day.  We are closed weekends and major holidays. You have access to a nurse at all times for urgent questions. Please call the main number to the clinic Dept: [phone] and follow the prompts.   For any non-urgent questions, you may also contact your provider using MyChart. We now offer e-Visits for anyone 18 and older to request care online for non-urgent symptoms. For details visit [email or web address].   Also download the MyChart app! Go to the app store, search "MyChart", open the app, select [facility], and log in with your MyChart username and password.   

## 2023-06-05 DIAGNOSIS — L309 Dermatitis, unspecified: Secondary | ICD-10-CM | POA: Diagnosis not present

## 2023-06-05 DIAGNOSIS — D2272 Melanocytic nevi of left lower limb, including hip: Secondary | ICD-10-CM | POA: Diagnosis not present

## 2023-06-05 DIAGNOSIS — D225 Melanocytic nevi of trunk: Secondary | ICD-10-CM | POA: Diagnosis not present

## 2023-06-05 DIAGNOSIS — D1801 Hemangioma of skin and subcutaneous tissue: Secondary | ICD-10-CM | POA: Diagnosis not present

## 2023-06-05 DIAGNOSIS — D692 Other nonthrombocytopenic purpura: Secondary | ICD-10-CM | POA: Diagnosis not present

## 2023-06-05 DIAGNOSIS — Z85828 Personal history of other malignant neoplasm of skin: Secondary | ICD-10-CM | POA: Diagnosis not present

## 2023-06-05 DIAGNOSIS — L814 Other melanin hyperpigmentation: Secondary | ICD-10-CM | POA: Diagnosis not present

## 2023-06-05 DIAGNOSIS — D2271 Melanocytic nevi of right lower limb, including hip: Secondary | ICD-10-CM | POA: Diagnosis not present

## 2023-06-05 DIAGNOSIS — L821 Other seborrheic keratosis: Secondary | ICD-10-CM | POA: Diagnosis not present

## 2023-06-05 DIAGNOSIS — L570 Actinic keratosis: Secondary | ICD-10-CM | POA: Diagnosis not present

## 2023-06-06 ENCOUNTER — Other Ambulatory Visit: Payer: Self-pay

## 2023-06-07 ENCOUNTER — Other Ambulatory Visit: Payer: Self-pay

## 2023-06-25 ENCOUNTER — Ambulatory Visit (HOSPITAL_COMMUNITY)
Admission: RE | Admit: 2023-06-25 | Discharge: 2023-06-25 | Disposition: A | Discharge status: Home / Self Care | Payer: Medicare Other | Source: Ambulatory Visit | Attending: Internal Medicine | Admitting: Internal Medicine

## 2023-06-25 ENCOUNTER — Other Ambulatory Visit: Payer: Self-pay | Admitting: Internal Medicine

## 2023-06-25 DIAGNOSIS — E041 Nontoxic single thyroid nodule: Secondary | ICD-10-CM

## 2023-06-25 DIAGNOSIS — C3491 Malignant neoplasm of unspecified part of right bronchus or lung: Secondary | ICD-10-CM | POA: Diagnosis not present

## 2023-06-28 ENCOUNTER — Telehealth: Payer: Self-pay | Admitting: Medical Oncology

## 2023-06-28 ENCOUNTER — Other Ambulatory Visit: Payer: Self-pay | Admitting: Medical Oncology

## 2023-06-28 DIAGNOSIS — K209 Esophagitis, unspecified without bleeding: Secondary | ICD-10-CM

## 2023-06-28 MED ORDER — SUCRALFATE 1 G PO TABS
1.0000 g | ORAL_TABLET | Freq: Three times a day (TID) | ORAL | 0 refills | Status: DC
Start: 2023-06-28 — End: 2023-09-24

## 2023-06-28 NOTE — Progress Notes (Signed)
Explained reason for bx.He will call and schedule it.

## 2023-06-28 NOTE — Telephone Encounter (Signed)
Returned pt call . He received a message from imaging to call to schedule an appt. -"What is it for?"  ILVM that I was not sure.  It  may be related to a f/u bx for his thyroid nodule found on [place]

## 2023-07-02 ENCOUNTER — Inpatient Hospital Stay: Payer: Medicare Other | Attending: Internal Medicine | Admitting: Internal Medicine

## 2023-07-02 ENCOUNTER — Other Ambulatory Visit: Payer: Self-pay | Admitting: *Deleted

## 2023-07-02 ENCOUNTER — Inpatient Hospital Stay: Payer: Medicare Other

## 2023-07-02 DIAGNOSIS — Z5112 Encounter for antineoplastic immunotherapy: Secondary | ICD-10-CM | POA: Insufficient documentation

## 2023-07-02 DIAGNOSIS — C3491 Malignant neoplasm of unspecified part of right bronchus or lung: Secondary | ICD-10-CM

## 2023-07-02 DIAGNOSIS — E041 Nontoxic single thyroid nodule: Secondary | ICD-10-CM | POA: Insufficient documentation

## 2023-07-02 DIAGNOSIS — R053 Chronic cough: Secondary | ICD-10-CM | POA: Diagnosis not present

## 2023-07-02 DIAGNOSIS — Z95828 Presence of other vascular implants and grafts: Secondary | ICD-10-CM

## 2023-07-02 DIAGNOSIS — C3411 Malignant neoplasm of upper lobe, right bronchus or lung: Secondary | ICD-10-CM | POA: Diagnosis not present

## 2023-07-02 LAB — CBC WITH DIFFERENTIAL (CANCER CENTER ONLY)
Abs Immature Granulocytes: 0.01 10*3/uL (ref 0.00–0.07)
Basophils Absolute: 0 10*3/uL (ref 0.0–0.1)
Basophils Relative: 0 %
Eosinophils Absolute: 0.1 10*3/uL (ref 0.0–0.5)
Eosinophils Relative: 2 %
HCT: 42.4 % (ref 39.0–52.0)
Hemoglobin: 14.6 g/dL (ref 13.0–17.0)
Immature Granulocytes: 0 %
Lymphocytes Relative: 10 %
Lymphs Abs: 0.6 10*3/uL — ABNORMAL LOW (ref 0.7–4.0)
MCH: 29.1 pg (ref 26.0–34.0)
MCHC: 34.4 g/dL (ref 30.0–36.0)
MCV: 84.5 fL (ref 80.0–100.0)
Monocytes Absolute: 0.6 10*3/uL (ref 0.1–1.0)
Monocytes Relative: 11 %
Neutro Abs: 4.4 10*3/uL (ref 1.7–7.7)
Neutrophils Relative %: 77 %
Platelet Count: 267 10*3/uL (ref 150–400)
RBC: 5.02 MIL/uL (ref 4.22–5.81)
RDW: 12.1 % (ref 11.5–15.5)
WBC Count: 5.8 10*3/uL (ref 4.0–10.5)
nRBC: 0 % (ref 0.0–0.2)

## 2023-07-02 LAB — CMP (CANCER CENTER ONLY)
ALT: 13 U/L (ref 0–44)
AST: 19 U/L (ref 15–41)
Albumin: 4 g/dL (ref 3.5–5.0)
Alkaline Phosphatase: 84 U/L (ref 38–126)
Anion gap: 8 (ref 5–15)
BUN: 24 mg/dL — ABNORMAL HIGH (ref 8–23)
CO2: 23 mmol/L (ref 22–32)
Calcium: 9.5 mg/dL (ref 8.9–10.3)
Chloride: 108 mmol/L (ref 98–111)
Creatinine: 1.07 mg/dL (ref 0.61–1.24)
GFR, Estimated: >60 mL/min (ref >60)
Glucose, Bld: 125 mg/dL — ABNORMAL HIGH (ref 70–99)
Potassium: 3.9 mmol/L (ref 3.5–5.1)
Sodium: 139 mmol/L (ref 135–145)
Total Bilirubin: 0.8 mg/dL (ref 0.3–1.2)
Total Protein: 7.5 g/dL (ref 6.5–8.1)

## 2023-07-02 MED ORDER — LIDOCAINE-PRILOCAINE 2.5-2.5 % EX CREA
TOPICAL_CREAM | CUTANEOUS | 0 refills | Status: AC
Start: 2023-07-02 — End: ?

## 2023-07-02 MED ORDER — SODIUM CHLORIDE 0.9 % IV SOLN
1500.0000 mg | Freq: Once | INTRAVENOUS | Status: AC
  Administered 2023-07-02: 1500 mg via INTRAVENOUS
  Filled 2023-07-02: qty 30

## 2023-07-02 MED ORDER — SODIUM CHLORIDE 0.9 % IV SOLN: Freq: Once | INTRAVENOUS | Status: AC

## 2023-07-02 NOTE — Progress Notes (Signed)
[facility] [phone]   [phone]  OFFICE PROGRESS NOTE  [name], Alan Koh, MD [address] [place] [address]  DIAGNOSIS:  1) Stage IIIb (T3, N2, M0) non-small cell lung cancer, squamous cell carcinoma presented with 2 separate right upper lobe lung nodule in addition to mediastinal lymphadenopathy diagnosed in January 2024. 2) left thyroid nodule.  PDL1 expression: 1%   PRIOR THERAPY: A course of concurrent chemoradiation with weekly carboplatin for AUC of 2 and paclitaxel 45 Mg/M2. Last dose on 02/05/23. Status post 6 cycles. Taxol changed to abraxane starting from cycle #4 due to reaction to taxol    CURRENT THERAPY: Consolidation immunotherapy with Imfinzi 1500 mg IV every 4 weeks.  First dose expected on 03/12/23.  Status post 4 cycles.  INTERVAL HISTORY: Alan Mendez 82 y.o. male returns to the clinic today for follow-up visit.  The patient is feeling fine today with no concerning complaints.  He exercises at regular basis.  He denied having any current chest pain, shortness of breath but continues to have dry cough with no hemoptysis.  He has no nausea, vomiting, diarrhea or constipation.  He has no headache or visual changes.  He had ultrasound of the thyroid that showed left thyroid nodule that meet criteria for biopsy.  The patient is here today for evaluation before starting cycle #5 of his treatment with immunotherapy.  MEDICAL HISTORY: Past Medical History:  Diagnosis Date   Acquired solitary kidney    left --  s/p  right nephroureterctomy 01/ 2018   Anxiety    no current problems   Depression    no current problems   Elevated PSA    Enlarged lymph node 11/2022   enlarged right paratracheal lymph node.   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, negative margins)   History of kidney stones    passed stones and also surgery to remove   History of unilateral nephrectomy    01/ 2018  right nephroureterectomy for renal pelvis mass (carcinoma in situ)   HLD (hyperlipidemia)    on crestor   Hyperplasia of prostate without lower urinary tract symptoms (LUTS)    Lung cancer (HCC) 12/07/2022   Nephrolithiasis    bilateral non-obstructive   Pneumonia    x 1 - yrs ago   Pulmonary nodules 11/2022   2 right upper lobe pulmonary nodules   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HCC)    Renal cyst, acquired, right    right kidney removed   Wears glasses     ALLERGIES:  is allergic to paclitaxel, lamisil [terbinafine], and ivp dye [iodinated contrast media].  MEDICATIONS:  Current Outpatient Medications  Medication Sig Dispense Refill   cholecalciferol (VITAMIN D3) 25 MCG (1000 UNIT) tablet Take 1,000 Units by mouth daily.     Multiple Vitamins-Minerals (MULTIVITAMIN WITH MINERALS) tablet Take 1 tablet by mouth daily. Centrum     prochlorperazine (COMPAZINE) 10 MG tablet Take 10 mg by mouth every 6 (six) hours as needed for nausea or vomiting. (Patient not taking: Reported on 01/15/2023)     rosuvastatin (CRESTOR) 10 MG tablet TAKE 1 TABLET EACH DAY. (Patient taking differently: Take 20 mg by mouth daily.) 30 tablet 0   silver sulfADIAZINE (SILVADENE)  1 % cream Apply 1 Application topically daily. 50 g 0   sucralfate (CARAFATE) 1 g tablet Take 1 tablet (1 g total) by mouth 4 (four) times daily -  with meals and at bedtime. Dissolve tablet in water -swish and swallow. 30 tablet 0   tamsulosin (FLOMAX) 0.4 MG CAPS capsule Take 1 capsule (0.4 mg total) by mouth daily. (Patient taking differently: Take 0.4 mg by mouth daily after breakfast.) 30 capsule 5   No current facility-administered medications for this visit.    SURGICAL HISTORY:  Past Surgical History:  Procedure Laterality Date    APPENDECTOMY  child   BRONCHIAL BIOPSY  12/07/2022   Procedure: BRONCHIAL BIOPSIES;  Surgeon: Josephine Igo, DO;  Location: [facility];  Service: Pulmonary;;   BRONCHIAL BRUSHINGS  12/07/2022   Procedure: BRONCHIAL BRUSHINGS;  Surgeon: Josephine Igo, DO;  Location: [facility];  Service: Pulmonary;;   BRONCHIAL NEEDLE ASPIRATION BIOPSY  12/07/2022   Procedure: BRONCHIAL NEEDLE ASPIRATION BIOPSIES;  Surgeon: Josephine Igo, DO;  Location: [facility];  Service: Pulmonary;;   CATARACT EXTRACTION W/ INTRAOCULAR LENS  IMPLANT, BILATERAL  2017   COLONOSCOPY  08/15/2012   Tubular Adenoma, No high grade dysplasia or malignacy.   CYSTOSCOPY W/ RETROGRADES Right 09/25/2016   Procedure: CYSTOSCOPY, URETHRAL DILITATION  WITH RETROGRADE PYELOGRAM, BRUSH BIOPSIES OF RIGHT RENAL MASS;  Surgeon: Jethro Bolus, MD;  Location: [facility] [place];  Service: Urology;  Laterality: Right;   CYSTOSCOPY W/ RETROGRADES Left 03/16/2017   Procedure: CYSTOSCOPY WITH RETROGRADE PYELOGRAM;  Surgeon: Crist Fat, MD;  Location: [facility];  Service: Urology;  Laterality: Left;   CYSTOSCOPY W/ URETERAL STENT REMOVAL Right 09/25/2016   Procedure: CYSTOSCOPY WITH STENT REMOVAL;  Surgeon: Jethro Bolus, MD;  Location: [facility] [place];  Service: Urology;  Laterality: Right;   CYSTOSCOPY WITH RETROGRADE PYELOGRAM, URETEROSCOPY AND STENT PLACEMENT Right 08/21/2016   Procedure: CYSTOSCOPY WITH RIGHT RETROGRADE PYELOGRAM, RIGHT FLEXIBLE AND RIGID URETEROSCOPY, INSERTION DOUBLE J STENT RIGHT;  Surgeon: Jethro Bolus, MD;  Location: [facility];  Service: Urology;  Laterality: Right;   EXTRACORPOREAL SHOCK WAVE LITHOTRIPSY  2009   KNEE SURGERY Right 1980's   ROBOT ASSITED LAPAROSCOPIC NEPHROURETERECTOMY Right 11/15/2016   Procedure: RIGHT XI ROBOT ASSITED LAPAROSCOPIC NEPHROURETERECTOMY;  Surgeon: Crist Fat, MD;  Location: [facility];  Service: Urology;   Laterality: Right;   TRANSURETHRAL RESECTION OF BLADDER TUMOR WITH MITOMYCIN-C Left 03/16/2017   Procedure: CYSTOSCOPY BIOPSIES OF BLADDER TUMOR WITH FULGURATION;  Surgeon: Crist Fat, MD;  Location: [facility];  Service: Urology;  Laterality: Left;   URETEROSCOPY Right 09/25/2016   Procedure: RIGHT URETEROSCOPY;  Surgeon: Jethro Bolus, MD;  Location: [facility];  Service: Urology;  Laterality: Right;   VIDEO BRONCHOSCOPY WITH ENDOBRONCHIAL ULTRASOUND Right 12/07/2022   Procedure: VIDEO BRONCHOSCOPY WITH ENDOBRONCHIAL ULTRASOUND;  Surgeon: Josephine Igo, DO;  Location: [facility];  Service: Pulmonary;  Laterality: Right;    REVIEW OF SYSTEMS:  A comprehensive review of systems was negative.   PHYSICAL EXAMINATION: General appearance: alert, cooperative, and no distress Head: Normocephalic, without obvious abnormality, atraumatic Neck: no adenopathy, no JVD, supple, symmetrical, trachea midline, and thyroid not enlarged, symmetric, no tenderness/mass/nodules Lymph nodes: Cervical, supraclavicular, and axillary nodes normal. Resp: clear to auscultation bilaterally Back: symmetric, no curvature. ROM normal. No CVA tenderness. Cardio: regular rate and rhythm, S1, S2 normal, no murmur, click, rub or gallop GI: soft, non-tender; bowel sounds normal; no masses,  no organomegaly Extremities: extremities normal, atraumatic, no cyanosis or edema  ECOG PERFORMANCE STATUS: 1 - Symptomatic but completely ambulatory  Blood pressure (!) 145/80, pulse 75, temperature 98.5 F (36.9 C), temperature source Oral, resp. rate 16, height 5\' 5"  (1.651 m), weight 138 lb (62.6 kg), SpO2 98%.  LABORATORY DATA: Lab Results  Component Value Date   WBC 5.8 07/02/2023   HGB 14.6 07/02/2023   HCT 42.4 07/02/2023   MCV 84.5 07/02/2023   PLT 267 07/02/2023      Chemistry      Component Value Date/Time   NA 139 07/02/2023 1107   K 3.9 07/02/2023 1107   CL 108  07/02/2023 1107   CO2 23 07/02/2023 1107   BUN 24 (H) 07/02/2023 1107   CREATININE 1.07 07/02/2023 1107   CREATININE 0.85 12/10/2014 1550      Component Value Date/Time   CALCIUM 9.5 07/02/2023 1107   ALKPHOS 84 07/02/2023 1107   AST 19 07/02/2023 1107   ALT 13 07/02/2023 1107   BILITOT 0.8 07/02/2023 1107       RADIOGRAPHIC STUDIES: US THYROID  Result Date: 06/25/2023 CLINICAL DATA:  Incidental on CT. EXAM: THYROID ULTRASOUND TECHNIQUE: Ultrasound examination of the thyroid gland and adjacent soft tissues was performed. COMPARISON:  July 2024 FINDINGS: Parenchymal Echotexture: Normal Isthmus: 0.5 cm Right lobe: 3.8 x 2.0 x 1.8 cm Left lobe: 4.3 x 2.4 x 2.3 cm _________________________________________________________ Estimated total number of nodules >/= 1 cm: 1 Number of spongiform nodules >/=  2 cm not described below (TR1): 0 Number of mixed cystic and solid nodules >/= 1.5 cm not described below (TR2): 0 _________________________________________________________ Nodule labeled 1 is a solid hypoechoic TR 4 nodule in the mid left thyroid lobe that measures 2.1 x 1.9 x 1.4 cm. **Given size (>/= 1.5 cm) and appearance, fine needle aspiration of this moderately suspicious nodule should be considered based on TI-RADS criteria. IMPRESSION: Solitary nodule in the left thyroid lobe (2.1 cm TR 4) meets criteria for biopsy. The above is in keeping with the ACR TI-RADS recommendations - J Am Coll Radiol 2017;14:587-595. Electronically Signed   By: Olive Bass M.D.   On: 06/25/2023 15:36    ASSESSMENT AND PLAN: This is a very pleasant 82 years old white male with Stage IIIb (T3, N2, M0) non-small cell lung cancer, squamous cell carcinoma presented with 2 separate right upper lobe lung nodule in addition to mediastinal lymphadenopathy diagnosed in January 2024. The patient has no evidence of metastatic disease to the brain or extrathoracic metastasis besides the 2 right upper lobe pulmonary nodule and  mediastinal lymphadenopathy. I discussed his case with Dr. Dorris Fetch, cardiothoracic surgery and he indicated that because of the location of the lymph node, the patient will not be a great candidate for surgical resection with negative margin. The patient underwent a course of concurrent chemoradiation with weekly carboplatin for AUC of 2 and paclitaxel 45 Mg/M2.  Status post 6 cycles.  His treatment with paclitaxel was changed to Abraxane starting from cycle #3 with secondary to hypersensitivity reaction. He tolerated this treatment fairly well with partial response. He is currently undergoing consolidation treatment with immunotherapy with Imfinzi 1500 Mg IV every 4 weeks status post 4 cycles.   The patient has been tolerating his treatment with Imfinzi fairly well with no concerning adverse effect but he has persistent cough from the previous radiotherapy. I recommended for him to proceed with cycle #5 today as planned. For the thyroid nodule, he had ultrasound of  the thyroid that showed the suspicious left thyroid nodule.  I recommended for the patient to have fine-needle aspiration of this nodule to rule out any malignancy. He will come back for follow-up visit in 4 weeks for evaluation before the next cycle of his treatment. The patient voices understanding of current disease status and treatment options and is in agreement with the current care plan.  All questions were answered. The patient knows to call the clinic with any problems, questions or concerns. We can certainly see the patient much sooner if necessary.  The total time spent in the appointment was 20 minutes.  Disclaimer: This note was dictated with voice recognition software. Similar sounding words can inadvertently be transcribed and may not be corrected upon review.

## 2023-07-02 NOTE — Patient Instructions (Signed)
[facility]  Discharge Instructions: Thank you for choosing [facility] to provide your oncology and hematology care.   If you have a lab appointment with the Cancer Center, please go directly to the Cancer Center and check in at the registration area.   Wear comfortable clothing and clothing appropriate for easy access to any Portacath or PICC line.   We strive to give you quality time with your provider. You may need to reschedule your appointment if you arrive late (15 or more minutes).  Arriving late affects you and other patients whose appointments are after yours.  Also, if you miss three or more appointments without notifying the office, you may be dismissed from the clinic at the provider's discretion.      For prescription refill requests, have your pharmacy contact our office and allow 72 hours for refills to be completed.    Today you received the following chemotherapy and/or immunotherapy agents imfinzi      To help prevent nausea and vomiting after your treatment, we encourage you to take your nausea medication as directed.  BELOW ARE SYMPTOMS THAT SHOULD BE REPORTED IMMEDIATELY: *FEVER GREATER THAN 100.4 F (38 C) OR HIGHER *CHILLS OR SWEATING *NAUSEA AND VOMITING THAT IS NOT CONTROLLED WITH YOUR NAUSEA MEDICATION *UNUSUAL SHORTNESS OF BREATH *UNUSUAL BRUISING OR BLEEDING *URINARY PROBLEMS (pain or burning when urinating, or frequent urination) *BOWEL PROBLEMS (unusual diarrhea, constipation, pain near the anus) TENDERNESS IN MOUTH AND THROAT WITH OR WITHOUT PRESENCE OF ULCERS (sore throat, sores in mouth, or a toothache) UNUSUAL RASH, SWELLING OR PAIN  UNUSUAL VAGINAL DISCHARGE OR ITCHING   Items with * indicate a potential emergency and should be followed up as soon as possible or go to the Emergency Department if any problems should occur.  Please show the CHEMOTHERAPY ALERT CARD or IMMUNOTHERAPY ALERT CARD at check-in  to the Emergency Department and triage nurse.  Should you have questions after your visit or need to cancel or reschedule your appointment, please contact [facility]  Dept: [phone]  and follow the prompts.  Office hours are 8:00 a.m. to 4:30 p.m. Monday - Friday. Please note that voicemails left after 4:00 p.m. may not be returned until the following business day.  We are closed weekends and major holidays. You have access to a nurse at all times for urgent questions. Please call the main number to the clinic Dept: [phone] and follow the prompts.   For any non-urgent questions, you may also contact your provider using MyChart. We now offer e-Visits for anyone 18 and older to request care online for non-urgent symptoms. For details visit [email or web address].   Also download the MyChart app! Go to the app store, search "MyChart", open the app, select [facility], and log in with your MyChart username and password.   

## 2023-07-04 ENCOUNTER — Other Ambulatory Visit: Payer: Self-pay

## 2023-07-05 ENCOUNTER — Other Ambulatory Visit: Payer: Self-pay | Admitting: Radiology

## 2023-07-05 ENCOUNTER — Other Ambulatory Visit: Payer: Self-pay

## 2023-07-09 ENCOUNTER — Other Ambulatory Visit: Payer: Self-pay

## 2023-07-09 ENCOUNTER — Encounter (HOSPITAL_COMMUNITY): Payer: Self-pay

## 2023-07-09 ENCOUNTER — Ambulatory Visit (HOSPITAL_COMMUNITY)
Admission: RE | Admit: 2023-07-09 | Discharge: 2023-07-09 | Disposition: A | Discharge status: Home / Self Care | Payer: Medicare Other | Source: Ambulatory Visit | Attending: Internal Medicine | Admitting: Internal Medicine

## 2023-07-09 DIAGNOSIS — Z87891 Personal history of nicotine dependence: Secondary | ICD-10-CM | POA: Insufficient documentation

## 2023-07-09 DIAGNOSIS — C3490 Malignant neoplasm of unspecified part of unspecified bronchus or lung: Secondary | ICD-10-CM | POA: Diagnosis not present

## 2023-07-09 DIAGNOSIS — I878 Other specified disorders of veins: Secondary | ICD-10-CM | POA: Diagnosis not present

## 2023-07-09 HISTORY — PX: IR IMAGING GUIDED PORT INSERTION: IMG5740

## 2023-07-09 MED ORDER — SODIUM CHLORIDE 0.9 % IV SOLN: INTRAVENOUS | Status: DC

## 2023-07-09 MED ORDER — LIDOCAINE-EPINEPHRINE 1 %-1:100000 IJ SOLN
20.0000 mL | Freq: Once | INTRAMUSCULAR | Status: AC
  Administered 2023-07-09: 15 mL via INTRADERMAL

## 2023-07-09 MED ORDER — FENTANYL CITRATE (PF) 100 MCG/2ML IJ SOLN
INTRAMUSCULAR | Status: AC | PRN
Start: 2023-07-09 — End: 2023-07-09
  Administered 2023-07-09 (×2): 50 ug via INTRAVENOUS

## 2023-07-09 MED ORDER — MIDAZOLAM HCL 2 MG/2ML IJ SOLN
INTRAMUSCULAR | Status: AC
  Filled 2023-07-09: qty 4

## 2023-07-09 MED ORDER — HEPARIN SOD (PORK) LOCK FLUSH 100 UNIT/ML IV SOLN
INTRAVENOUS | Status: AC
  Filled 2023-07-09: qty 5

## 2023-07-09 MED ORDER — MIDAZOLAM HCL 2 MG/2ML IJ SOLN
INTRAMUSCULAR | Status: AC | PRN
Start: 2023-07-09 — End: 2023-07-09
  Administered 2023-07-09 (×2): 1 mg via INTRAVENOUS

## 2023-07-09 MED ORDER — LIDOCAINE-EPINEPHRINE 1 %-1:100000 IJ SOLN
INTRAMUSCULAR | Status: AC
  Filled 2023-07-09: qty 1

## 2023-07-09 MED ORDER — HEPARIN SOD (PORK) LOCK FLUSH 100 UNIT/ML IV SOLN
500.0000 [IU] | Freq: Once | INTRAVENOUS | Status: AC
  Administered 2023-07-09: 500 [IU] via INTRAVENOUS

## 2023-07-09 MED ORDER — FENTANYL CITRATE (PF) 100 MCG/2ML IJ SOLN
INTRAMUSCULAR | Status: AC
  Filled 2023-07-09: qty 2

## 2023-07-09 NOTE — Procedures (Signed)
Interventional Radiology Procedure Note ° °Procedure: Single Lumen Power Port Placement   ° °Access:  Right internal jugular vein ° °Findings: Catheter tip positioned at cavoatrial junction. Port is ready for immediate use.  ° °Complications: None ° °EBL: < 10 mL ° °Recommendations:  °- Ok to shower in 24 hours °- Do not submerge for 7 days °- Routine line care  ° ° °Dylan Suttle, MD ° ° ° °

## 2023-07-09 NOTE — H&P (Signed)
Chief Complaint: Patient was seen in consultation today for lung cancer  Referring Physician(s): Alan Mendez,Alan Mendez  Supervising Physician: Alan Mendez  Patient Status: [facility] - Out-pt  History of Present Illness: Alan Mendez is a 82 y.o. male with past medical history of non-small cell cancer, squamous cell carcinoma with 2 separate right upper lobe masses with mediastinal lymphadenopathy.  He has completed concurrent chemoradiation via peripheral IVs with increasing difficulty.  Patient referred to IR for Port-A-Cath placement.   Alan Mendez presents today in his usual state of health.  He has been NPO.  He is understanding of the goals of the procedure and is agreeable to proceed.  He does want sedation for this procedure and his wife is available for post-procedure care and transportation.   Past Medical History:  Diagnosis Date   Acquired solitary kidney    left --  s/p  right nephroureterctomy 01/ 2018   Anxiety    no current problems   Depression    no current problems   Elevated PSA    Enlarged lymph node 11/2022   enlarged right paratracheal lymph node.   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, negative margins)   History of kidney stones    passed stones and also surgery to remove   History of unilateral nephrectomy    01/ 2018  right nephroureterectomy for renal pelvis mass (carcinoma in situ)   HLD (hyperlipidemia)    on crestor   Hyperplasia of prostate without lower urinary tract symptoms (LUTS)    Lung cancer (HCC) 12/07/2022   Nephrolithiasis    bilateral non-obstructive   Pneumonia    x 1 - yrs ago   Pulmonary nodules 11/2022   2 right upper lobe pulmonary nodules   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HCC)    Renal cyst,  acquired, right    right kidney removed   Wears glasses     Past Surgical History:  Procedure Laterality Date   APPENDECTOMY  child   BRONCHIAL BIOPSY  12/07/2022   Procedure: BRONCHIAL BIOPSIES;  Surgeon: Josephine Igo, DO;  Location: [facility];  Service: Pulmonary;;   BRONCHIAL BRUSHINGS  12/07/2022   Procedure: BRONCHIAL BRUSHINGS;  Surgeon: Josephine Igo, DO;  Location: [facility];  Service: Pulmonary;;   BRONCHIAL NEEDLE ASPIRATION BIOPSY  12/07/2022   Procedure: BRONCHIAL NEEDLE ASPIRATION BIOPSIES;  Surgeon: Josephine Igo, DO;  Location: [facility];  Service: Pulmonary;;   CATARACT EXTRACTION W/ INTRAOCULAR LENS  IMPLANT, BILATERAL  2017   COLONOSCOPY  08/15/2012   Tubular Adenoma, No high grade dysplasia or malignacy.   CYSTOSCOPY W/ RETROGRADES Right 09/25/2016   Procedure: CYSTOSCOPY, URETHRAL DILITATION  WITH RETROGRADE PYELOGRAM, BRUSH BIOPSIES OF RIGHT RENAL MASS;  Surgeon: Jethro Bolus, MD;  Location: [facility];  Service: Urology;  Laterality: Right;   CYSTOSCOPY W/ RETROGRADES Left 03/16/2017   Procedure: CYSTOSCOPY WITH RETROGRADE PYELOGRAM;  Surgeon: Crist Fat, MD;  Location: [facility];  Service: Urology;  Laterality: Left;   CYSTOSCOPY W/ URETERAL STENT REMOVAL Right 09/25/2016   Procedure: CYSTOSCOPY WITH STENT REMOVAL;  Surgeon: Jethro Bolus, MD;  Location: [facility];  Service: Urology;  Laterality: Right;   CYSTOSCOPY WITH RETROGRADE PYELOGRAM, URETEROSCOPY AND STENT PLACEMENT Right 08/21/2016  Procedure: CYSTOSCOPY WITH RIGHT RETROGRADE PYELOGRAM, RIGHT FLEXIBLE AND RIGID URETEROSCOPY, INSERTION DOUBLE J STENT RIGHT;  Surgeon: Jethro Bolus, MD;  Location: [facility];  Service: Urology;  Laterality: Right;   EXTRACORPOREAL SHOCK WAVE LITHOTRIPSY  2009   KNEE SURGERY Right 1980's   ROBOT ASSITED LAPAROSCOPIC NEPHROURETERECTOMY Right 11/15/2016   Procedure: RIGHT XI  ROBOT ASSITED LAPAROSCOPIC NEPHROURETERECTOMY;  Surgeon: Crist Fat, MD;  Location: [facility];  Service: Urology;  Laterality: Right;   TRANSURETHRAL RESECTION OF BLADDER TUMOR WITH MITOMYCIN-C Left 03/16/2017   Procedure: CYSTOSCOPY BIOPSIES OF BLADDER TUMOR WITH FULGURATION;  Surgeon: Crist Fat, MD;  Location: [facility];  Service: Urology;  Laterality: Left;   URETEROSCOPY Right 09/25/2016   Procedure: RIGHT URETEROSCOPY;  Surgeon: Jethro Bolus, MD;  Location: [facility];  Service: Urology;  Laterality: Right;   VIDEO BRONCHOSCOPY WITH ENDOBRONCHIAL ULTRASOUND Right 12/07/2022   Procedure: VIDEO BRONCHOSCOPY WITH ENDOBRONCHIAL ULTRASOUND;  Surgeon: Josephine Igo, DO;  Location: [facility];  Service: Pulmonary;  Laterality: Right;    Allergies: Paclitaxel, Lamisil [terbinafine], and Ivp dye [iodinated contrast media]  Medications: Prior to Admission medications   Medication Sig Start Date End Date Taking? Authorizing Provider  cholecalciferol (VITAMIN D3) 25 MCG (1000 UNIT) tablet Take 1,000 Units by mouth daily.    [provider]  lidocaine-prilocaine (EMLA) cream Apply small amount cream to port a cath site 30-60 minutes prior to accessing port 07/02/23   Heilingoetter, Cassandra L, PA-C  Multiple Vitamins-Minerals (MULTIVITAMIN WITH MINERALS) tablet Take 1 tablet by mouth daily. Centrum    [provider]  prochlorperazine (COMPAZINE) 10 MG tablet Take 10 mg by mouth every 6 (six) hours as needed for nausea or vomiting. Patient not taking: Reported on 01/15/2023    Si Gaul, MD  rosuvastatin (CRESTOR) 10 MG tablet TAKE 1 TABLET EACH DAY. Patient taking differently: Take 20 mg by mouth daily. 12/31/15   Wallis Bamberg, PA-C  silver sulfADIAZINE (SILVADENE) 1 % cream Apply 1 Application topically daily. 04/01/23   Harris, Cammy Copa, PA-C  sucralfate (CARAFATE) 1 g tablet Take 1 tablet (1 g total) by mouth 4 (four) times  daily -  with meals and at bedtime. Dissolve tablet in water -swish and swallow. 06/28/23   Si Gaul, MD  tamsulosin (FLOMAX) 0.4 MG CAPS capsule Take 1 capsule (0.4 mg total) by mouth daily. Patient taking differently: Take 0.4 mg by mouth daily after breakfast. 09/25/16   Jethro Bolus, MD     Family History  Problem Relation Age of Onset   Heart disease Mother    Cancer Father    Cancer Paternal Grandmother    Heart disease Paternal Grandfather     Social History   Socioeconomic History   Marital status: Married    Spouse name: Not on file   Number of children: Not on file   Years of education: Not on file   Highest education level: Not on file  Occupational History   Occupation: Production designer, theatre/television/film - [employer]  Tobacco Use   Smoking status: Former    Current packs/day: 0.00    Average packs/day: 1 pack/day for 30.0 years (30.0 ttl pk-yrs)    Types: Cigarettes    Start date: 11/14/1955    Quit date: 11/13/1985    Years since quitting: 37.6    Passive exposure: Past   Smokeless tobacco: Never  Vaping Use   Vaping status: Never Used  Substance and Sexual Activity   Alcohol use: Yes  Alcohol/week: 3.0 standard drinks of alcohol    Types: 3 Glasses of wine per week    Comment: occasional wine   Drug use: No   Sexual activity: Not on file  Other Topics Concern   Not on file  Social History Narrative   Not on file   Social Determinants of Health   Financial Resource Strain: Not on file  Food Insecurity: No Food Insecurity (12/24/2022)   Hunger Vital Sign    Worried About Running Out of Food in the Last Year: Never true    Ran Out of Food in the Last Year: Never true  Transportation Needs: No Transportation Needs (12/24/2022)   PRAPARE - Administrator, Civil Service (Medical): No    Lack of Transportation (Non-Medical): No  Physical Activity: Not on file  Stress: Not on file  Social Connections: Not on file     Review of Systems: A 12 point ROS  discussed and pertinent positives are indicated in the HPI above.  All other systems are negative.  Review of Systems  Constitutional:  Negative for fatigue and fever.  Respiratory:  Negative for cough and shortness of breath.   Cardiovascular:  Negative for chest pain.  Gastrointestinal:  Negative for abdominal pain, diarrhea, nausea and vomiting.  Genitourinary:  Negative for dysuria.  Musculoskeletal:  Negative for back pain.  Psychiatric/Behavioral:  Negative for behavioral problems and confusion.     Vital Signs: BP (!) 144/83   Pulse 80   Temp 98.1 F (36.7 C) (Oral)   Resp 16   SpO2 99%   Physical Exam Vitals and nursing note reviewed.  Constitutional:      General: He is not in acute distress.    Appearance: Normal appearance. He is not ill-appearing.  HENT:     Mouth/Throat:     Mouth: Mucous membranes are moist.     Pharynx: Oropharynx is clear.  Cardiovascular:     Rate and Rhythm: Normal rate and regular rhythm.  Pulmonary:     Effort: Pulmonary effort is normal.     Breath sounds: Normal breath sounds.  Abdominal:     General: Abdomen is flat.     Palpations: Abdomen is soft.  Musculoskeletal:     Cervical back: Normal range of motion and neck supple.  Skin:    General: Skin is warm and dry.  Neurological:     General: No focal deficit present.     Mental Status: He is alert and oriented to person, place, and time. Mental status is at baseline.  Psychiatric:        Mood and Affect: Mood normal.        Behavior: Behavior normal.        Thought Content: Thought content normal.        Judgment: Judgment normal.      MD Evaluation Airway: WNL Heart: WNL Abdomen: WNL Chest/ Lungs: WNL ASA  Classification: 3 Mallampati/Airway Score: Two   Imaging: US THYROID  Result Date: 06/25/2023 CLINICAL DATA:  Incidental on CT. EXAM: THYROID ULTRASOUND TECHNIQUE: Ultrasound examination of the thyroid gland and adjacent soft tissues was performed.  COMPARISON:  July 2024 FINDINGS: Parenchymal Echotexture: Normal Isthmus: 0.5 cm Right lobe: 3.8 x 2.0 x 1.8 cm Left lobe: 4.3 x 2.4 x 2.3 cm _________________________________________________________ Estimated total number of nodules >/= 1 cm: 1 Number of spongiform nodules >/=  2 cm not described below (TR1): 0 Number of mixed cystic and solid nodules >/= 1.5 cm not  described below (TR2): 0 _________________________________________________________ Nodule labeled 1 is a solid hypoechoic TR 4 nodule in the mid left thyroid lobe that measures 2.1 x 1.9 x 1.4 cm. **Given size (>/= 1.5 cm) and appearance, fine needle aspiration of this moderately suspicious nodule should be considered based on TI-RADS criteria. IMPRESSION: Solitary nodule in the left thyroid lobe (2.1 cm TR 4) meets criteria for biopsy. The above is in keeping with the ACR TI-RADS recommendations - J Am Coll Radiol 2017;14:587-595. Electronically Signed   By: Olive Bass M.D.   On: 06/25/2023 15:36    Labs:  CBC: Recent Labs    04/10/23 0812 05/07/23 0943 06/04/23 0815 07/02/23 1107  WBC 4.9 5.6 6.5 5.8  HGB 14.1 13.6 15.0 14.6  HCT 40.1 38.9* 43.0 42.4  PLT 197 225 226 267    COAGS: No results for input(s): "INR", "APTT" in the last 8760 hours.  BMP: Recent Labs    04/10/23 0812 05/07/23 0943 06/04/23 0815 07/02/23 1107  NA 141 138 139 139  K 4.4 4.0 4.1 3.9  CL 108 108 108 108  CO2 25 23 24 23   GLUCOSE 105* 107* 91 125*  BUN 20 22 22  24*  CALCIUM 9.1 9.1 9.2 9.5  CREATININE 1.22 1.22 1.13 1.07  GFRNONAA >60 >60 >60 >60    LIVER FUNCTION TESTS: Recent Labs    04/10/23 0812 05/07/23 0943 06/04/23 0815 07/02/23 1107  BILITOT 0.9 0.8 0.6 0.8  AST 21 23 21 19   ALT 13 16 15 13   ALKPHOS 66 67 81 84  PROT 7.1 6.9 7.0 7.5  ALBUMIN 4.1 3.9 4.0 4.0    TUMOR MARKERS: No results for input(s): "AFPTM", "CEA", "CA199", "CHROMGRNA" in the last 8760 hours.  Assessment and Plan: Patient with past medical  history of stage III lung cancer presents with complaint of poor venous access.  IR consulted for Port placement at the request of Dr. Arbutus Ped. Case reviewed by Dr. Elby Showers who approves patient for procedure.  Patient presents today in their usual state of health.  He has been NPO and is not currently on blood thinners.   Of note, he is allergic to IV contrast.  Risks and benefits of image guided port-a-catheter placement was discussed with the patient including, but not limited to bleeding, infection, pneumothorax, or fibrin sheath development and need for additional procedures.  All of the patient's questions were answered, patient is agreeable to proceed. Consent signed and in chart.  Advance Care Plan: The advanced care plan/surrogate decision maker was discussed at the time of visit and documented in the medical record.     Thank you for this interesting consult.  I greatly enjoyed meeting Alan Mendez and look forward to participating in their care.  A copy of this report was sent to the requesting provider on this date.  Electronically Signed: Hoyt Koch, PA 07/09/2023, 10:11 AM   I spent a total of  30 Minutes   in face to face in clinical consultation, greater than 50% of which was counseling/coordinating care for stage III lung cancer.

## 2023-07-09 NOTE — Discharge Instructions (Signed)
 Please call Interventional Radiology clinic [phone] with any questions or concerns.  You may remove your dressing and shower tomorrow.  After the procedure, it is common to have: Discomfort at the port insertion site. Bruising on the skin over the port. This should improve over 3-4 days  Follow these instructions at home:  Medication: Do not use Aspirin or ibuprofen products, such as Advil or Motrin, as it may increase bleeding.  You may resume your usual medications as ordered by your doctor. If your doctor prescribed antibiotics, take them as directed. Do not stop taking them just because you feel better. You need to take the full course of antibiotics.  Eating and drinking: Drink plenty of liquids to keep your urine pale yellow You can resume your regular diet as directed by your doctor   Care of the procedure site Follow instructions from your health care provider about how to take care of your port insertion site. Make sure you: After your port is placed, you will get a manufacturer's information card. The card has information about your port. Keep this card with you at all times Make sure to remember what type of port you have Take care of the port as told by your health care provider DO NOT use EMLA cream for 2 weeks after port placement -the cream will remove surgical glue on your incision Wash your hands with soap and water before and after you change your bandage (dressing). If soap and water are not available, use hand sanitizer Change your dressing as told by your health care provider Leave skin glue, or adhesive strips in place. These skin closures may need to stay in place for 2 weeks or longer Check your port insertion site every day for signs of infection. Check for: Redness, swelling, or pain Fluid or blood Warmth Pus or a bad smell  Activity Return to your normal activities as told by your health care provider. Ask your health care provider what activities  are safe for you Do not lift anything that is heavier than 10 lb (4.5 kg), or the limit that you are told, until your health care provider says that it is safe Do not take baths, swim, or use a hot tub until your health care provider approves. Take showers only. Keep all follow-up visits as told by your doctor  Contact a health care provider if: You cannot flush your port with saline as directed, or you cannot draw blood from the port You have a fever or chills You have redness, swelling, or pain around your port insertion site You have fluid or blood coming from your port insertion site Your port insertion site feels warm to the touch You have pus or a bad smell coming from the port insertion site  Get help right away if: You have chest pain or shortness of breath You have bleeding from your port that you cannot control  Moderate Conscious Sedation-Care After  This sheet gives you information about how to care for yourself after your procedure. Your health care provider may also give you more specific instructions. If you have problems or questions, contact your health care provider.  After the procedure, it is common to have: Sleepiness for several hours. Impaired judgment for several hours. Difficulty with balance. Vomiting if you eat too soon.  Follow these instructions at home:  Rest. Do not participate in activities where you could fall or become injured. Do not drive or use machinery. Do not drink alcohol. Do not take  sleeping pills or medicines that cause drowsiness. Do not make important decisions or sign legal documents. Do not take care of children on your own.  Eating and drinking Follow the diet recommended by your health care provider. Drink enough fluid to keep your urine pale yellow. If you vomit: Drink water, juice, or soup when you can drink without vomiting. Make sure you have little or no nausea before eating solid foods.  General instructions Take  over-the-counter and prescription medicines only as told by your health care provider. Have a responsible adult stay with you for the time you are told. It is important to have someone help care for you until you are awake and alert. Do not smoke. Keep all follow-up visits as told by your health care provider. This is important.  Contact a health care provider if: You are still sleepy or having trouble with balance after 24 hours. You feel light-headed. You keep feeling nauseous or you keep vomiting. You develop a rash. You have a fever. You have redness or swelling around the IV site.  Get help right away if: You have trouble breathing. You have new-onset confusion at home.  This information is not intended to replace advice given to you by your health care provider. Make sure you discuss any questions you have with your healthcare provider.

## 2023-07-13 ENCOUNTER — Telehealth: Payer: Self-pay | Admitting: Medical Oncology

## 2023-07-13 ENCOUNTER — Telehealth: Payer: Self-pay

## 2023-07-13 NOTE — Telephone Encounter (Signed)
Port a cath dressing care given to pt . He wants to leave the guaze and tegaderm over the site. I told him that is fine and to call back Tuesday  and we can walk him through removing the dressing or he can come in here and someone will remove it.

## 2023-07-13 NOTE — Telephone Encounter (Signed)
Pt called regarding when he could take off the bandage for his newly placed portacath.  Pt stated he was told but cannot remember the instructions.  Returned pt's call but was unable to reach pt.  LVM stating that the Tegaderm & Florentina Addison can be removed but do not remove ANY Steristrips or Surgical Glue from the site.  Stated the Steristrips or Surgical Glue will need to naturally fall off on their own.  Instructed pt to contact Dr. Asa Lente office should he have additional questions or concerns.  Also, stated that this information will be sent via MyChart message as well.

## 2023-07-17 ENCOUNTER — Inpatient Hospital Stay: Payer: Medicare Other | Attending: Internal Medicine

## 2023-07-17 ENCOUNTER — Encounter: Payer: Self-pay | Admitting: Internal Medicine

## 2023-07-17 DIAGNOSIS — Z85528 Personal history of other malignant neoplasm of kidney: Secondary | ICD-10-CM | POA: Insufficient documentation

## 2023-07-17 DIAGNOSIS — C3490 Malignant neoplasm of unspecified part of unspecified bronchus or lung: Secondary | ICD-10-CM

## 2023-07-17 DIAGNOSIS — C3491 Malignant neoplasm of unspecified part of right bronchus or lung: Secondary | ICD-10-CM

## 2023-07-17 DIAGNOSIS — C3411 Malignant neoplasm of upper lobe, right bronchus or lung: Secondary | ICD-10-CM | POA: Insufficient documentation

## 2023-07-17 DIAGNOSIS — Z905 Acquired absence of kidney: Secondary | ICD-10-CM | POA: Insufficient documentation

## 2023-07-17 DIAGNOSIS — C679 Malignant neoplasm of bladder, unspecified: Secondary | ICD-10-CM

## 2023-07-17 DIAGNOSIS — Z79899 Other long term (current) drug therapy: Secondary | ICD-10-CM | POA: Insufficient documentation

## 2023-07-17 DIAGNOSIS — Z95828 Presence of other vascular implants and grafts: Secondary | ICD-10-CM

## 2023-07-17 DIAGNOSIS — E041 Nontoxic single thyroid nodule: Secondary | ICD-10-CM | POA: Insufficient documentation

## 2023-07-17 DIAGNOSIS — Z5112 Encounter for antineoplastic immunotherapy: Secondary | ICD-10-CM | POA: Insufficient documentation

## 2023-07-17 MED ORDER — HEPARIN SOD (PORK) LOCK FLUSH 100 UNIT/ML IV SOLN: 500.0000 [IU] | Freq: Once | INTRAVENOUS | Status: DC

## 2023-07-17 MED ORDER — SODIUM CHLORIDE 0.9% FLUSH: 10.0000 mL | Freq: Once | INTRAVENOUS | Status: DC

## 2023-07-17 NOTE — Progress Notes (Signed)
Surgical dsg to right chest PAC removed per pt request. He verbalized he was too nervous to remove himself.   Site is clean, dry, and intact with minimal swelling to PAC as well as yellow bruising. Pt was educated on proper care and advised he could use EMLA cream as prescribed when he comes in for his 9/10 PF w/LAB appt as that is 14 days post procedure. He knows to call with any questions or concerns.

## 2023-07-30 ENCOUNTER — Inpatient Hospital Stay: Payer: Medicare Other

## 2023-07-30 ENCOUNTER — Other Ambulatory Visit: Payer: Self-pay

## 2023-07-30 ENCOUNTER — Inpatient Hospital Stay (HOSPITAL_BASED_OUTPATIENT_CLINIC_OR_DEPARTMENT_OTHER): Payer: Medicare Other | Admitting: Internal Medicine

## 2023-07-30 VITALS — BP 129/68 | HR 76 | Resp 16

## 2023-07-30 VITALS — BP 132/80 | HR 81 | Temp 98.3°F | Resp 16 | Ht 65.0 in | Wt 139.9 lb

## 2023-07-30 DIAGNOSIS — C3490 Malignant neoplasm of unspecified part of unspecified bronchus or lung: Secondary | ICD-10-CM | POA: Diagnosis not present

## 2023-07-30 DIAGNOSIS — R911 Solitary pulmonary nodule: Secondary | ICD-10-CM

## 2023-07-30 DIAGNOSIS — Z5112 Encounter for antineoplastic immunotherapy: Secondary | ICD-10-CM | POA: Diagnosis not present

## 2023-07-30 DIAGNOSIS — C3491 Malignant neoplasm of unspecified part of right bronchus or lung: Secondary | ICD-10-CM | POA: Diagnosis not present

## 2023-07-30 DIAGNOSIS — Z95828 Presence of other vascular implants and grafts: Secondary | ICD-10-CM

## 2023-07-30 DIAGNOSIS — Z85528 Personal history of other malignant neoplasm of kidney: Secondary | ICD-10-CM | POA: Diagnosis not present

## 2023-07-30 DIAGNOSIS — Z905 Acquired absence of kidney: Secondary | ICD-10-CM | POA: Diagnosis not present

## 2023-07-30 DIAGNOSIS — C3411 Malignant neoplasm of upper lobe, right bronchus or lung: Secondary | ICD-10-CM | POA: Diagnosis not present

## 2023-07-30 DIAGNOSIS — E041 Nontoxic single thyroid nodule: Secondary | ICD-10-CM | POA: Diagnosis not present

## 2023-07-30 DIAGNOSIS — C679 Malignant neoplasm of bladder, unspecified: Secondary | ICD-10-CM

## 2023-07-30 DIAGNOSIS — Z79899 Other long term (current) drug therapy: Secondary | ICD-10-CM | POA: Diagnosis not present

## 2023-07-30 LAB — CMP (CANCER CENTER ONLY)
ALT: 18 U/L (ref 0–44)
AST: 20 U/L (ref 15–41)
Albumin: 3.8 g/dL (ref 3.5–5.0)
Alkaline Phosphatase: 93 U/L (ref 38–126)
Anion gap: 7 (ref 5–15)
BUN: 21 mg/dL (ref 8–23)
CO2: 26 mmol/L (ref 22–32)
Calcium: 9.1 mg/dL (ref 8.9–10.3)
Chloride: 106 mmol/L (ref 98–111)
Creatinine: 1.15 mg/dL (ref 0.61–1.24)
GFR, Estimated: >60 mL/min (ref >60)
Glucose, Bld: 94 mg/dL (ref 70–99)
Potassium: 3.8 mmol/L (ref 3.5–5.1)
Sodium: 139 mmol/L (ref 135–145)
Total Bilirubin: 0.7 mg/dL (ref 0.3–1.2)
Total Protein: 7.5 g/dL (ref 6.5–8.1)

## 2023-07-30 LAB — CBC WITH DIFFERENTIAL (CANCER CENTER ONLY)
Abs Immature Granulocytes: 0.03 10*3/uL (ref 0.00–0.07)
Basophils Absolute: 0 10*3/uL (ref 0.0–0.1)
Basophils Relative: 0 %
Eosinophils Absolute: 0.1 10*3/uL (ref 0.0–0.5)
Eosinophils Relative: 2 %
HCT: 38.8 % — ABNORMAL LOW (ref 39.0–52.0)
Hemoglobin: 13.1 g/dL (ref 13.0–17.0)
Immature Granulocytes: 0 %
Lymphocytes Relative: 8 %
Lymphs Abs: 0.6 10*3/uL — ABNORMAL LOW (ref 0.7–4.0)
MCH: 28.2 pg (ref 26.0–34.0)
MCHC: 33.8 g/dL (ref 30.0–36.0)
MCV: 83.4 fL (ref 80.0–100.0)
Monocytes Absolute: 0.7 10*3/uL (ref 0.1–1.0)
Monocytes Relative: 10 %
Neutro Abs: 5.6 10*3/uL (ref 1.7–7.7)
Neutrophils Relative %: 80 %
Platelet Count: 270 10*3/uL (ref 150–400)
RBC: 4.65 MIL/uL (ref 4.22–5.81)
RDW: 12.5 % (ref 11.5–15.5)
WBC Count: 7.1 10*3/uL (ref 4.0–10.5)
nRBC: 0 % (ref 0.0–0.2)

## 2023-07-30 LAB — TSH: TSH: 3.08 u[IU]/mL (ref 0.350–4.500)

## 2023-07-30 MED ORDER — SODIUM CHLORIDE 0.9% FLUSH
10.0000 mL | Freq: Once | INTRAVENOUS | Status: AC
  Administered 2023-07-30: 10 mL

## 2023-07-30 MED ORDER — SODIUM CHLORIDE 0.9 % IV SOLN
1500.0000 mg | Freq: Once | INTRAVENOUS | Status: AC
  Administered 2023-07-30: 1500 mg via INTRAVENOUS
  Filled 2023-07-30: qty 30

## 2023-07-30 MED ORDER — SODIUM CHLORIDE 0.9% FLUSH
10.0000 mL | INTRAVENOUS | Status: DC | PRN
  Administered 2023-07-30: 10 mL

## 2023-07-30 MED ORDER — PREDNISONE 50 MG PO TABS: ORAL_TABLET | ORAL | 0 refills | Status: DC

## 2023-07-30 MED ORDER — HEPARIN SOD (PORK) LOCK FLUSH 100 UNIT/ML IV SOLN
500.0000 [IU] | Freq: Once | INTRAVENOUS | Status: AC | PRN
  Administered 2023-07-30: 500 [IU]

## 2023-07-30 MED ORDER — SODIUM CHLORIDE 0.9 % IV SOLN: Freq: Once | INTRAVENOUS | Status: AC

## 2023-07-30 MED ORDER — DIPHENHYDRAMINE HCL 50 MG PO TABS: ORAL_TABLET | ORAL | 0 refills | Status: AC

## 2023-07-30 NOTE — Patient Instructions (Signed)

## 2023-07-30 NOTE — Progress Notes (Signed)
[facility] [phone]   [phone]  OFFICE PROGRESS NOTE  [name], [name], MD [address] [place] [address]  DIAGNOSIS:  1) Stage IIIb (T3, N2, M0) non-small cell lung cancer, squamous cell carcinoma presented with 2 separate right upper lobe lung nodule in addition to mediastinal lymphadenopathy diagnosed in January 2024. 2) left thyroid nodule.  PDL1 expression: 1%   PRIOR THERAPY: A course of concurrent chemoradiation with weekly carboplatin for AUC of 2 and paclitaxel 45 Mg/M2. Last dose on 02/05/23. Status post 6 cycles. Taxol changed to abraxane starting from cycle #4 due to reaction to taxol    CURRENT THERAPY: Consolidation immunotherapy with Imfinzi 1500 mg IV every 4 weeks.  First dose expected on 03/12/23.  Status post 5 cycles.  INTERVAL HISTORY: Alan Mendez 82 y.o. male returns to the clinic today for follow-up visit.  The patient is feeling fine today with no concerning complaints except for intermittent soreness in the throat.  He denied having any current chest pain, shortness of breath, cough or hemoptysis.  He has no nausea, vomiting, diarrhea or constipation.  He has no headache or visual changes.  He has no recent weight loss or night sweats.  He has been tolerating his treatment with Imfinzi fairly well.  He had a Port-A-Cath placed recently.  He is here for evaluation before starting cycle #6 of his treatment.  MEDICAL HISTORY: Past Medical History:  Diagnosis Date   Acquired solitary kidney    left --  s/p  right nephroureterctomy 01/ 2018   Anxiety    no current problems   Depression    no current problems   Elevated PSA    Enlarged lymph node 11/2022   enlarged right paratracheal lymph node.   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, negative margins)   History of kidney stones    passed stones and also surgery to remove   History of unilateral nephrectomy    01/ 2018  right nephroureterectomy for renal pelvis mass (carcinoma in situ)   HLD (hyperlipidemia)    on crestor   Hyperplasia of prostate without lower urinary tract symptoms (LUTS)    Lung cancer (HCC) 12/07/2022   Nephrolithiasis    bilateral non-obstructive   Pneumonia    x 1 - yrs ago   Pulmonary nodules 11/2022   2 right upper lobe pulmonary nodules   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HCC)    Renal cyst, acquired, right    right kidney removed   Wears glasses     ALLERGIES:  is allergic to paclitaxel, lamisil [terbinafine], and ivp dye [iodinated contrast media].  MEDICATIONS:  Current Outpatient Medications  Medication Sig Dispense Refill   cholecalciferol (VITAMIN D3) 25 MCG (1000 UNIT) tablet Take 1,000 Units by mouth daily.     lidocaine-prilocaine (EMLA) cream Apply small amount cream to port a cath site 30-60 minutes prior to accessing port 30 g 0   Multiple Vitamins-Minerals (MULTIVITAMIN WITH MINERALS) tablet Take 1 tablet by mouth daily. Centrum     prochlorperazine (COMPAZINE) 10 MG tablet Take 10 mg by mouth every 6 (six) hours as needed for nausea or vomiting. (Patient not taking: Reported on 01/15/2023)     rosuvastatin (CRESTOR)  10 MG tablet TAKE 1 TABLET EACH DAY. (Patient taking differently: Take 20 mg by mouth daily.) 30 tablet 0   silver sulfADIAZINE (SILVADENE) 1 % cream Apply 1 Application topically daily. 50 g 0   sucralfate (CARAFATE) 1 g tablet Take 1 tablet (1 g total) by mouth 4 (four) times daily -  with meals and at bedtime. Dissolve tablet in water -swish and swallow. 30 tablet 0   tamsulosin (FLOMAX) 0.4 MG CAPS capsule Take 1 capsule (0.4 mg total) by mouth daily. (Patient taking differently: Take 0.4 mg by mouth daily after breakfast.) 30 capsule 5   No current  facility-administered medications for this visit.    SURGICAL HISTORY:  Past Surgical History:  Procedure Laterality Date   APPENDECTOMY  child   BRONCHIAL BIOPSY  12/07/2022   Procedure: BRONCHIAL BIOPSIES;  Surgeon: Josephine Igo, DO;  Location: [facility];  Service: Pulmonary;;   BRONCHIAL BRUSHINGS  12/07/2022   Procedure: BRONCHIAL BRUSHINGS;  Surgeon: Josephine Igo, DO;  Location: [facility];  Service: Pulmonary;;   BRONCHIAL NEEDLE ASPIRATION BIOPSY  12/07/2022   Procedure: BRONCHIAL NEEDLE ASPIRATION BIOPSIES;  Surgeon: Josephine Igo, DO;  Location: [facility];  Service: Pulmonary;;   CATARACT EXTRACTION W/ INTRAOCULAR LENS  IMPLANT, BILATERAL  2017   COLONOSCOPY  08/15/2012   Tubular Adenoma, No high grade dysplasia or malignacy.   CYSTOSCOPY W/ RETROGRADES Right 09/25/2016   Procedure: CYSTOSCOPY, URETHRAL DILITATION  WITH RETROGRADE PYELOGRAM, BRUSH BIOPSIES OF RIGHT RENAL MASS;  Surgeon: Jethro Bolus, MD;  Location: [facility];  Service: Urology;  Laterality: Right;   CYSTOSCOPY W/ RETROGRADES Left 03/16/2017   Procedure: CYSTOSCOPY WITH RETROGRADE PYELOGRAM;  Surgeon: Crist Fat, MD;  Location: [facility];  Service: Urology;  Laterality: Left;   CYSTOSCOPY W/ URETERAL STENT REMOVAL Right 09/25/2016   Procedure: CYSTOSCOPY WITH STENT REMOVAL;  Surgeon: Jethro Bolus, MD;  Location: [facility];  Service: Urology;  Laterality: Right;   CYSTOSCOPY WITH RETROGRADE PYELOGRAM, URETEROSCOPY AND STENT PLACEMENT Right 08/21/2016   Procedure: CYSTOSCOPY WITH RIGHT RETROGRADE PYELOGRAM, RIGHT FLEXIBLE AND RIGID URETEROSCOPY, INSERTION DOUBLE J STENT RIGHT;  Surgeon: Jethro Bolus, MD;  Location: [facility];  Service: Urology;  Laterality: Right;   EXTRACORPOREAL SHOCK WAVE LITHOTRIPSY  2009   IR IMAGING GUIDED PORT INSERTION  07/09/2023   KNEE SURGERY Right 1980's   ROBOT ASSITED LAPAROSCOPIC  NEPHROURETERECTOMY Right 11/15/2016   Procedure: RIGHT XI ROBOT ASSITED LAPAROSCOPIC NEPHROURETERECTOMY;  Surgeon: Crist Fat, MD;  Location: [facility];  Service: Urology;  Laterality: Right;   TRANSURETHRAL RESECTION OF BLADDER TUMOR WITH MITOMYCIN-C Left 03/16/2017   Procedure: CYSTOSCOPY BIOPSIES OF BLADDER TUMOR WITH FULGURATION;  Surgeon: Crist Fat, MD;  Location: [facility];  Service: Urology;  Laterality: Left;   URETEROSCOPY Right 09/25/2016   Procedure: RIGHT URETEROSCOPY;  Surgeon: Jethro Bolus, MD;  Location: [facility];  Service: Urology;  Laterality: Right;   VIDEO BRONCHOSCOPY WITH ENDOBRONCHIAL ULTRASOUND Right 12/07/2022   Procedure: VIDEO BRONCHOSCOPY WITH ENDOBRONCHIAL ULTRASOUND;  Surgeon: Josephine Igo, DO;  Location: [facility];  Service: Pulmonary;  Laterality: Right;    REVIEW OF SYSTEMS:  A comprehensive review of systems was negative except for: Ears, nose, mouth, throat, and face: positive for sore throat   PHYSICAL EXAMINATION: General appearance: alert, cooperative, and no distress Head: Normocephalic, without obvious abnormality, atraumatic Neck: no adenopathy, no JVD, supple, symmetrical, trachea midline, and thyroid not enlarged, symmetric, no  tenderness/mass/nodules Lymph nodes: Cervical, supraclavicular, and axillary nodes normal. Resp: clear to auscultation bilaterally Back: symmetric, no curvature. ROM normal. No CVA tenderness. Cardio: regular rate and rhythm, S1, S2 normal, no murmur, click, rub or gallop GI: soft, non-tender; bowel sounds normal; no masses,  no organomegaly Extremities: extremities normal, atraumatic, no cyanosis or edema  ECOG PERFORMANCE STATUS: 1 - Symptomatic but completely ambulatory  Blood pressure 132/80, pulse 81, temperature 98.3 F (36.8 C), temperature source Oral, resp. rate 16, height 5\' 5"  (1.651 m), weight 139 lb 14.4 oz (63.5 kg), SpO2 100%.  LABORATORY DATA: Lab  Results  Component Value Date   WBC 7.1 07/30/2023   HGB 13.1 07/30/2023   HCT 38.8 (L) 07/30/2023   MCV 83.4 07/30/2023   PLT 270 07/30/2023      Chemistry      Component Value Date/Time   NA 139 07/02/2023 1107   K 3.9 07/02/2023 1107   CL 108 07/02/2023 1107   CO2 23 07/02/2023 1107   BUN 24 (H) 07/02/2023 1107   CREATININE 1.07 07/02/2023 1107   CREATININE 0.85 12/10/2014 1550      Component Value Date/Time   CALCIUM 9.5 07/02/2023 1107   ALKPHOS 84 07/02/2023 1107   AST 19 07/02/2023 1107   ALT 13 07/02/2023 1107   BILITOT 0.8 07/02/2023 1107       RADIOGRAPHIC STUDIES: IR IMAGING GUIDED PORT INSERTION  Result Date: 07/09/2023 INDICATION: 82 year old male with history of lung cancer requiring central venous access for chemotherapy administration. EXAM: IMPLANTED PORT A CATH PLACEMENT WITH ULTRASOUND AND FLUOROSCOPIC GUIDANCE COMPARISON:  None Available. MEDICATIONS: None. ANESTHESIA/SEDATION: Moderate (conscious) sedation was employed during this procedure. A total of Versed 2 mg and Fentanyl 100 mcg was administered intravenously. Moderate Sedation Time: 14 minutes. The patient's level of consciousness and vital signs were monitored continuously by radiology nursing throughout the procedure under my direct supervision. CONTRAST:  None FLUOROSCOPY TIME:  0.1 mGy COMPLICATIONS: None immediate. PROCEDURE: The procedure, risks, benefits, and alternatives were explained to the patient. Questions regarding the procedure were encouraged and answered. The patient understands and consents to the procedure. The right neck and chest were prepped with chlorhexidine in a sterile fashion, and a sterile drape was applied covering the operative field. Maximum barrier sterile technique with sterile gowns and gloves were used for the procedure. A timeout was performed prior to the initiation of the procedure. Ultrasound was used to examine the jugular vein which was compressible and free of  internal echoes. A skin marker was used to demarcate the planned venotomy and port pocket incision sites. Local anesthesia was provided to these sites and the subcutaneous tunnel track with 1% lidocaine with 1:100,000 epinephrine. A small incision was created at the jugular access site and blunt dissection was performed of the subcutaneous tissues. Under ultrasound guidance, the jugular vein was accessed with a 21 ga micropuncture needle and an 0.018" wire was inserted to the superior vena cava. Real-time ultrasound guidance was utilized for vascular access including the acquisition of a permanent ultrasound image documenting patency of the accessed vessel. A 5 Fr micopuncture set was then used, through which a 0.035" Rosen wire was passed under fluoroscopic guidance into the inferior vena cava. An 8 Fr dilator was then placed over the wire. A subcutaneous port pocket was then created along the upper chest wall utilizing a combination of sharp and blunt dissection. The pocket was irrigated with sterile saline, packed with gauze, and observed for hemorrhage. A single lumen plastic,  low profile power injectable port was chosen for placement. The 8 Fr catheter was tunneled from the port pocket site to the venotomy incision. The port was placed in the pocket. The external catheter was trimmed to appropriate length. The dilator was exchanged for an 8 Fr peel-away sheath under fluoroscopic guidance. The catheter was then placed through the sheath and the sheath was removed. Final catheter positioning was confirmed and documented with a fluoroscopic spot radiograph. The port was accessed with a Huber needle, aspirated, and flushed with heparinized saline. The deep dermal layer of the port pocket incision was closed with interrupted 3-0 Vicryl suture. The skin was opposed with a running subcuticular 4-0 Monocryl suture. Dermabond was then placed over the port pocket and neck incisions. The patient tolerated the procedure  well without immediate post procedural complication. FINDINGS: After catheter placement, the tip lies within the superior cavoatrial junction. The catheter aspirates and flushes normally and is ready for immediate use. IMPRESSION: Successful placement of a power injectable Port-A-Cath via the right internal jugular vein. The catheter is ready for immediate use. Marliss Coots, MD [facility] Electronically Signed   By: Marliss Coots M.D.   On: 07/09/2023 16:34    ASSESSMENT AND PLAN: This is a very pleasant 82 years old white male with Stage IIIb (T3, N2, M0) non-small cell lung cancer, squamous cell carcinoma presented with 2 separate right upper lobe lung nodule in addition to mediastinal lymphadenopathy diagnosed in January 2024. The patient has no evidence of metastatic disease to the brain or extrathoracic metastasis besides the 2 right upper lobe pulmonary nodule and mediastinal lymphadenopathy. I discussed his case with Dr. Dorris Fetch, cardiothoracic surgery and he indicated that because of the location of the lymph node, the patient will not be a great candidate for surgical resection with negative margin. The patient underwent a course of concurrent chemoradiation with weekly carboplatin for AUC of 2 and paclitaxel 45 Mg/M2.  Status post 6 cycles.  His treatment with paclitaxel was changed to Abraxane starting from cycle #3 with secondary to hypersensitivity reaction. He tolerated this treatment fairly well with partial response. He is currently undergoing consolidation treatment with immunotherapy with Imfinzi 1500 Mg IV every 4 weeks status post 5 cycles.   The patient has been tolerating this treatment well with no concerning adverse effects. I recommended for him to continue his current immunotherapy with Imfinzi and he will proceed with cycle #6 today. I will see him back for follow-up visit in 4 weeks for evaluation with repeat CT  scan of the chest for restaging of his disease.  He will have prednisone and Benadryl premedication before the scan. The patient was advised to call immediately if he has any other concerning symptoms in the interval. The patient voices understanding of current disease status and treatment options and is in agreement with the current care plan.  All questions were answered. The patient knows to call the clinic with any problems, questions or concerns. We can certainly see the patient much sooner if necessary.  The total time spent in the appointment was 20 minutes.  Disclaimer: This note was dictated with voice recognition software. Similar sounding words can inadvertently be transcribed and may not be corrected upon review.

## 2023-07-31 LAB — T4: T4, Total: 8.9 ug/dL (ref 4.5–12.0)

## 2023-08-02 ENCOUNTER — Encounter: Payer: Self-pay | Admitting: Internal Medicine

## 2023-08-06 DIAGNOSIS — Z23 Encounter for immunization: Secondary | ICD-10-CM | POA: Diagnosis not present

## 2023-08-21 ENCOUNTER — Ambulatory Visit (HOSPITAL_COMMUNITY)
Admission: RE | Admit: 2023-08-21 | Discharge: 2023-08-21 | Disposition: A | Discharge status: Home / Self Care | Payer: Medicare Other | Source: Ambulatory Visit | Attending: Internal Medicine | Admitting: Internal Medicine

## 2023-08-21 DIAGNOSIS — C3490 Malignant neoplasm of unspecified part of unspecified bronchus or lung: Secondary | ICD-10-CM | POA: Diagnosis not present

## 2023-08-21 MED ORDER — SODIUM CHLORIDE (PF) 0.9 % IJ SOLN
INTRAMUSCULAR | Status: AC
  Filled 2023-08-21: qty 50

## 2023-08-21 MED ORDER — IOHEXOL 300 MG/ML  SOLN
75.0000 mL | Freq: Once | INTRAMUSCULAR | Status: AC | PRN
  Administered 2023-08-21: 75 mL via INTRAVENOUS

## 2023-08-22 ENCOUNTER — Ambulatory Visit
Admission: RE | Admit: 2023-08-22 | Discharge: 2023-08-22 | Disposition: A | Discharge status: Home / Self Care | Payer: Medicare Other | Source: Ambulatory Visit | Attending: Internal Medicine | Admitting: Internal Medicine

## 2023-08-22 ENCOUNTER — Other Ambulatory Visit (HOSPITAL_COMMUNITY)
Admission: RE | Admit: 2023-08-22 | Discharge: 2023-08-22 | Disposition: A | Discharge status: Home / Self Care | Payer: Medicare Other | Source: Ambulatory Visit | Attending: Internal Medicine | Admitting: Internal Medicine

## 2023-08-22 DIAGNOSIS — E0789 Other specified disorders of thyroid: Secondary | ICD-10-CM | POA: Diagnosis not present

## 2023-08-22 DIAGNOSIS — E041 Nontoxic single thyroid nodule: Secondary | ICD-10-CM

## 2023-08-22 DIAGNOSIS — R896 Abnormal cytological findings in specimens from other organs, systems and tissues: Secondary | ICD-10-CM | POA: Insufficient documentation

## 2023-08-22 NOTE — Progress Notes (Signed)
[facility] OFFICE PROGRESS NOTE  [name], [name], MD [address] [place] [address]  DIAGNOSIS:  1) Stage IIIb (T3, N2, M0) non-small cell lung cancer, squamous cell carcinoma presented with 2 separate right upper lobe lung nodule in addition to mediastinal lymphadenopathy diagnosed in January 2024  2) left thyroid nodule.    PDL1 expression: 1%  PRIOR THERAPY: A course of concurrent chemoradiation with weekly carboplatin for AUC of 2 and paclitaxel 45 Mg/M2. Last dose on 02/05/23. Status post 6 cycles. Taxol changed to abraxane starting from cycle #4 due to reaction to taxol   CURRENT THERAPY: Consolidation immunotherapy with Imfinzi 1500 mg IV every 4 weeks.  First dose expected on 03/12/23.  Status post 6 cycles.   INTERVAL HISTORY: Alan Mendez 82 y.o. male returns to the clinic today for a follow-up visit.  The patient was last seen 1 month ago by Dr. Arbutus Ped.  The patient is feeling well today without any concerning complaints except he still is having some trouble swallowing certain foods since radiation. He does not always use carafate but states it does help. He also uses lemon lozenges.  He denies any fever, chills, night sweats, or unexplained weight loss.  He sometimes has cough since completing radiation. Denies any chest pain or hemoptysis.  Denies any nausea, vomiting, diarrhea, or constipation.  Denies any headache or visual changes.  He denies any rashes or skin changes.  He recently had a restaging CT scan performed.  He is here today for evaluation repeat blood work before undergoing cycle #7.    MEDICAL HISTORY: Past Medical History:  Diagnosis Date   Acquired solitary kidney    left --  s/p  right nephroureterctomy 01/ 2018   Anxiety    no current problems   Depression    no current problems   Elevated PSA    Enlarged lymph node 11/2022   enlarged right paratracheal lymph node.   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, negative margins)   History of kidney stones    passed stones and also surgery to remove   History of unilateral nephrectomy    01/ 2018  right nephroureterectomy for renal pelvis mass (carcinoma in situ)   HLD (hyperlipidemia)    on crestor   Hyperplasia of prostate without lower urinary tract symptoms (LUTS)    Lung cancer (HCC) 12/07/2022   Nephrolithiasis    bilateral non-obstructive   Pneumonia    x 1 - yrs ago   Pulmonary nodules 11/2022   2 right upper lobe pulmonary nodules   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HCC)    Renal cyst, acquired, right    right kidney removed   Wears glasses     ALLERGIES:  is allergic to paclitaxel, lamisil [terbinafine], and ivp dye [iodinated contrast media].  MEDICATIONS:  Current Outpatient Medications  Medication Sig Dispense Refill   cholecalciferol (VITAMIN D3) 25 MCG (1000 UNIT) tablet Take 1,000 Units by mouth daily.     diphenhydrAMINE (BENADRYL) 50 MG tablet Take 1 tablet 2 hours before the CT scan. 1 tablet 0   lidocaine-prilocaine (EMLA) cream Apply small amount cream to port a cath site 30-60 minutes prior to accessing port 30 g 0   Multiple Vitamins-Minerals (MULTIVITAMIN  WITH MINERALS) tablet Take 1 tablet by mouth daily. Centrum     predniSONE (DELTASONE) 50 MG tablet 1 tablet p.o. 14 hours then 7 hours then 2 hours before the scan. Please take Benadryl 50 mg p.o. x 1 2 hours before the scan with the last dose of prednisone. 3 tablet 0   prochlorperazine (COMPAZINE) 10 MG tablet Take 10 mg by mouth every 6 (six) hours as needed for nausea or vomiting. (Patient not taking: Reported on 01/15/2023)     rosuvastatin (CRESTOR) 10 MG tablet TAKE 1 TABLET EACH DAY. (Patient taking differently: Take 20 mg by mouth daily.) 30 tablet 0    silver sulfADIAZINE (SILVADENE) 1 % cream Apply 1 Application topically daily. 50 g 0   sucralfate (CARAFATE) 1 g tablet Take 1 tablet (1 g total) by mouth 4 (four) times daily -  with meals and at bedtime. Dissolve tablet in water -swish and swallow. 30 tablet 0   tamsulosin (FLOMAX) 0.4 MG CAPS capsule Take 1 capsule (0.4 mg total) by mouth daily. (Patient taking differently: Take 0.4 mg by mouth daily after breakfast.) 30 capsule 5   No current facility-administered medications for this visit.   Facility-Administered Medications Ordered in Other Visits  Medication Dose Route Frequency Provider Last Rate Last Admin   0.9 %  sodium chloride infusion   Intravenous Once Si Gaul, MD       durvalumab [facility]) 1,500 mg in sodium chloride 0.9 % 100 mL chemo infusion  1,500 mg Intravenous Once Si Gaul, MD       heparin lock flush 100 unit/mL  500 Units Intracatheter Once PRN Si Gaul, MD       sodium chloride flush (NS) 0.9 % injection 10 mL  10 mL Intracatheter PRN Si Gaul, MD        SURGICAL HISTORY:  Past Surgical History:  Procedure Laterality Date   APPENDECTOMY  child   BRONCHIAL BIOPSY  12/07/2022   Procedure: BRONCHIAL BIOPSIES;  Surgeon: Josephine Igo, DO;  Location: [facility];  Service: Pulmonary;;   BRONCHIAL BRUSHINGS  12/07/2022   Procedure: BRONCHIAL BRUSHINGS;  Surgeon: Josephine Igo, DO;  Location: [facility];  Service: Pulmonary;;   BRONCHIAL NEEDLE ASPIRATION BIOPSY  12/07/2022   Procedure: BRONCHIAL NEEDLE ASPIRATION BIOPSIES;  Surgeon: Josephine Igo, DO;  Location: [facility];  Service: Pulmonary;;   CATARACT EXTRACTION W/ INTRAOCULAR LENS  IMPLANT, BILATERAL  2017   COLONOSCOPY  08/15/2012   Tubular Adenoma, No high grade dysplasia or malignacy.   CYSTOSCOPY W/ RETROGRADES Right 09/25/2016   Procedure: CYSTOSCOPY, URETHRAL DILITATION  WITH RETROGRADE PYELOGRAM, BRUSH BIOPSIES OF RIGHT RENAL MASS;  Surgeon: Jethro Bolus,  MD;  Location: [facility];  Service: Urology;  Laterality: Right;   CYSTOSCOPY W/ RETROGRADES Left 03/16/2017   Procedure: CYSTOSCOPY WITH RETROGRADE PYELOGRAM;  Surgeon: Crist Fat, MD;  Location: [facility];  Service: Urology;  Laterality: Left;   CYSTOSCOPY W/ URETERAL STENT REMOVAL Right 09/25/2016   Procedure: CYSTOSCOPY WITH STENT REMOVAL;  Surgeon: Jethro Bolus, MD;  Location: [facility] [place];  Service: Urology;  Laterality: Right;   CYSTOSCOPY WITH RETROGRADE PYELOGRAM, URETEROSCOPY AND STENT PLACEMENT Right 08/21/2016   Procedure: CYSTOSCOPY WITH RIGHT RETROGRADE PYELOGRAM, RIGHT FLEXIBLE AND RIGID URETEROSCOPY, INSERTION DOUBLE J STENT RIGHT;  Surgeon: Jethro Bolus, MD;  Location: [facility];  Service: Urology;  Laterality: Right;   EXTRACORPOREAL SHOCK WAVE LITHOTRIPSY  2009   IR IMAGING GUIDED PORT INSERTION  07/09/2023  KNEE SURGERY Right 1980's   ROBOT ASSITED LAPAROSCOPIC NEPHROURETERECTOMY Right 11/15/2016   Procedure: RIGHT XI ROBOT ASSITED LAPAROSCOPIC NEPHROURETERECTOMY;  Surgeon: Crist Fat, MD;  Location: [facility];  Service: Urology;  Laterality: Right;   TRANSURETHRAL RESECTION OF BLADDER TUMOR WITH MITOMYCIN-C Left 03/16/2017   Procedure: CYSTOSCOPY BIOPSIES OF BLADDER TUMOR WITH FULGURATION;  Surgeon: Crist Fat, MD;  Location: [facility];  Service: Urology;  Laterality: Left;   URETEROSCOPY Right 09/25/2016   Procedure: RIGHT URETEROSCOPY;  Surgeon: Jethro Bolus, MD;  Location: [facility];  Service: Urology;  Laterality: Right;   VIDEO BRONCHOSCOPY WITH ENDOBRONCHIAL ULTRASOUND Right 12/07/2022   Procedure: VIDEO BRONCHOSCOPY WITH ENDOBRONCHIAL ULTRASOUND;  Surgeon: Josephine Igo, DO;  Location: [facility];  Service: Pulmonary;  Laterality: Right;    REVIEW OF SYSTEMS:   Review of Systems  Constitutional: Negative for appetite change, chills,  fatigue, fever and unexpected weight change.  HENT: Positive for occasional dysphagia. Negative for mouth sores, nosebleeds, and trouble swallowing.   Eyes: Negative for eye problems and icterus.  Respiratory: Positive for occasional cough. Negative for hemoptysis, shortness of breath and wheezing.   Cardiovascular: Negative for chest pain and leg swelling.  Gastrointestinal: Negative for abdominal pain, constipation, diarrhea, nausea and vomiting.  Genitourinary: Negative for bladder incontinence, difficulty urinating, dysuria, frequency and hematuria.   Musculoskeletal: Negative for back pain, gait problem, neck pain and neck stiffness.  Skin: Negative for itching and rash.  Neurological: Negative for dizziness, extremity weakness, gait problem, headaches, light-headedness and seizures.  Hematological: Negative for adenopathy. Does not bruise/bleed easily.  Psychiatric/Behavioral: Negative for confusion, depression and sleep disturbance. The patient is not nervous/anxious.     PHYSICAL EXAMINATION:  Blood pressure 127/78, pulse 84, temperature 98.4 F (36.9 C), temperature source Oral, resp. rate 16, weight 139 lb 6 oz (63.2 kg), SpO2 98%.  ECOG PERFORMANCE STATUS: 1  Physical Exam  Constitutional: Oriented to person, place, and time and well-developed, well-nourished, and in no distress.  HENT:  Head: Normocephalic and atraumatic.  Mouth/Throat: Oropharynx is clear and moist. No oropharyngeal exudate.  Eyes: Conjunctivae are normal. Right eye exhibits no discharge. Left eye exhibits no discharge. No scleral icterus.  Neck: Normal range of motion. Neck supple.  Cardiovascular: Normal rate, regular rhythm, normal heart sounds and intact distal pulses.   Pulmonary/Chest: Effort normal and breath sounds normal. No respiratory distress. No wheezes. No rales.  Abdominal: Soft. Bowel sounds are normal. Exhibits no distension and no mass. There is no tenderness.  Musculoskeletal: Normal  range of motion. Exhibits no edema.  Lymphadenopathy:    No cervical adenopathy.  Neurological: Alert and oriented to person, place, and time. Exhibits normal muscle tone. Gait normal. Coordination normal.  Skin: Skin is warm and dry. No rash noted. Not diaphoretic. No erythema. No pallor.  Psychiatric: Mood, memory and judgment normal.  Vitals reviewed.  LABORATORY DATA: Lab Results  Component Value Date   WBC 7.5 08/27/2023   HGB 12.3 (L) 08/27/2023   HCT 36.8 (L) 08/27/2023   MCV 83.3 08/27/2023   PLT 255 08/27/2023      Chemistry      Component Value Date/Time   NA 138 08/27/2023 1058   K 4.1 08/27/2023 1058   CL 106 08/27/2023 1058   CO2 26 08/27/2023 1058   BUN 24 (H) 08/27/2023 1058   CREATININE 1.03 08/27/2023 1058   CREATININE 0.85 12/10/2014 1550      Component Value Date/Time   CALCIUM 9.2 08/27/2023  1058   ALKPHOS 86 08/27/2023 1058   AST 19 08/27/2023 1058   ALT 19 08/27/2023 1058   BILITOT 0.7 08/27/2023 1058       RADIOGRAPHIC STUDIES:  CT Chest W Contrast  Result Date: 08/27/2023 CLINICAL DATA:  Non-small-cell lung cancer. Restaging. * Tracking Code: BO * EXAM: CT CHEST WITH CONTRAST TECHNIQUE: Multidetector CT imaging of the chest was performed during intravenous contrast administration. RADIATION DOSE REDUCTION: This exam was performed according to the departmental dose-optimization program which includes automated exposure control, adjustment of the mA and/or kV according to patient size and/or use of iterative reconstruction technique. CONTRAST:  75mL OMNIPAQUE IOHEXOL 300 MG/ML  SOLN COMPARISON:  05/29/2023 FINDINGS: Cardiovascular: The heart size is normal. No substantial pericardial effusion. Right Port-A-Cath tip is positioned at the SVC/RA junction. Mediastinum/Nodes: 19 mm short axis retrotracheal lymph node measured previously is proximally 2 cm today when measured in the same dimension. Fat plane between the esophagus and this lymph node is  obscured. No left hilar lymphadenopathy. Post treatment changes are noted in the right hilum. Lungs/Pleura: Interval evolution of right parahilar scarring/fibrosis. Small nodules in the medial suprahilar right lung previously have become incorporated into this more confluent soft tissue density showing a masslike nodular character measuring 4.3 x 1.7 cm on image 46/2. No new suspicious pulmonary nodule or mass in the left lung. No pleural effusion. Upper Abdomen: 14 mm calcified gallstone evident. Stable 9 mm low-density lesion medial segment left liver, likely a cyst. Small cyst upper pole left kidney is stable. Right kidney is surgically absent. Musculoskeletal: No worrisome lytic or sclerotic osseous abnormality. IMPRESSION: 1. Interval evolution of right parahilar scarring/fibrosis. Small nodules in the medial suprahilar right lung previously have become incorporated into this more confluent soft tissue density showing a masslike nodular character measuring 4.3 x 1.7 cm. While this masslike component may be related to confluent scarring, recurrent/residual disease is a concern. PET-CT may prove helpful to further evaluate. 2. Similar retrotracheal lymphadenopathy. 3. Cholelithiasis. Electronically Signed   By: Kennith Center M.D.   On: 08/27/2023 09:12   [place] FNA BX THYROID 1ST LESION AFIRMA  Result Date: 08/22/2023 INDICATION: Indeterminate thyroid nodule EXAM: ULTRASOUND GUIDED FINE NEEDLE ASPIRATION OF INDETERMINATE THYROID NODULE COMPARISON:  None Available. MEDICATIONS: None COMPLICATIONS: None immediate. TECHNIQUE: Informed written consent was obtained from the patient after a discussion of the risks, benefits and alternatives to treatment. Questions regarding the procedure were encouraged and answered. A timeout was performed prior to the initiation of the procedure. Pre-procedural ultrasound scanning demonstrated unchanged size and appearance of the indeterminate nodule within the left mid thyroid. The  procedure was planned. The neck was prepped in the usual sterile fashion, and a sterile drape was applied covering the operative field. A timeout was performed prior to the initiation of the procedure. Local anesthesia was provided with 1% lidocaine. Under direct ultrasound guidance, 5 FNA biopsies were performed of the left mid thyroid nodule with a 25 gauge needle. Multiple ultrasound images were saved for procedural documentation purposes. The samples were prepared and submitted to pathology. Limited post procedural scanning was negative for hematoma or additional complication. Dressings were placed. The patient tolerated the above procedures procedure well without immediate postprocedural complication. FINDINGS: Nodule reference number based on prior diagnostic ultrasound: 1 Maximum size: 2.1 cm Location: Left; Mid ACR TI-RADS risk category: TR4 (4-6 points) Reason for biopsy: meets ACR TI-RADS criteria Ultrasound imaging confirms appropriate placement of the needles within the thyroid nodule. IMPRESSION: Technically successful ultrasound  guided fine needle aspiration of left mid solid thyroid nodule (labeled 1, 2.1 cm, TI-RADS category 4). Marliss Coots, MD [facility] Electronically Signed   By: Marliss Coots M.D.   On: 08/22/2023 15:32     ASSESSMENT/PLAN:  This is a very pleasant 82  year old Caucasian male with Stage IIIb (T3, N2, M0) non-small cell lung cancer, squamous cell carcinoma presented with 2 separate right upper lobe lung nodule in addition to mediastinal lymphadenopathy diagnosed in January 2024. His PDL1 expression is 1%.   The patient completed a course of concurrent chemoradiation with weekly carboplatin for AUC of 2 and paclitaxel 45 Mg/M2.  Status post 6 cycles. Starting from cycle #4, the taxol was switched to abraxane due to an infusion reaction to taxol.  Patient's last chemotherapy was on 02/05/2023.   He is currently on  consolidation immunotherapy with Imfinzi 15 to milligrams every 4 weeks.  He is status post 6 cycles.  The patient was seen with Dr. Arbutus Ped.  The patient recently had a restaging CT scan.  Alan Mendez personally independently reviewed the scan and discussed the results the scan showed evolving radiation scarring and fibrosis. Due to this, the small nodules that were noted previously have become incorporated in the confluent soft tissue density. This area has a mass-like component which may be related to confluent scarring but cannot exclude recurrent disease. Therefore, we will order a PET scan to further evaluation. I have placed the order.   Labs were reviewed.  Recommend that he proceed with cycle #7 today as scheduled.  We will see him back for follow-up visit in 4 weeks for evaluation and repeat blood work before undergoing cycle #8.  He will continue to use carafate if needed for the swallowing. If this does not improve, I let the patient know next step may be seeing GI. He may consider PPI as well.   The patient was advised to call immediately if she has any concerning symptoms in the interval. The patient voices understanding of current disease status and treatment options and is in agreement with the current care plan. All questions were answered. The patient knows to call the clinic with any problems, questions or concerns. We can certainly see the patient much sooner if necessary    Orders Placed This Encounter  Procedures   NM PET Image Restag (PS) Skull Base To Thigh    Standing Status:   Future    Standing Expiration Date:   08/26/2024    Order Specific Question:   If indicated for the ordered procedure, I authorize the administration of a radiopharmaceutical per Radiology protocol    Answer:   Yes    Order Specific Question:   Preferred imaging location?    Answer:   [facility]     Alan Vivar L Mariette Cowley, PA-C 08/27/23  ADDENDUM: Hematology/Oncology Attending: I had a  face-to-face encounter with the patient today.  I reviewed his record, lab, scan and recommended his care plan.  This is a very pleasant 82 years old white male with stage IIIb (T3, N2, M0) non-small cell lung cancer, squamous cell carcinoma diagnosed in January 2024 with PD-L1 expression of 1%.  The patient underwent a course of concurrent chemoradiation and he is currently undergoing consolidation treatment with immunotherapy with Imfinzi 1500 Mg IV every 4 weeks status post 6 cycles.  He has been tolerating his treatment with Imfinzi fairly well with no concerning adverse effects. He had fine-needle aspiration of the  suspicious left thyroid nodule recently that was unremarkable for any malignancy. The patient had repeat CT scan of the chest performed recently.  I personally and independently reviewed the scan images and discussed the result with the patient today.  His scan showed interval evolution of right perihilar scarring and fibrosis with small nodules in the medial suprahilar region previously have been incorporated into this more confluent soft tissue density showing a masslike nodular character measuring 4.3 x 1.7 cm still suspicious for confluent scarring but recurrent/residual disease was not excluded and a PET scan was recommended for further evaluation.  He also has similar retrotracheal lymphadenopathy. I discussed the imaging studies with the patient I recommended for him to have a PET scan performed in 3 weeks before his upcoming visit for the treatment in 4 weeks.  He will proceed with cycle #7 of his consolidation treatment with immunotherapy today. The patient was advised to call immediately if he has any other concerning symptoms in the interval. The total time spent in the appointment was 30 minutes. Disclaimer: This note was dictated with voice recognition software. Similar sounding words can inadvertently be transcribed and may be missed upon review. Lajuana Matte, MD

## 2023-08-23 DIAGNOSIS — Z23 Encounter for immunization: Secondary | ICD-10-CM | POA: Diagnosis not present

## 2023-08-24 LAB — CYTOLOGY - NON PAP

## 2023-08-25 ENCOUNTER — Other Ambulatory Visit: Payer: Self-pay

## 2023-08-27 ENCOUNTER — Inpatient Hospital Stay (HOSPITAL_BASED_OUTPATIENT_CLINIC_OR_DEPARTMENT_OTHER): Payer: Medicare Other | Admitting: Physician Assistant

## 2023-08-27 ENCOUNTER — Inpatient Hospital Stay: Payer: Medicare Other | Attending: Internal Medicine

## 2023-08-27 ENCOUNTER — Inpatient Hospital Stay: Payer: Medicare Other

## 2023-08-27 VITALS — BP 127/78 | HR 84 | Temp 98.4°F | Resp 16 | Wt 139.4 lb

## 2023-08-27 DIAGNOSIS — Z5112 Encounter for antineoplastic immunotherapy: Secondary | ICD-10-CM | POA: Insufficient documentation

## 2023-08-27 DIAGNOSIS — C3491 Malignant neoplasm of unspecified part of right bronchus or lung: Secondary | ICD-10-CM | POA: Diagnosis not present

## 2023-08-27 DIAGNOSIS — C3490 Malignant neoplasm of unspecified part of unspecified bronchus or lung: Secondary | ICD-10-CM

## 2023-08-27 DIAGNOSIS — E041 Nontoxic single thyroid nodule: Secondary | ICD-10-CM | POA: Diagnosis not present

## 2023-08-27 DIAGNOSIS — C679 Malignant neoplasm of bladder, unspecified: Secondary | ICD-10-CM

## 2023-08-27 DIAGNOSIS — C3411 Malignant neoplasm of upper lobe, right bronchus or lung: Secondary | ICD-10-CM | POA: Diagnosis not present

## 2023-08-27 DIAGNOSIS — Z95828 Presence of other vascular implants and grafts: Secondary | ICD-10-CM

## 2023-08-27 LAB — CBC WITH DIFFERENTIAL (CANCER CENTER ONLY)
Abs Immature Granulocytes: 0.09 10*3/uL — ABNORMAL HIGH (ref 0.00–0.07)
Basophils Absolute: 0 10*3/uL (ref 0.0–0.1)
Basophils Relative: 0 %
Eosinophils Absolute: 0.2 10*3/uL (ref 0.0–0.5)
Eosinophils Relative: 2 %
HCT: 36.8 % — ABNORMAL LOW (ref 39.0–52.0)
Hemoglobin: 12.3 g/dL — ABNORMAL LOW (ref 13.0–17.0)
Immature Granulocytes: 1 %
Lymphocytes Relative: 10 %
Lymphs Abs: 0.8 10*3/uL (ref 0.7–4.0)
MCH: 27.8 pg (ref 26.0–34.0)
MCHC: 33.4 g/dL (ref 30.0–36.0)
MCV: 83.3 fL (ref 80.0–100.0)
Monocytes Absolute: 0.8 10*3/uL (ref 0.1–1.0)
Monocytes Relative: 11 %
Neutro Abs: 5.6 10*3/uL (ref 1.7–7.7)
Neutrophils Relative %: 76 %
Platelet Count: 255 10*3/uL (ref 150–400)
RBC: 4.42 MIL/uL (ref 4.22–5.81)
RDW: 13.2 % (ref 11.5–15.5)
WBC Count: 7.5 10*3/uL (ref 4.0–10.5)
nRBC: 0 % (ref 0.0–0.2)

## 2023-08-27 LAB — CMP (CANCER CENTER ONLY)
ALT: 19 U/L (ref 0–44)
AST: 19 U/L (ref 15–41)
Albumin: 3.7 g/dL (ref 3.5–5.0)
Alkaline Phosphatase: 86 U/L (ref 38–126)
Anion gap: 6 (ref 5–15)
BUN: 24 mg/dL — ABNORMAL HIGH (ref 8–23)
CO2: 26 mmol/L (ref 22–32)
Calcium: 9.2 mg/dL (ref 8.9–10.3)
Chloride: 106 mmol/L (ref 98–111)
Creatinine: 1.03 mg/dL (ref 0.61–1.24)
GFR, Estimated: >60 mL/min (ref >60)
Glucose, Bld: 97 mg/dL (ref 70–99)
Potassium: 4.1 mmol/L (ref 3.5–5.1)
Sodium: 138 mmol/L (ref 135–145)
Total Bilirubin: 0.7 mg/dL (ref 0.3–1.2)
Total Protein: 7.2 g/dL (ref 6.5–8.1)

## 2023-08-27 MED ORDER — SODIUM CHLORIDE 0.9% FLUSH
10.0000 mL | Freq: Once | INTRAVENOUS | Status: AC
  Administered 2023-08-27: 10 mL

## 2023-08-27 MED ORDER — SODIUM CHLORIDE 0.9 % IV SOLN: Freq: Once | INTRAVENOUS | Status: AC

## 2023-08-27 MED ORDER — SODIUM CHLORIDE 0.9 % IV SOLN
1500.0000 mg | Freq: Once | INTRAVENOUS | Status: AC
  Administered 2023-08-27: 1500 mg via INTRAVENOUS
  Filled 2023-08-27: qty 30

## 2023-08-27 MED ORDER — HEPARIN SOD (PORK) LOCK FLUSH 100 UNIT/ML IV SOLN
500.0000 [IU] | Freq: Once | INTRAVENOUS | Status: AC | PRN
  Administered 2023-08-27: 500 [IU]

## 2023-08-27 MED ORDER — SODIUM CHLORIDE 0.9% FLUSH
10.0000 mL | INTRAVENOUS | Status: DC | PRN
  Administered 2023-08-27: 10 mL

## 2023-08-27 NOTE — Patient Instructions (Signed)
[facility]  Discharge Instructions: Thank you for choosing [facility] to provide your oncology and hematology care.   If you have a lab appointment with the Cancer Center, please go directly to the Cancer Center and check in at the registration area.   Wear comfortable clothing and clothing appropriate for easy access to any Portacath or PICC line.   We strive to give you quality time with your provider. You may need to reschedule your appointment if you arrive late (15 or more minutes).  Arriving late affects you and other patients whose appointments are after yours.  Also, if you miss three or more appointments without notifying the office, you may be dismissed from the clinic at the provider's discretion.      For prescription refill requests, have your pharmacy contact our office and allow 72 hours for refills to be completed.    Today you received the following chemotherapy and/or immunotherapy agents: Imfinzi      To help prevent nausea and vomiting after your treatment, we encourage you to take your nausea medication as directed.  BELOW ARE SYMPTOMS THAT SHOULD BE REPORTED IMMEDIATELY: *FEVER GREATER THAN 100.4 F (38 C) OR HIGHER *CHILLS OR SWEATING *NAUSEA AND VOMITING THAT IS NOT CONTROLLED WITH YOUR NAUSEA MEDICATION *UNUSUAL SHORTNESS OF BREATH *UNUSUAL BRUISING OR BLEEDING *URINARY PROBLEMS (pain or burning when urinating, or frequent urination) *BOWEL PROBLEMS (unusual diarrhea, constipation, pain near the anus) TENDERNESS IN MOUTH AND THROAT WITH OR WITHOUT PRESENCE OF ULCERS (sore throat, sores in mouth, or a toothache) UNUSUAL RASH, SWELLING OR PAIN  UNUSUAL VAGINAL DISCHARGE OR ITCHING   Items with * indicate a potential emergency and should be followed up as soon as possible or go to the Emergency Department if any problems should occur.  Please show the CHEMOTHERAPY ALERT CARD or IMMUNOTHERAPY ALERT CARD at  check-in to the Emergency Department and triage nurse.  Should you have questions after your visit or need to cancel or reschedule your appointment, please contact [facility]  Dept: [phone]  and follow the prompts.  Office hours are 8:00 a.m. to 4:30 p.m. Monday - Friday. Please note that voicemails left after 4:00 p.m. may not be returned until the following business day.  We are closed weekends and major holidays. You have access to a nurse at all times for urgent questions. Please call the main number to the clinic Dept: [phone] and follow the prompts.   For any non-urgent questions, you may also contact your provider using MyChart. We now offer e-Visits for anyone 59 and older to request care online for non-urgent symptoms. For details visit [email or web address].   Also download the MyChart app! Go to the app store, search "MyChart", open the app, select [facility], and log in with your MyChart username and password.

## 2023-08-28 ENCOUNTER — Encounter: Payer: Self-pay | Admitting: Internal Medicine

## 2023-08-30 ENCOUNTER — Encounter: Payer: Self-pay | Admitting: Medical Oncology

## 2023-09-04 ENCOUNTER — Encounter (HOSPITAL_COMMUNITY): Payer: Self-pay

## 2023-09-11 ENCOUNTER — Encounter (HOSPITAL_COMMUNITY)
Admission: RE | Admit: 2023-09-11 | Discharge: 2023-09-11 | Disposition: A | Discharge status: Home / Self Care | Payer: Medicare Other | Source: Ambulatory Visit | Attending: Physician Assistant | Admitting: Physician Assistant

## 2023-09-11 DIAGNOSIS — C3490 Malignant neoplasm of unspecified part of unspecified bronchus or lung: Secondary | ICD-10-CM | POA: Diagnosis not present

## 2023-09-11 DIAGNOSIS — C3411 Malignant neoplasm of upper lobe, right bronchus or lung: Secondary | ICD-10-CM | POA: Insufficient documentation

## 2023-09-11 DIAGNOSIS — I700 Atherosclerosis of aorta: Secondary | ICD-10-CM | POA: Diagnosis not present

## 2023-09-11 DIAGNOSIS — C679 Malignant neoplasm of bladder, unspecified: Secondary | ICD-10-CM | POA: Diagnosis not present

## 2023-09-11 DIAGNOSIS — R896 Abnormal cytological findings in specimens from other organs, systems and tissues: Secondary | ICD-10-CM | POA: Insufficient documentation

## 2023-09-11 LAB — GLUCOSE, CAPILLARY: Glucose-Capillary: 92 mg/dL (ref 70–99)

## 2023-09-11 MED ORDER — FLUDEOXYGLUCOSE F - 18 (FDG) INJECTION
6.9000 | Freq: Once | INTRAVENOUS | Status: AC
  Administered 2023-09-11: 7 via INTRAVENOUS

## 2023-09-13 ENCOUNTER — Other Ambulatory Visit: Payer: Self-pay

## 2023-09-19 NOTE — Progress Notes (Unsigned)
[facility] OFFICE PROGRESS NOTE  [name], [name], MD [address] [place] [address]  DIAGNOSIS: 1) Stage IIIb (T3, N2, M0) non-small cell lung cancer, squamous cell carcinoma presented with 2 separate right upper lobe lung nodule in addition to mediastinal lymphadenopathy diagnosed in January 2024  2) left thyroid nodule.    PDL1 expression: 1%  PRIOR THERAPY:  1) A course of concurrent chemoradiation with weekly carboplatin for AUC of 2 and paclitaxel 45 Mg/M2. Last dose on 02/05/23. Status post 6 cycles. Taxol changed to abraxane starting from cycle #4 due to reaction to taxol  2) Consolidation immunotherapy with Imfinzi 1500 mg IV every 4 weeks.  First dose on 03/12/23.  Status post 7 cycles. Discontinued due to persistent disease   CURRENT THERAPY: Referral to cardiothoracic surgeon at [facility]  INTERVAL HISTORY: KYRI CARPIO 82 y.o. male returns to the clinic today for a follow-up visit.  The patient was last seen 1 month ago by Dr. Arbutus Ped and myself.  The patient is feeling well today without any concerning complaints except he is nervous about his scan results.  He had a CT scan performed at his last appointment and had a PET scan to further evaluate this for possible local recurrence.    He was some trouble swallowing certain foods since radiation but did mention that this is improving.  He uses Carafate needs a refill.  The patient also reports a decrease in stamina, particularly noticeable during road biking, which they attribute to either the throat issue or potential scarring from radiation therapy. They deny any significant struggle with breathing, but acknowledge a change in lung function post-radiation. The patient also reports a cough, which has improved, but is still present with some phlegm. The cough is triggered by talking a lot or dehydration, but is no longer painful.  he patient denies any fevers, infections, night sweats, nausea, vomiting,  diarrhea, constipation, unusual rashes or skin changes, and headaches or vision changes. They also deny any swelling. The patient has been active, recently [place].  He is here today for evaluation repeat blood work before undergoing cycle #8.    MEDICAL HISTORY: Past Medical History:  Diagnosis Date   Acquired solitary kidney    left --  s/p  right nephroureterctomy 01/ 2018   Anxiety    no current problems   Depression    no current problems   Elevated PSA    Enlarged lymph node 11/2022   enlarged right paratracheal lymph node.   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, negative margins)   History of kidney stones    passed stones and also surgery to remove   History of unilateral nephrectomy    01/ 2018  right nephroureterectomy for renal pelvis mass (carcinoma in situ)   HLD (hyperlipidemia)    on crestor   Hyperplasia of prostate without lower urinary tract symptoms (LUTS)    Lung cancer (HCC) 12/07/2022   Nephrolithiasis    bilateral non-obstructive   Pneumonia    x 1 - yrs ago   Pulmonary nodules 11/2022   2 right upper lobe pulmonary nodules   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HCC)    Renal cyst, acquired, right    right kidney  removed   Wears glasses     ALLERGIES:  is allergic to paclitaxel, lamisil [terbinafine], and ivp dye [iodinated contrast media].  MEDICATIONS:  Current Outpatient Medications  Medication Sig Dispense Refill   cholecalciferol (VITAMIN D3) 25 MCG (1000 UNIT) tablet Take 1,000 Units by mouth daily.     diphenhydrAMINE (BENADRYL) 50 MG tablet Take 1 tablet 2 hours before the CT scan. 1 tablet 0   lidocaine-prilocaine (EMLA) cream Apply small amount cream to port a cath site 30-60 minutes prior  to accessing port 30 g 0   Multiple Vitamins-Minerals (MULTIVITAMIN WITH MINERALS) tablet Take 1 tablet by mouth daily. Centrum     predniSONE (DELTASONE) 50 MG tablet 1 tablet p.o. 14 hours then 7 hours then 2 hours before the scan. Please take Benadryl 50 mg p.o. x 1 2 hours before the scan with the last dose of prednisone. 3 tablet 0   prochlorperazine (COMPAZINE) 10 MG tablet Take 10 mg by mouth every 6 (six) hours as needed for nausea or vomiting. (Patient not taking: Reported on 01/15/2023)     rosuvastatin (CRESTOR) 10 MG tablet TAKE 1 TABLET EACH DAY. (Patient taking differently: Take 20 mg by mouth daily.) 30 tablet 0   silver sulfADIAZINE (SILVADENE) 1 % cream Apply 1 Application topically daily. 50 g 0   sucralfate (CARAFATE) 1 g tablet Take 1 tablet (1 g total) by mouth 4 (four) times daily -  with meals and at bedtime. Dissolve tablet in water -swish and swallow. 30 tablet 0   tamsulosin (FLOMAX) 0.4 MG CAPS capsule Take 1 capsule (0.4 mg total) by mouth daily. (Patient taking differently: Take 0.4 mg by mouth daily after breakfast.) 30 capsule 5   No current facility-administered medications for this visit.    SURGICAL HISTORY:  Past Surgical History:  Procedure Laterality Date   APPENDECTOMY  child   BRONCHIAL BIOPSY  12/07/2022   Procedure: BRONCHIAL BIOPSIES;  Surgeon: Josephine Igo, DO;  Location: [facility];  Service: Pulmonary;;   BRONCHIAL BRUSHINGS  12/07/2022   Procedure: BRONCHIAL BRUSHINGS;  Surgeon: Josephine Igo, DO;  Location: [facility];  Service: Pulmonary;;   BRONCHIAL NEEDLE ASPIRATION BIOPSY  12/07/2022   Procedure: BRONCHIAL NEEDLE ASPIRATION BIOPSIES;  Surgeon: Josephine Igo, DO;  Location: [facility];  Service: Pulmonary;;   CATARACT EXTRACTION W/ INTRAOCULAR LENS  IMPLANT, BILATERAL  2017   COLONOSCOPY  08/15/2012   Tubular Adenoma, No high grade dysplasia or malignacy.   CYSTOSCOPY W/ RETROGRADES Right 09/25/2016   Procedure: CYSTOSCOPY,  URETHRAL DILITATION  WITH RETROGRADE PYELOGRAM, BRUSH BIOPSIES OF RIGHT RENAL MASS;  Surgeon: Jethro Bolus, MD;  Location: [facility];  Service: Urology;  Laterality: Right;   CYSTOSCOPY W/ RETROGRADES Left 03/16/2017   Procedure: CYSTOSCOPY WITH RETROGRADE PYELOGRAM;  Surgeon: Crist Fat, MD;  [facility];  Service: Urology;  Laterality: Left;   CYSTOSCOPY W/ URETERAL STENT REMOVAL Right 09/25/2016   Procedure: CYSTOSCOPY WITH STENT REMOVAL;  Surgeon: Jethro Bolus, MD;  Location: [facility];  Service: Urology;  Laterality: Right;   CYSTOSCOPY WITH RETROGRADE PYELOGRAM, URETEROSCOPY AND STENT PLACEMENT Right 08/21/2016   Procedure: CYSTOSCOPY WITH RIGHT RETROGRADE PYELOGRAM, RIGHT FLEXIBLE AND RIGID URETEROSCOPY, INSERTION DOUBLE J STENT RIGHT;  Surgeon: Jethro Bolus, MD;  Location: [facility];  Service: Urology;  Laterality: Right;   EXTRACORPOREAL SHOCK WAVE LITHOTRIPSY  2009   IR IMAGING GUIDED PORT INSERTION  07/09/2023   KNEE SURGERY Right 1980's  ROBOT ASSITED LAPAROSCOPIC NEPHROURETERECTOMY Right 11/15/2016   Procedure: RIGHT XI ROBOT ASSITED LAPAROSCOPIC NEPHROURETERECTOMY;  Surgeon: Crist Fat, MD;  Location: [facility];  Service: Urology;  Laterality: Right;   TRANSURETHRAL RESECTION OF BLADDER TUMOR WITH MITOMYCIN-C Left 03/16/2017   Procedure: CYSTOSCOPY BIOPSIES OF BLADDER TUMOR WITH FULGURATION;  Surgeon: Crist Fat, MD;  Location: [facility];  Service: Urology;  Laterality: Left;   URETEROSCOPY Right 09/25/2016   Procedure: RIGHT URETEROSCOPY;  Surgeon: Jethro Bolus, MD;  Location: [facility];  Service: Urology;  Laterality: Right;   VIDEO BRONCHOSCOPY WITH ENDOBRONCHIAL ULTRASOUND Right 12/07/2022   Procedure: VIDEO BRONCHOSCOPY WITH ENDOBRONCHIAL ULTRASOUND;  Surgeon: Josephine Igo, DO;  Location: [facility];  Service: Pulmonary;   Laterality: Right;    REVIEW OF SYSTEMS:   Review of Systems  Constitutional: Negative for appetite change, chills, fatigue, fever and unexpected weight change.  HENT: Positive for occasional dysphagia (improving). Negative for mouth sores, nosebleeds, and trouble swallowing.   Eyes: Negative for eye problems and icterus.  Respiratory: Positive for improving occasional cough. Negative for hemoptysis, shortness of breath and wheezing.   Cardiovascular: Negative for chest pain and leg swelling.  Gastrointestinal: Negative for abdominal pain, constipation, diarrhea, nausea and vomiting.  Genitourinary: Negative for bladder incontinence, difficulty urinating, dysuria, frequency and hematuria.   Musculoskeletal: Negative for back pain, gait problem, neck pain and neck stiffness.  Skin: Negative for itching and rash.  Neurological: Negative for dizziness, extremity weakness, gait problem, headaches, light-headedness and seizures.  Hematological: Negative for adenopathy. Does not bruise/bleed easily.  Psychiatric/Behavioral: Negative for confusion, depression and sleep disturbance. The patient is not nervous/anxious.   PHYSICAL EXAMINATION:  There were no vitals taken for this visit.  ECOG PERFORMANCE STATUS: 1  Physical Exam  Constitutional: Oriented to person, place, and time and well-developed, well-nourished, and in no distress.  HENT:  Head: Normocephalic and atraumatic.  Mouth/Throat: Oropharynx is clear and moist. No oropharyngeal exudate.  Eyes: Conjunctivae are normal. Right eye exhibits no discharge. Left eye exhibits no discharge. No scleral icterus.  Neck: Normal range of motion. Neck supple.  Cardiovascular: Normal rate, regular rhythm, normal heart sounds and intact distal pulses.   Pulmonary/Chest: Effort normal and breath sounds normal. No respiratory distress. No wheezes. No rales.  Abdominal: Soft. Bowel sounds are normal. Exhibits no distension and no mass. There is no  tenderness.  Musculoskeletal: Normal range of motion. Exhibits no edema.  Lymphadenopathy:    No cervical adenopathy.  Neurological: Alert and oriented to person, place, and time. Exhibits normal muscle tone. Gait normal. Coordination normal.  Skin: Skin is warm and dry. No rash noted. Not diaphoretic. No erythema. No pallor.  Psychiatric: Mood, memory and judgment normal.  Vitals reviewed.  LABORATORY DATA: Lab Results  Component Value Date   WBC 7.5 08/27/2023   HGB 12.3 (L) 08/27/2023   HCT 36.8 (L) 08/27/2023   MCV 83.3 08/27/2023   PLT 255 08/27/2023      Chemistry      Component Value Date/Time   NA 138 08/27/2023 1058   K 4.1 08/27/2023 1058   CL 106 08/27/2023 1058   CO2 26 08/27/2023 1058   BUN 24 (H) 08/27/2023 1058   CREATININE 1.03 08/27/2023 1058   CREATININE 0.85 12/10/2014 1550      Component Value Date/Time   CALCIUM 9.2 08/27/2023 1058   ALKPHOS 86 08/27/2023 1058   AST 19 08/27/2023 1058   ALT 19 08/27/2023 1058   BILITOT 0.7  08/27/2023 1058       RADIOGRAPHIC STUDIES:  CT Chest W Contrast  Result Date: 08/27/2023 CLINICAL DATA:  Non-small-cell lung cancer. Restaging. * Tracking Code: BO * EXAM: CT CHEST WITH CONTRAST TECHNIQUE: Multidetector CT imaging of the chest was performed during intravenous contrast administration. RADIATION DOSE REDUCTION: This exam was performed according to the departmental dose-optimization program which includes automated exposure control, adjustment of the mA and/or kV according to patient size and/or use of iterative reconstruction technique. CONTRAST:  75mL OMNIPAQUE IOHEXOL 300 MG/ML  SOLN COMPARISON:  05/29/2023 FINDINGS: Cardiovascular: The heart size is normal. No substantial pericardial effusion. Right Port-A-Cath tip is positioned at the SVC/RA junction. Mediastinum/Nodes: 19 mm short axis retrotracheal lymph node measured previously is proximally 2 cm today when measured in the same dimension. Fat plane between the  esophagus and this lymph node is obscured. No left hilar lymphadenopathy. Post treatment changes are noted in the right hilum. Lungs/Pleura: Interval evolution of right parahilar scarring/fibrosis. Small nodules in the medial suprahilar right lung previously have become incorporated into this more confluent soft tissue density showing a masslike nodular character measuring 4.3 x 1.7 cm on image 46/2. No new suspicious pulmonary nodule or mass in the left lung. No pleural effusion. Upper Abdomen: 14 mm calcified gallstone evident. Stable 9 mm low-density lesion medial segment left liver, likely a cyst. Small cyst upper pole left kidney is stable. Right kidney is surgically absent. Musculoskeletal: No worrisome lytic or sclerotic osseous abnormality. IMPRESSION: 1. Interval evolution of right parahilar scarring/fibrosis. Small nodules in the medial suprahilar right lung previously have become incorporated into this more confluent soft tissue density showing a masslike nodular character measuring 4.3 x 1.7 cm. While this masslike component may be related to confluent scarring, recurrent/residual disease is a concern. PET-CT may prove helpful to further evaluate. 2. Similar retrotracheal lymphadenopathy. 3. Cholelithiasis. Electronically Signed   By: Kennith Center M.D.   On: 08/27/2023 09:12   [place] FNA BX THYROID 1ST LESION AFIRMA  Result Date: 08/22/2023 INDICATION: Indeterminate thyroid nodule EXAM: ULTRASOUND GUIDED FINE NEEDLE ASPIRATION OF INDETERMINATE THYROID NODULE COMPARISON:  None Available. MEDICATIONS: None COMPLICATIONS: None immediate. TECHNIQUE: Informed written consent was obtained from the patient after a discussion of the risks, benefits and alternatives to treatment. Questions regarding the procedure were encouraged and answered. A timeout was performed prior to the initiation of the procedure. Pre-procedural ultrasound scanning demonstrated unchanged size and appearance of the indeterminate nodule  within the left mid thyroid. The procedure was planned. The neck was prepped in the usual sterile fashion, and a sterile drape was applied covering the operative field. A timeout was performed prior to the initiation of the procedure. Local anesthesia was provided with 1% lidocaine. Under direct ultrasound guidance, 5 FNA biopsies were performed of the left mid thyroid nodule with a 25 gauge needle. Multiple ultrasound images were saved for procedural documentation purposes. The samples were prepared and submitted to pathology. Limited post procedural scanning was negative for hematoma or additional complication. Dressings were placed. The patient tolerated the above procedures procedure well without immediate postprocedural complication. FINDINGS: Nodule reference number based on prior diagnostic ultrasound: 1 Maximum size: 2.1 cm Location: Left; Mid ACR TI-RADS risk category: TR4 (4-6 points) Reason for biopsy: meets ACR TI-RADS criteria Ultrasound imaging confirms appropriate placement of the needles within the thyroid nodule. IMPRESSION: Technically successful ultrasound guided fine needle aspiration of left mid solid thyroid nodule (labeled 1, 2.1 cm, TI-RADS category 4). Marliss Coots, MD [facility] Electronically Signed   By: Marliss Coots M.D.   On: 08/22/2023 15:32     ASSESSMENT/PLAN:  This is a very pleasant 82  year old Caucasian male with Stage IIIb (T3, N2, M0) non-small cell lung cancer, squamous cell carcinoma presented with 2 separate right upper lobe lung nodule in addition to mediastinal lymphadenopathy diagnosed in January 2024. His PDL1 expression is 1%.    The patient completed a course of concurrent chemoradiation with weekly carboplatin for AUC of 2 and paclitaxel 45 Mg/M2.  Status post 6 cycles. Starting from cycle #4, the taxol was switched to abraxane due to an infusion reaction to taxol.  Patient's last chemotherapy was on  02/05/2023.    He is currently on consolidation immunotherapy with Imfinzi 15 to milligrams every 4 weeks.  He is status post 7 cycles.  At his last appointment, his scan showed interval evolution of right parahilar scarring/fibrosis. Small nodules in the medial suprahilar right lung previously have become incorporated into this more confluent soft tissue density showing a masslike nodular character measuring 4.3 x 1.7 cm. While this masslike component may be related to confluent scarring, recurrent/residual disease is a concern.  Dr. Arbutus Ped recommended a PET scan. The patient was seen with Dr. Arbutus Ped today.  Dr. Arbutus Ped personally and independently reviewed the scan and discussed results with the patient today.  The scan showed  Intensely hypermetabolic solid 4.2 cm medial right upper lobe lung mass, compatible with persistent viable malignancy, with surrounding evolving post radiation change.  Dr. Arbutus Ped recommends options include referral to a surgeon at [facility] to see if they would consider surgical resection, referral to radiation, or chemotherapy with carboplatin and taxol.   The patient opted to see a Careers adviser.   We will see the patient back after he sees the surgeon at [facility].   We will hold his treatment today.   We will keep his appointment in 4 weeks, or sooner based on his evaluation with [facility].   I refilled the carafate.   The patient was advised to call immediately if she has any concerning symptoms in the interval. The patient voices understanding of current disease status and treatment options and is in agreement with the current care plan. All questions were answered. The patient knows to call the clinic with any problems, questions or concerns. We can certainly see the patient much sooner if necessary   No orders of the defined types were placed in this encounter.    I spent {CHL ONC TIME VISIT - [id number]} counseling the patient face to face. The total time spent  in the appointment was {CHL ONC TIME VISIT - ZHYQM:[id number]}.  Nahun Kronberg L Keidan Aumiller, PA-C 09/19/23

## 2023-09-24 ENCOUNTER — Inpatient Hospital Stay: Payer: Medicare Other | Attending: Internal Medicine

## 2023-09-24 ENCOUNTER — Inpatient Hospital Stay: Payer: Medicare Other

## 2023-09-24 ENCOUNTER — Inpatient Hospital Stay (HOSPITAL_BASED_OUTPATIENT_CLINIC_OR_DEPARTMENT_OTHER): Payer: Medicare Other | Admitting: Physician Assistant

## 2023-09-24 ENCOUNTER — Inpatient Hospital Stay: Payer: Medicare Other | Admitting: Physician Assistant

## 2023-09-24 VITALS — BP 137/85 | HR 86 | Temp 98.7°F | Resp 17 | Wt 141.3 lb

## 2023-09-24 DIAGNOSIS — E041 Nontoxic single thyroid nodule: Secondary | ICD-10-CM | POA: Insufficient documentation

## 2023-09-24 DIAGNOSIS — C3411 Malignant neoplasm of upper lobe, right bronchus or lung: Secondary | ICD-10-CM | POA: Insufficient documentation

## 2023-09-24 DIAGNOSIS — Z452 Encounter for adjustment and management of vascular access device: Secondary | ICD-10-CM | POA: Diagnosis not present

## 2023-09-24 DIAGNOSIS — R059 Cough, unspecified: Secondary | ICD-10-CM | POA: Insufficient documentation

## 2023-09-24 DIAGNOSIS — C3491 Malignant neoplasm of unspecified part of right bronchus or lung: Secondary | ICD-10-CM | POA: Diagnosis not present

## 2023-09-24 LAB — CBC WITH DIFFERENTIAL (CANCER CENTER ONLY)
Abs Immature Granulocytes: 0.03 10*3/uL (ref 0.00–0.07)
Basophils Absolute: 0 10*3/uL (ref 0.0–0.1)
Basophils Relative: 0 %
Eosinophils Absolute: 0.1 10*3/uL (ref 0.0–0.5)
Eosinophils Relative: 2 %
HCT: 38.1 % — ABNORMAL LOW (ref 39.0–52.0)
Hemoglobin: 12.4 g/dL — ABNORMAL LOW (ref 13.0–17.0)
Immature Granulocytes: 0 %
Lymphocytes Relative: 11 %
Lymphs Abs: 0.8 10*3/uL (ref 0.7–4.0)
MCH: 27 pg (ref 26.0–34.0)
MCHC: 32.5 g/dL (ref 30.0–36.0)
MCV: 83 fL (ref 80.0–100.0)
Monocytes Absolute: 0.7 10*3/uL (ref 0.1–1.0)
Monocytes Relative: 10 %
Neutro Abs: 5.3 10*3/uL (ref 1.7–7.7)
Neutrophils Relative %: 77 %
Platelet Count: 297 10*3/uL (ref 150–400)
RBC: 4.59 MIL/uL (ref 4.22–5.81)
RDW: 13.7 % (ref 11.5–15.5)
WBC Count: 7 10*3/uL (ref 4.0–10.5)
nRBC: 0 % (ref 0.0–0.2)

## 2023-09-24 LAB — CMP (CANCER CENTER ONLY)
ALT: 13 U/L (ref 0–44)
AST: 17 U/L (ref 15–41)
Albumin: 3.9 g/dL (ref 3.5–5.0)
Alkaline Phosphatase: 85 U/L (ref 38–126)
Anion gap: 7 (ref 5–15)
BUN: 21 mg/dL (ref 8–23)
CO2: 26 mmol/L (ref 22–32)
Calcium: 9.3 mg/dL (ref 8.9–10.3)
Chloride: 107 mmol/L (ref 98–111)
Creatinine: 0.97 mg/dL (ref 0.61–1.24)
GFR, Estimated: >60 mL/min (ref >60)
Glucose, Bld: 99 mg/dL (ref 70–99)
Potassium: 3.8 mmol/L (ref 3.5–5.1)
Sodium: 140 mmol/L (ref 135–145)
Total Bilirubin: 0.8 mg/dL (ref <1.2)
Total Protein: 7.4 g/dL (ref 6.5–8.1)

## 2023-09-24 MED ORDER — SODIUM CHLORIDE 0.9% FLUSH
10.0000 mL | Freq: Once | INTRAVENOUS | Status: AC
  Administered 2023-09-24: 10 mL

## 2023-09-24 MED ORDER — SUCRALFATE 1 G PO TABS: 1.0000 g | ORAL_TABLET | Freq: Three times a day (TID) | ORAL | 2 refills | Status: AC

## 2023-09-24 NOTE — Progress Notes (Signed)
[facility] OFFICE PROGRESS NOTE  [name], [name], MD [address] [place] [address]  DIAGNOSIS: 1) Stage IIIb (T3, N2, M0) non-small cell lung cancer, squamous cell carcinoma presented with 2 separate right upper lobe lung nodule in addition to mediastinal lymphadenopathy diagnosed in January 2024  2) left thyroid nodule.    PDL1 expression: 1%  PRIOR THERAPY:  1) A course of concurrent chemoradiation with weekly carboplatin for AUC of 2 and paclitaxel 45 Mg/M2. Last dose on 02/05/23. Status post 6 cycles. Taxol changed to abraxane starting from cycle #4 due to reaction to taxol  2) Consolidation immunotherapy with Imfinzi 1500 mg IV every 4 weeks.  First dose on 03/12/23.  Status post 7 cycles. Discontinued due to persistent disease   CURRENT THERAPY: Referral to cardiothoracic surgeon at [facility]  INTERVAL HISTORY: Alan Mendez 82 y.o. male returns to the clinic today for a follow-up visit.  The patient was last seen 1 month ago by Dr. Arbutus Ped and myself.  The patient is feeling well today without any concerning complaints except he is nervous about his scan results.  He had a CT scan performed at his last appointment and had a PET scan to further evaluate this for possible local recurrence.    He was some trouble swallowing certain foods since radiation but did mention that this is improving.  He uses Carafate needs a refill.  The patient also reports a decrease in stamina, particularly noticeable during road biking, which they attribute to either the throat issue or potential scarring from radiation therapy. They deny any significant struggle with breathing, but acknowledge a change in lung function post-radiation. The patient also reports a cough, which has improved, but is still present with some phlegm. The cough is triggered by talking a lot or dehydration, but is no longer painful.  he patient denies any fevers, infections, night sweats, nausea, vomiting,  diarrhea, constipation, unusual rashes or skin changes, and headaches or vision changes. They also deny any swelling. The patient has been active, recently [place] for [place].  He is here today for evaluation repeat blood work before undergoing cycle #8.    MEDICAL HISTORY: Past Medical History:  Diagnosis Date   Acquired solitary kidney    left --  s/p  right nephroureterctomy 01/ 2018   Anxiety    no current problems   Depression    no current problems   Elevated PSA    Enlarged lymph node 11/2022   enlarged right paratracheal lymph node.   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, negative margins)   History of kidney stones    passed stones and also surgery to remove   History of unilateral nephrectomy    01/ 2018  right nephroureterectomy for renal pelvis mass (carcinoma in situ)   HLD (hyperlipidemia)    on crestor   Hyperplasia of prostate without lower urinary tract symptoms (LUTS)    Lung cancer (HCC) 12/07/2022   Nephrolithiasis    bilateral non-obstructive   Pneumonia    x 1 - yrs ago   Pulmonary nodules 11/2022   2 right upper lobe pulmonary nodules   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HCC)    Renal cyst, acquired, right    right kidney  removed   Wears glasses     ALLERGIES:  is allergic to paclitaxel, lamisil [terbinafine], and ivp dye [iodinated contrast media].  MEDICATIONS:  Current Outpatient Medications  Medication Sig Dispense Refill   cholecalciferol (VITAMIN D3) 25 MCG (1000 UNIT) tablet Take 1,000 Units by mouth daily.     diphenhydrAMINE (BENADRYL) 50 MG tablet Take 1 tablet 2 hours before the CT scan. 1 tablet 0   lidocaine-prilocaine (EMLA) cream Apply small amount cream to port a cath site 30-60 minutes prior  to accessing port 30 g 0   Multiple Vitamins-Minerals (MULTIVITAMIN WITH MINERALS) tablet Take 1 tablet by mouth daily. Centrum     predniSONE (DELTASONE) 50 MG tablet 1 tablet p.o. 14 hours then 7 hours then 2 hours before the scan. Please take Benadryl 50 mg p.o. x 1 2 hours before the scan with the last dose of prednisone. 3 tablet 0   prochlorperazine (COMPAZINE) 10 MG tablet Take 10 mg by mouth every 6 (six) hours as needed for nausea or vomiting.     rosuvastatin (CRESTOR) 10 MG tablet TAKE 1 TABLET EACH DAY. (Patient taking differently: Take 20 mg by mouth daily.) 30 tablet 0   silver sulfADIAZINE (SILVADENE) 1 % cream Apply 1 Application topically daily. 50 g 0   sucralfate (CARAFATE) 1 g tablet Take 1 tablet (1 g total) by mouth 4 (four) times daily -  with meals and at bedtime. Dissolve tablet in water -swish and swallow. 30 tablet 2   tamsulosin (FLOMAX) 0.4 MG CAPS capsule Take 1 capsule (0.4 mg total) by mouth daily. (Patient taking differently: Take 0.4 mg by mouth daily after breakfast.) 30 capsule 5   No current facility-administered medications for this visit.    SURGICAL HISTORY:  Past Surgical History:  Procedure Laterality Date   APPENDECTOMY  child   BRONCHIAL BIOPSY  12/07/2022   Procedure: BRONCHIAL BIOPSIES;  Surgeon: Josephine Igo, DO;  Location: [facility];  Service: Pulmonary;;   BRONCHIAL BRUSHINGS  12/07/2022   Procedure: BRONCHIAL BRUSHINGS;  Surgeon: Josephine Igo, DO;  Location: [facility];  Service: Pulmonary;;   BRONCHIAL NEEDLE ASPIRATION BIOPSY  12/07/2022   Procedure: BRONCHIAL NEEDLE ASPIRATION BIOPSIES;  Surgeon: Josephine Igo, DO;  Location: [facility];  Service: Pulmonary;;   CATARACT EXTRACTION W/ INTRAOCULAR LENS  IMPLANT, BILATERAL  2017   COLONOSCOPY  08/15/2012   Tubular Adenoma, No high grade dysplasia or malignacy.   CYSTOSCOPY W/ RETROGRADES Right 09/25/2016   Procedure: CYSTOSCOPY, URETHRAL DILITATION  WITH RETROGRADE PYELOGRAM,  BRUSH BIOPSIES OF RIGHT RENAL MASS;  Surgeon: Jethro Bolus, MD;  Location: [facility];  Service: Urology;  Laterality: Right;   CYSTOSCOPY W/ RETROGRADES Left 03/16/2017   Procedure: CYSTOSCOPY WITH RETROGRADE PYELOGRAM;  Surgeon: Crist Fat, MD;  Location: [facility];  Service: Urology;  Laterality: Left;   CYSTOSCOPY W/ URETERAL STENT REMOVAL Right 09/25/2016   Procedure: CYSTOSCOPY WITH STENT REMOVAL;  Surgeon: Jethro Bolus, MD;  Location: [facility];  Service: Urology;  Laterality: Right;   CYSTOSCOPY WITH RETROGRADE PYELOGRAM, URETEROSCOPY AND STENT PLACEMENT Right 08/21/2016   Procedure: CYSTOSCOPY WITH RIGHT RETROGRADE PYELOGRAM, RIGHT FLEXIBLE AND RIGID URETEROSCOPY, INSERTION DOUBLE J STENT RIGHT;  Surgeon: Jethro Bolus, MD;  Location: [facility] ;  Service: Urology;  Laterality: Right;   EXTRACORPOREAL SHOCK WAVE LITHOTRIPSY  2009   IR IMAGING GUIDED PORT INSERTION  07/09/2023   KNEE SURGERY Right 1980's   ROBOT ASSITED LAPAROSCOPIC NEPHROURETERECTOMY Right 11/15/2016  Procedure: RIGHT XI ROBOT ASSITED LAPAROSCOPIC NEPHROURETERECTOMY;  Surgeon: Crist Fat, MD;  Location: [facility];  Service: Urology;  Laterality: Right;   TRANSURETHRAL RESECTION OF BLADDER TUMOR WITH MITOMYCIN-C Left 03/16/2017   Procedure: CYSTOSCOPY BIOPSIES OF BLADDER TUMOR WITH FULGURATION;  Surgeon: Crist Fat, MD;  Location: [facility];  Service: Urology;  Laterality: Left;   URETEROSCOPY Right 09/25/2016   Procedure: RIGHT URETEROSCOPY;  Surgeon: Jethro Bolus, MD;  Location: [facility];  Service: Urology;  Laterality: Right;   VIDEO BRONCHOSCOPY WITH ENDOBRONCHIAL ULTRASOUND Right 12/07/2022   Procedure: VIDEO BRONCHOSCOPY WITH ENDOBRONCHIAL ULTRASOUND;  Surgeon: Josephine Igo, DO;  Location: [facility];  Service: Pulmonary;  Laterality: Right;    REVIEW OF SYSTEMS:   Review  of Systems  Constitutional: Negative for appetite change, chills, fatigue, fever and unexpected weight change.  HENT: Positive for occasional dysphagia (improving). Negative for mouth sores, nosebleeds, and trouble swallowing.   Eyes: Negative for eye problems and icterus.  Respiratory: Positive for improving occasional cough. Negative for hemoptysis, shortness of breath and wheezing.   Cardiovascular: Negative for chest pain and leg swelling.  Gastrointestinal: Negative for abdominal pain, constipation, diarrhea, nausea and vomiting.  Genitourinary: Negative for bladder incontinence, difficulty urinating, dysuria, frequency and hematuria.   Musculoskeletal: Negative for back pain, gait problem, neck pain and neck stiffness.  Skin: Negative for itching and rash.  Neurological: Negative for dizziness, extremity weakness, gait problem, headaches, light-headedness and seizures.  Hematological: Negative for adenopathy. Does not bruise/bleed easily.  Psychiatric/Behavioral: Negative for confusion, depression and sleep disturbance. The patient is not nervous/anxious.   PHYSICAL EXAMINATION:  There were no vitals taken for this visit.  ECOG PERFORMANCE STATUS: 1  Physical Exam  Constitutional: Oriented to person, place, and time and well-developed, well-nourished, and in no distress.  HENT:  Head: Normocephalic and atraumatic.  Mouth/Throat: Oropharynx is clear and moist. No oropharyngeal exudate.  Eyes: Conjunctivae are normal. Right eye exhibits no discharge. Left eye exhibits no discharge. No scleral icterus.  Neck: Normal range of motion. Neck supple.  Cardiovascular: Normal rate, regular rhythm, normal heart sounds and intact distal pulses.   Pulmonary/Chest: Effort normal and breath sounds normal. No respiratory distress. No wheezes. No rales.  Abdominal: Soft. Bowel sounds are normal. Exhibits no distension and no mass. There is no tenderness.  Musculoskeletal: Normal range of motion.  Exhibits no edema.  Lymphadenopathy:    No cervical adenopathy.  Neurological: Alert and oriented to person, place, and time. Exhibits normal muscle tone. Gait normal. Coordination normal.  Skin: Skin is warm and dry. No rash noted. Not diaphoretic. No erythema. No pallor.  Psychiatric: Mood, memory and judgment normal.  Vitals reviewed.  LABORATORY DATA: Lab Results  Component Value Date   WBC 7.0 09/24/2023   HGB 12.4 (L) 09/24/2023   HCT 38.1 (L) 09/24/2023   MCV 83.0 09/24/2023   PLT 297 09/24/2023      Chemistry      Component Value Date/Time   NA 140 09/24/2023 0747   K 3.8 09/24/2023 0747   CL 107 09/24/2023 0747   CO2 26 09/24/2023 0747   BUN 21 09/24/2023 0747   CREATININE 0.97 09/24/2023 0747   CREATININE 0.85 12/10/2014 1550      Component Value Date/Time   CALCIUM 9.3 09/24/2023 0747   ALKPHOS 85 09/24/2023 0747   AST 17 09/24/2023 0747   ALT 13 09/24/2023 0747   BILITOT 0.8 09/24/2023 0747       RADIOGRAPHIC  STUDIES:  NM PET Image Restag (PS) Skull Base To Thigh  Result Date: 09/24/2023 CLINICAL DATA:  Subsequent treatment strategy for non-small cell lung cancer status post concurrent chemoradiation therapy with consolidation immunotherapy. Additional history of bladder cancer and right nephroureterectomy in 2018 for urothelial malignancy. EXAM: NUCLEAR MEDICINE PET SKULL BASE TO THIGH TECHNIQUE: 2.6 mCi F-18 FDG was injected intravenously. Full-ring PET imaging was performed from the skull base to thigh after the radiotracer. CT data was obtained and used for attenuation correction and anatomic localization. Fasting blood glucose: 95 mg/dl COMPARISON:  62/83/1517 chest CT.  12/14/2022 PET-CT. FINDINGS: Mediastinal blood pool activity: SUV max 2.6 Liver activity: SUV max NA NECK: Hypermetabolic 1.3 cm left thyroid hypodense nodule with max SUV 18.8 (series 4/image 40), not substantially changed from 12/14/2022 PET-CT. No enlarged or hypermetabolic lymph nodes  in the neck. Incidental CT findings: Right internal jugular Port-A-Cath terminates at the cavoatrial junction. CHEST: Mildly hypermetabolic enlarged 1.7 cm retrotracheal lymph node with max SUV 4.0 (series 4/image 50), previously 1.8 cm with max SUV 5.8, slightly decreased in size and metabolism. No newly enlarged or newly hypermetabolic mediastinal lymph nodes. No enlarged or hypermetabolic hilar lymph nodes. No enlarged or hypermetabolic axillary lymph nodes. Persistent intensely hypermetabolic solid 4.2 x 2.6 cm medial right upper lobe lung mass with max SUV 16.4 (series 4/image 51), with surrounding sharply marginated dense consolidation compatible with evolving surrounding post radiation change. No additional hypermetabolic pulmonary findings. Incidental CT findings: Atherosclerotic nonaneurysmal thoracic aorta. ABDOMEN/PELVIS: No abnormal hypermetabolic activity within the liver, pancreas, adrenal glands, or spleen. No hypermetabolic lymph nodes in the abdomen or pelvis. Incidental CT findings: Cholelithiasis. Simple 1.3 cm anterior liver cyst, stable. Stable postsurgical changes from right nephroureterectomy. Atherosclerotic nonaneurysmal abdominal aorta. Marked sigmoid diverticulosis. Mild prostatomegaly. SKELETON: No focal hypermetabolic activity to suggest skeletal metastasis. Incidental CT findings: None. IMPRESSION: 1. Intensely hypermetabolic solid 4.2 cm medial right upper lobe lung mass, compatible with persistent viable malignancy, with surrounding evolving post radiation change. 2. Mildly hypermetabolic enlarged retrotracheal lymph node, slightly decreased in size and metabolism. 3. No evidence of hypermetabolic metastatic disease in the neck, abdomen or pelvis. 4. Hypermetabolic 1.3 cm left thyroid nodule, not substantially changed from 12/14/2022 PET-CT. Please see 08/22/2023 biopsy results for this nodule. 5.  Aortic Atherosclerosis (ICD10-I70.0). Electronically Signed   By: Delbert Phenix M.D.    On: 09/24/2023 08:54     ASSESSMENT/PLAN:  This is a very pleasant 82  year old Caucasian male with Stage IIIb (T3, N2, M0) non-small cell lung cancer, squamous cell carcinoma presented with 2 separate right upper lobe lung nodule in addition to mediastinal lymphadenopathy diagnosed in January 2024. His PDL1 expression is 1%.    The patient completed a course of concurrent chemoradiation with weekly carboplatin for AUC of 2 and paclitaxel 45 Mg/M2.  Status post 6 cycles. Starting from cycle #4, the taxol was switched to abraxane due to an infusion reaction to taxol.  Patient's last chemotherapy was on 02/05/2023.    He is currently on consolidation immunotherapy with Imfinzi 15 to milligrams every 4 weeks.  He is status post 7 cycles.  At his last appointment, his scan showed interval evolution of right parahilar scarring/fibrosis. Small nodules in the medial suprahilar right lung previously have become incorporated into this more confluent soft tissue density showing a masslike nodular character measuring 4.3 x 1.7 cm. While this masslike component may be related to confluent scarring, recurrent/residual disease is a concern.  Dr. Arbutus Ped recommended a PET  scan. The patient was seen with Dr. Arbutus Ped today.  Dr. Arbutus Ped personally and independently reviewed the scan and discussed results with the patient today.  The scan showed  Intensely hypermetabolic solid 4.2 cm medial right upper lobe lung mass, compatible with persistent viable malignancy, with surrounding evolving post radiation change.  Dr. Arbutus Ped recommends options include referral to a surgeon at [facility] to see if they would consider surgical resection, referral to radiation, or chemotherapy with carboplatin and taxol.   The patient opted to see a Careers adviser.   We will see the patient back after he sees the surgeon at [facility].   We will hold his treatment today.   We will keep his appointment in 4 weeks, or sooner based on his evaluation  with [facility].   I refilled the carafate.   The patient was advised to call immediately if she has any concerning symptoms in the interval. The patient voices understanding of current disease status and treatment options and is in agreement with the current care plan. All questions were answered. The patient knows to call the clinic with any problems, questions or concerns. We can certainly see the patient much sooner if necessary   No orders of the defined types were placed in this encounter.   Alan Mendez L Hazeline Charnley, PA-C 09/24/23  ADDENDUM: Hematology/Oncology Attending: I had a face-to-face encounter with the patient today.  I reviewed his record, lab, scan and recommended his care plan.  This is a very pleasant 82 years old white male with a stage IIIb non-small cell lung cancer, squamous cell carcinoma presented with 2 separate right upper lobe lung nodules in addition to mediastinal lymphadenopathy diagnosed in January 2024 with PD-L1 expression of 1% and no actionable mutations on the molecular studies by [id number].  The patient was treated with a course of concurrent chemoradiation with weekly carboplatin and paclitaxel with partial response followed by consolidation treatment with immunotherapy with Imfinzi status post 7 cycles but this was discontinued recently secondary to disease progression in the right hilar mass. He had repeat PET scan performed recently.  I personally and independently reviewed the PET scan images and discussed the result and showed the images to the patient today. Unfortunately the PET scan showed intensely hypermetabolic solid 4.2 cm medial right upper lobe lung mass compatible with persistent viable malignancy with surrounding evolving postradiation changes and mildly hypermetabolic enlarged retrotracheal lymph node slightly decreased in size and metabolism but no other evidence of metastatic disease in the neck, abdomen or pelvis. I had a lengthy discussion  with the patient today about his current condition and treatment options.  I recommended for him to discontinue his current treatment with immunotherapy at this point because of the concern for disease progression. I discussed with the patient the option of referring him to cardiothoracic surgery at [facility] for discussion of the probability of surgical resection of the residual disease versus more palliative radiotherapy to the hypermetabolic right hilar mass versus consideration of systemic chemotherapy with carboplatin and paclitaxel plus minus immunotherapy. The patient is interested in discussion with the surgeon first. We will see him back for follow-up visit in few weeks after his surgical evaluation at [facility]. He was advised to call immediately if he has any other concerning symptoms in the interval. The total time spent in the appointment was 30 minutes. Disclaimer: This note was dictated with voice recognition software. Similar sounding words can inadvertently be transcribed and may be missed upon review. Lajuana Matte, MD

## 2023-09-24 NOTE — Progress Notes (Signed)
Pt was reactivated in error.  Pt did not receive treatment today and was deaccessed by A. Hilma Favors.

## 2023-09-27 ENCOUNTER — Telehealth: Payer: Self-pay | Admitting: Medical Oncology

## 2023-09-27 NOTE — Telephone Encounter (Signed)
Faxed records to [facility] cardiothoracic.

## 2023-09-28 ENCOUNTER — Telehealth: Payer: Self-pay | Admitting: Medical Oncology

## 2023-09-28 NOTE — Telephone Encounter (Signed)
Pt asking if [facility] will call him . I told pt the records were received by Phillis Haggis and the thoracic surgery oncologist will review records and images and f/u with pt. Tyvon stated he will see the next available thoracic oncology surgeon and emailed Deb that pt will see first available.

## 2023-10-02 ENCOUNTER — Telehealth: Payer: Self-pay | Admitting: Medical Oncology

## 2023-10-03 ENCOUNTER — Telehealth: Payer: Self-pay | Admitting: Medical Oncology

## 2023-10-03 ENCOUNTER — Other Ambulatory Visit: Payer: Self-pay

## 2023-10-03 ENCOUNTER — Encounter: Payer: Self-pay | Admitting: Internal Medicine

## 2023-10-03 NOTE — Telephone Encounter (Signed)
12/11-appt with Dr Langston Masker.  PFTS first then  Dr Langston Masker and then preadmission.  Danella Sensing @ [facility] said someone from [place] will call him with the specifics for the appt. Lloyd Huger notified.

## 2023-10-03 NOTE — Telephone Encounter (Signed)
Pt aware of [facility] appt date and time.

## 2023-10-03 NOTE — Telephone Encounter (Signed)
ILVM to return my call and sent email re: his referral.

## 2023-10-22 ENCOUNTER — Ambulatory Visit: Payer: Medicare Other | Admitting: Internal Medicine

## 2023-10-22 ENCOUNTER — Ambulatory Visit: Payer: Medicare Other

## 2023-10-22 ENCOUNTER — Other Ambulatory Visit: Payer: Medicare Other

## 2023-10-23 ENCOUNTER — Telehealth: Payer: Self-pay | Admitting: Medical Oncology

## 2023-10-23 NOTE — Telephone Encounter (Signed)
Pt goes tomorrow to [facility] to see Careers adviser. He asked about sending his EKG from feb to surgeon I told Jullien Fielding has the records they need and they will do an EKG. Marland Kitchen

## 2023-10-24 DIAGNOSIS — R918 Other nonspecific abnormal finding of lung field: Secondary | ICD-10-CM | POA: Diagnosis not present

## 2023-10-24 DIAGNOSIS — Z01818 Encounter for other preprocedural examination: Secondary | ICD-10-CM | POA: Diagnosis not present

## 2023-10-24 DIAGNOSIS — E041 Nontoxic single thyroid nodule: Secondary | ICD-10-CM | POA: Diagnosis not present

## 2023-10-24 DIAGNOSIS — Z87891 Personal history of nicotine dependence: Secondary | ICD-10-CM | POA: Diagnosis not present

## 2023-10-24 DIAGNOSIS — C3491 Malignant neoplasm of unspecified part of right bronchus or lung: Secondary | ICD-10-CM | POA: Diagnosis not present

## 2023-10-24 DIAGNOSIS — Z923 Personal history of irradiation: Secondary | ICD-10-CM | POA: Diagnosis not present

## 2023-10-24 DIAGNOSIS — R0602 Shortness of breath: Secondary | ICD-10-CM | POA: Diagnosis not present

## 2023-10-29 DIAGNOSIS — E041 Nontoxic single thyroid nodule: Secondary | ICD-10-CM | POA: Diagnosis not present

## 2023-10-29 DIAGNOSIS — C3491 Malignant neoplasm of unspecified part of right bronchus or lung: Secondary | ICD-10-CM | POA: Diagnosis not present

## 2023-11-09 DIAGNOSIS — J029 Acute pharyngitis, unspecified: Secondary | ICD-10-CM | POA: Diagnosis not present

## 2023-11-09 DIAGNOSIS — R0981 Nasal congestion: Secondary | ICD-10-CM | POA: Diagnosis not present

## 2023-11-09 DIAGNOSIS — R5383 Other fatigue: Secondary | ICD-10-CM | POA: Diagnosis not present

## 2023-11-09 DIAGNOSIS — Z1152 Encounter for screening for COVID-19: Secondary | ICD-10-CM | POA: Diagnosis not present

## 2023-11-09 DIAGNOSIS — C3491 Malignant neoplasm of unspecified part of right bronchus or lung: Secondary | ICD-10-CM | POA: Diagnosis not present

## 2023-11-09 DIAGNOSIS — J069 Acute upper respiratory infection, unspecified: Secondary | ICD-10-CM | POA: Diagnosis not present

## 2023-11-09 DIAGNOSIS — R051 Acute cough: Secondary | ICD-10-CM | POA: Diagnosis not present

## 2023-11-28 DIAGNOSIS — Z9221 Personal history of antineoplastic chemotherapy: Secondary | ICD-10-CM | POA: Diagnosis not present

## 2023-11-28 DIAGNOSIS — C3491 Malignant neoplasm of unspecified part of right bronchus or lung: Secondary | ICD-10-CM | POA: Diagnosis not present

## 2023-11-28 DIAGNOSIS — Z87891 Personal history of nicotine dependence: Secondary | ICD-10-CM | POA: Diagnosis not present

## 2023-11-28 DIAGNOSIS — C3411 Malignant neoplasm of upper lobe, right bronchus or lung: Secondary | ICD-10-CM | POA: Diagnosis not present

## 2023-11-28 DIAGNOSIS — J398 Other specified diseases of upper respiratory tract: Secondary | ICD-10-CM | POA: Diagnosis not present

## 2023-11-29 ENCOUNTER — Telehealth: Payer: Self-pay | Admitting: Internal Medicine

## 2023-11-29 ENCOUNTER — Encounter: Payer: Self-pay | Admitting: Internal Medicine

## 2023-12-03 NOTE — Progress Notes (Unsigned)
[facility] OFFICE PROGRESS NOTE  [name], [name], MD [address] [place] [address]  DIAGNOSIS:  1) Stage IIIb (T3, N2, M0) non-small cell lung cancer, squamous cell carcinoma presented with 2 separate right upper lobe lung nodule in addition to mediastinal lymphadenopathy diagnosed in January 2024  2) left thyroid nodule.    PDL1 expression: 1%  PRIOR THERAPY: 1) A course of concurrent chemoradiation with weekly carboplatin for AUC of 2 and paclitaxel 45 Mg/M2. Last dose on 02/05/23. Status post 6 cycles. Taxol changed to abraxane starting from cycle #4 due to reaction to taxol  2) Consolidation immunotherapy with Imfinzi 1500 mg IV every 4 weeks.  First dose on 03/12/23.  Status post 7 cycles. Discontinued due to persistent disease   CURRENT THERAPY: None  INTERVAL HISTORY: Alan Mendez 83 y.o. male returns to the clinic today for a follow-up visit accompanied by his wife. The patient was last seen in the clinic by Dr. Arbutus Ped and myself on 09/24/2023.  At that point in time, he had a repeat CT scan and PET scan to evaluate local recurrence/residual disease.  We discussed options for management with him at that time which is to see surgeon to see if he would be considered for surgical resection, referral to radiation oncology to see if he can undergo additional radiation to this site, versus having to look at systemic chemotherapy options.  The patient saw Dr. Daisey Must at [facility].  The patient had a V/Q. scan which indicates that he is not a surgical candidate.  Per their note, if surgical treatment were to be pursued it would be highly likely that he would be, oxygen dependent which would affect his quality of life in addition to the risks of surgical resection.   Since last being seen the patient denies any other major changes in his health except he did have a cold recently. He saw his PCP for antibiotics. His cough is improved and back to his baseline cough. His  nasal congestion comes and goes.   The patient is still active on a regular basis. He uses stationary bikes during the winter without significant dyspnea on exertion.  The patient denies any fevers, infections, night sweats, nausea, vomiting, diarrhea, constipation, unusual rashes or skin changes, and any headaches or vision changes. He also denies any swelling. He is here for evaluation and discuss his options.    MEDICAL HISTORY: Past Medical History:  Diagnosis Date   Acquired solitary kidney    left --  s/p  right nephroureterctomy 01/ 2018   Anxiety    no current problems   Depression    no current problems   Elevated PSA    Enlarged lymph node 11/2022   enlarged right paratracheal lymph node.   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, negative margins)   History of kidney stones    passed stones and also surgery to remove   History of unilateral nephrectomy    01/ 2018  right nephroureterectomy for renal pelvis mass (carcinoma in situ)   HLD (hyperlipidemia)    on crestor   Hyperplasia of prostate without lower urinary tract symptoms (LUTS)    Lung cancer (HCC) 12/07/2022   Nephrolithiasis    bilateral non-obstructive   Pneumonia  x 1 - yrs ago   Pulmonary nodules 11/2022   2 right upper lobe pulmonary nodules   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HCC)    Renal cyst, acquired, right    right kidney removed   Wears glasses     ALLERGIES:  is allergic to paclitaxel, lamisil [terbinafine], and ivp dye [iodinated contrast media].  MEDICATIONS:  Current Outpatient Medications  Medication Sig Dispense Refill   cholecalciferol (VITAMIN D3) 25 MCG (1000 UNIT) tablet Take 1,000 Units by mouth daily.     diphenhydrAMINE (BENADRYL) 50 MG tablet Take 1  tablet 2 hours before the CT scan. 1 tablet 0   lidocaine-prilocaine (EMLA) cream Apply small amount cream to port a cath site 30-60 minutes prior to accessing port 30 g 0   Multiple Vitamins-Minerals (MULTIVITAMIN WITH MINERALS) tablet Take 1 tablet by mouth daily. Centrum     predniSONE (DELTASONE) 50 MG tablet 1 tablet p.o. 14 hours then 7 hours then 2 hours before the scan. Please take Benadryl 50 mg p.o. x 1 2 hours before the scan with the last dose of prednisone. 3 tablet 0   prochlorperazine (COMPAZINE) 10 MG tablet Take 10 mg by mouth every 6 (six) hours as needed for nausea or vomiting.     rosuvastatin (CRESTOR) 10 MG tablet TAKE 1 TABLET EACH DAY. (Patient taking differently: Take 20 mg by mouth daily.) 30 tablet 0   silver sulfADIAZINE (SILVADENE) 1 % cream Apply 1 Application topically daily. 50 g 0   sucralfate (CARAFATE) 1 g tablet Take 1 tablet (1 g total) by mouth 4 (four) times daily -  with meals and at bedtime. Dissolve tablet in water -swish and swallow. 30 tablet 2   tamsulosin (FLOMAX) 0.4 MG CAPS capsule Take 1 capsule (0.4 mg total) by mouth daily. (Patient taking differently: Take 0.4 mg by mouth daily after breakfast.) 30 capsule 5   No current facility-administered medications for this visit.    SURGICAL HISTORY:  Past Surgical History:  Procedure Laterality Date   APPENDECTOMY  child   BRONCHIAL BIOPSY  12/07/2022   Procedure: BRONCHIAL BIOPSIES;  Surgeon: Josephine Igo, DO;  Location: [facility];  Service: Pulmonary;;   BRONCHIAL BRUSHINGS  12/07/2022   Procedure: BRONCHIAL BRUSHINGS;  Surgeon: Josephine Igo, DO;  Location: [facility];  Service: Pulmonary;;   BRONCHIAL NEEDLE ASPIRATION BIOPSY  12/07/2022   Procedure: BRONCHIAL NEEDLE ASPIRATION BIOPSIES;  Surgeon: Josephine Igo, DO;  Location: [facility];  Service: Pulmonary;;   CATARACT EXTRACTION W/ INTRAOCULAR LENS  IMPLANT, BILATERAL  2017   COLONOSCOPY  08/15/2012   Tubular Adenoma, No high  grade dysplasia or malignacy.   CYSTOSCOPY W/ RETROGRADES Right 09/25/2016   Procedure: CYSTOSCOPY, URETHRAL DILITATION  WITH RETROGRADE PYELOGRAM, BRUSH BIOPSIES OF RIGHT RENAL MASS;  Surgeon: Jethro Bolus, MD;  Location: [facility];  Service: Urology;  Laterality: Right;   CYSTOSCOPY W/ RETROGRADES Left 03/16/2017   Procedure: CYSTOSCOPY WITH RETROGRADE PYELOGRAM;  Surgeon: Crist Fat, MD;  Location: [facility];  Service: Urology;  Laterality: Left;   CYSTOSCOPY W/ URETERAL STENT REMOVAL Right 09/25/2016   Procedure: CYSTOSCOPY WITH STENT REMOVAL;  Surgeon: Jethro Bolus, MD;  Location: [facility];  Service: Urology;  Laterality: Right;   CYSTOSCOPY WITH RETROGRADE PYELOGRAM, URETEROSCOPY AND STENT PLACEMENT Right 08/21/2016   Procedure: CYSTOSCOPY WITH RIGHT RETROGRADE PYELOGRAM, RIGHT FLEXIBLE AND RIGID URETEROSCOPY, INSERTION DOUBLE J STENT RIGHT;  Surgeon: Jethro Bolus, MD;  Location: [facility];  Service: Urology;  Laterality: Right;   EXTRACORPOREAL SHOCK WAVE LITHOTRIPSY  2009   IR IMAGING GUIDED PORT INSERTION  07/09/2023   KNEE SURGERY Right 1980's   ROBOT ASSITED LAPAROSCOPIC NEPHROURETERECTOMY Right 11/15/2016   Procedure: RIGHT XI ROBOT ASSITED LAPAROSCOPIC NEPHROURETERECTOMY;  Surgeon: Crist Fat, MD;  Location: [facility];  Service: Urology;  Laterality: Right;   TRANSURETHRAL RESECTION OF BLADDER TUMOR WITH MITOMYCIN-C Left 03/16/2017   Procedure: CYSTOSCOPY BIOPSIES OF BLADDER TUMOR WITH FULGURATION;  Surgeon: Crist Fat, MD;  Location: [facility];  Service: Urology;  Laterality: Left;   URETEROSCOPY Right 09/25/2016   Procedure: RIGHT URETEROSCOPY;  Surgeon: Jethro Bolus, MD;  Location: [facility];  Service: Urology;  Laterality: Right;   VIDEO BRONCHOSCOPY WITH ENDOBRONCHIAL ULTRASOUND Right 12/07/2022   Procedure: VIDEO BRONCHOSCOPY WITH  ENDOBRONCHIAL ULTRASOUND;  Surgeon: Josephine Igo, DO;  Location: [facility];  Service: Pulmonary;  Laterality: Right;    REVIEW OF SYSTEMS:   Constitutional: Negative for appetite change, chills, fatigue, fever and unexpected weight change.  HENT: Positive for some nasal congestion. Negative for mouth sores, nosebleeds, and trouble swallowing.   Eyes: Negative for eye problems and icterus.  Respiratory: Positive for improving occasional cough. Negative for hemoptysis, shortness of breath and wheezing.   Cardiovascular: Negative for chest pain and leg swelling.  Gastrointestinal: Negative for abdominal pain, constipation, diarrhea, nausea and vomiting.  Genitourinary: Negative for bladder incontinence, difficulty urinating, dysuria, frequency and hematuria.   Musculoskeletal: Negative for back pain, gait problem, neck pain and neck stiffness.  Skin: Negative for itching and rash.  Neurological: Negative for dizziness, extremity weakness, gait problem, headaches, light-headedness and seizures.  Hematological: Negative for adenopathy. Does not bruise/bleed easily.  Psychiatric/Behavioral: Negative for confusion, depression and sleep disturbance. The patient is not nervous/anxious.     PHYSICAL EXAMINATION:  There were no vitals taken for this visit.  ECOG PERFORMANCE STATUS: 1  Physical Exam  Constitutional: Oriented to person, place, and time and well-developed, well-nourished, and in no distress.  HENT:  Head: Normocephalic and atraumatic.  Mouth/Throat: Oropharynx is clear and moist. No oropharyngeal exudate.  Eyes: Conjunctivae are normal. Right eye exhibits no discharge. Left eye exhibits no discharge. No scleral icterus.  Neck: Normal range of motion. Neck supple.  Cardiovascular: Normal rate, regular rhythm, normal heart sounds and intact distal pulses.   Pulmonary/Chest: Effort normal and breath sounds normal. No respiratory distress. No wheezes. No rales.  Abdominal:  Soft. Bowel sounds are normal. Exhibits no distension and no mass. There is no tenderness.  Musculoskeletal: Normal range of motion. Exhibits no edema.  Lymphadenopathy:    No cervical adenopathy.  Neurological: Alert and oriented to person, place, and time. Exhibits normal muscle tone. Gait normal. Coordination normal.  Skin: Skin is warm and dry. No rash noted. Not diaphoretic. No erythema. No pallor.  Psychiatric: Mood, memory and judgment normal.  Vitals reviewed.  LABORATORY DATA: Lab Results  Component Value Date   WBC 7.0 09/24/2023   HGB 12.4 (L) 09/24/2023   HCT 38.1 (L) 09/24/2023   MCV 83.0 09/24/2023   PLT 297 09/24/2023      Chemistry      Component Value Date/Time   NA 140 09/24/2023 0747   K 3.8 09/24/2023 0747   CL 107 09/24/2023 0747   CO2 26 09/24/2023 0747   BUN 21 09/24/2023 0747   CREATININE 0.97 09/24/2023 0747   CREATININE 0.85 12/10/2014 1550      Component Value Date/Time  CALCIUM 9.3 09/24/2023 0747   ALKPHOS 85 09/24/2023 0747   AST 17 09/24/2023 0747   ALT 13 09/24/2023 0747   BILITOT 0.8 09/24/2023 0747       RADIOGRAPHIC STUDIES:  No results found.   ASSESSMENT/PLAN:  This is a very pleasant 83 year old Caucasian male diagnosed with stage IIIb (T3, N2, M0) non-small cell lung cancer, squamous cell carcinoma. He presented with 2 separate right upper lobe lung nodules in addition to mediastinal lymphadenopathy. He was diagnosed in January 2024. His PDL1 expression is 1%.    The patient completed a course of concurrent chemoradiation with weekly carboplatin for AUC of 2 and paclitaxel 45 Mg/M2.  Status post 6 cycles. Starting from cycle #4, the taxol was switched to abraxane due to an infusion reaction to taxol.  The patient's last chemotherapy was on 02/05/2023. He saw Dr. Mitzi Hansen from radiation oncology.    He then was on consolidation immunotherapy with Imfinzi 1500 milligrams IV every 4 weeks.  He is status post 7 cycles. His last dose  was on 08/27/23.    His CT scan from October 2024 showed parahilar scarring/fibrosis. Small nodules in the medial suprahilar right lung previously have become incorporated into this more confluent soft tissue density showing a masslike nodular character measuring 4.3 x 1.7 cm. While this masslike component may be related to confluent scarring, recurrent/residual disease is a concern per radiology.   Dr. Arbutus Ped recommended a PET scan to further evaluate. The scan showed intensely hypermetabolic solid 4.2 cm medial right upper lobe lung mass, compatible with persistent viable malignancy, with surrounding evolving post radiation change.    The patient saw Dr. Daisey Must at [facility]. The patient was not a surgical candidate.  The patient is here today to discuss options which could be consideration of palliative systemic chemotherapy versus checking with radiation oncology to see if he is a candidate for additional radiation to this area.  The patient understands that he may not be a candidate for additional radiation since he did have radiation in the spring 2024 to this area.  An additional option to consider would be seeing if the patient is a candidate for treatment with pulsed electrical field bronchoscopy.  The patient would like to see if he is a candidate for local options first.  He would like to avoid chemotherapy unless necessary due to concerns with quality of life and side effects of treatment.  I have placed a referral to radiation oncology.  I will also reach out to pulmonology to see if he would be a candidate for PEF based on the location.  We will tentatively plan to see the patient back for follow-up visit in 2 weeks for evaluation and discuss next steps based on his evaluation with radiation oncology and pulmonary medicine   The patient was advised to call immediately if he has any concerning symptoms in the interval. The patient voices understanding of current disease status and  treatment options and is in agreement with the current care plan. All questions were answered. The patient knows to call the clinic with any problems, questions or concerns. We can certainly see the patient much sooner if necessary  No orders of the defined types were placed in this encounter.    Gaurav Baldree L Gage Weant, PA-C 12/03/23  ADDENDUM: Hematology/Oncology Attending: I had a face-to-face encounter with the patient today.  I reviewed his record, lab, scan and recommended his care plan.  This is a very pleasant 83 years old white male  with history of stage IIIb non-small cell lung cancer, squamous cell carcinoma diagnosed in January 2024 with PD-L1 expression of 1%.  The patient underwent a course of concurrent chemoradiation with weekly carboplatin and Abraxane followed by 7 cycles of consolidation treatment with immunotherapy with Imfinzi discontinued after the patient was found to have local disease recurrence.  The patient was seen recently at [facility] center per Dr. Daisey Must for evaluation and discussion of surgical resection if possible.  After extensive workup he was found to be not a good candidate for surgical resection. The patient is here today for evaluation and discussion of his treatment options.  I had a lengthy discussion with the patient and his wife today about several options for management of his condition including referral to radiation oncology for consideration of additional palliative radiotherapy to the local disease recurrence versus referral to Dr. Tonia Brooms with pulmonary medicine for PEF ablation.  If the patient is not a candidate for any additional local therapy, we will consider him for systemic chemotherapy likely with carboplatin, paclitaxel and Keytruda for 4 cycles followed by maintenance treatment with Keytruda. The patient is in agreement with the current plan. We will see him back for follow-up visit in around 2-3 weeks for evaluation and more  detailed discussion of his treatment based on his evaluation by radiation oncology and pulmonary medicine. The patient was advised to call immediately if he has any other concerning symptoms in the interval. The total time spent in the appointment was 30 minutes. Disclaimer: This note was dictated with voice recognition software. Similar sounding words can inadvertently be transcribed and may be missed upon review. Lajuana Matte, MD

## 2023-12-04 ENCOUNTER — Other Ambulatory Visit: Payer: Self-pay | Admitting: Medical Oncology

## 2023-12-04 DIAGNOSIS — C3491 Malignant neoplasm of unspecified part of right bronchus or lung: Secondary | ICD-10-CM

## 2023-12-05 ENCOUNTER — Inpatient Hospital Stay: Payer: Medicare Other

## 2023-12-05 ENCOUNTER — Inpatient Hospital Stay: Payer: Medicare Other | Attending: Internal Medicine | Admitting: Physician Assistant

## 2023-12-05 VITALS — BP 134/91 | HR 84 | Temp 97.9°F | Resp 16 | Wt 139.5 lb

## 2023-12-05 DIAGNOSIS — Z452 Encounter for adjustment and management of vascular access device: Secondary | ICD-10-CM | POA: Insufficient documentation

## 2023-12-05 DIAGNOSIS — C3491 Malignant neoplasm of unspecified part of right bronchus or lung: Secondary | ICD-10-CM

## 2023-12-05 DIAGNOSIS — C679 Malignant neoplasm of bladder, unspecified: Secondary | ICD-10-CM

## 2023-12-05 DIAGNOSIS — C3411 Malignant neoplasm of upper lobe, right bronchus or lung: Secondary | ICD-10-CM | POA: Diagnosis not present

## 2023-12-05 DIAGNOSIS — C3490 Malignant neoplasm of unspecified part of unspecified bronchus or lung: Secondary | ICD-10-CM

## 2023-12-05 DIAGNOSIS — E041 Nontoxic single thyroid nodule: Secondary | ICD-10-CM | POA: Diagnosis not present

## 2023-12-05 DIAGNOSIS — Z7189 Other specified counseling: Secondary | ICD-10-CM | POA: Diagnosis not present

## 2023-12-05 DIAGNOSIS — R918 Other nonspecific abnormal finding of lung field: Secondary | ICD-10-CM | POA: Insufficient documentation

## 2023-12-05 DIAGNOSIS — Z95828 Presence of other vascular implants and grafts: Secondary | ICD-10-CM

## 2023-12-05 LAB — CBC WITH DIFFERENTIAL (CANCER CENTER ONLY)
Abs Immature Granulocytes: 0.03 10*3/uL (ref 0.00–0.07)
Basophils Absolute: 0 10*3/uL (ref 0.0–0.1)
Basophils Relative: 1 %
Eosinophils Absolute: 0.1 10*3/uL (ref 0.0–0.5)
Eosinophils Relative: 2 %
HCT: 43.2 % (ref 39.0–52.0)
Hemoglobin: 14.3 g/dL (ref 13.0–17.0)
Immature Granulocytes: 1 %
Lymphocytes Relative: 13 %
Lymphs Abs: 0.8 10*3/uL (ref 0.7–4.0)
MCH: 26.9 pg (ref 26.0–34.0)
MCHC: 33.1 g/dL (ref 30.0–36.0)
MCV: 81.2 fL (ref 80.0–100.0)
Monocytes Absolute: 0.6 10*3/uL (ref 0.1–1.0)
Monocytes Relative: 9 %
Neutro Abs: 4.8 10*3/uL (ref 1.7–7.7)
Neutrophils Relative %: 74 %
Platelet Count: 225 10*3/uL (ref 150–400)
RBC: 5.32 MIL/uL (ref 4.22–5.81)
RDW: 13.6 % (ref 11.5–15.5)
WBC Count: 6.4 10*3/uL (ref 4.0–10.5)
nRBC: 0 % (ref 0.0–0.2)

## 2023-12-05 LAB — CMP (CANCER CENTER ONLY)
ALT: 16 U/L (ref 0–44)
AST: 21 U/L (ref 15–41)
Albumin: 4 g/dL (ref 3.5–5.0)
Alkaline Phosphatase: 86 U/L (ref 38–126)
Anion gap: 6 (ref 5–15)
BUN: 19 mg/dL (ref 8–23)
CO2: 26 mmol/L (ref 22–32)
Calcium: 9.3 mg/dL (ref 8.9–10.3)
Chloride: 106 mmol/L (ref 98–111)
Creatinine: 1 mg/dL (ref 0.61–1.24)
GFR, Estimated: >60 mL/min (ref >60)
Glucose, Bld: 159 mg/dL — ABNORMAL HIGH (ref 70–99)
Potassium: 3.7 mmol/L (ref 3.5–5.1)
Sodium: 138 mmol/L (ref 135–145)
Total Bilirubin: 0.6 mg/dL (ref 0.0–1.2)
Total Protein: 7.4 g/dL (ref 6.5–8.1)

## 2023-12-05 MED ORDER — HEPARIN SOD (PORK) LOCK FLUSH 100 UNIT/ML IV SOLN
500.0000 [IU] | Freq: Once | INTRAVENOUS | Status: AC
  Administered 2023-12-05: 500 [IU]

## 2023-12-05 MED ORDER — SODIUM CHLORIDE 0.9% FLUSH
10.0000 mL | Freq: Once | INTRAVENOUS | Status: AC
  Administered 2023-12-05: 10 mL

## 2023-12-10 ENCOUNTER — Telehealth: Payer: Self-pay | Admitting: Medical Oncology

## 2023-12-10 DIAGNOSIS — L905 Scar conditions and fibrosis of skin: Secondary | ICD-10-CM | POA: Diagnosis not present

## 2023-12-10 DIAGNOSIS — Z85828 Personal history of other malignant neoplasm of skin: Secondary | ICD-10-CM | POA: Diagnosis not present

## 2023-12-10 DIAGNOSIS — D225 Melanocytic nevi of trunk: Secondary | ICD-10-CM | POA: Diagnosis not present

## 2023-12-10 DIAGNOSIS — L570 Actinic keratosis: Secondary | ICD-10-CM | POA: Diagnosis not present

## 2023-12-10 DIAGNOSIS — L814 Other melanin hyperpigmentation: Secondary | ICD-10-CM | POA: Diagnosis not present

## 2023-12-10 DIAGNOSIS — L821 Other seborrheic keratosis: Secondary | ICD-10-CM | POA: Diagnosis not present

## 2023-12-10 DIAGNOSIS — D485 Neoplasm of uncertain behavior of skin: Secondary | ICD-10-CM | POA: Diagnosis not present

## 2023-12-10 DIAGNOSIS — D1801 Hemangioma of skin and subcutaneous tissue: Secondary | ICD-10-CM | POA: Diagnosis not present

## 2023-12-10 DIAGNOSIS — D2271 Melanocytic nevi of right lower limb, including hip: Secondary | ICD-10-CM | POA: Diagnosis not present

## 2023-12-10 NOTE — Telephone Encounter (Signed)
Pt LVM -  Griselda wants to be reassured that Dr Arbutus Ped is coordinating the decision regarding treatment with xrt ( appt Thursday w/ Revonda Standard and Dr Mitzi Hansen). and Pulmonary.  If he needs chemotherapy he is anxious to get started.

## 2023-12-11 ENCOUNTER — Other Ambulatory Visit: Payer: Self-pay

## 2023-12-11 ENCOUNTER — Telehealth: Payer: Self-pay

## 2023-12-11 DIAGNOSIS — C3491 Malignant neoplasm of unspecified part of right bronchus or lung: Secondary | ICD-10-CM

## 2023-12-11 NOTE — Progress Notes (Signed)
Referral placed to [facility].  Called office and received referral.

## 2023-12-11 NOTE — Telephone Encounter (Signed)
Patient called in and stated that Dr. Arbutus Ped has recommended to explore 2 options outside of chemo- radiation or PEF (pulsed electrical field) with pulmonology.  Patient stated that he has an appt with radiation on 1/30, but no appt with pulmonology.  Patient stated that he has seen Dr. Tonia Brooms in the past, but he is not affiliated with a practice at the moment. Patient is anxious, wants to know Dr. Asa Lente recommendation if radiation is not an option, and if any other pulmonologist does the PEF.  Patient also concerned about how long he can go without treatment. Informed patient that I would relay the message to Dr. Arbutus Ped and call him back.

## 2023-12-12 ENCOUNTER — Telehealth: Payer: Self-pay | Admitting: Radiation Oncology

## 2023-12-12 NOTE — Telephone Encounter (Signed)
Spoke with patient about Dr. Arbutus Ped recommendations. Per Dr Arbutus Ped- patient has appt with radiation 1/30.  Our office put a referral to see Dr. Doyce Para at [facility] for consideration of PEF surgery. Once patient sees radiation, patient will let us know if he is a candidate and awaiting appointment with Dr. Doyce Para. If patient remains anxious, chemotherapy is an option as well. Patient will keep our office updated.

## 2023-12-12 NOTE — Telephone Encounter (Signed)
I spoke with the patient to discuss his situation.  He was treated with chemoradiation in the spring 2024 for locally advanced lung cancer.  He has recently had imaging that confirms concern for local recurrence in the lung and regional lymphatic system.  He was referred back after finding out that he could not have additional surgical resection to consider other local therapy and to see whether he was a candidate for additional radiation.  Unfortunately given the dose and recency of his treatment in addition to the fields covering the lymphatic system, he would not be considered a candidate for curative therapy at this time.  The reasons for this could be risks of tissue necrosis, TE fistula, and risks of hemoptysis and/or hemorrhage.  He is also simultaneously looking into whether he is a candidate for pulsed electrical field therapy with Dr. Doyce Para, one of Dr. Myrlene Broker soon-to-be partners.  I will reach out to Dr. Arbutus Ped and to Dr. Tonia Brooms to help coordinate further discussion about this type of treatment with the patient.  The patient is comfortable canceling tomorrow's appointment.  I let him know we would be happy to review his situation in the future if needed.

## 2023-12-13 ENCOUNTER — Ambulatory Visit: Payer: Medicare Other

## 2023-12-13 ENCOUNTER — Ambulatory Visit: Payer: Medicare Other | Admitting: Radiation Oncology

## 2023-12-13 NOTE — Telephone Encounter (Signed)
Spoke with patient and he stated that he is not a candidate for radiation and is patiently waiting on a call from Dr. Tomasita Morrow office in [place] about PEF surgery.  Informed patient if he hasn't heard from Dr. Orland Penman office within a week to contact us and someone from our office will reach out to them.

## 2023-12-17 ENCOUNTER — Ambulatory Visit: Payer: Medicare Other | Admitting: Internal Medicine

## 2023-12-17 ENCOUNTER — Other Ambulatory Visit: Payer: Medicare Other

## 2023-12-17 ENCOUNTER — Encounter: Payer: Self-pay | Admitting: Internal Medicine

## 2023-12-18 ENCOUNTER — Telehealth: Payer: Self-pay | Admitting: *Deleted

## 2023-12-18 NOTE — Telephone Encounter (Signed)
Message left this afternoon for return call to see if patient was reviewed for scheduling for Arianna.

## 2023-12-18 NOTE — Telephone Encounter (Signed)
 Telephone call to Dr. Sharlet office [phone] and spoke with Arianna. Patients referral is on the providers desk for review and then he will be scheduled. Asked for a return call to advise when this is completed as this patient is scheduled on Thursday for treatment plan discussion and this office needs to see if this is an option for him. She agreed to notify Dr. Jeannett RN. Direct line provided.

## 2023-12-19 ENCOUNTER — Encounter: Payer: Self-pay | Admitting: *Deleted

## 2023-12-19 ENCOUNTER — Telehealth: Payer: Self-pay | Admitting: *Deleted

## 2023-12-19 NOTE — Telephone Encounter (Signed)
 Telephone call to Dr. Joannie office again to check status of referral. Patient will have a telephone visit with him next week. Dr. Has been out of the office for a little over a week. Dr. Darron left a message to have images powershared for viewing directly.  This RN sent a message to our radiology department to have images shared.  Patient called and advised status. He will come to appt with Dr. Sherrod tomorrow to continue plans for treatment and await appointment with Dr. Darron.

## 2023-12-20 ENCOUNTER — Inpatient Hospital Stay: Payer: Medicare Other | Attending: Internal Medicine

## 2023-12-20 ENCOUNTER — Inpatient Hospital Stay (HOSPITAL_BASED_OUTPATIENT_CLINIC_OR_DEPARTMENT_OTHER): Payer: Medicare Other | Admitting: Internal Medicine

## 2023-12-20 VITALS — BP 146/81 | HR 66 | Temp 97.6°F | Resp 16 | Ht 65.0 in | Wt 142.7 lb

## 2023-12-20 DIAGNOSIS — C3411 Malignant neoplasm of upper lobe, right bronchus or lung: Secondary | ICD-10-CM | POA: Insufficient documentation

## 2023-12-20 DIAGNOSIS — C3490 Malignant neoplasm of unspecified part of unspecified bronchus or lung: Secondary | ICD-10-CM

## 2023-12-20 DIAGNOSIS — C3491 Malignant neoplasm of unspecified part of right bronchus or lung: Secondary | ICD-10-CM

## 2023-12-20 LAB — CMP (CANCER CENTER ONLY)
ALT: 16 U/L (ref 0–44)
AST: 21 U/L (ref 15–41)
Albumin: 4 g/dL (ref 3.5–5.0)
Alkaline Phosphatase: 85 U/L (ref 38–126)
Anion gap: 6 (ref 5–15)
BUN: 23 mg/dL (ref 8–23)
CO2: 28 mmol/L (ref 22–32)
Calcium: 9.6 mg/dL (ref 8.9–10.3)
Chloride: 108 mmol/L (ref 98–111)
Creatinine: 1.27 mg/dL — ABNORMAL HIGH (ref 0.61–1.24)
GFR, Estimated: 57 mL/min — ABNORMAL LOW (ref >60)
Glucose, Bld: 98 mg/dL (ref 70–99)
Potassium: 4.1 mmol/L (ref 3.5–5.1)
Sodium: 142 mmol/L (ref 135–145)
Total Bilirubin: 0.5 mg/dL (ref 0.0–1.2)
Total Protein: 7.6 g/dL (ref 6.5–8.1)

## 2023-12-20 LAB — CBC WITH DIFFERENTIAL (CANCER CENTER ONLY)
Abs Immature Granulocytes: 0.03 10*3/uL (ref 0.00–0.07)
Basophils Absolute: 0 10*3/uL (ref 0.0–0.1)
Basophils Relative: 0 %
Eosinophils Absolute: 0.2 10*3/uL (ref 0.0–0.5)
Eosinophils Relative: 3 %
HCT: 44.6 % (ref 39.0–52.0)
Hemoglobin: 14.4 g/dL (ref 13.0–17.0)
Immature Granulocytes: 1 %
Lymphocytes Relative: 13 %
Lymphs Abs: 0.8 10*3/uL (ref 0.7–4.0)
MCH: 26.7 pg (ref 26.0–34.0)
MCHC: 32.3 g/dL (ref 30.0–36.0)
MCV: 82.6 fL (ref 80.0–100.0)
Monocytes Absolute: 0.5 10*3/uL (ref 0.1–1.0)
Monocytes Relative: 9 %
Neutro Abs: 4.2 10*3/uL (ref 1.7–7.7)
Neutrophils Relative %: 74 %
Platelet Count: 224 10*3/uL (ref 150–400)
RBC: 5.4 MIL/uL (ref 4.22–5.81)
RDW: 13.5 % (ref 11.5–15.5)
WBC Count: 5.6 10*3/uL (ref 4.0–10.5)
nRBC: 0 % (ref 0.0–0.2)

## 2023-12-20 NOTE — Progress Notes (Signed)
 [facility] [phone]   [phone]  OFFICE PROGRESS NOTE  [name], Charlie ORN, MD [address] [place] [address]  DIAGNOSIS:  1) Stage IIIb (T3, N2, M0) non-small cell lung cancer, squamous cell carcinoma presented with 2 separate right upper lobe lung nodule in addition to mediastinal lymphadenopathy diagnosed in January 2024. 2) left thyroid  nodule.  PDL1 expression: 1%   PRIOR THERAPY:  1) A course of concurrent chemoradiation with weekly carboplatin  for AUC of 2 and paclitaxel  45 Mg/M2. Last dose on 02/05/23. Status post 6 cycles. Taxol  changed to abraxane  starting from cycle #4 due to reaction to taxol   2) Consolidation immunotherapy with Imfinzi  1500 mg IV every 4 weeks.  First dose expected on 03/12/23.  Status post 7 cycles.   CURRENT THERAPY: Systemic chemotherapy with carboplatin  for AUC of 5 on day 1, Libtayo  (Cempilimab) 350 Mg IV on day 1 as well as gemcitabine  1000 Mg/M2 on days 1 and 8 every 3 weeks.  First dose January 03, 2024.   INTERVAL HISTORY: Alan Mendez 83 y.o. male is to the clinic today for follow-up visit accompanied by his wife.Discussed the use of AI scribe software for clinical note transcription with the patient, who gave verbal consent to proceed.  History of Present Illness   Alan Mendez is an 83 year old male with cancer who presents for consultation regarding treatment options for local recurrence. He has Stage IIIb (T3, N2, M0) non-small cell lung cancer, squamous cell carcinoma presented with 2 separate right upper lobe lung nodule in addition to mediastinal lymphadenopathy diagnosed in January 2024. He was treated with a course of concurrent chemoradiation with weekly carboplatin  for AUC of 2 and paclitaxel  45 Mg/M2. Last dose on 02/05/23. Status post 6 cycles. Taxol  changed to abraxane  starting from cycle #4 due to reaction to Paclitaxel  followed by Consolidation immunotherapy with Imfinzi  1500 mg IV  every 4 weeks.  First dose expected on 03/12/23.  Status post 7 cycles.   He has a local recurrence of cancer, which is not widely spread. He previously underwent radiation therapy, which is not recommended to be repeated.  He is concerned about the potential side effects of chemotherapy, particularly hair loss.  He has experienced some dysphagia since his previous radiation treatment, making it difficult to eat certain foods like steak. He manages this by eating more fish.  He remains physically active and feels well overall, despite the recurrence.       MEDICAL HISTORY: Past Medical History:  Diagnosis Date   Acquired solitary kidney    left --  s/p  right nephroureterctomy 01/ 2018   Anxiety    no current problems   Depression    no current problems   Elevated PSA    Enlarged lymph node 11/2022   enlarged right paratracheal lymph node.   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, negative margins)   History of kidney stones    passed stones and also surgery to remove   History of unilateral nephrectomy    01/ 2018  right nephroureterectomy for renal pelvis mass (carcinoma in situ)   HLD (hyperlipidemia)    on crestor    Hyperplasia of prostate without lower urinary tract symptoms (LUTS)  Lung cancer (HCC) 12/07/2022   Nephrolithiasis    bilateral non-obstructive   Pneumonia    x 1 - yrs ago   Pulmonary nodules 11/2022   2 right upper lobe pulmonary nodules   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HCC)    Renal cyst, acquired, right    right kidney removed   Wears glasses     ALLERGIES:  is allergic to paclitaxel , lamisil [terbinafine], and ivp dye [iodinated contrast media].  MEDICATIONS:  Current Outpatient Medications  Medication Sig Dispense  Refill   cholecalciferol  (VITAMIN D3) 25 MCG (1000 UNIT) tablet Take 1,000 Units by mouth daily.     diphenhydrAMINE  (BENADRYL ) 50 MG tablet Take 1 tablet 2 hours before the CT scan. 1 tablet 0   lidocaine -prilocaine  (EMLA ) cream Apply small amount cream to port a cath site 30-60 minutes prior to accessing port 30 g 0   Multiple Vitamins-Minerals (MULTIVITAMIN WITH MINERALS) tablet Take 1 tablet by mouth daily. Centrum     predniSONE  (DELTASONE ) 50 MG tablet 1 tablet p.o. 14 hours then 7 hours then 2 hours before the scan. Please take Benadryl  50 mg p.o. x 1 2 hours before the scan with the last dose of prednisone . 3 tablet 0   prochlorperazine  (COMPAZINE ) 10 MG tablet Take 10 mg by mouth every 6 (six) hours as needed for nausea or vomiting.     rosuvastatin  (CRESTOR ) 10 MG tablet TAKE 1 TABLET EACH DAY. (Patient taking differently: Take 20 mg by mouth daily.) 30 tablet 0   silver  sulfADIAZINE  (SILVADENE ) 1 % cream Apply 1 Application topically daily. 50 g 0   sucralfate  (CARAFATE ) 1 g tablet Take 1 tablet (1 g total) by mouth 4 (four) times daily -  with meals and at bedtime. Dissolve tablet in water  -swish and swallow. 30 tablet 2   tamsulosin  (FLOMAX ) 0.4 MG CAPS capsule Take 1 capsule (0.4 mg total) by mouth daily. (Patient taking differently: Take 0.4 mg by mouth daily after breakfast.) 30 capsule 5   No current facility-administered medications for this visit.    SURGICAL HISTORY:  Past Surgical History:  Procedure Laterality Date   APPENDECTOMY  child   BRONCHIAL BIOPSY  12/07/2022   Procedure: BRONCHIAL BIOPSIES;  Surgeon: Brenna Adine CROME, DO;  Location: [facility];  Service: Pulmonary;;   BRONCHIAL BRUSHINGS  12/07/2022   Procedure: BRONCHIAL BRUSHINGS;  Surgeon: Brenna Adine CROME, DO;  Location: [facility];  Service: Pulmonary;;   BRONCHIAL NEEDLE ASPIRATION BIOPSY  12/07/2022   Procedure: BRONCHIAL NEEDLE ASPIRATION BIOPSIES;  Surgeon: Brenna Adine CROME, DO;  Location: [facility];  Service: Pulmonary;;   CATARACT EXTRACTION W/ INTRAOCULAR LENS  IMPLANT, BILATERAL  2017   COLONOSCOPY  08/15/2012   Tubular Adenoma, No high grade dysplasia or malignacy.   CYSTOSCOPY W/ RETROGRADES Right 09/25/2016   Procedure: CYSTOSCOPY, URETHRAL DILITATION  WITH RETROGRADE PYELOGRAM, BRUSH BIOPSIES OF RIGHT RENAL MASS;  Surgeon: Arlena Gal, MD;  Location[facility];  Service: Urology;  Laterality: Right;   CYSTOSCOPY W/ RETROGRADES Left 03/16/2017   Procedure: CYSTOSCOPY WITH RETROGRADE PYELOGRAM;  Surgeon: Cam Morene ORN, MD;  Location: [facility];  Service: Urology;  Laterality: Left;   CYSTOSCOPY W/ URETERAL STENT REMOVAL Right 09/25/2016   Procedure: CYSTOSCOPY WITH STENT REMOVAL;  Surgeon: Arlena Gal, MD;  Location: [facility];  Service: Urology;  Laterality: Right;   CYSTOSCOPY WITH RETROGRADE PYELOGRAM, URETEROSCOPY AND STENT PLACEMENT Right 08/21/2016   Procedure: CYSTOSCOPY WITH RIGHT RETROGRADE PYELOGRAM, RIGHT FLEXIBLE AND  RIGID URETEROSCOPY, INSERTION DOUBLE J STENT RIGHT;  Surgeon: Arlena Gal, MD;  Location: [facility];  Service: Urology;  Laterality: Right;   EXTRACORPOREAL SHOCK WAVE LITHOTRIPSY  2009   IR IMAGING GUIDED PORT INSERTION  07/09/2023   KNEE SURGERY Right 1980's   ROBOT ASSITED LAPAROSCOPIC NEPHROURETERECTOMY Right 11/15/2016   Procedure: RIGHT XI ROBOT ASSITED LAPAROSCOPIC NEPHROURETERECTOMY;  Surgeon: Morene LELON Salines, MD;  Location: [facility];  Service: Urology;  Laterality: Right;   TRANSURETHRAL RESECTION OF BLADDER TUMOR WITH MITOMYCIN -C Left 03/16/2017   Procedure: CYSTOSCOPY BIOPSIES OF BLADDER TUMOR WITH FULGURATION;  Surgeon: Salines Morene LELON, MD;  Location: [facility];  Service: Urology;  Laterality: Left;   URETEROSCOPY Right 09/25/2016   Procedure: RIGHT URETEROSCOPY;  Surgeon: Arlena Gal, MD;  Location: [facility]  Service: Urology;  Laterality: Right;   VIDEO BRONCHOSCOPY WITH ENDOBRONCHIAL ULTRASOUND Right 12/07/2022   Procedure: VIDEO BRONCHOSCOPY WITH ENDOBRONCHIAL ULTRASOUND;  Surgeon: Brenna Adine CROME, DO;  Location: [facility];  Service: Pulmonary;  Laterality: Right;    REVIEW OF SYSTEMS:  Constitutional: positive for fatigue Eyes: negative Ears, nose, mouth, throat, and face: negative Respiratory: negative Cardiovascular: negative Gastrointestinal: negative Genitourinary:negative Integument/breast: negative Hematologic/lymphatic: negative Musculoskeletal:negative Neurological: negative Behavioral/Psych: positive for anxiety Endocrine: negative Allergic/Immunologic: negative   PHYSICAL EXAMINATION: General appearance: alert, cooperative, and no distress Head: Normocephalic, without obvious abnormality, atraumatic Neck: no adenopathy, no JVD, supple, symmetrical, trachea midline, and thyroid  not enlarged, symmetric, no tenderness/mass/nodules Lymph nodes: Cervical, supraclavicular, and axillary nodes normal. Resp: clear to auscultation bilaterally Back: symmetric, no curvature. ROM normal. No CVA tenderness. Cardio: regular rate and rhythm, S1, S2 normal, no murmur, click, rub or gallop GI: soft, non-tender; bowel sounds normal; no masses,  no organomegaly Extremities: extremities normal, atraumatic, no cyanosis or edema Neurologic: Alert and oriented X 3, normal strength and tone. Normal symmetric reflexes. Normal coordination and gait  ECOG PERFORMANCE STATUS: 1 - Symptomatic but completely ambulatory  Blood pressure (!) 146/81, pulse 66, temperature 97.6 F (36.4 C), temperature source Temporal, resp. rate 16, height 5' 5 (1.651 m), weight 142 lb 11.2 oz (64.7 kg), SpO2 100%.  LABORATORY DATA: Lab Results  Component Value Date   WBC 5.6 12/20/2023   HGB 14.4 12/20/2023   HCT 44.6 12/20/2023   MCV 82.6 12/20/2023   PLT 224 12/20/2023      Chemistry       Component Value Date/Time   NA 138 12/05/2023 0958   K 3.7 12/05/2023 0958   CL 106 12/05/2023 0958   CO2 26 12/05/2023 0958   BUN 19 12/05/2023 0958   CREATININE 1.00 12/05/2023 0958   CREATININE 0.85 12/10/2014 1550      Component Value Date/Time   CALCIUM  9.3 12/05/2023 0958   ALKPHOS 86 12/05/2023 0958   AST 21 12/05/2023 0958   ALT 16 12/05/2023 0958   BILITOT 0.6 12/05/2023 0958       RADIOGRAPHIC STUDIES: No results found.  ASSESSMENT AND PLAN: This is a very pleasant 83 years old white male with Stage IIIb (T3, N2, M0) non-small cell lung cancer, squamous cell carcinoma presented with 2 separate right upper lobe lung nodule in addition to mediastinal lymphadenopathy diagnosed in January 2024. The patient has no evidence of metastatic disease to the brain or extrathoracic metastasis besides the 2 right upper lobe pulmonary nodule and mediastinal lymphadenopathy. I discussed his case with Dr. Kerrin, cardiothoracic surgery and he indicated that because of the location of the lymph node,  the patient will not be a great candidate for surgical resection with negative margin. The patient underwent a course of concurrent chemoradiation with weekly carboplatin  for AUC of 2 and paclitaxel  45 Mg/M2.  Status post 6 cycles.  His treatment with paclitaxel  was changed to Abraxane  starting from cycle #3 with secondary to hypersensitivity reaction. He tolerated this treatment fairly well with partial response. He underwent consolidation treatment with immunotherapy with Imfinzi  1500 Mg IV every 4 weeks status post 7 cycles.  This treatment was discontinued secondary to disease progression. The patient was seen by thoracic surgery at [facility] for consideration of surgical resection but he was not a surgical candidate.    Recurrent Localized Cancer Localized recurrence of cancer. Previous radiation therapy was ineffective. Disease remains localized without widespread metastasis.  Experiencing dysphagia from prior radiation but remains physically active. Discussed three treatment options: repeating radiation (not feasible), pulsed electric field (PEF) therapy, and chemotherapy. PEF therapy to be evaluated by Dr. Darron. If PEF is not feasible, chemotherapy with carboplatin , gemcitabine , and Libtayo  will be initiated. Discussed risks and benefits of chemotherapy, including hair loss and neuropathy. Emphasized the importance of maintaining quality of life and physical activity. Patient prefers treatment minimizing hair loss. - Consult with Dr. Darron regarding PEF therapy feasibility. Appointment scheduled for next Thursday. - If PEF therapy is feasible, proceed with the procedure and delay chemotherapy until two weeks post-procedure. - If PEF therapy is not feasible or significantly delayed, initiate chemotherapy with carboplatin , gemcitabine , and Libtayo . - Monitor response to treatment every nine weeks.  Chemotherapy Side Effects Management Patient prefers treatment minimizing hair loss and maintaining quality of life. Discussed two chemotherapy options: carboplatin , paclitaxel , and Keytruda (more aggressive, higher risk of hair loss and neuropathy) versus carboplatin , gemcitabine , and Libtayo  (less aggressive, lower risk of hair loss and neuropathy). Patient leans towards the latter due to lower side effects and maintaining appearance. - If chemotherapy is initiated, use carboplatin , gemcitabine , and Libtayo  to minimize hair loss and neuropathy. - Monitor for side effects and adjust treatment as necessary.  Emotional Distress Experiencing emotional distress and depression due to cancer progression and treatment uncertainty. Concerns about treatment effectiveness and quality of life impact. Expressed frustration with previous treatment outcomes and delays. - Provide emotional support and reassurance about available treatment options. - Encourage staying active and  maintaining physical health. - Offer counseling services if needed.  Follow-up - Follow up with Dr. Darron next Thursday to discuss PEF therapy feasibility. - Schedule chemotherapy to start a week after the consultation if PEF therapy is not feasible. - Monitor response to treatment every nine weeks. - Contact to discuss next steps based on Dr. Joannie assessment.   He was advised to call immediately if he has any other concerning symptoms in the interval. The patient voices understanding of current disease status and treatment options and is in agreement with the current care plan.  All questions were answered. The patient knows to call the clinic with any problems, questions or concerns. We can certainly see the patient much sooner if necessary.  The total time spent in the appointment was 55 minutes.  Disclaimer: This note was dictated with voice recognition software. Similar sounding words can inadvertently be transcribed and may not be corrected upon review.

## 2023-12-20 NOTE — Progress Notes (Signed)
 DISCONTINUE ON PATHWAY REGIMEN - Non-Small Cell Lung     A cycle is every 28 days:     Durvalumab    **Always confirm dose/schedule in your pharmacy ordering system**  PRIOR TREATMENT: [id number]: Durvalumab  1,500 mg q28 Days x up to 12 Months  START OFF PATHWAY REGIMEN - Non-Small Cell Lung   [id number]:Carboplatin  + Gemcitabine  (5.5/1,000):   A cycle is every 21 days:     Carboplatin       Gemcitabine    **Always confirm dose/schedule in your pharmacy ordering system**  Patient Characteristics: Local Recurrence Therapeutic Status: Local Recurrence Intent of Therapy: Non-Curative / Palliative Intent, Discussed with Patient

## 2023-12-21 ENCOUNTER — Other Ambulatory Visit: Payer: Self-pay

## 2023-12-22 ENCOUNTER — Other Ambulatory Visit: Payer: Self-pay

## 2023-12-25 ENCOUNTER — Other Ambulatory Visit: Payer: Self-pay

## 2023-12-26 NOTE — Progress Notes (Signed)

## 2023-12-27 ENCOUNTER — Encounter: Payer: Self-pay | Admitting: Internal Medicine

## 2023-12-27 DIAGNOSIS — R590 Localized enlarged lymph nodes: Secondary | ICD-10-CM | POA: Diagnosis not present

## 2023-12-27 DIAGNOSIS — R918 Other nonspecific abnormal finding of lung field: Secondary | ICD-10-CM | POA: Diagnosis not present

## 2023-12-27 DIAGNOSIS — C3411 Malignant neoplasm of upper lobe, right bronchus or lung: Secondary | ICD-10-CM | POA: Diagnosis not present

## 2023-12-28 ENCOUNTER — Encounter: Payer: Self-pay | Admitting: Internal Medicine

## 2023-12-28 ENCOUNTER — Telehealth: Payer: Self-pay

## 2023-12-28 NOTE — Telephone Encounter (Signed)
Spoke with patient this morning.  Patient states that he has spoke with Dr. Doyce Para in [place] and is planning to proceed with procedure on 2/28. Patient has an appt to start chemo on 2/20 and had questions about delaying chemo until after the procedure. Patient stated that a dietician spoke with him his last treatment and would like to continue with a dietician when starting chemo. Patient stated that Dr. Doyce Para would reach out to Dr. Arbutus Ped.  Informed patient I will relay message to Dr. Arbutus Ped and reach back out once plan is in place.

## 2023-12-30 ENCOUNTER — Other Ambulatory Visit: Payer: Self-pay | Admitting: Internal Medicine

## 2023-12-31 ENCOUNTER — Encounter: Payer: Self-pay | Admitting: Internal Medicine

## 2023-12-31 NOTE — Telephone Encounter (Signed)
 Informed patient- Per Dr. Arbutus Ped- delaying treatment a couple weeks after procedure to recover properly. Canceled treatments on 2/20 and 2/27.  Patient is scheduled for 3/13 to start treatment. Patient verbalized understanding.

## 2024-01-01 ENCOUNTER — Encounter: Payer: Self-pay | Admitting: Internal Medicine

## 2024-01-02 ENCOUNTER — Other Ambulatory Visit: Payer: Self-pay

## 2024-01-03 ENCOUNTER — Other Ambulatory Visit: Payer: Medicare Other

## 2024-01-03 ENCOUNTER — Ambulatory Visit: Payer: Medicare Other | Admitting: Internal Medicine

## 2024-01-03 ENCOUNTER — Ambulatory Visit: Payer: Medicare Other

## 2024-01-10 ENCOUNTER — Ambulatory Visit: Payer: Medicare Other

## 2024-01-10 ENCOUNTER — Other Ambulatory Visit: Payer: Medicare Other

## 2024-01-10 ENCOUNTER — Ambulatory Visit: Payer: Medicare Other | Admitting: Physician Assistant

## 2024-01-11 DIAGNOSIS — R590 Localized enlarged lymph nodes: Secondary | ICD-10-CM | POA: Diagnosis not present

## 2024-01-11 DIAGNOSIS — J9809 Other diseases of bronchus, not elsewhere classified: Secondary | ICD-10-CM | POA: Diagnosis not present

## 2024-01-11 DIAGNOSIS — Z87891 Personal history of nicotine dependence: Secondary | ICD-10-CM | POA: Diagnosis not present

## 2024-01-11 DIAGNOSIS — Z8616 Personal history of COVID-19: Secondary | ICD-10-CM | POA: Diagnosis not present

## 2024-01-11 DIAGNOSIS — D381 Neoplasm of uncertain behavior of trachea, bronchus and lung: Secondary | ICD-10-CM | POA: Diagnosis not present

## 2024-01-11 DIAGNOSIS — R918 Other nonspecific abnormal finding of lung field: Secondary | ICD-10-CM | POA: Diagnosis not present

## 2024-01-11 DIAGNOSIS — Z01810 Encounter for preprocedural cardiovascular examination: Secondary | ICD-10-CM | POA: Diagnosis not present

## 2024-01-11 DIAGNOSIS — C3411 Malignant neoplasm of upper lobe, right bronchus or lung: Secondary | ICD-10-CM | POA: Diagnosis not present

## 2024-01-11 DIAGNOSIS — J984 Other disorders of lung: Secondary | ICD-10-CM | POA: Diagnosis not present

## 2024-01-11 DIAGNOSIS — E785 Hyperlipidemia, unspecified: Secondary | ICD-10-CM | POA: Diagnosis not present

## 2024-01-11 DIAGNOSIS — E041 Nontoxic single thyroid nodule: Secondary | ICD-10-CM | POA: Diagnosis not present

## 2024-01-11 DIAGNOSIS — Z48813 Encounter for surgical aftercare following surgery on the respiratory system: Secondary | ICD-10-CM | POA: Diagnosis not present

## 2024-01-11 DIAGNOSIS — Z8701 Personal history of pneumonia (recurrent): Secondary | ICD-10-CM | POA: Diagnosis not present

## 2024-01-11 DIAGNOSIS — R9431 Abnormal electrocardiogram [ECG] [EKG]: Secondary | ICD-10-CM | POA: Diagnosis not present

## 2024-01-12 ENCOUNTER — Encounter: Payer: Self-pay | Admitting: Internal Medicine

## 2024-01-14 ENCOUNTER — Encounter: Payer: Self-pay | Admitting: Medical Oncology

## 2024-01-14 ENCOUNTER — Encounter: Payer: Self-pay | Admitting: Internal Medicine

## 2024-01-17 DIAGNOSIS — C3411 Malignant neoplasm of upper lobe, right bronchus or lung: Secondary | ICD-10-CM | POA: Diagnosis not present

## 2024-01-21 ENCOUNTER — Encounter: Payer: Self-pay | Admitting: Internal Medicine

## 2024-01-21 ENCOUNTER — Telehealth: Payer: Self-pay | Admitting: Internal Medicine

## 2024-01-21 ENCOUNTER — Other Ambulatory Visit: Payer: Self-pay | Admitting: Physician Assistant

## 2024-01-21 NOTE — Telephone Encounter (Signed)
 Called to cancel his appointments per MD request.

## 2024-01-23 ENCOUNTER — Telehealth: Payer: Self-pay

## 2024-01-23 NOTE — Telephone Encounter (Signed)
 Patient previously seen at Dr. Tomasita Morrow office in [place] and procedure wasn't successful (01/11/2024). Dr. Tomasita Morrow referred patient to [facility]. Patient reports that he has not heard from Dr. Clementeen Graham at [facility]. Informed patient our office will try to get in touch with [facility] and get some information on follow up and appts. Patient verbalized understanding.

## 2024-01-24 ENCOUNTER — Inpatient Hospital Stay: Payer: Medicare Other

## 2024-01-24 ENCOUNTER — Inpatient Hospital Stay: Payer: Medicare Other | Admitting: Internal Medicine

## 2024-01-24 ENCOUNTER — Telehealth: Payer: Self-pay | Admitting: Medical Oncology

## 2024-01-24 NOTE — Telephone Encounter (Signed)
 Pt is scheduled for a new consult video visit with  Dr. Dennison Nancy on March 17th .Alan Mendez was notified.

## 2024-01-24 NOTE — Telephone Encounter (Signed)
 As of today , [facility] does not have a referral for Mr. Dooly. Called Dr. Orland Penman  office. Overton Mam will contact Dr Debera Lat

## 2024-01-28 DIAGNOSIS — C3491 Malignant neoplasm of unspecified part of right bronchus or lung: Secondary | ICD-10-CM | POA: Diagnosis not present

## 2024-01-29 ENCOUNTER — Other Ambulatory Visit: Payer: Self-pay | Admitting: Physician Assistant

## 2024-01-29 ENCOUNTER — Telehealth: Payer: Self-pay | Admitting: *Deleted

## 2024-01-29 ENCOUNTER — Encounter: Payer: Self-pay | Admitting: Internal Medicine

## 2024-01-29 NOTE — Telephone Encounter (Signed)
 Mr. Alan Mendez called office to inform Dr. Arbutus Ped that he is having procedure at [facility] on 02/19/2024.  Patient said that per Dr. Clementeen Graham, he will be able to have chemotherapy 2-3 weeks post procedure. Mr. Alan Mendez wanted Dr. Arbutus Ped to know when his procedure was so that chemotherapy could be scheduled accordingly.Marland Kitchen He currently has appointments scheduled 3/20, 4/3, 4/10, 4/24, 5/1, 5/15, 5/22.    Dr. Arbutus Ped given information with request to advise on rescheduling appts.

## 2024-01-30 ENCOUNTER — Other Ambulatory Visit: Payer: Self-pay | Admitting: Physician Assistant

## 2024-01-30 ENCOUNTER — Encounter: Payer: Self-pay | Admitting: Internal Medicine

## 2024-01-30 ENCOUNTER — Other Ambulatory Visit: Payer: Self-pay

## 2024-01-31 ENCOUNTER — Inpatient Hospital Stay: Payer: Medicare Other | Admitting: Internal Medicine

## 2024-01-31 ENCOUNTER — Inpatient Hospital Stay: Payer: Medicare Other

## 2024-01-31 ENCOUNTER — Other Ambulatory Visit: Payer: Self-pay

## 2024-02-04 ENCOUNTER — Encounter: Payer: Self-pay | Admitting: Internal Medicine

## 2024-02-06 ENCOUNTER — Other Ambulatory Visit: Payer: Self-pay

## 2024-02-12 DIAGNOSIS — Z5181 Encounter for therapeutic drug level monitoring: Secondary | ICD-10-CM | POA: Diagnosis not present

## 2024-02-12 DIAGNOSIS — J939 Pneumothorax, unspecified: Secondary | ICD-10-CM | POA: Diagnosis not present

## 2024-02-12 DIAGNOSIS — C3491 Malignant neoplasm of unspecified part of right bronchus or lung: Secondary | ICD-10-CM | POA: Diagnosis not present

## 2024-02-12 DIAGNOSIS — J95811 Postprocedural pneumothorax: Secondary | ICD-10-CM | POA: Diagnosis not present

## 2024-02-12 DIAGNOSIS — R918 Other nonspecific abnormal finding of lung field: Secondary | ICD-10-CM | POA: Diagnosis not present

## 2024-02-12 DIAGNOSIS — C3411 Malignant neoplasm of upper lobe, right bronchus or lung: Secondary | ICD-10-CM | POA: Diagnosis not present

## 2024-02-12 DIAGNOSIS — Z79899 Other long term (current) drug therapy: Secondary | ICD-10-CM | POA: Diagnosis not present

## 2024-02-14 ENCOUNTER — Other Ambulatory Visit: Payer: Medicare Other

## 2024-02-14 ENCOUNTER — Ambulatory Visit: Payer: Medicare Other | Admitting: Physician Assistant

## 2024-02-14 ENCOUNTER — Ambulatory Visit: Payer: Medicare Other

## 2024-02-16 ENCOUNTER — Other Ambulatory Visit: Payer: Self-pay

## 2024-02-21 ENCOUNTER — Ambulatory Visit: Payer: Medicare Other | Admitting: Internal Medicine

## 2024-02-21 ENCOUNTER — Other Ambulatory Visit: Payer: Medicare Other

## 2024-02-21 ENCOUNTER — Ambulatory Visit: Payer: Medicare Other

## 2024-02-29 ENCOUNTER — Telehealth: Payer: Self-pay | Admitting: Medical Oncology

## 2024-02-29 ENCOUNTER — Encounter: Payer: Self-pay | Admitting: Internal Medicine

## 2024-03-03 ENCOUNTER — Encounter: Payer: Self-pay | Admitting: Internal Medicine

## 2024-03-03 NOTE — Telephone Encounter (Signed)
 Discussed treatment side effects for cemiplimab  , carboplatin  and Gemzar .

## 2024-03-05 ENCOUNTER — Encounter: Payer: Self-pay | Admitting: Internal Medicine

## 2024-03-05 ENCOUNTER — Other Ambulatory Visit: Payer: Self-pay | Admitting: Physician Assistant

## 2024-03-05 MED FILL — Fosaprepitant Dimeglumine For IV Infusion 150 MG (Base Eq): INTRAVENOUS | Qty: 5 | Status: AC

## 2024-03-06 ENCOUNTER — Inpatient Hospital Stay: Payer: Medicare Other

## 2024-03-06 ENCOUNTER — Inpatient Hospital Stay: Payer: Medicare Other | Attending: Internal Medicine | Admitting: Internal Medicine

## 2024-03-06 VITALS — BP 142/85 | HR 78 | Temp 98.1°F | Resp 16 | Ht 65.0 in | Wt 141.1 lb

## 2024-03-06 DIAGNOSIS — C3411 Malignant neoplasm of upper lobe, right bronchus or lung: Secondary | ICD-10-CM | POA: Insufficient documentation

## 2024-03-06 DIAGNOSIS — Z95828 Presence of other vascular implants and grafts: Secondary | ICD-10-CM

## 2024-03-06 DIAGNOSIS — Z5111 Encounter for antineoplastic chemotherapy: Secondary | ICD-10-CM | POA: Insufficient documentation

## 2024-03-06 DIAGNOSIS — Z5112 Encounter for antineoplastic immunotherapy: Secondary | ICD-10-CM | POA: Insufficient documentation

## 2024-03-06 DIAGNOSIS — C679 Malignant neoplasm of bladder, unspecified: Secondary | ICD-10-CM

## 2024-03-06 DIAGNOSIS — C3491 Malignant neoplasm of unspecified part of right bronchus or lung: Secondary | ICD-10-CM

## 2024-03-06 DIAGNOSIS — E041 Nontoxic single thyroid nodule: Secondary | ICD-10-CM | POA: Diagnosis not present

## 2024-03-06 DIAGNOSIS — C3490 Malignant neoplasm of unspecified part of unspecified bronchus or lung: Secondary | ICD-10-CM

## 2024-03-06 DIAGNOSIS — R5383 Other fatigue: Secondary | ICD-10-CM | POA: Diagnosis not present

## 2024-03-06 LAB — CBC WITH DIFFERENTIAL (CANCER CENTER ONLY)
Abs Immature Granulocytes: 0.03 10*3/uL (ref 0.00–0.07)
Basophils Absolute: 0 10*3/uL (ref 0.0–0.1)
Basophils Relative: 1 %
Eosinophils Absolute: 0.1 10*3/uL (ref 0.0–0.5)
Eosinophils Relative: 2 %
HCT: 38.6 % — ABNORMAL LOW (ref 39.0–52.0)
Hemoglobin: 13.1 g/dL (ref 13.0–17.0)
Immature Granulocytes: 1 %
Lymphocytes Relative: 11 %
Lymphs Abs: 0.7 10*3/uL (ref 0.7–4.0)
MCH: 27.3 pg (ref 26.0–34.0)
MCHC: 33.9 g/dL (ref 30.0–36.0)
MCV: 80.4 fL (ref 80.0–100.0)
Monocytes Absolute: 0.5 10*3/uL (ref 0.1–1.0)
Monocytes Relative: 8 %
Neutro Abs: 5.2 10*3/uL (ref 1.7–7.7)
Neutrophils Relative %: 77 %
Platelet Count: 255 10*3/uL (ref 150–400)
RBC: 4.8 MIL/uL (ref 4.22–5.81)
RDW: 13 % (ref 11.5–15.5)
WBC Count: 6.6 10*3/uL (ref 4.0–10.5)
nRBC: 0 % (ref 0.0–0.2)

## 2024-03-06 LAB — CMP (CANCER CENTER ONLY)
ALT: 15 U/L (ref 0–44)
AST: 18 U/L (ref 15–41)
Albumin: 4 g/dL (ref 3.5–5.0)
Alkaline Phosphatase: 96 U/L (ref 38–126)
Anion gap: 4 — ABNORMAL LOW (ref 5–15)
BUN: 18 mg/dL (ref 8–23)
CO2: 28 mmol/L (ref 22–32)
Calcium: 9 mg/dL (ref 8.9–10.3)
Chloride: 107 mmol/L (ref 98–111)
Creatinine: 0.97 mg/dL (ref 0.61–1.24)
GFR, Estimated: >60 mL/min (ref >60)
Glucose, Bld: 95 mg/dL (ref 70–99)
Potassium: 4.1 mmol/L (ref 3.5–5.1)
Sodium: 139 mmol/L (ref 135–145)
Total Bilirubin: 0.6 mg/dL (ref 0.0–1.2)
Total Protein: 7.3 g/dL (ref 6.5–8.1)

## 2024-03-06 MED ORDER — FOSAPREPITANT DIMEGLUMINE INJECTION 150 MG
150.0000 mg | Freq: Once | INTRAVENOUS | Status: AC
  Administered 2024-03-06: 150 mg via INTRAVENOUS
  Filled 2024-03-06: qty 150

## 2024-03-06 MED ORDER — DEXAMETHASONE SODIUM PHOSPHATE 10 MG/ML IJ SOLN
10.0000 mg | Freq: Once | INTRAMUSCULAR | Status: AC
  Administered 2024-03-06: 10 mg via INTRAVENOUS
  Filled 2024-03-06: qty 1

## 2024-03-06 MED ORDER — SODIUM CHLORIDE 0.9 % IV SOLN: INTRAVENOUS | Status: DC

## 2024-03-06 MED ORDER — SODIUM CHLORIDE 0.9 % IV SOLN
1000.0000 mg/m2 | Freq: Once | INTRAVENOUS | Status: AC
  Administered 2024-03-06: 1710 mg via INTRAVENOUS
  Filled 2024-03-06: qty 44.97

## 2024-03-06 MED ORDER — SODIUM CHLORIDE 0.9 % IV SOLN
350.0000 mg | Freq: Once | INTRAVENOUS | Status: AC
  Administered 2024-03-06: 350 mg via INTRAVENOUS
  Filled 2024-03-06: qty 7

## 2024-03-06 MED ORDER — PALONOSETRON HCL INJECTION 0.25 MG/5ML
0.2500 mg | Freq: Once | INTRAVENOUS | Status: AC
  Administered 2024-03-06: 0.25 mg via INTRAVENOUS
  Filled 2024-03-06: qty 5

## 2024-03-06 MED ORDER — HEPARIN SOD (PORK) LOCK FLUSH 100 UNIT/ML IV SOLN
500.0000 [IU] | Freq: Once | INTRAVENOUS | Status: AC | PRN
  Administered 2024-03-06: 500 [IU]

## 2024-03-06 MED ORDER — SODIUM CHLORIDE 0.9% FLUSH
10.0000 mL | Freq: Once | INTRAVENOUS | Status: AC
  Administered 2024-03-06: 10 mL

## 2024-03-06 MED ORDER — SODIUM CHLORIDE 0.9% FLUSH
10.0000 mL | INTRAVENOUS | Status: DC | PRN
  Administered 2024-03-06: 10 mL

## 2024-03-06 MED ORDER — SODIUM CHLORIDE 0.9 % IV SOLN
390.0000 mg | Freq: Once | INTRAVENOUS | Status: AC
  Administered 2024-03-06: 390 mg via INTRAVENOUS
  Filled 2024-03-06: qty 39

## 2024-03-06 NOTE — Patient Instructions (Signed)
 [facility] - A DEPT OF [facility]  Discharge Instructions: Thank you for choosing [facility] to provide your oncology and hematology care.   If you have a lab appointment with the Cancer Center, please go directly to the Cancer Center and check in at the registration area.   Wear comfortable clothing and clothing appropriate for easy access to any Portacath or PICC line.   We strive to give you quality time with your provider. You may need to reschedule your appointment if you arrive late (15 or more minutes).  Arriving late affects you and other patients whose appointments are after yours.  Also, if you miss three or more appointments without notifying the office, you may be dismissed from the clinic at the provider's discretion.      For prescription refill requests, have your pharmacy contact our office and allow 72 hours for refills to be completed.    Today you received the following chemotherapy and/or immunotherapy agents: Cemiplimab , Gemcitabine , Carboplatin       To help prevent nausea and vomiting after your treatment, we encourage you to take your nausea medication as directed.  BELOW ARE SYMPTOMS THAT SHOULD BE REPORTED IMMEDIATELY: *FEVER GREATER THAN 100.4 F (38 C) OR HIGHER *CHILLS OR SWEATING *NAUSEA AND VOMITING THAT IS NOT CONTROLLED WITH YOUR NAUSEA MEDICATION *UNUSUAL SHORTNESS OF BREATH *UNUSUAL BRUISING OR BLEEDING *URINARY PROBLEMS (pain or burning when urinating, or frequent urination) *BOWEL PROBLEMS (unusual diarrhea, constipation, pain near the anus) TENDERNESS IN MOUTH AND THROAT WITH OR WITHOUT PRESENCE OF ULCERS (sore throat, sores in mouth, or a toothache) UNUSUAL RASH, SWELLING OR PAIN  UNUSUAL VAGINAL DISCHARGE OR ITCHING   Items with * indicate a potential emergency and should be followed up as soon as possible or go to the Emergency Department if any problems should occur.  Please show the CHEMOTHERAPY  ALERT CARD or IMMUNOTHERAPY ALERT CARD at check-in to the Emergency Department and triage nurse.  Should you have questions after your visit or need to cancel or reschedule your appointment, please contact [facility] - A DEPT OF [facility]  Dept: [phone]  and follow the prompts.  Office hours are 8:00 a.m. to 4:30 p.m. Monday - Friday. Please note that voicemails left after 4:00 p.m. may not be returned until the following business day.  We are closed weekends and major holidays. You have access to a nurse at all times for urgent questions. Please call the main number to the clinic Dept: [phone] and follow the prompts.   For any non-urgent questions, you may also contact your provider using MyChart. We now offer e-Visits for anyone 3 and older to request care online for non-urgent symptoms. For details visit [email or web address].   Also download the MyChart app! Go to the app store, search "MyChart", open the app, select [place], and log in with your MyChart username and password.  Cemiplimab  Injection What is this medication? CEMIPLIMAB  (se MIP li mab) treats skin cancer and lung cancer. It works by helping your immune system slow or stop the spread of cancer cells. It is a monoclonal antibody. This medicine may be used for other purposes; ask your health care provider or pharmacist if you have questions. COMMON BRAND NAME(S): LIBTAYO  What should I tell my care team before I take this medication? They need to know if you have any of these conditions: Allogeneic stem cell transplant (uses someone else's  stem cells) Autoimmune diseases, such as Crohn disease, ulcerative colitis, lupus History of chest radiation Nervous system problems, such as Guillain-Barre syndrome or myasthenia gravis Organ transplant An unusual or allergic reaction to cemiplimab , other medications, foods, dyes, or preservatives Pregnant or trying to get  pregnant Breastfeeding How should I use this medication? This medication is infused into a vein. It is given by your care team in a hospital or clinic setting. A special MedGuide will be given to you before each treatment. Be sure to read this information carefully each time. Talk to your care team about the use of this medication in children. Special care may be needed. Overdosage: If you think you have taken too much of this medicine contact a poison control center or emergency room at once. NOTE: This medicine is only for you. Do not share this medicine with others. What if I miss a dose? Keep appointments for follow-up doses. It is important not to miss your dose. Call your care team if you are unable to keep an appointment. What may interact with this medication? Interactions have not been studied. This list may not describe all possible interactions. Give your health care provider a list of all the medicines, herbs, non-prescription drugs, or dietary supplements you use. Also tell them if you smoke, drink alcohol , or use illegal drugs. Some items may interact with your medicine. What should I watch for while using this medication? Visit your care team for regular checks on your progress. It may be some time before you see the benefit from this medication. You may need blood work done while taking this medication. This medication may cause serious skin reactions. They can happen weeks to months after starting the medication. Contact your care team right away if you notice fevers or flu-like symptoms with a rash. The rash may be red or purple and then turn into blisters or peeling of the skin. You may also notice a red rash with swelling of the face, lips, or lymph nodes in your neck or under your arms. Tell your care team right away if you have any change in your eyesight. Talk to your care team if you may be pregnant. You will need a negative pregnancy test before starting this medication.  Contraception is recommended while taking this medication and for 4 months after the last dose. Your care team can help you find the option that works for you. Do not breastfeed while taking this medication and for at least 4 months after the last dose. What side effects may I notice from receiving this medication? Side effects that you should report to your care team as soon as possible: Allergic reactions--skin rash, itching, hives, swelling of the face, lips, tongue, or throat Dry cough, shortness of breath or trouble breathing Eye pain, redness, irritation, or discharge with blurry or decreased vision Heart muscle inflammation--unusual weakness or fatigue, shortness of breath, chest pain, fast or irregular heartbeat, dizziness, swelling of the ankles, feet, or hands Hormone gland problems--headache, sensitivity to light, unusual weakness or fatigue, dizziness, fast or irregular heartbeat, increased sensitivity to cold or heat, excessive sweating, constipation, hair loss, increased thirst or amount of urine, tremors or shaking, irritability Infusion reactions--chest pain, shortness of breath or trouble breathing, feeling faint or lightheaded Kidney injury (glomerulonephritis)--decrease in the amount of urine, red or dark brown urine, foamy or bubbly urine, swelling of the ankles, hands, or feet Liver injury--right upper belly pain, loss of appetite, nausea, light-colored stool, dark yellow or brown urine,  yellowing skin or eyes, unusual weakness or fatigue Pain, tingling, or numbness in the hands or feet, muscle weakness, change in vision, confusion or trouble speaking, loss of balance or coordination, trouble walking, seizures Rash, fever, and swollen lymph nodes Redness, blistering, peeling, or loosening of the skin, including inside the mouth Sudden or severe stomach pain, bloody diarrhea, fever, nausea, vomiting Side effects that usually do not require medical attention (report these to your  care team if they continue or are bothersome): Bone, joint, or muscle pain Diarrhea Fatigue Loss of appetite Nausea Skin rash This list may not describe all possible side effects. Call your doctor for medical advice about side effects. You may report side effects to FDA at 1-800-FDA-1088. Where should I keep my medication? This medication is given in a hospital or clinic and will not be stored at home. NOTE: This sheet is a summary. It may not cover all possible information. If you have questions about this medicine, talk to your doctor, pharmacist, or health care provider.  2024 Elsevier/Gold Standard (2022-12-18 00:00:00)  Gemcitabine  Injection What is this medication? GEMCITABINE  (jem SYE ta been) treats some types of cancer. It works by slowing down the growth of cancer cells. This medicine may be used for other purposes; ask your health care provider or pharmacist if you have questions. COMMON BRAND NAME(S): Gemzar , Infugem  What should I tell my care team before I take this medication? They need to know if you have any of these conditions: Blood disorders Infection Kidney disease Liver disease Lung or breathing disease, such as asthma or COPD Recent or ongoing radiation therapy An unusual or allergic reaction to gemcitabine , other medications, foods, dyes, or preservatives If you or your partner are pregnant or trying to get pregnant Breast-feeding How should I use this medication? This medication is injected into a vein. It is given by your care team in a hospital or clinic setting. Talk to your care team about the use of this medication in children. Special care may be needed. Overdosage: If you think you have taken too much of this medicine contact a poison control center or emergency room at once. NOTE: This medicine is only for you. Do not share this medicine with others. What if I miss a dose? Keep appointments for follow-up doses. It is important not to miss your dose.  Call your care team if you are unable to keep an appointment. What may interact with this medication? Interactions have not been studied. This list may not describe all possible interactions. Give your health care provider a list of all the medicines, herbs, non-prescription drugs, or dietary supplements you use. Also tell them if you smoke, drink alcohol , or use illegal drugs. Some items may interact with your medicine. What should I watch for while using this medication? Your condition will be monitored carefully while you are receiving this medication. This medication may make you feel generally unwell. This is not uncommon, as chemotherapy can affect healthy cells as well as cancer cells. Report any side effects. Continue your course of treatment even though you feel ill unless your care team tells you to stop. In some cases, you may be given additional medications to help with side effects. Follow all directions for their use. This medication may increase your risk of getting an infection. Call your care team for advice if you get a fever, chills, sore throat, or other symptoms of a cold or flu. Do not treat yourself. Try to avoid being around people who  are sick. This medication may increase your risk to bruise or bleed. Call your care team if you notice any unusual bleeding. Be careful brushing or flossing your teeth or using a toothpick because you may get an infection or bleed more easily. If you have any dental work done, tell your dentist you are receiving this medication. Avoid taking medications that contain aspirin, acetaminophen , ibuprofen, naproxen, or ketoprofen unless instructed by your care team. These medications may hide a fever. Talk to your care team if you or your partner wish to become pregnant or think you might be pregnant. This medication can cause serious birth defects if taken during pregnancy and for 6 months after the last dose. A negative pregnancy test is required before  starting this medication. A reliable form of contraception is recommended while taking this medication and for 6 months after the last dose. Talk to your care team about effective forms of contraception. Do not father a child while taking this medication and for 3 months after the last dose. Use a condom while having sex during this time period. Do not breastfeed while taking this medication and for at least 1 week after the last dose. This medication may cause infertility. Talk to your care team if you are concerned about your fertility. What side effects may I notice from receiving this medication? Side effects that you should report to your care team as soon as possible: Allergic reactions--skin rash, itching, hives, swelling of the face, lips, tongue, or throat Capillary leak syndrome--stomach or muscle pain, unusual weakness or fatigue, feeling faint or lightheaded, decrease in the amount of urine, swelling of the ankles, hands, or feet, trouble breathing Infection--fever, chills, cough, sore throat, wounds that don't heal, pain or trouble when passing urine, general feeling of discomfort or being unwell Liver injury--right upper belly pain, loss of appetite, nausea, light-colored stool, dark yellow or brown urine, yellowing skin or eyes, unusual weakness or fatigue Low red blood cell level--unusual weakness or fatigue, dizziness, headache, trouble breathing Lung injury--shortness of breath or trouble breathing, cough, spitting up blood, chest pain, fever Stomach pain, bloody diarrhea, pale skin, unusual weakness or fatigue, decrease in the amount of urine, which may be signs of hemolytic uremic syndrome Sudden and severe headache, confusion, change in vision, seizures, which may be signs of posterior reversible encephalopathy syndrome (PRES) Unusual bruising or bleeding Side effects that usually do not require medical attention (report to your care team if they continue or are  bothersome): Diarrhea Drowsiness Hair loss Nausea Pain, redness, or swelling with sores inside the mouth or throat Vomiting This list may not describe all possible side effects. Call your doctor for medical advice about side effects. You may report side effects to FDA at 1-800-FDA-1088. Where should I keep my medication? This medication is given in a hospital or clinic. It will not be stored at home. NOTE: This sheet is a summary. It may not cover all possible information. If you have questions about this medicine, talk to your doctor, pharmacist, or health care provider.  2024 Elsevier/Gold Standard (2022-03-07 00:00:00)

## 2024-03-06 NOTE — Progress Notes (Signed)
 [facility] [phone]   [phone]  OFFICE PROGRESS NOTE  [name], Kristina Pfeiffer, MD [address] [place] [address]  DIAGNOSIS:  1) Stage IIIb (T3, N2, M0) non-small cell lung cancer, squamous cell carcinoma presented with 2 separate right upper lobe lung nodule in addition to mediastinal lymphadenopathy diagnosed in January 2024. 2) left thyroid  nodule.  PDL1 expression: 1%   PRIOR THERAPY:  1) A course of concurrent chemoradiation with weekly carboplatin  for AUC of 2 and paclitaxel  45 Mg/M2. Last dose on 02/05/23. Status post 6 cycles. Taxol  changed to abraxane  starting from cycle #4 due to reaction to taxol   2) Consolidation immunotherapy with Imfinzi  1500 mg IV every 4 weeks.  First dose expected on 03/12/23.  Status post 7 cycles. 3) CT-guided pulsed field electroporation of the right suprahilar mass under the care of Dr. Nowell Begun at [facility] on 02/12/2024.   CURRENT THERAPY: Systemic chemotherapy with Carboplatin  for AUC of 5 on day 1, gemcitabine  1000 Mg/M2 on days 1 and 8 as well as Libtayo  (Cempilimab) 350 Mg IV on day 1 every 3 weeks.  First dose March 06, 2024.  INTERVAL HISTORY: Alan Mendez 83 y.o. male is to the clinic today for follow-up visit accompanied by his son Alan Mendez.Discussed the use of AI scribe software for clinical note transcription with the patient, who gave verbal consent to proceed.  History of Present Illness   Alan Mendez is an 83 year old male with recurrent non-small cell lung cancer who presents for follow-up after recent procedures and treatment.  He has a history of recurrent non-small cell lung cancer, initially diagnosed as stage 3B squamous cell carcinoma in January 2024 with PD-L1 expression of 1%. He underwent concurrent chemoradiation with weekly carboplatin  and paclitaxel , followed by Abraxane  and immunotherapy with Imfinzi  every 4 weeks for 7 weeks. He is currently undergoing a treatment  regimen that includes chemotherapy and immunotherapy, with cycles every three weeks. He has Compazine  available for nausea, which he has not needed to use yet. He recalls needing medication for throat discomfort during previous radiation treatments but has not required it this time. He is concerned about potential side effects like constipation and hair loss, noting that hair loss with the current regimen is around 20%.  Recently, he underwent a procedure with PEF at [facility], which involved accessing the area through his back due to difficulty in [place]. The procedure was performed by an associate of Dr. Nowell Begun and was reported to have gone well. Post-procedure, he felt rundown for a couple of weeks, which he speculates might be psychological. He did not experience pain in the area of the procedure. He felt tired after the recent procedure, which he attributes to anesthesia and sedation.  In the past few months, he has noticed easy bruising and bleeding when exposed to the sun. He denies being on blood thinners or aspirin. His recent blood tests showed normal platelet count and hemoglobin levels, with a platelet count of 255,000 and hemoglobin of 13.1. The bruising may be related to age or sun exposure.  He reports a decrease in lung capacity due to scarring from previous treatments but continues to engage in physical activities like golf and working out. He is otherwise active, engaging in activities such as golf and exercise, which may help improve stamina.        MEDICAL HISTORY: Past Medical History:  Diagnosis Date   Acquired solitary kidney    left --  s/p  right nephroureterctomy 01/ 2018   Anxiety    no current problems   Depression    no current problems   Elevated PSA    Enlarged lymph node 11/2022   enlarged right paratracheal lymph node.   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, negative margins)   History of kidney stones    passed stones and also surgery to remove   History of unilateral nephrectomy    01/ 2018  right nephroureterectomy for renal pelvis mass (carcinoma in situ)   HLD (hyperlipidemia)    on crestor    Hyperplasia of prostate without lower urinary tract symptoms (LUTS)    Lung cancer (HCC) 12/07/2022   Nephrolithiasis    bilateral non-obstructive   Pneumonia    x 1 - yrs ago   Pulmonary nodules 11/2022   2 right upper lobe pulmonary nodules   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HCC)    Renal cyst, acquired, right    right kidney removed   Wears glasses     ALLERGIES:  is allergic to paclitaxel , lamisil [terbinafine], and ivp dye [iodinated contrast media].  MEDICATIONS:  Current Outpatient Medications  Medication Sig Dispense Refill   cholecalciferol  (VITAMIN D3) 25 MCG (1000 UNIT) tablet Take 1,000 Units by mouth daily.     diphenhydrAMINE  (BENADRYL ) 50 MG tablet Take 1 tablet 2 hours before the CT scan. 1 tablet 0   lidocaine -prilocaine  (EMLA ) cream Apply small amount cream to port a cath site 30-60 minutes prior to accessing port 30 g 0   Multiple Vitamins-Minerals (MULTIVITAMIN WITH MINERALS) tablet Take 1 tablet by mouth daily. Centrum     predniSONE  (DELTASONE ) 50 MG tablet 1 tablet p.o. 14 hours then 7 hours then 2 hours before the scan. Please take Benadryl  50 mg p.o. x 1 2 hours before the scan with the last dose of prednisone . 3 tablet 0   prochlorperazine  (COMPAZINE ) 10 MG tablet Take 10 mg by mouth every 6 (six) hours as needed for nausea or vomiting.     rosuvastatin  (CRESTOR ) 10 MG tablet TAKE 1 TABLET EACH DAY. (Patient taking differently: Take 20 mg by mouth daily.) 30 tablet 0   silver  sulfADIAZINE  (SILVADENE ) 1 % cream Apply 1 Application topically daily. 50 g 0   sucralfate  (CARAFATE ) 1 g  tablet Take 1 tablet (1 g total) by mouth 4 (four) times daily -  with meals and at bedtime. Dissolve tablet in water  -swish and swallow. 30 tablet 2   tamsulosin  (FLOMAX ) 0.4 MG CAPS capsule Take 1 capsule (0.4 mg total) by mouth daily. (Patient taking differently: Take 0.4 mg by mouth daily after breakfast.) 30 capsule 5   No current facility-administered medications for this visit.    SURGICAL HISTORY:  Past Surgical History:  Procedure Laterality Date   APPENDECTOMY  child   BRONCHIAL BIOPSY  12/07/2022   Procedure: BRONCHIAL BIOPSIES;  Surgeon: Prudy Brownie, DO;  Location: [facility];  Service: Pulmonary;;   BRONCHIAL BRUSHINGS  12/07/2022   Procedure: BRONCHIAL BRUSHINGS;  Surgeon: Prudy Brownie, DO;  Location: [facility];  Service: Pulmonary;;   BRONCHIAL NEEDLE ASPIRATION BIOPSY  12/07/2022   Procedure: BRONCHIAL NEEDLE ASPIRATION BIOPSIES;  Surgeon: Prudy Brownie, DO;  Location: [facility];  Service: Pulmonary;;  CATARACT EXTRACTION W/ INTRAOCULAR LENS  IMPLANT, BILATERAL  2017   COLONOSCOPY  08/15/2012   Tubular Adenoma, No high grade dysplasia or malignacy.   CYSTOSCOPY W/ RETROGRADES Right 09/25/2016   Procedure: CYSTOSCOPY, URETHRAL DILITATION  WITH RETROGRADE PYELOGRAM, BRUSH BIOPSIES OF RIGHT RENAL MASS;  Surgeon: Annamarie Kid, MD;  Location: [facility];  Service: Urology;  Laterality: Right;   CYSTOSCOPY W/ RETROGRADES Left 03/16/2017   Procedure: CYSTOSCOPY WITH RETROGRADE PYELOGRAM;  Surgeon: Andrez Banker, MD;  Location[facility];  Service: Urology;  Laterality: Left;   CYSTOSCOPY W/ URETERAL STENT REMOVAL Right 09/25/2016   Procedure: CYSTOSCOPY WITH STENT REMOVAL;  Surgeon: Annamarie Kid, MD;  Location: [facility];  Service: Urology;  Laterality: Right;   CYSTOSCOPY WITH RETROGRADE PYELOGRAM, URETEROSCOPY AND STENT PLACEMENT Right 08/21/2016   Procedure: CYSTOSCOPY WITH RIGHT RETROGRADE  PYELOGRAM, RIGHT FLEXIBLE AND RIGID URETEROSCOPY, INSERTION DOUBLE J STENT RIGHT;  Surgeon: Annamarie Kid, MD;  Location: [facility];  Service: Urology;  Laterality: Right;   EXTRACORPOREAL SHOCK WAVE LITHOTRIPSY  2009   IR IMAGING GUIDED PORT INSERTION  07/09/2023   KNEE SURGERY Right 1980's   ROBOT ASSITED LAPAROSCOPIC NEPHROURETERECTOMY Right 11/15/2016   Procedure: RIGHT XI ROBOT ASSITED LAPAROSCOPIC NEPHROURETERECTOMY;  Surgeon: Andrez Banker, MD;  Location: [facility];  Service: Urology;  Laterality: Right;   TRANSURETHRAL RESECTION OF BLADDER TUMOR WITH MITOMYCIN -C Left 03/16/2017   Procedure: CYSTOSCOPY BIOPSIES OF BLADDER TUMOR WITH FULGURATION;  Surgeon: Andrez Banker, MD;  Location: [facility];  Service: Urology;  Laterality: Left;   URETEROSCOPY Right 09/25/2016   Procedure: RIGHT URETEROSCOPY;  Surgeon: Annamarie Kid, MD;  Location: [facility];  Service: Urology;  Laterality: Right;   VIDEO BRONCHOSCOPY WITH ENDOBRONCHIAL ULTRASOUND Right 12/07/2022   Procedure: VIDEO BRONCHOSCOPY WITH ENDOBRONCHIAL ULTRASOUND;  Surgeon: Prudy Brownie, DO;  Location: [facility];  Service: Pulmonary;  Laterality: Right;    REVIEW OF SYSTEMS:  Constitutional: positive for fatigue Eyes: negative Ears, nose, mouth, throat, and face: negative Respiratory: negative Cardiovascular: negative Gastrointestinal: negative Genitourinary:negative Integument/breast: negative Hematologic/lymphatic: positive for easy bruising Musculoskeletal:negative Neurological: negative Behavioral/Psych: positive for anxiety Endocrine: negative Allergic/Immunologic: negative   PHYSICAL EXAMINATION: General appearance: alert, cooperative, and no distress Head: Normocephalic, without obvious abnormality, atraumatic Neck: no adenopathy, no JVD, supple, symmetrical, trachea midline, and thyroid  not enlarged, symmetric, no tenderness/mass/nodules Lymph nodes:  Cervical, supraclavicular, and axillary nodes normal. Resp: clear to auscultation bilaterally Back: symmetric, no curvature. ROM normal. No CVA tenderness. Cardio: regular rate and rhythm, S1, S2 normal, no murmur, click, rub or gallop GI: soft, non-tender; bowel sounds normal; no masses,  no organomegaly Extremities: extremities normal, atraumatic, no cyanosis or edema Neurologic: Alert and oriented X 3, normal strength and tone. Normal symmetric reflexes. Normal coordination and gait  ECOG PERFORMANCE STATUS: 1 - Symptomatic but completely ambulatory  Blood pressure (!) 142/85, pulse 78, temperature 98.1 F (36.7 C), temperature source Temporal, resp. rate 16, height 5\' 5"  (1.651 m), weight 141 lb 1.6 oz (64 kg), SpO2 100%.  LABORATORY DATA: Lab Results  Component Value Date   WBC 6.6 03/06/2024   HGB 13.1 03/06/2024   HCT 38.6 (L) 03/06/2024   MCV 80.4 03/06/2024   PLT 255 03/06/2024      Chemistry      Component Value Date/Time   NA 139 03/06/2024 0958   K 4.1 03/06/2024 0958   CL 107 03/06/2024 0958   CO2 28 03/06/2024 0958  BUN 18 03/06/2024 0958   CREATININE 0.97 03/06/2024 0958   CREATININE 0.85 12/10/2014 1550      Component Value Date/Time   CALCIUM  9.0 03/06/2024 0958   ALKPHOS 96 03/06/2024 0958   AST 18 03/06/2024 0958   ALT 15 03/06/2024 0958   BILITOT 0.6 03/06/2024 0958       RADIOGRAPHIC STUDIES: No results found.  ASSESSMENT AND PLAN: This is a very pleasant 83 years old white male with Stage IIIb (T3, N2, M0) non-small cell lung cancer, squamous cell carcinoma presented with 2 separate right upper lobe lung nodule in addition to mediastinal lymphadenopathy diagnosed in January 2024. The patient has no evidence of metastatic disease to the brain or extrathoracic metastasis besides the 2 right upper lobe pulmonary nodule and mediastinal lymphadenopathy. I discussed his case with Dr. Luna Salinas, cardiothoracic surgery and he indicated that because of  the location of the lymph node, the patient will not be a great candidate for surgical resection with negative margin. The patient underwent a course of concurrent chemoradiation with weekly carboplatin  for AUC of 2 and paclitaxel  45 Mg/M2.  Status post 6 cycles.  His treatment with paclitaxel  was changed to Abraxane  starting from cycle #3 with secondary to hypersensitivity reaction. He tolerated this treatment fairly well with partial response. He underwent consolidation treatment with immunotherapy with Imfinzi  1500 Mg IV every 4 weeks status post 7 cycles.  This treatment was discontinued secondary to disease progression. The patient was seen by thoracic surgery at [facility] for consideration of surgical resection but he was not a surgical candidate. The patient underwent pulsed electric field therapy to the right suprahilar mass at [facility]. He is here today for evaluation before starting the first cycle of systemic chemotherapy with carboplatin  for AUC of 5 on day 1, gemcitabine  1000 Mg/M2 on days 1 and 8 in addition to Libtayo  (Cempilimab) 350 Mg IV on day 1 every 3 weeks.  First dose 03/06/2024.    Recurrent non-small cell lung cancer Recurrent non-small cell lung cancer, initially diagnosed as stage 3B squamous cell carcinoma in January 2024 with PD-L1 expression of 1%. Underwent concurrent chemoradiation with carboplatin  and paclitaxel , followed by Abraxane  and Imfinzi . Recent procedure at [facility] was challenging due to radiation-induced fibrous tissue. No new imaging studies have been conducted to assess tumor growth. He reports fatigue post-procedure, likely due to anesthesia and sedation. The treatment plan includes pulse electric field therapy to improve the affected area and potentially other areas. - Continue chemotherapy and immunotherapy regimen with Carboplatin  for AUC of 5 on day 1, gemcitabine  1000 Mg/M2 on days 1 and 8 as well as Libtayo  (Cempilimab) 350 Mg IV on day 1 every 3  weeks with a 20% chance of hair loss. - Monitor blood counts regularly to assess for anemia and other potential side effects. - Provide Compazine  for nausea as needed. - Advise reporting any constipation for further management. - Schedule CT scan in approximately nine weeks to assess treatment response.  Fatigue Reports feeling rundown for a couple of weeks post-procedure, likely related to anesthesia and sedation. Fatigue may also be related to ongoing cancer treatment and reduced lung capacity due to scarring from previous radiation. - Encourage building stamina through activities like golf and regular exercise. - Monitor for anemia as a potential cause of fatigue during treatment.  Easy bruising Reports easy bruising over the last few months without anticoagulant use. Blood tests show normal platelet count and hemoglobin levels, suggesting no hematological cause for bruising. Bruising  may be related to age or sun exposure. - Advise caution with sun exposure and physical activities that may cause bruising.  Sun-induced rash Reports rash when exposed to the sun. No specific cause identified from blood tests. - Advise caution with sun exposure.   He was advised to call immediately if he has any other concerning symptoms in the interval. The patient voices understanding of current disease status and treatment options and is in agreement with the current care plan.  All questions were answered. The patient knows to call the clinic with any problems, questions or concerns. We can certainly see the patient much sooner if necessary.  The total time spent in the appointment was 40 minutes.  Disclaimer: This note was dictated with voice recognition software. Similar sounding words can inadvertently be transcribed and may not be corrected upon review.

## 2024-03-07 ENCOUNTER — Telehealth: Payer: Self-pay | Admitting: *Deleted

## 2024-03-07 NOTE — Telephone Encounter (Signed)
 Called pt to see how he did with his recent treatment.  He reports doing well except for fatigue.  He is also concerned about constipation.  We discussed fatigue is normal & hopefully better each day & to get some exercise daily.  Discussed increased fluids & fiber & stool softener if needed & Miralax  as an option.  He knows how to reach us  if needed & knows his next appt.  He states that Dr Marguerita Shih said he really didn't need to see him on 5/1 but will clarify with MD/RN.

## 2024-03-12 ENCOUNTER — Encounter: Payer: Self-pay | Admitting: Internal Medicine

## 2024-03-13 ENCOUNTER — Inpatient Hospital Stay: Payer: Medicare Other

## 2024-03-13 ENCOUNTER — Inpatient Hospital Stay: Payer: Medicare Other | Attending: Internal Medicine

## 2024-03-13 ENCOUNTER — Inpatient Hospital Stay: Payer: Medicare Other | Admitting: Internal Medicine

## 2024-03-13 VITALS — BP 127/81 | HR 73 | Temp 97.8°F | Ht 65.0 in | Wt 140.8 lb

## 2024-03-13 DIAGNOSIS — Z5111 Encounter for antineoplastic chemotherapy: Secondary | ICD-10-CM | POA: Insufficient documentation

## 2024-03-13 DIAGNOSIS — C679 Malignant neoplasm of bladder, unspecified: Secondary | ICD-10-CM

## 2024-03-13 DIAGNOSIS — R21 Rash and other nonspecific skin eruption: Secondary | ICD-10-CM | POA: Insufficient documentation

## 2024-03-13 DIAGNOSIS — R590 Localized enlarged lymph nodes: Secondary | ICD-10-CM | POA: Insufficient documentation

## 2024-03-13 DIAGNOSIS — D701 Agranulocytosis secondary to cancer chemotherapy: Secondary | ICD-10-CM | POA: Insufficient documentation

## 2024-03-13 DIAGNOSIS — R911 Solitary pulmonary nodule: Secondary | ICD-10-CM | POA: Diagnosis not present

## 2024-03-13 DIAGNOSIS — C3491 Malignant neoplasm of unspecified part of right bronchus or lung: Secondary | ICD-10-CM

## 2024-03-13 DIAGNOSIS — Z452 Encounter for adjustment and management of vascular access device: Secondary | ICD-10-CM | POA: Insufficient documentation

## 2024-03-13 DIAGNOSIS — C3490 Malignant neoplasm of unspecified part of unspecified bronchus or lung: Secondary | ICD-10-CM

## 2024-03-13 DIAGNOSIS — C3411 Malignant neoplasm of upper lobe, right bronchus or lung: Secondary | ICD-10-CM | POA: Diagnosis present

## 2024-03-13 DIAGNOSIS — Z5112 Encounter for antineoplastic immunotherapy: Secondary | ICD-10-CM | POA: Insufficient documentation

## 2024-03-13 DIAGNOSIS — R059 Cough, unspecified: Secondary | ICD-10-CM | POA: Insufficient documentation

## 2024-03-13 DIAGNOSIS — Z95828 Presence of other vascular implants and grafts: Secondary | ICD-10-CM

## 2024-03-13 LAB — CMP (CANCER CENTER ONLY)
ALT: 38 U/L (ref 0–44)
AST: 27 U/L (ref 15–41)
Albumin: 3.8 g/dL (ref 3.5–5.0)
Alkaline Phosphatase: 97 U/L (ref 38–126)
Anion gap: 3 — ABNORMAL LOW (ref 5–15)
BUN: 20 mg/dL (ref 8–23)
CO2: 28 mmol/L (ref 22–32)
Calcium: 8.8 mg/dL — ABNORMAL LOW (ref 8.9–10.3)
Chloride: 108 mmol/L (ref 98–111)
Creatinine: 0.98 mg/dL (ref 0.61–1.24)
GFR, Estimated: >60 mL/min (ref >60)
Glucose, Bld: 90 mg/dL (ref 70–99)
Potassium: 4.1 mmol/L (ref 3.5–5.1)
Sodium: 139 mmol/L (ref 135–145)
Total Bilirubin: 0.5 mg/dL (ref 0.0–1.2)
Total Protein: 7.1 g/dL (ref 6.5–8.1)

## 2024-03-13 LAB — CBC WITH DIFFERENTIAL (CANCER CENTER ONLY)
Abs Immature Granulocytes: 0.01 10*3/uL (ref 0.00–0.07)
Basophils Absolute: 0 10*3/uL (ref 0.0–0.1)
Basophils Relative: 0 %
Eosinophils Absolute: 0.1 10*3/uL (ref 0.0–0.5)
Eosinophils Relative: 5 %
HCT: 36.8 % — ABNORMAL LOW (ref 39.0–52.0)
Hemoglobin: 12.2 g/dL — ABNORMAL LOW (ref 13.0–17.0)
Immature Granulocytes: 0 %
Lymphocytes Relative: 17 %
Lymphs Abs: 0.5 10*3/uL — ABNORMAL LOW (ref 0.7–4.0)
MCH: 26.9 pg (ref 26.0–34.0)
MCHC: 33.2 g/dL (ref 30.0–36.0)
MCV: 81.2 fL (ref 80.0–100.0)
Monocytes Absolute: 0.1 10*3/uL (ref 0.1–1.0)
Monocytes Relative: 5 %
Neutro Abs: 1.9 10*3/uL (ref 1.7–7.7)
Neutrophils Relative %: 73 %
Platelet Count: 134 10*3/uL — ABNORMAL LOW (ref 150–400)
RBC: 4.53 MIL/uL (ref 4.22–5.81)
RDW: 12.6 % (ref 11.5–15.5)
WBC Count: 2.7 10*3/uL — ABNORMAL LOW (ref 4.0–10.5)
nRBC: 0 % (ref 0.0–0.2)

## 2024-03-13 MED ORDER — SODIUM CHLORIDE 0.9 % IV SOLN
1000.0000 mg/m2 | Freq: Once | INTRAVENOUS | Status: AC
  Administered 2024-03-13: 1710 mg via INTRAVENOUS
  Filled 2024-03-13: qty 44.97

## 2024-03-13 MED ORDER — PROCHLORPERAZINE MALEATE 10 MG PO TABS
10.0000 mg | ORAL_TABLET | Freq: Once | ORAL | Status: AC
  Administered 2024-03-13: 10 mg via ORAL
  Filled 2024-03-13: qty 1

## 2024-03-13 MED ORDER — HEPARIN SOD (PORK) LOCK FLUSH 100 UNIT/ML IV SOLN
500.0000 [IU] | Freq: Once | INTRAVENOUS | Status: AC | PRN
  Administered 2024-03-13: 500 [IU]

## 2024-03-13 MED ORDER — SODIUM CHLORIDE 0.9% FLUSH
10.0000 mL | Freq: Once | INTRAVENOUS | Status: AC
  Administered 2024-03-13: 10 mL

## 2024-03-13 MED ORDER — SODIUM CHLORIDE 0.9 % IV SOLN: INTRAVENOUS | Status: DC

## 2024-03-13 MED ORDER — SODIUM CHLORIDE 0.9% FLUSH
10.0000 mL | INTRAVENOUS | Status: DC | PRN
  Administered 2024-03-13: 10 mL

## 2024-03-13 NOTE — Patient Instructions (Signed)

## 2024-03-13 NOTE — Progress Notes (Signed)
 [facility] [phone]   [phone]  OFFICE PROGRESS NOTE  [name], Kristina Pfeiffer, MD [address] [place] [address]  DIAGNOSIS:  1) Stage IIIb (T3, N2, M0) non-small cell lung cancer, squamous cell carcinoma presented with 2 separate right upper lobe lung nodule in addition to mediastinal lymphadenopathy diagnosed in January 2024. 2) left thyroid  nodule.  PDL1 expression: 1%   PRIOR THERAPY:  1) A course of concurrent chemoradiation with weekly carboplatin  for AUC of 2 and paclitaxel  45 Mg/M2. Last dose on 02/05/23. Status post 6 cycles. Taxol  changed to abraxane  starting from cycle #4 due to reaction to taxol   2) Consolidation immunotherapy with Imfinzi  1500 mg IV every 4 weeks.  First dose expected on 03/12/23.  Status post 7 cycles. 3) CT-guided pulsed field electroporation of the right suprahilar mass under the care of Dr. Nowell Begun at [facility] on 02/12/2024.   CURRENT THERAPY: Systemic chemotherapy with Carboplatin  for AUC of 5 on day 1, gemcitabine  1000 Mg/M2 on days 1 and 8 as well as Libtayo  (Cempilimab) 350 Mg IV on day 1 every 3 weeks.  First dose March 06, 2024.  INTERVAL HISTORY: Alan Mendez 83 y.o. male returns to the clinic today for follow-up visit. Discussed the use of AI scribe software for clinical note transcription with the patient, who gave verbal consent to proceed.  History of Present Illness   Alan Mendez is an 83 year old male with stage III B non-small cell lung cancer who presents for follow-up after starting systemic chemoimmunotherapy.  He was diagnosed with stage III B non-small cell lung cancer, squamous cell carcinoma, in January 2024. Despite initial treatments, he experienced disease progression. In February 2025, he underwent concurrent chemoradiation followed by seven cycles of consolidation treatment with durvalumab , which was discontinued due to further disease progression.  On February 12, 2024, he underwent CT-guided pulsed field electroporation of the right suprahilar mass at [facility]. Currently, he is receiving systemic chemoimmunotherapy with carboplatin , gemcitabine , and Libtayo , and is one week post-treatment.  He experiences significant fatigue following the first week of treatment, stating he 'slept for two days afterward' and felt 'washed out.' By the third day, he felt more like himself. He denies nausea or vomiting but notes a dry cough. No pain, fever, or chills. He is aware of a drop in his white blood count, which he attributes to the chemotherapy regimen.     MEDICAL HISTORY: Past Medical History:  Diagnosis Date   Acquired solitary kidney    left --  s/p  right nephroureterctomy 01/ 2018   Anxiety    no current problems   Depression    no current problems   Elevated PSA    Enlarged lymph node 11/2022   enlarged right paratracheal lymph node.   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, negative margins)   History of kidney stones    passed stones and also surgery to remove   History of unilateral nephrectomy    01/ 2018  right nephroureterectomy for renal pelvis mass (carcinoma in situ)   HLD (hyperlipidemia)    on crestor    Hyperplasia of prostate without lower urinary tract symptoms (LUTS)    Lung cancer (HCC) 12/07/2022  Nephrolithiasis    bilateral non-obstructive   Pneumonia    x 1 - yrs ago   Pulmonary nodules 11/2022   2 right upper lobe pulmonary nodules   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HCC)    Renal cyst, acquired, right    right kidney removed   Wears glasses     ALLERGIES:  is allergic to paclitaxel , lamisil [terbinafine], and ivp dye [iodinated contrast media].  MEDICATIONS:  Current Outpatient  Medications  Medication Sig Dispense Refill   cholecalciferol  (VITAMIN D3) 25 MCG (1000 UNIT) tablet Take 1,000 Units by mouth daily.     diphenhydrAMINE  (BENADRYL ) 50 MG tablet Take 1 tablet 2 hours before the CT scan. 1 tablet 0   lidocaine -prilocaine  (EMLA ) cream Apply small amount cream to port a cath site 30-60 minutes prior to accessing port 30 g 0   Multiple Vitamins-Minerals (MULTIVITAMIN WITH MINERALS) tablet Take 1 tablet by mouth daily. Centrum     predniSONE  (DELTASONE ) 50 MG tablet 1 tablet p.o. 14 hours then 7 hours then 2 hours before the scan. Please take Benadryl  50 mg p.o. x 1 2 hours before the scan with the last dose of prednisone . 3 tablet 0   prochlorperazine  (COMPAZINE ) 10 MG tablet Take 10 mg by mouth every 6 (six) hours as needed for nausea or vomiting.     rosuvastatin  (CRESTOR ) 10 MG tablet TAKE 1 TABLET EACH DAY. (Patient taking differently: Take 20 mg by mouth daily.) 30 tablet 0   silver  sulfADIAZINE  (SILVADENE ) 1 % cream Apply 1 Application topically daily. 50 g 0   sucralfate  (CARAFATE ) 1 g tablet Take 1 tablet (1 g total) by mouth 4 (four) times daily -  with meals and at bedtime. Dissolve tablet in water  -swish and swallow. 30 tablet 2   tamsulosin  (FLOMAX ) 0.4 MG CAPS capsule Take 1 capsule (0.4 mg total) by mouth daily. (Patient taking differently: Take 0.4 mg by mouth daily after breakfast.) 30 capsule 5   No current facility-administered medications for this visit.    SURGICAL HISTORY:  Past Surgical History:  Procedure Laterality Date   APPENDECTOMY  child   BRONCHIAL BIOPSY  12/07/2022   Procedure: BRONCHIAL BIOPSIES;  Surgeon: Prudy Brownie, DO;  Location: [facility];  Service: Pulmonary;;   BRONCHIAL BRUSHINGS  12/07/2022   Procedure: BRONCHIAL BRUSHINGS;  Surgeon: Prudy Brownie, DO;  Location: [facility];  Service: Pulmonary;;   BRONCHIAL NEEDLE ASPIRATION BIOPSY  12/07/2022   Procedure: BRONCHIAL NEEDLE ASPIRATION BIOPSIES;  Surgeon: Prudy Brownie, DO;  Location: [facility];  Service: Pulmonary;;   CATARACT EXTRACTION W/ INTRAOCULAR LENS  IMPLANT, BILATERAL  2017   COLONOSCOPY  08/15/2012   Tubular Adenoma, No high grade dysplasia or malignacy.   CYSTOSCOPY W/ RETROGRADES Right 09/25/2016   Procedure: CYSTOSCOPY, URETHRAL DILITATION  WITH RETROGRADE PYELOGRAM, BRUSH BIOPSIES OF RIGHT RENAL MASS;  Surgeon: Annamarie Kid, MD;  Location: [facility];  Service: Urology;  Laterality: Right;   CYSTOSCOPY W/ RETROGRADES Left 03/16/2017   Procedure: CYSTOSCOPY WITH RETROGRADE PYELOGRAM;  Surgeon: Andrez Banker, MD;  Location: [facility];  Service: Urology;  Laterality: Left;   CYSTOSCOPY W/ URETERAL STENT REMOVAL Right 09/25/2016   Procedure: CYSTOSCOPY WITH STENT REMOVAL;  Surgeon: Annamarie Kid, MD;  Location: [facility];  Service: Urology;  Laterality: Right;   CYSTOSCOPY WITH RETROGRADE PYELOGRAM, URETEROSCOPY AND STENT PLACEMENT Right 08/21/2016   Procedure: CYSTOSCOPY WITH RIGHT RETROGRADE PYELOGRAM, RIGHT FLEXIBLE AND RIGID URETEROSCOPY, INSERTION DOUBLE J STENT  RIGHT;  Surgeon: Annamarie Kid, MD;  Location: [facility];  Service: Urology;  Laterality: Right;   EXTRACORPOREAL SHOCK WAVE LITHOTRIPSY  2009   IR IMAGING GUIDED PORT INSERTION  07/09/2023   KNEE SURGERY Right 1980's   ROBOT ASSITED LAPAROSCOPIC NEPHROURETERECTOMY Right 11/15/2016   Procedure: RIGHT XI ROBOT ASSITED LAPAROSCOPIC NEPHROURETERECTOMY;  Surgeon: Andrez Banker, MD;  Location: [facility];  Service: Urology;  Laterality: Right;   TRANSURETHRAL RESECTION OF BLADDER TUMOR WITH MITOMYCIN -C Left 03/16/2017   Procedure: CYSTOSCOPY BIOPSIES OF BLADDER TUMOR WITH FULGURATION;  Surgeon: Andrez Banker, MD;  Location: [facility];  Service: Urology;  Laterality: Left;   URETEROSCOPY Right 09/25/2016   Procedure: RIGHT URETEROSCOPY;  Surgeon: Annamarie Kid, MD;   Location: [facility];  Service: Urology;  Laterality: Right;   VIDEO BRONCHOSCOPY WITH ENDOBRONCHIAL ULTRASOUND Right 12/07/2022   Procedure: VIDEO BRONCHOSCOPY WITH ENDOBRONCHIAL ULTRASOUND;  Surgeon: Prudy Brownie, DO;  Location: [facility];  Service: Pulmonary;  Laterality: Right;    REVIEW OF SYSTEMS:  A comprehensive review of systems was negative except for: Constitutional: positive for fatigue Respiratory: positive for cough   PHYSICAL EXAMINATION: General appearance: alert, cooperative, and no distress Head: Normocephalic, without obvious abnormality, atraumatic Neck: no adenopathy, no JVD, supple, symmetrical, trachea midline, and thyroid  not enlarged, symmetric, no tenderness/mass/nodules Lymph nodes: Cervical, supraclavicular, and axillary nodes normal. Resp: clear to auscultation bilaterally Back: symmetric, no curvature. ROM normal. No CVA tenderness. Cardio: regular rate and rhythm, S1, S2 normal, no murmur, click, rub or gallop GI: soft, non-tender; bowel sounds normal; no masses,  no organomegaly Extremities: extremities normal, atraumatic, no cyanosis or edema  ECOG PERFORMANCE STATUS: 1 - Symptomatic but completely ambulatory  Blood pressure 127/81, pulse 73, temperature 97.8 F (36.6 C), temperature source Temporal, height 5\' 5"  (1.651 m), weight 140 lb 12.8 oz (63.9 kg), SpO2 98%.  LABORATORY DATA: Lab Results  Component Value Date   WBC 2.7 (L) 03/13/2024   HGB 12.2 (L) 03/13/2024   HCT 36.8 (L) 03/13/2024   MCV 81.2 03/13/2024   PLT 134 (L) 03/13/2024      Chemistry      Component Value Date/Time   NA 139 03/06/2024 0958   K 4.1 03/06/2024 0958   CL 107 03/06/2024 0958   CO2 28 03/06/2024 0958   BUN 18 03/06/2024 0958   CREATININE 0.97 03/06/2024 0958   CREATININE 0.85 12/10/2014 1550      Component Value Date/Time   CALCIUM  9.0 03/06/2024 0958   ALKPHOS 96 03/06/2024 0958   AST 18 03/06/2024 0958   ALT 15 03/06/2024 0958    BILITOT 0.6 03/06/2024 0958       RADIOGRAPHIC STUDIES: No results found.  ASSESSMENT AND PLAN: This is a very pleasant 83 years old white male with Stage IIIb (T3, N2, M0) non-small cell lung cancer, squamous cell carcinoma presented with 2 separate right upper lobe lung nodule in addition to mediastinal lymphadenopathy diagnosed in January 2024. The patient has no evidence of metastatic disease to the brain or extrathoracic metastasis besides the 2 right upper lobe pulmonary nodule and mediastinal lymphadenopathy. I discussed his case with Dr. Luna Salinas, cardiothoracic surgery and he indicated that because of the location of the lymph node, the patient will not be a great candidate for surgical resection with negative margin. The patient underwent a course of concurrent chemoradiation with weekly carboplatin  for AUC of 2 and paclitaxel  45 Mg/M2.  Status post 6 cycles.  His treatment  with paclitaxel  was changed to Abraxane  starting from cycle #3 with secondary to hypersensitivity reaction. He tolerated this treatment fairly well with partial response. He underwent consolidation treatment with immunotherapy with Imfinzi  1500 Mg IV every 4 weeks status post 7 cycles.  This treatment was discontinued secondary to disease progression. The patient was seen by thoracic surgery at [facility] for consideration of surgical resection but he was not a surgical candidate. The patient underwent pulsed electric field therapy to the right suprahilar mass at [facility]. He is here today for evaluation before starting the first cycle of systemic chemotherapy with carboplatin  for AUC of 5 on day 1, gemcitabine  1000 Mg/M2 on days 1 and 8 in addition to Libtayo  (Cempilimab) 350 Mg IV on day 1 every 3 weeks.  First dose 03/06/2024. He tolerated the first week of his treatment well except for fatigue that lasted for 2 days.  He also has a dry cough today.    Stage III B squamous cell carcinoma of  lung Stage III B squamous cell carcinoma of the lung with disease progression. Undergoing systemic chemoimmunotherapy with carboplatin , gemcitabine , and Libtayo . Reports significant fatigue post-treatment, no nausea or vomiting. Developed a dry cough, likely unrelated to treatment. Leukopenia noted, likely due to chemotherapy. - Continue systemic chemoimmunotherapy with carboplatin , gemcitabine , and Libtayo . - Monitor for signs of infection, including fever and chills, and advise to call immediately if these occur. - Advise to monitor cough and report if it becomes productive with yellowish or greenish sputum.  Leukopenia due to chemotherapy Leukopenia secondary to chemotherapy with carboplatin , gemcitabine , and Libtayo . Emphasized the importance of monitoring for signs of infection due to the risk of further decrease in white blood count with ongoing treatment. - Repeat blood work next week to monitor white blood count. - Advise to come in for lab work only, no visit required.   The patient was advised to call immediately if he has any concerning symptoms in the interval. The patient voices understanding of current disease status and treatment options and is in agreement with the current care plan.  All questions were answered. The patient knows to call the clinic with any problems, questions or concerns. We can certainly see the patient much sooner if necessary.  The total time spent in the appointment was 20 minutes.  Disclaimer: This note was dictated with voice recognition software. Similar sounding words can inadvertently be transcribed and may not be corrected upon review.

## 2024-03-14 ENCOUNTER — Encounter: Payer: Self-pay | Admitting: Internal Medicine

## 2024-03-15 ENCOUNTER — Other Ambulatory Visit: Payer: Self-pay

## 2024-03-16 ENCOUNTER — Other Ambulatory Visit: Payer: Self-pay

## 2024-03-16 ENCOUNTER — Emergency Department (HOSPITAL_BASED_OUTPATIENT_CLINIC_OR_DEPARTMENT_OTHER)
Admission: EM | Admit: 2024-03-16 | Discharge: 2024-03-16 | Disposition: A | Discharge status: Home / Self Care | Attending: Emergency Medicine | Admitting: Emergency Medicine

## 2024-03-16 DIAGNOSIS — Z79899 Other long term (current) drug therapy: Secondary | ICD-10-CM | POA: Insufficient documentation

## 2024-03-16 DIAGNOSIS — R21 Rash and other nonspecific skin eruption: Secondary | ICD-10-CM | POA: Insufficient documentation

## 2024-03-16 DIAGNOSIS — C3491 Malignant neoplasm of unspecified part of right bronchus or lung: Secondary | ICD-10-CM | POA: Diagnosis not present

## 2024-03-16 DIAGNOSIS — Z87891 Personal history of nicotine dependence: Secondary | ICD-10-CM | POA: Diagnosis not present

## 2024-03-16 LAB — COMPREHENSIVE METABOLIC PANEL WITH GFR
ALT: 78 U/L — ABNORMAL HIGH (ref 0–44)
AST: 66 U/L — ABNORMAL HIGH (ref 15–41)
Albumin: 4 g/dL (ref 3.5–5.0)
Alkaline Phosphatase: 114 U/L (ref 38–126)
Anion gap: 12 (ref 5–15)
BUN: 25 mg/dL — ABNORMAL HIGH (ref 8–23)
CO2: 24 mmol/L (ref 22–32)
Calcium: 9.7 mg/dL (ref 8.9–10.3)
Chloride: 102 mmol/L (ref 98–111)
Creatinine, Ser: 1.15 mg/dL (ref 0.61–1.24)
GFR, Estimated: >60 mL/min (ref >60)
Glucose, Bld: 105 mg/dL — ABNORMAL HIGH (ref 70–99)
Potassium: 4 mmol/L (ref 3.5–5.1)
Sodium: 139 mmol/L (ref 135–145)
Total Bilirubin: 0.9 mg/dL (ref 0.0–1.2)
Total Protein: 7 g/dL (ref 6.5–8.1)

## 2024-03-16 LAB — CBC WITH DIFFERENTIAL/PLATELET
Abs Immature Granulocytes: 0.01 10*3/uL (ref 0.00–0.07)
Basophils Absolute: 0 10*3/uL (ref 0.0–0.1)
Basophils Relative: 0 %
Eosinophils Absolute: 0.1 10*3/uL (ref 0.0–0.5)
Eosinophils Relative: 2 %
HCT: 36.3 % — ABNORMAL LOW (ref 39.0–52.0)
Hemoglobin: 12.4 g/dL — ABNORMAL LOW (ref 13.0–17.0)
Immature Granulocytes: 0 %
Lymphocytes Relative: 7 %
Lymphs Abs: 0.3 10*3/uL — ABNORMAL LOW (ref 0.7–4.0)
MCH: 27.9 pg (ref 26.0–34.0)
MCHC: 34.2 g/dL (ref 30.0–36.0)
MCV: 81.8 fL (ref 80.0–100.0)
Monocytes Absolute: 0 10*3/uL — ABNORMAL LOW (ref 0.1–1.0)
Monocytes Relative: 1 %
Neutro Abs: 3.8 10*3/uL (ref 1.7–7.7)
Neutrophils Relative %: 90 %
Platelets: 89 10*3/uL — ABNORMAL LOW (ref 150–400)
RBC: 4.44 MIL/uL (ref 4.22–5.81)
RDW: 12.5 % (ref 11.5–15.5)
WBC: 4.2 10*3/uL (ref 4.0–10.5)
nRBC: 0 % (ref 0.0–0.2)

## 2024-03-16 NOTE — Discharge Instructions (Addendum)
 Blood work looks good here today.  Will contact cancer center to let them know about the rash.  A lot of times they are concerned when you are on immunotherapy with any kind of steroid treatments or even topical steroids.  Certainly safe to take Zyrtec or over-the-counter Benadryl  for the itching.  Return for any new or worse symptoms.  Does not seem to be a distinct allergic reaction

## 2024-03-16 NOTE — ED Triage Notes (Signed)
 Is undergoing chemo currently.

## 2024-03-16 NOTE — ED Provider Notes (Addendum)
 [place] EM[facility]GENCY DEPARTMENT AT [facility] Provider Note   CSN: [id number] Arrival date & time: 03/16/24  1141     History  No chief complaint on file.   Alan Mendez is a 83 y.o. male.  Patient followed by Dr. Liam Redhead at the cancer center.  Patient currently receiving treatment for primary squamous cell carcinoma of right lung.  Patient is receiving chemotherapy as well as immunotherapy.  Saw Dr. Liam Redhead in the office on Thursday.  He did show him the rash.  He felt that it was unrelated to treatments.  Patient's blood counts apparently have been off lately.  But patient states that the rashes gotten a little bit worse apparently in the upper inner arms and inner thigh area.  Says if he does not scratch it it will go away.  Patient without any significant respiratory difficulties.  No fevers no chills not feeling bad otherwise.  The rash is not painful maybe some slight itching to it.  Past medical history significant hyperlipidemia history of kidney stones history of kidney cancer.  Back in 2017 status post right nephrectomy.  It was a low-grade papillary urothelial carcinoma in situ involving the renal pelvis negative margins.  And a new diagnosis of lung cancer in January 2024.  Past surgical history significant for the nephrectomy in 2018 patient is a former smoker quit in 1987.       Home Medications Prior to Admission medications   Medication Sig Start Date End Date Taking? Authorizing Provider  cholecalciferol  (VITAMIN D3) 25 MCG (1000 UNIT) tablet Take 1,000 Units by mouth daily.    [provider]  diphenhydrAMINE  (BENADRYL ) 50 MG tablet Take 1 tablet 2 hours before the CT scan. 07/30/23   Marlene Simas, MD  lidocaine -prilocaine  (EMLA ) cream Apply small amount cream to port a cath site 30-60 minutes prior to accessing port 07/02/23   Heilingoetter, Cassandra L, PA-C  Multiple Vitamins-Minerals (MULTIVITAMIN WITH MINERALS) tablet Take 1 tablet by mouth  daily. Centrum    [provider]  predniSONE  (DELTASONE ) 50 MG tablet 1 tablet p.o. 14 hours then 7 hours then 2 hours before the scan. Please take Benadryl  50 mg p.o. x 1 2 hours before the scan with the last dose of prednisone . 07/30/23   Marlene Simas, MD  prochlorperazine  (COMPAZINE ) 10 MG tablet Take 10 mg by mouth every 6 (six) hours as needed for nausea or vomiting.    Marlene Simas, MD  rosuvastatin  (CRESTOR ) 10 MG tablet TAKE 1 TABLET EACH DAY. Patient taking differently: Take 20 mg by mouth daily. 12/31/15   Adolph Hoop, PA-C  silver  sulfADIAZINE  (SILVADENE ) 1 % cream Apply 1 Application topically daily. 04/01/23   Harris, Abigail, PA-C  sucralfate  (CARAFATE ) 1 g tablet Take 1 tablet (1 g total) by mouth 4 (four) times daily -  with meals and at bedtime. Dissolve tablet in water  -swish and swallow. 09/24/23   Heilingoetter, Cassandra L, PA-C  tamsulosin  (FLOMAX ) 0.4 MG CAPS capsule Take 1 capsule (0.4 mg total) by mouth daily. Patient taking differently: Take 0.4 mg by mouth daily after breakfast. 09/25/16   Annamarie Kid, MD      Allergies    Lamisil [terbinafine] and Ivp dye [iodinated contrast media]    Review of Systems   Review of Systems  Constitutional:  Negative for chills and fever.  HENT:  Negative for ear pain and sore throat.   Eyes:  Negative for pain and visual disturbance.  Respiratory:  Negative for cough and shortness  of breath.   Cardiovascular:  Negative for chest pain and palpitations.  Gastrointestinal:  Negative for abdominal pain and vomiting.  Genitourinary:  Negative for dysuria and hematuria.  Musculoskeletal:  Negative for arthralgias and back pain.  Skin:  Positive for rash. Negative for color change.  Neurological:  Negative for seizures and syncope.  All other systems reviewed and are negative.   Physical Exam Updated Vital Signs BP 127/62 (BP Location: Right Arm)   Pulse 98   Temp 97.8 F (36.6 C)   Resp 18   Ht 1.651 m  (5\' 5" )   Wt 63.5 kg   SpO2 96%   BMI 23.30 kg/m  Physical Exam Vitals and nursing note reviewed.  Constitutional:      General: He is not in acute distress.    Appearance: Normal appearance. He is well-developed.  HENT:     Head: Normocephalic and atraumatic.  Eyes:     Extraocular Movements: Extraocular movements intact.     Conjunctiva/sclera: Conjunctivae normal.     Pupils: Pupils are equal, round, and reactive to light.  Cardiovascular:     Rate and Rhythm: Normal rate and regular rhythm.     Heart sounds: No murmur heard. Pulmonary:     Effort: Pulmonary effort is normal. No respiratory distress.     Breath sounds: Normal breath sounds. No wheezing, rhonchi or rales.  Abdominal:     Palpations: Abdomen is soft.     Tenderness: There is no abdominal tenderness.  Musculoskeletal:        General: No swelling.     Cervical back: Normal range of motion and neck supple.     Comments: Rash to right forearm little bit of skin scaling no vesicles.  Does blanch.  There is some areas with his been a little bit of small hemorrhage into the skin I think that is all normal.  Not related this rash.  Rash to inner thighs kind of macular papular does blanch.  Seen for rash to inner part of the left upper extremity.  Skin:    General: Skin is warm and dry.     Capillary Refill: Capillary refill takes less than 2 seconds.  Neurological:     General: No focal deficit present.     Mental Status: He is alert and oriented to person, place, and time.  Psychiatric:        Mood and Affect: Mood normal.     ED Results / Procedures / Treatments   Labs (all labs ordered are listed, but only abnormal results are displayed) Labs Reviewed  COMPREHENSIVE METABOLIC PANEL WITH GFR  CBC WITH DIFFERENTIAL/PLATELET    EKG None  Radiology No results found.  Procedures Procedures    Medications Ordered in ED Medications - No data to display  ED Course/ Medical Decision Making/ A&P                                  Medical Decision Making Amount and/or Complexity of Data Reviewed Labs: ordered.   Rash blanches.  There is a little bit of some skin hemorrhage but think it is more just from being elderly and some bruising to the skin.  Do not think that the worsening rash is related to that.  No skin peeling.  No vesicles.  Will check CBC with differential and complete metabolic panel.  Patient nontoxic no acute distress.  But since he is on immunotherapy  feel that he needs to recontact cancer center for Dr. Liam Redhead or one of his physician assistants to take a look at this because since it is worsening could be related to the immunotherapy.  Patient's complete metabolic panel AST 66 ALT 78 alk phos normal total bili normal electrolytes are normal.  Blood sugar 105 anion gap 12 CO2 24.  CBC white count 4.2 platelets down a little bit at 89 but not in danger zone.  Will need to have that followed closely.  Patient's differential absolute lymphocytes are down a little bit at 0.1.  Absolute neutrophils is normal at 3.8.  Because of patient receiving immunotherapy usually they do not want corticosteroids.  Even to be applied topically.  Have patient contact cancer center tomorrow to see what they advise.  Patient nontoxic no acute distress.  Does not seem to be any kind of significant allergic reaction.  But certainly patient could take Benadryl  or Zyrtec over-the-counter.   Final Clinical Impression(s) / ED Diagnoses Final diagnoses:  Rash  Squamous cell carcinoma of right lung [facility])    Rx / DC Orders ED Discharge Orders     None         Nicklas Barns, MD 03/16/24 1414    Nicklas Barns, MD 03/16/24 1446    Nicklas Barns, MD 03/16/24 1447

## 2024-03-16 NOTE — ED Triage Notes (Signed)
 Had full blood work on Thursday.

## 2024-03-16 NOTE — ED Notes (Signed)
 Discharge paperwork given and verbally understood.

## 2024-03-16 NOTE — ED Triage Notes (Signed)
 3 weeks ago started bruising easily He states he has a rash ( I can't really see). When he scratches it,it bruises on arms and upper legs.   Chemo for lung cancer,

## 2024-03-17 ENCOUNTER — Encounter: Payer: Self-pay | Admitting: Internal Medicine

## 2024-03-17 ENCOUNTER — Telehealth: Payer: Self-pay | Admitting: Medical Oncology

## 2024-03-17 NOTE — Telephone Encounter (Signed)
 Alan Mendez is spreading to armpits, groin-seen in ED yesterday . Labs done.No other symptoms.  "I can live with it". Benadryl  helped.   LFTs increased-66,77.  WBC improved , plts dropped 89 k.  Lab appt wed.  Asking to see provider also re rash.

## 2024-03-18 ENCOUNTER — Encounter: Payer: Self-pay | Admitting: Medical Oncology

## 2024-03-18 ENCOUNTER — Other Ambulatory Visit: Payer: Self-pay | Admitting: Medical Genetics

## 2024-03-18 ENCOUNTER — Other Ambulatory Visit: Payer: Self-pay | Admitting: Physician Assistant

## 2024-03-18 DIAGNOSIS — C3490 Malignant neoplasm of unspecified part of unspecified bronchus or lung: Secondary | ICD-10-CM

## 2024-03-19 ENCOUNTER — Other Ambulatory Visit

## 2024-03-19 ENCOUNTER — Inpatient Hospital Stay

## 2024-03-19 ENCOUNTER — Inpatient Hospital Stay (HOSPITAL_BASED_OUTPATIENT_CLINIC_OR_DEPARTMENT_OTHER): Admitting: Physician Assistant

## 2024-03-19 VITALS — BP 137/68 | HR 75 | Temp 97.7°F | Resp 16 | Ht 65.0 in | Wt 141.6 lb

## 2024-03-19 DIAGNOSIS — R590 Localized enlarged lymph nodes: Secondary | ICD-10-CM | POA: Diagnosis not present

## 2024-03-19 DIAGNOSIS — L270 Generalized skin eruption due to drugs and medicaments taken internally: Secondary | ICD-10-CM

## 2024-03-19 DIAGNOSIS — Z95828 Presence of other vascular implants and grafts: Secondary | ICD-10-CM

## 2024-03-19 DIAGNOSIS — D759 Disease of blood and blood-forming organs, unspecified: Secondary | ICD-10-CM

## 2024-03-19 DIAGNOSIS — C3411 Malignant neoplasm of upper lobe, right bronchus or lung: Secondary | ICD-10-CM | POA: Diagnosis not present

## 2024-03-19 DIAGNOSIS — C3490 Malignant neoplasm of unspecified part of unspecified bronchus or lung: Secondary | ICD-10-CM

## 2024-03-19 DIAGNOSIS — Z5112 Encounter for antineoplastic immunotherapy: Secondary | ICD-10-CM | POA: Diagnosis not present

## 2024-03-19 DIAGNOSIS — C679 Malignant neoplasm of bladder, unspecified: Secondary | ICD-10-CM

## 2024-03-19 DIAGNOSIS — D701 Agranulocytosis secondary to cancer chemotherapy: Secondary | ICD-10-CM | POA: Diagnosis not present

## 2024-03-19 DIAGNOSIS — C3491 Malignant neoplasm of unspecified part of right bronchus or lung: Secondary | ICD-10-CM

## 2024-03-19 DIAGNOSIS — R911 Solitary pulmonary nodule: Secondary | ICD-10-CM | POA: Diagnosis not present

## 2024-03-19 DIAGNOSIS — Z5111 Encounter for antineoplastic chemotherapy: Secondary | ICD-10-CM | POA: Diagnosis not present

## 2024-03-19 LAB — CBC WITH DIFFERENTIAL (CANCER CENTER ONLY)
Abs Immature Granulocytes: 0.01 10*3/uL (ref 0.00–0.07)
Basophils Absolute: 0 10*3/uL (ref 0.0–0.1)
Basophils Relative: 0 %
Eosinophils Absolute: 0 10*3/uL (ref 0.0–0.5)
Eosinophils Relative: 1 %
HCT: 33.4 % — ABNORMAL LOW (ref 39.0–52.0)
Hemoglobin: 11.2 g/dL — ABNORMAL LOW (ref 13.0–17.0)
Immature Granulocytes: 1 %
Lymphocytes Relative: 19 %
Lymphs Abs: 0.3 10*3/uL — ABNORMAL LOW (ref 0.7–4.0)
MCH: 26.9 pg (ref 26.0–34.0)
MCHC: 33.5 g/dL (ref 30.0–36.0)
MCV: 80.3 fL (ref 80.0–100.0)
Monocytes Absolute: 0 10*3/uL — ABNORMAL LOW (ref 0.1–1.0)
Monocytes Relative: 2 %
Neutro Abs: 1.2 10*3/uL — ABNORMAL LOW (ref 1.7–7.7)
Neutrophils Relative %: 77 %
Platelet Count: 57 10*3/uL — ABNORMAL LOW (ref 150–400)
RBC: 4.16 MIL/uL — ABNORMAL LOW (ref 4.22–5.81)
RDW: 12.3 % (ref 11.5–15.5)
WBC Count: 1.5 10*3/uL — ABNORMAL LOW (ref 4.0–10.5)
nRBC: 0 % (ref 0.0–0.2)

## 2024-03-19 LAB — CMP (CANCER CENTER ONLY)
ALT: 46 U/L — ABNORMAL HIGH (ref 0–44)
AST: 33 U/L (ref 15–41)
Albumin: 3.7 g/dL (ref 3.5–5.0)
Alkaline Phosphatase: 85 U/L (ref 38–126)
Anion gap: 7 (ref 5–15)
BUN: 25 mg/dL — ABNORMAL HIGH (ref 8–23)
CO2: 27 mmol/L (ref 22–32)
Calcium: 8.7 mg/dL — ABNORMAL LOW (ref 8.9–10.3)
Chloride: 105 mmol/L (ref 98–111)
Creatinine: 1.01 mg/dL (ref 0.61–1.24)
GFR, Estimated: >60 mL/min (ref >60)
Glucose, Bld: 96 mg/dL (ref 70–99)
Potassium: 3.9 mmol/L (ref 3.5–5.1)
Sodium: 139 mmol/L (ref 135–145)
Total Bilirubin: 0.6 mg/dL (ref 0.0–1.2)
Total Protein: 6.7 g/dL (ref 6.5–8.1)

## 2024-03-19 MED ORDER — METHYLPREDNISOLONE 4 MG PO TBPK: ORAL_TABLET | ORAL | 0 refills | Status: DC

## 2024-03-19 MED ORDER — SODIUM CHLORIDE 0.9% FLUSH
10.0000 mL | Freq: Once | INTRAVENOUS | Status: AC
  Administered 2024-03-19: 10 mL

## 2024-03-19 MED ORDER — HEPARIN SOD (PORK) LOCK FLUSH 100 UNIT/ML IV SOLN
500.0000 [IU] | Freq: Once | INTRAVENOUS | Status: AC
  Administered 2024-03-19: 500 [IU]

## 2024-03-19 NOTE — Progress Notes (Signed)
 [facility] [phone]   [phone]  PROGRESS NOTE  Patient Care Team: [name], Kristina Pfeiffer, MD as PCP - General (Internal Medicine) Claiborne Crew, MD as Consulting Physician (Orthopedic Surgery) Marlene Simas, MD as Consulting Physician (Oncology) Johna Myers, MD as Consulting Physician (Radiation Oncology)  CHIEF COMPLAINTS/PURPOSE OF CONSULTATION:  Symptom Management Clinic: Rash  HISTORY OF PRESENTING ILLNESS:  Alan Mendez 83 y.o. male is a patient under the care of Dr. Liam Redhead for non-small cell lung cancer, squamous cell carcinoma currently on systemic treatment with carboplatin , gemcitabine  and Libtayo .  On review of the previous records, Alan Mendez presented to the emergency room on 03/16/2024 due to progressive rash involving his upper arms and inner thighs.  Patient was discharged with recommendations to take Benadryl  or Zyrtec over-the-counter.  On exam today, Alan Mendez reports the rash involving his arms are improving but worsening in the inner thigh. The rash was more noticeable after his last chemotherapy infusion. He reports the rash is itchy without any discharge or vesicles. He is otherwise feeling okay without any new or concerning symptoms. He denies fevers, chills, sweats, shortness of breath, chest pain, cough, headaches, dizziness, nausea, vomiting or diarrhea. Rest of the 10 point ROS is below.   MEDICAL HISTORY:  Past Medical History:  Diagnosis Date   Acquired solitary kidney    left --  s/p  right nephroureterctomy 01/ 2018   Anxiety    no current problems   Depression    no current problems   Elevated PSA    Enlarged lymph node 11/2022   enlarged right paratracheal lymph node.   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  involving renal pelvis, negative margins)   History of kidney stones    passed stones and also surgery to remove   History of unilateral nephrectomy    01/ 2018  right nephroureterectomy for renal pelvis mass (carcinoma in situ)   HLD (hyperlipidemia)    on crestor    Hyperplasia of prostate without lower urinary tract symptoms (LUTS)    Lung cancer (HCC) 12/07/2022   Nephrolithiasis    bilateral non-obstructive   Pneumonia    x 1 - yrs ago   Pulmonary nodules 11/2022   2 right upper lobe pulmonary nodules   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HCC)    Renal cyst, acquired, right    right kidney removed   Wears glasses     SURGICAL HISTORY: Past Surgical History:  Procedure Laterality Date   APPENDECTOMY  child   BRONCHIAL BIOPSY  12/07/2022   Procedure: BRONCHIAL BIOPSIES;  Surgeon: Prudy Brownie, DO;  Location: [facility];  Service: Pulmonary;;   BRONCHIAL BRUSHINGS  12/07/2022   Procedure: BRONCHIAL BRUSHINGS;  Surgeon: Prudy Brownie, DO;  Location: [facility];  Service: Pulmonary;;   BRONCHIAL NEEDLE ASPIRATION BIOPSY  12/07/2022   Procedure: BRONCHIAL NEEDLE ASPIRATION BIOPSIES;  Surgeon: Prudy Brownie, DO;  Location: [facility];  Service: Pulmonary;;   CATARACT EXTRACTION W/ INTRAOCULAR LENS  IMPLANT, BILATERAL  2017   COLONOSCOPY  08/15/2012   Tubular Adenoma, No high grade dysplasia or malignacy.   CYSTOSCOPY W/ RETROGRADES Right 09/25/2016   Procedure: CYSTOSCOPY, URETHRAL DILITATION  WITH RETROGRADE PYELOGRAM, BRUSH BIOPSIES OF RIGHT RENAL MASS;  Surgeon: Annamarie Kid, MD;  Location: [facility];  Service: Urology;  Laterality: Right;   CYSTOSCOPY W/ RETROGRADES Left 03/16/2017   Procedure: CYSTOSCOPY WITH RETROGRADE PYELOGRAM;  Surgeon: Andrez Banker, MD;  Location: [facility];  Service: Urology;  Laterality: Left;   CYSTOSCOPY W/ URETERAL STENT REMOVAL Right 09/25/2016   Procedure: CYSTOSCOPY WITH STENT REMOVAL;   Surgeon: Annamarie Kid, MD;  Location: [facility];  Service: Urology;  Laterality: Right;   CYSTOSCOPY WITH RETROGRADE PYELOGRAM, URETEROSCOPY AND STENT PLACEMENT Right 08/21/2016   Procedure: CYSTOSCOPY WITH RIGHT RETROGRADE PYELOGRAM, RIGHT FLEXIBLE AND RIGID URETEROSCOPY, INSERTION DOUBLE J STENT RIGHT;  Surgeon: Annamarie Kid, MD;  Location: [facility];  Service: Urology;  Laterality: Right;   EXTRACORPOREAL SHOCK WAVE LITHOTRIPSY  2009   IR IMAGING GUIDED PORT INSERTION  07/09/2023   KNEE SURGERY Right 1980's   ROBOT ASSITED LAPAROSCOPIC NEPHROURETERECTOMY Right 11/15/2016   Procedure: RIGHT XI ROBOT ASSITED LAPAROSCOPIC NEPHROURETERECTOMY;  Surgeon: Andrez Banker, MD;  Location: [facility];  Service: Urology;  Laterality: Right;   TRANSURETHRAL RESECTION OF BLADDER TUMOR WITH MITOMYCIN -C Left 03/16/2017   Procedure: CYSTOSCOPY BIOPSIES OF BLADDER TUMOR WITH FULGURATION;  Surgeon: Andrez Banker, MD;  Location: [facility];  Service: Urology;  Laterality: Left;   URETEROSCOPY Right 09/25/2016   Procedure: RIGHT URETEROSCOPY;  Surgeon: Annamarie Kid, MD;  Location: [facility];  Service: Urology;  Laterality: Right;   VIDEO BRONCHOSCOPY WITH ENDOBRONCHIAL ULTRASOUND Right 12/07/2022   Procedure: VIDEO BRONCHOSCOPY WITH ENDOBRONCHIAL ULTRASOUND;  Surgeon: Prudy Brownie, DO;  Location: [facility];  Service: Pulmonary;  Laterality: Right;    SOCIAL HISTORY: Social History   Socioeconomic History   Marital status: Married    Spouse name: Not on file   Number of children: Not on file   Years of education: Not on file   Highest education level: Not on file  Occupational History   Occupation: Production designer, theatre/television/film - [employer]  Tobacco Use   Smoking status: Former    Current packs/day: 0.00    Average packs/day: 1 pack/day for 30.0 years (30.0 ttl pk-yrs)    Types: Cigarettes    Start date: 11/14/1955    Quit date:  11/13/1985    Years since quitting: 38.3    Passive exposure: Past   Smokeless tobacco: Never  Vaping Use   Vaping status: Never Used  Substance and Sexual Activity   Alcohol  use: Yes    Alcohol /week: 3.0 standard drinks of alcohol     Types: 3 Glasses of wine per week    Comment: occasional wine   Drug use: No   Sexual activity: Not on file  Other Topics Concern   Not on file  Social History Narrative   Not on file   Social Drivers of Health   Financial Resource Strain: Not on file  Food Insecurity: No Food Insecurity (12/24/2022)   Hunger Vital Sign    Worried About Running Out of Food in the Last Year: Never true    Ran Out of Food in the Last Year: Never true  Transportation Needs: No Transportation Needs (12/24/2022)   PRAPARE - Administrator, Civil Service (Medical): No    Lack of Transportation (Non-Medical): No  Physical Activity: Not on file  Stress: Not on file  Social Connections: Not on file  Intimate Partner Violence: Not At Risk (12/24/2022)   Humiliation, Afraid, Rape, and Kick questionnaire    Fear of Current or Ex-Partner: No  Emotionally Abused: No    Physically Abused: No    Sexually Abused: No    FAMILY HISTORY: Family History  Problem Relation Age of Onset   Heart disease Mother    Cancer Father    Cancer Paternal Grandmother    Heart disease Paternal Grandfather     ALLERGIES:  is allergic to lamisil [terbinafine] and ivp dye [iodinated contrast media].  MEDICATIONS:  Current Outpatient Medications  Medication Sig Dispense Refill   cholecalciferol  (VITAMIN D3) 25 MCG (1000 UNIT) tablet Take 1,000 Units by mouth daily.     diphenhydrAMINE  (BENADRYL ) 50 MG tablet Take 1 tablet 2 hours before the CT scan. 1 tablet 0   lidocaine -prilocaine  (EMLA ) cream Apply small amount cream to port a cath site 30-60 minutes prior to accessing port 30 g 0   methylPREDNISolone (MEDROL DOSEPAK) 4 MG TBPK tablet Take 6 pills by mouth day 1, 5 on day 2,  4 on day 3, 3 on day 4, 2 on day 5, 1 on day 6 21 tablet 0   Multiple Vitamins-Minerals (MULTIVITAMIN WITH MINERALS) tablet Take 1 tablet by mouth daily. Centrum     predniSONE  (DELTASONE ) 50 MG tablet 1 tablet p.o. 14 hours then 7 hours then 2 hours before the scan. Please take Benadryl  50 mg p.o. x 1 2 hours before the scan with the last dose of prednisone . 3 tablet 0   prochlorperazine  (COMPAZINE ) 10 MG tablet Take 10 mg by mouth every 6 (six) hours as needed for nausea or vomiting.     rosuvastatin  (CRESTOR ) 10 MG tablet TAKE 1 TABLET EACH DAY. (Patient taking differently: Take 20 mg by mouth daily.) 30 tablet 0   silver  sulfADIAZINE  (SILVADENE ) 1 % cream Apply 1 Application topically daily. 50 g 0   sucralfate  (CARAFATE ) 1 g tablet Take 1 tablet (1 g total) by mouth 4 (four) times daily -  with meals and at bedtime. Dissolve tablet in water  -swish and swallow. 30 tablet 2   tamsulosin  (FLOMAX ) 0.4 MG CAPS capsule Take 1 capsule (0.4 mg total) by mouth daily. (Patient taking differently: Take 0.4 mg by mouth daily after breakfast.) 30 capsule 5   No current facility-administered medications for this visit.    REVIEW OF SYSTEMS:   Constitutional: ( - ) fevers, ( - )  chills , ( - ) night sweats Eyes: ( - ) blurriness of vision, ( - ) double vision, ( - ) watery eyes Ears, nose, mouth, throat, and face: ( - ) mucositis, ( - ) sore throat Respiratory: ( - ) cough, ( - ) dyspnea, ( - ) wheezes Cardiovascular: ( - ) palpitation, ( - ) chest discomfort, ( - ) lower extremity swelling Gastrointestinal:  ( - ) nausea, ( - ) heartburn, ( - ) change in bowel habits Skin: (+) abnormal skin rashes Lymphatics: ( - ) new lymphadenopathy, ( - ) easy bruising Neurological: ( - ) numbness, ( - ) tingling, ( - ) new weaknesses Behavioral/Psych: ( - ) mood change, ( - ) new changes  All other systems were reviewed with the patient and are negative.  PHYSICAL EXAMINATION: ECOG PERFORMANCE STATUS: 1 -  Symptomatic but completely ambulatory  Vitals:   03/19/24 1237  BP: 137/68  Pulse: 75  Resp: 16  Temp: 97.7 F (36.5 C)  SpO2: 100%   Filed Weights   03/19/24 1237  Weight: 141 lb 9.6 oz (64.2 kg)    GENERAL: well appearing male in NAD  SKIN: skin color,  texture, turgor are normal  EYES: conjunctiva are pink and non-injected, sclera clear Musculoskeletal: no cyanosis of digits and no clubbing  PSYCH: alert & oriented x 3, fluent speech NEURO: no focal motor/sensory deficits  LABORATORY DATA:  I have reviewed the data as listed    Latest Ref Rng & Units 03/19/2024   11:55 AM 03/16/2024    2:14 PM 03/13/2024   10:16 AM  CBC  WBC 4.0 - 10.5 K/uL 1.5  4.2  2.7   Hemoglobin 13.0 - 17.0 g/dL 91.4  78.2  95.6   Hematocrit 39.0 - 52.0 % 33.4  36.3  36.8   Platelets 150 - 400 K/uL 57  89  134        Latest Ref Rng & Units 03/19/2024   11:55 AM 03/16/2024    2:14 PM 03/13/2024   10:16 AM  CMP  Glucose 70 - 99 mg/dL 96  213  90   BUN 8 - 23 mg/dL 25  25  20    Creatinine 0.61 - 1.24 mg/dL 0.86  5.78  4.69   Sodium 135 - 145 mmol/L 139  139  139   Potassium 3.5 - 5.1 mmol/L 3.9  4.0  4.1   Chloride 98 - 111 mmol/L 105  102  108   CO2 22 - 32 mmol/L 27  24  28    Calcium  8.9 - 10.3 mg/dL 8.7  9.7  8.8   Total Protein 6.5 - 8.1 g/dL 6.7  7.0  7.1   Total Bilirubin 0.0 - 1.2 mg/dL 0.6  0.9  0.5   Alkaline Phos 38 - 126 U/L 85  114  97   AST 15 - 41 U/L 33  66  27   ALT 0 - 44 U/L 46  78  38     ASSESSMENT & PLAN Alan Mendez is a 83 y.o. male who presents for a symptom management clinic for evaluation of a rash.   #Erythematous rash involving arms and inner thighs: --Most likely secondary to gemcitabine  +/- immunotherapy --Currently taking benadryl  PO night which is causing drowsiness and started topical hydrocortisone cream yesterday.  --Suggested to switch to second generation antihistamine such as Claritin or Zyrtec to minimize drowsiness. Recommend to continue hydrocortisone  cream for the next 1-2 days. If there is no improvement, then start Medrol dose pack. A prescription was sent to his pharmacy on file --If rash starts to spread more diffusely, advise to follow up in clinic to further evaluate.  --Scheduled to return to clinic on 03/27/2024 for labs/visit prior to Cycle 2, Day 1.   #Cytopenias: --Secondary to chemotherapy. WBC 1.5, ANC 1.2, Hgb 11.2, Plt 57. --No overt signs of bleeding. Strict precautions to follow up if there is signs of bleeding. Avoid riding his bike due to fall risk.  --Strict neutropenic precautions including monitoring for fevers/infection.   No orders of the defined types were placed in this encounter.   All questions were answered. The patient knows to call the clinic with any problems, questions or concerns.  I have spent a total of 25 minutes minutes of face-to-face and non-face-to-face time, preparing to see the patient,  performing a medically appropriate examination, counseling and educating the patient, ordering medications, referring and communicating with other health care professionals, documenting clinical information in the electronic health record, independently interpreting results and communicating results to the patient, and care coordination.   Wyline Hearing, PA-C Department of Hematology/Oncology [facility] Phone: [phone]

## 2024-03-20 ENCOUNTER — Encounter: Payer: Self-pay | Admitting: Internal Medicine

## 2024-03-25 NOTE — Progress Notes (Unsigned)
 [facility] OFFICE PROGRESS NOTE  [name], Kristina Pfeiffer, MD [address] [place] [address]  DIAGNOSIS:  1) Initially diagnosed as Stage IIIb (T3, N2, M0) non-small cell lung cancer, squamous cell carcinoma presented with 2 separate right upper lobe lung nodule in addition to mediastinal lymphadenopathy diagnosed in January 2024. 2) left thyroid  nodule.   PDL1 expression: 1%   PRIOR THERAPY: 1) A course of concurrent chemoradiation with weekly carboplatin  for AUC of 2 and paclitaxel  45 Mg/M2. Last dose on 02/05/23. Status post 6 cycles. Taxol  changed to abraxane  starting from cycle #4 due to reaction to taxol   2) Consolidation immunotherapy with Imfinzi  1500 mg IV every 4 weeks.  First dose expected on 03/12/23.  Status post 7 cycles. 3) CT-guided pulsed field electroporation of the right suprahilar mass under the care of Dr. Nowell Begun at [facility] on 02/12/2024.  CURRENT THERAPY: Systemic chemotherapy with Carboplatin  for AUC of 5 on day 1, gemcitabine  1000 Mg/M2 on days 1 and 8 as well as Libtayo  (Cempilimab) 350 Mg IV on day 1 every 3 weeks. First dose March 06, 2024. Status post 1 cycle.   INTERVAL HISTORY: Alan Mendez 83 y.o. male returns to clinic today for follow-up visit.  The patient was last seen by Dr. Marguerita Shih on 03/13/2024.  In summary, the patient was found to have disease progression in February 2025.  He underwent CT-guided pulsed field electroporation of the right suprahilar mass by [employer].  He is currently undergoing systemic chemotherapy with carboplatin , gemcitabine , and Libtayo .  He experienced fatigue following treatment.  He also was seen by one of my colleagues last week for a rash which was consistent with the gemcitabine  rash.  He was given ***and his rash has ***  The patient took Benadryl  and Zyrtec.  He localizes rash to the arms and groin.  The rash is pruritic without any discharge or vesicles.  Due to drowsiness with  Benadryl  he was recommended to switch to Claritin or Zyrtec.  If there is no improvement then to start a Medrol  Dosepak.  The patient needed to ***take the Medrol  Dosepak.  The patient denies any fever, chills, or night sweats.  Appetite?  Breathing?  Shortness of breath?  Cough?  He denies any hemoptysis or chest pain.  Denies any nausea, vomiting, diarrhea, or constipation.  Denies any headache or visual changes.  He is here today for evaluation repeat blood work before undergoing cycle #2.     MEDICAL HISTORY: Past Medical History:  Diagnosis Date   Acquired solitary kidney    left --  s/p  right nephroureterctomy 01/ 2018   Anxiety    no current problems   Depression    no current problems   Elevated PSA    Enlarged lymph node 11/2022   enlarged right paratracheal lymph node.   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, negative margins)   History of kidney stones    passed stones and also surgery to remove   History of unilateral nephrectomy    01/ 2018  right nephroureterectomy for renal pelvis mass (carcinoma in situ)   HLD (hyperlipidemia)    on crestor    Hyperplasia of prostate without lower urinary tract symptoms (LUTS)    Lung cancer (HCC)  12/07/2022   Nephrolithiasis    bilateral non-obstructive   Pneumonia    x 1 - yrs ago   Pulmonary nodules 11/2022   2 right upper lobe pulmonary nodules   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HCC)    Renal cyst, acquired, right    right kidney removed   Wears glasses     ALLERGIES:  is allergic to lamisil [terbinafine] and ivp dye [iodinated contrast media].  MEDICATIONS:  Current Outpatient Medications  Medication Sig Dispense Refill   cholecalciferol  (VITAMIN D3) 25 MCG (1000 UNIT) tablet Take 1,000  Units by mouth daily.     diphenhydrAMINE  (BENADRYL ) 50 MG tablet Take 1 tablet 2 hours before the CT scan. 1 tablet 0   lidocaine -prilocaine  (EMLA ) cream Apply small amount cream to port a cath site 30-60 minutes prior to accessing port 30 g 0   methylPREDNISolone  (MEDROL  DOSEPAK) 4 MG TBPK tablet Take 6 pills by mouth day 1, 5 on day 2, 4 on day 3, 3 on day 4, 2 on day 5, 1 on day 6 21 tablet 0   Multiple Vitamins-Minerals (MULTIVITAMIN WITH MINERALS) tablet Take 1 tablet by mouth daily. Centrum     predniSONE  (DELTASONE ) 50 MG tablet 1 tablet p.o. 14 hours then 7 hours then 2 hours before the scan. Please take Benadryl  50 mg p.o. x 1 2 hours before the scan with the last dose of prednisone . 3 tablet 0   prochlorperazine  (COMPAZINE ) 10 MG tablet Take 10 mg by mouth every 6 (six) hours as needed for nausea or vomiting.     rosuvastatin  (CRESTOR ) 10 MG tablet TAKE 1 TABLET EACH DAY. (Patient taking differently: Take 20 mg by mouth daily.) 30 tablet 0   silver  sulfADIAZINE  (SILVADENE ) 1 % cream Apply 1 Application topically daily. 50 g 0   sucralfate  (CARAFATE ) 1 g tablet Take 1 tablet (1 g total) by mouth 4 (four) times daily -  with meals and at bedtime. Dissolve tablet in water  -swish and swallow. 30 tablet 2   tamsulosin  (FLOMAX ) 0.4 MG CAPS capsule Take 1 capsule (0.4 mg total) by mouth daily. (Patient taking differently: Take 0.4 mg by mouth daily after breakfast.) 30 capsule 5   No current facility-administered medications for this visit.    SURGICAL HISTORY:  Past Surgical History:  Procedure Laterality Date   APPENDECTOMY  child   BRONCHIAL BIOPSY  12/07/2022   Procedure: BRONCHIAL BIOPSIES;  Surgeon: Prudy Brownie, DO;  Location: [facility];  Service: Pulmonary;;   BRONCHIAL BRUSHINGS  12/07/2022   Procedure: BRONCHIAL BRUSHINGS;  Surgeon: Prudy Brownie, DO;  Location: [facility];  Service: Pulmonary;;   BRONCHIAL NEEDLE ASPIRATION BIOPSY  12/07/2022   Procedure: BRONCHIAL  NEEDLE ASPIRATION BIOPSIES;  Surgeon: Prudy Brownie, DO;  Location: [facility];  Service: Pulmonary;;   CATARACT EXTRACTION W/ INTRAOCULAR LENS  IMPLANT, BILATERAL  2017   COLONOSCOPY  08/15/2012   Tubular Adenoma, No high grade dysplasia or malignacy.   CYSTOSCOPY W/ RETROGRADES Right 09/25/2016   Procedure: CYSTOSCOPY, URETHRAL DILITATION  WITH RETROGRADE PYELOGRAM, BRUSH BIOPSIES OF RIGHT RENAL MASS;  Surgeon: Annamarie Kid, MD;  Location[facility];  Service: Urology;  Laterality: Right;   CYSTOSCOPY W/ RETROGRADES Left 03/16/2017   Procedure: CYSTOSCOPY WITH RETROGRADE PYELOGRAM;  Surgeon: Andrez Banker, MD;  Location: [facility];  Service: Urology;  Laterality: Left;   CYSTOSCOPY W/ URETERAL STENT REMOVAL Right 09/25/2016   Procedure: CYSTOSCOPY WITH STENT REMOVAL;  Surgeon: Annamarie Kid, MD;  Location: [facility];  Service: Urology;  Laterality: Right;   CYSTOSCOPY WITH RETROGRADE PYELOGRAM, URETEROSCOPY AND STENT PLACEMENT Right 08/21/2016   Procedure: CYSTOSCOPY WITH RIGHT RETROGRADE PYELOGRAM, RIGHT FLEXIBLE AND RIGID URETEROSCOPY, INSERTION DOUBLE J STENT RIGHT;  Surgeon: Annamarie Kid, MD;  Location: [facility];  Service: Urology;  Laterality: Right;   EXTRACORPOREAL SHOCK WAVE LITHOTRIPSY  2009   IR IMAGING GUIDED PORT INSERTION  07/09/2023   KNEE SURGERY Right 1980's   ROBOT ASSITED LAPAROSCOPIC NEPHROURETERECTOMY Right 11/15/2016   Procedure: RIGHT XI ROBOT ASSITED LAPAROSCOPIC NEPHROURETERECTOMY;  Surgeon: Andrez Banker, MD;  Location: [facility];  Service: Urology;  Laterality: Right;   TRANSURETHRAL RESECTION OF BLADDER TUMOR WITH MITOMYCIN -C Left 03/16/2017   Procedure: CYSTOSCOPY BIOPSIES OF BLADDER TUMOR WITH FULGURATION;  Surgeon: Andrez Banker, MD;  Location: [facility];  Service: Urology;  Laterality: Left;   URETEROSCOPY Right 09/25/2016   Procedure: RIGHT  URETEROSCOPY;  Surgeon: Annamarie Kid, MD;  Location: [facility];  Service: Urology;  Laterality: Right;   VIDEO BRONCHOSCOPY WITH ENDOBRONCHIAL ULTRASOUND Right 12/07/2022   Procedure: VIDEO BRONCHOSCOPY WITH ENDOBRONCHIAL ULTRASOUND;  Surgeon: Prudy Brownie, DO;  Location: [facility];  Service: Pulmonary;  Laterality: Right;    REVIEW OF SYSTEMS:   Review of Systems  Constitutional: Negative for appetite change, chills, fatigue, fever and unexpected weight change.  HENT:   Negative for mouth sores, nosebleeds, sore throat and trouble swallowing.   Eyes: Negative for eye problems and icterus.  Respiratory: Negative for cough, hemoptysis, shortness of breath and wheezing.   Cardiovascular: Negative for chest pain and leg swelling.  Gastrointestinal: Negative for abdominal pain, constipation, diarrhea, nausea and vomiting.  Genitourinary: Negative for bladder incontinence, difficulty urinating, dysuria, frequency and hematuria.   Musculoskeletal: Negative for back pain, gait problem, neck pain and neck stiffness.  Skin: Negative for itching and rash.  Neurological: Negative for dizziness, extremity weakness, gait problem, headaches, light-headedness and seizures.  Hematological: Negative for adenopathy. Does not bruise/bleed easily.  Psychiatric/Behavioral: Negative for confusion, depression and sleep disturbance. The patient is not nervous/anxious.     PHYSICAL EXAMINATION:  There were no vitals taken for this visit.  ECOG PERFORMANCE STATUS: {CHL ONC ECOG [id number]  Physical Exam  Constitutional: Oriented to person, place, and time and well-developed, well-nourished, and in no distress. No distress.  HENT:  Head: Normocephalic and atraumatic.  Mouth/Throat: Oropharynx is clear and moist. No oropharyngeal exudate.  Eyes: Conjunctivae are normal. Right eye exhibits no discharge. Left eye exhibits no discharge. No scleral icterus.  Neck: Normal range of  motion. Neck supple.  Cardiovascular: Normal rate, regular rhythm, normal heart sounds and intact distal pulses.   Pulmonary/Chest: Effort normal and breath sounds normal. No respiratory distress. No wheezes. No rales.  Abdominal: Soft. Bowel sounds are normal. Exhibits no distension and no mass. There is no tenderness.  Musculoskeletal: Normal range of motion. Exhibits no edema.  Lymphadenopathy:    No cervical adenopathy.  Neurological: Alert and oriented to person, place, and time. Exhibits normal muscle tone. Gait normal. Coordination normal.  Skin: Skin is warm and dry. No rash noted. Not diaphoretic. No erythema. No pallor.  Psychiatric: Mood, memory and judgment normal.  Vitals reviewed.  LABORATORY DATA: Lab Results  Component Value Date   WBC 1.5 (L) 03/19/2024   HGB 11.2 (L) 03/19/2024   HCT 33.4 (L) 03/19/2024   MCV 80.3 03/19/2024   PLT 57 (L) 03/19/2024      Chemistry  Component Value Date/Time   NA 139 03/19/2024 1155   K 3.9 03/19/2024 1155   CL 105 03/19/2024 1155   CO2 27 03/19/2024 1155   BUN 25 (H) 03/19/2024 1155   CREATININE 1.01 03/19/2024 1155   CREATININE 0.85 12/10/2014 1550      Component Value Date/Time   CALCIUM  8.7 (L) 03/19/2024 1155   ALKPHOS 85 03/19/2024 1155   AST 33 03/19/2024 1155   ALT 46 (H) 03/19/2024 1155   BILITOT 0.6 03/19/2024 1155       RADIOGRAPHIC STUDIES:  No results found.   ASSESSMENT/PLAN:  This is a very pleasant 83 year old Caucasian male diagnosed with stage IIIb (T3, N2, M0) non-small cell lung cancer, squamous cell carcinoma. He presented with 2 separate right upper lobe lung nodules in addition to mediastinal lymphadenopathy. He was diagnosed in January 2024. His PDL1 expression is 1%.    The patient completed a course of concurrent chemoradiation with weekly carboplatin  for AUC of 2 and paclitaxel  45 Mg/M2.  Status post 6 cycles. Starting from cycle #4, the taxol  was switched to abraxane  due to an infusion  reaction to taxol .  The patient's last chemotherapy was on 02/05/2023. He saw Dr. Jeryl Moris from radiation oncology.   He then was on consolidation immunotherapy with Imfinzi  1500 milligrams IV every 4 weeks.  He is status post 7 cycles. His last dose was on 08/27/23.    His CT scan from October 2024 showed parahilar scarring/fibrosis. Small nodules in the medial suprahilar right lung previously have become incorporated into this more confluent soft tissue density showing a masslike nodular character measuring 4.3 x 1.7 cm. While this masslike component may be related to confluent scarring, recurrent/residual disease is a concern per radiology.   Dr. Marguerita Shih recommended a PET scan to further evaluate. The scan showed intensely hypermetabolic solid 4.2 cm medial right upper lobe lung mass, compatible with persistent viable malignancy, with surrounding evolving post radiation change.    The patient [facility] Hermon Lor [facility] The patient was not a surgical candidate.   The patient underwent CT-guided pulsed field electroporation of the right suprahilar mass under the care of Dr. Nowell Begun at [facility] on 02/12/2024.  Patient is currently undergoing palliative systemic chemotherapy and immunotherapy with carboplatin  for AUC of 5 on day 1, gemcitabine  1000 mg on days 1 and 8, and Libtayo  350 mg on day 1 IV every 3 weeks.  The patient is status post 1 cycle of treatment.  The patient did have some cytopenias but did not require any supportive care.  The patient also experienced a rash most consistent with gemcitabine  rash which resolved with **  Therefore Dr. Marguerita Shih recommended ***  Labs were reviewed.  Recommend **dose?  Premeds?  We will see him back in 3 weeks for evaluation repeat blood work before undergoing cycle #3.  The patient was advised to call immediately if she has any concerning symptoms in the interval. The patient voices understanding of current disease status and treatment  options and is in agreement with the current care plan. All questions were answered. The patient knows to call the clinic with any problems, questions or concerns. We can certainly see the patient much sooner if necessary          No orders of the defined types were placed in this encounter.    I spent {CHL ONC TIME VISIT - EAVWU:[id number]} counseling the patient face to face. The total time spent in the appointment was {CHL ONC  TIME VISIT - ZOXWR:[id number]}.  Cyndi Montejano L Ivin Rosenbloom, PA-C 03/25/24

## 2024-03-26 MED FILL — Fosaprepitant Dimeglumine For IV Infusion 150 MG (Base Eq): INTRAVENOUS | Qty: 5 | Status: AC

## 2024-03-27 ENCOUNTER — Inpatient Hospital Stay (HOSPITAL_BASED_OUTPATIENT_CLINIC_OR_DEPARTMENT_OTHER): Admitting: Physician Assistant

## 2024-03-27 ENCOUNTER — Inpatient Hospital Stay

## 2024-03-27 VITALS — BP 134/88 | HR 76 | Temp 97.7°F | Resp 14 | Wt 143.8 lb

## 2024-03-27 VITALS — BP 122/67 | HR 74

## 2024-03-27 DIAGNOSIS — Z95828 Presence of other vascular implants and grafts: Secondary | ICD-10-CM

## 2024-03-27 DIAGNOSIS — C3491 Malignant neoplasm of unspecified part of right bronchus or lung: Secondary | ICD-10-CM

## 2024-03-27 DIAGNOSIS — Z5111 Encounter for antineoplastic chemotherapy: Secondary | ICD-10-CM

## 2024-03-27 DIAGNOSIS — R590 Localized enlarged lymph nodes: Secondary | ICD-10-CM | POA: Diagnosis not present

## 2024-03-27 DIAGNOSIS — C679 Malignant neoplasm of bladder, unspecified: Secondary | ICD-10-CM

## 2024-03-27 DIAGNOSIS — Z5112 Encounter for antineoplastic immunotherapy: Secondary | ICD-10-CM

## 2024-03-27 DIAGNOSIS — C3490 Malignant neoplasm of unspecified part of unspecified bronchus or lung: Secondary | ICD-10-CM

## 2024-03-27 DIAGNOSIS — D701 Agranulocytosis secondary to cancer chemotherapy: Secondary | ICD-10-CM | POA: Diagnosis not present

## 2024-03-27 DIAGNOSIS — R911 Solitary pulmonary nodule: Secondary | ICD-10-CM | POA: Diagnosis not present

## 2024-03-27 DIAGNOSIS — C3411 Malignant neoplasm of upper lobe, right bronchus or lung: Secondary | ICD-10-CM | POA: Diagnosis not present

## 2024-03-27 LAB — CBC WITH DIFFERENTIAL (CANCER CENTER ONLY)
Abs Immature Granulocytes: 0.04 10*3/uL (ref 0.00–0.07)
Basophils Absolute: 0 10*3/uL (ref 0.0–0.1)
Basophils Relative: 1 %
Eosinophils Absolute: 0.2 10*3/uL (ref 0.0–0.5)
Eosinophils Relative: 6 %
HCT: 33.5 % — ABNORMAL LOW (ref 39.0–52.0)
Hemoglobin: 11.5 g/dL — ABNORMAL LOW (ref 13.0–17.0)
Immature Granulocytes: 1 %
Lymphocytes Relative: 19 %
Lymphs Abs: 0.6 10*3/uL — ABNORMAL LOW (ref 0.7–4.0)
MCH: 27.9 pg (ref 26.0–34.0)
MCHC: 34.3 g/dL (ref 30.0–36.0)
MCV: 81.3 fL (ref 80.0–100.0)
Monocytes Absolute: 0.5 10*3/uL (ref 0.1–1.0)
Monocytes Relative: 16 %
Neutro Abs: 1.6 10*3/uL — ABNORMAL LOW (ref 1.7–7.7)
Neutrophils Relative %: 57 %
Platelet Count: 440 10*3/uL — ABNORMAL HIGH (ref 150–400)
RBC: 4.12 MIL/uL — ABNORMAL LOW (ref 4.22–5.81)
RDW: 13.2 % (ref 11.5–15.5)
WBC Count: 2.9 10*3/uL — ABNORMAL LOW (ref 4.0–10.5)
nRBC: 0 % (ref 0.0–0.2)

## 2024-03-27 LAB — CMP (CANCER CENTER ONLY)
ALT: 35 U/L (ref 0–44)
AST: 32 U/L (ref 15–41)
Albumin: 3.9 g/dL (ref 3.5–5.0)
Alkaline Phosphatase: 93 U/L (ref 38–126)
Anion gap: 5 (ref 5–15)
BUN: 23 mg/dL (ref 8–23)
CO2: 27 mmol/L (ref 22–32)
Calcium: 8.8 mg/dL — ABNORMAL LOW (ref 8.9–10.3)
Chloride: 109 mmol/L (ref 98–111)
Creatinine: 1 mg/dL (ref 0.61–1.24)
GFR, Estimated: >60 mL/min (ref >60)
Glucose, Bld: 109 mg/dL — ABNORMAL HIGH (ref 70–99)
Potassium: 3.9 mmol/L (ref 3.5–5.1)
Sodium: 141 mmol/L (ref 135–145)
Total Bilirubin: 0.4 mg/dL (ref 0.0–1.2)
Total Protein: 6.8 g/dL (ref 6.5–8.1)

## 2024-03-27 MED ORDER — SODIUM CHLORIDE 0.9% FLUSH
10.0000 mL | INTRAVENOUS | Status: DC | PRN
  Administered 2024-03-27: 10 mL

## 2024-03-27 MED ORDER — PALONOSETRON HCL INJECTION 0.25 MG/5ML
0.2500 mg | Freq: Once | INTRAVENOUS | Status: AC
  Administered 2024-03-27: 0.25 mg via INTRAVENOUS
  Filled 2024-03-27: qty 5

## 2024-03-27 MED ORDER — HEPARIN SOD (PORK) LOCK FLUSH 100 UNIT/ML IV SOLN
500.0000 [IU] | Freq: Once | INTRAVENOUS | Status: AC | PRN
Start: 2024-03-27 — End: 2024-03-27
  Administered 2024-03-27: 500 [IU]

## 2024-03-27 MED ORDER — SODIUM CHLORIDE 0.9 % IV SOLN: INTRAVENOUS | Status: DC

## 2024-03-27 MED ORDER — SODIUM CHLORIDE 0.9 % IV SOLN
1000.0000 mg/m2 | Freq: Once | INTRAVENOUS | Status: AC
  Administered 2024-03-27: 1710 mg via INTRAVENOUS
  Filled 2024-03-27: qty 44.97

## 2024-03-27 MED ORDER — SODIUM CHLORIDE 0.9 % IV SOLN
390.0000 mg | Freq: Once | INTRAVENOUS | Status: AC
  Administered 2024-03-27: 390 mg via INTRAVENOUS
  Filled 2024-03-27: qty 39

## 2024-03-27 MED ORDER — CEMIPLIMAB-RWLC CHEMO INJECTION 350 MG/7ML
350.0000 mg | Freq: Once | INTRAVENOUS | Status: AC
  Administered 2024-03-27: 350 mg via INTRAVENOUS
  Filled 2024-03-27: qty 7

## 2024-03-27 MED ORDER — SODIUM CHLORIDE 0.9 % IV SOLN
150.0000 mg | Freq: Once | INTRAVENOUS | Status: AC
  Administered 2024-03-27: 150 mg via INTRAVENOUS
  Filled 2024-03-27: qty 150

## 2024-03-27 MED ORDER — SODIUM CHLORIDE 0.9% FLUSH
10.0000 mL | Freq: Once | INTRAVENOUS | Status: AC
  Administered 2024-03-27: 10 mL

## 2024-03-27 MED ORDER — DEXAMETHASONE SODIUM PHOSPHATE 10 MG/ML IJ SOLN
10.0000 mg | Freq: Once | INTRAMUSCULAR | Status: AC
  Administered 2024-03-27: 10 mg via INTRAVENOUS
  Filled 2024-03-27: qty 1

## 2024-03-27 NOTE — Patient Instructions (Signed)
 [facility] - A DEPT OF [facility]  Discharge Instructions: Thank you for choosing [facility] to provide your oncology and hematology care.   If you have a lab appointment with the Cancer Center, please go directly to the Cancer Center and check in at the registration area.   Wear comfortable clothing and clothing appropriate for easy access to any Portacath or PICC line.   We strive to give you quality time with your provider. You may need to reschedule your appointment if you arrive late (15 or more minutes).  Arriving late affects you and other patients whose appointments are after yours.  Also, if you miss three or more appointments without notifying the office, you may be dismissed from the clinic at the provider's discretion.      For prescription refill requests, have your pharmacy contact our office and allow 72 hours for refills to be completed.    Today you received the following chemotherapy and/or immunotherapy agents: Cemiplimab , Gemcitabine , Carboplatin       To help prevent nausea and vomiting after your treatment, we encourage you to take your nausea medication as directed.  BELOW ARE SYMPTOMS THAT SHOULD BE REPORTED IMMEDIATELY: *FEVER GREATER THAN 100.4 F (38 C) OR HIGHER *CHILLS OR SWEATING *NAUSEA AND VOMITING THAT IS NOT CONTROLLED WITH YOUR NAUSEA MEDICATION *UNUSUAL SHORTNESS OF BREATH *UNUSUAL BRUISING OR BLEEDING *URINARY PROBLEMS (pain or burning when urinating, or frequent urination) *BOWEL PROBLEMS (unusual diarrhea, constipation, pain near the anus) TENDERNESS IN MOUTH AND THROAT WITH OR WITHOUT PRESENCE OF ULCERS (sore throat, sores in mouth, or a toothache) UNUSUAL RASH, SWELLING OR PAIN  UNUSUAL VAGINAL DISCHARGE OR ITCHING   Items with * indicate a potential emergency and should be followed up as soon as possible or go to the Emergency Department if any problems should occur.  Please show the CHEMOTHERAPY  ALERT CARD or IMMUNOTHERAPY ALERT CARD at check-in to the Emergency Department and triage nurse.  Should you have questions after your visit or need to cancel or reschedule your appointment, please contact [facility] - A DEPT OF [facility]  Dept: [phone]  and follow the prompts.  Office hours are 8:00 a.m. to 4:30 p.m. Monday - Friday. Please note that voicemails left after 4:00 p.m. may not be returned until the following business day.  We are closed weekends and major holidays. You have access to a nurse at all times for urgent questions. Please call the main number to the clinic Dept: [phone] and follow the prompts.   For any non-urgent questions, you may also contact your provider using MyChart. We now offer e-Visits for anyone 16 and older to request care online for non-urgent symptoms. For details visit [email or web address].   Also download the MyChart app! Go to the app store, search "MyChart", open the app, select [place], and log in with your MyChart username and password.

## 2024-03-27 NOTE — Progress Notes (Signed)
 Confirmed Carboplatin  dose of 390 mg (calculated dose) w/ Cassie Heilingoetter, PA.  Roger Fasnacht, Pharm.D., CPP 03/27/2024@9 :27 AM

## 2024-04-03 ENCOUNTER — Ambulatory Visit: Admitting: Internal Medicine

## 2024-04-03 ENCOUNTER — Inpatient Hospital Stay

## 2024-04-03 ENCOUNTER — Other Ambulatory Visit

## 2024-04-03 VITALS — BP 126/67 | HR 75 | Temp 98.0°F | Resp 16 | Wt 140.8 lb

## 2024-04-03 DIAGNOSIS — C3491 Malignant neoplasm of unspecified part of right bronchus or lung: Secondary | ICD-10-CM

## 2024-04-03 DIAGNOSIS — C3490 Malignant neoplasm of unspecified part of unspecified bronchus or lung: Secondary | ICD-10-CM

## 2024-04-03 DIAGNOSIS — R911 Solitary pulmonary nodule: Secondary | ICD-10-CM | POA: Diagnosis not present

## 2024-04-03 DIAGNOSIS — Z95828 Presence of other vascular implants and grafts: Secondary | ICD-10-CM

## 2024-04-03 DIAGNOSIS — C679 Malignant neoplasm of bladder, unspecified: Secondary | ICD-10-CM

## 2024-04-03 DIAGNOSIS — Z5111 Encounter for antineoplastic chemotherapy: Secondary | ICD-10-CM | POA: Diagnosis not present

## 2024-04-03 DIAGNOSIS — Z5112 Encounter for antineoplastic immunotherapy: Secondary | ICD-10-CM | POA: Diagnosis not present

## 2024-04-03 DIAGNOSIS — R590 Localized enlarged lymph nodes: Secondary | ICD-10-CM | POA: Diagnosis not present

## 2024-04-03 DIAGNOSIS — D701 Agranulocytosis secondary to cancer chemotherapy: Secondary | ICD-10-CM | POA: Diagnosis not present

## 2024-04-03 DIAGNOSIS — C3411 Malignant neoplasm of upper lobe, right bronchus or lung: Secondary | ICD-10-CM | POA: Diagnosis not present

## 2024-04-03 LAB — CBC WITH DIFFERENTIAL (CANCER CENTER ONLY)
Abs Immature Granulocytes: 0.06 10*3/uL (ref 0.00–0.07)
Basophils Absolute: 0 10*3/uL (ref 0.0–0.1)
Basophils Relative: 2 %
Eosinophils Absolute: 0 10*3/uL (ref 0.0–0.5)
Eosinophils Relative: 1 %
HCT: 33 % — ABNORMAL LOW (ref 39.0–52.0)
Hemoglobin: 11.3 g/dL — ABNORMAL LOW (ref 13.0–17.0)
Immature Granulocytes: 3 %
Lymphocytes Relative: 21 %
Lymphs Abs: 0.4 10*3/uL — ABNORMAL LOW (ref 0.7–4.0)
MCH: 27.4 pg (ref 26.0–34.0)
MCHC: 34.2 g/dL (ref 30.0–36.0)
MCV: 79.9 fL — ABNORMAL LOW (ref 80.0–100.0)
Monocytes Absolute: 0.2 10*3/uL (ref 0.1–1.0)
Monocytes Relative: 9 %
Neutro Abs: 1.3 10*3/uL — ABNORMAL LOW (ref 1.7–7.7)
Neutrophils Relative %: 64 %
Platelet Count: 189 10*3/uL (ref 150–400)
RBC: 4.13 MIL/uL — ABNORMAL LOW (ref 4.22–5.81)
RDW: 13 % (ref 11.5–15.5)
WBC Count: 2.1 10*3/uL — ABNORMAL LOW (ref 4.0–10.5)
nRBC: 0 % (ref 0.0–0.2)

## 2024-04-03 LAB — CMP (CANCER CENTER ONLY)
ALT: 47 U/L — ABNORMAL HIGH (ref 0–44)
AST: 38 U/L (ref 15–41)
Albumin: 3.9 g/dL (ref 3.5–5.0)
Alkaline Phosphatase: 99 U/L (ref 38–126)
Anion gap: 5 (ref 5–15)
BUN: 18 mg/dL (ref 8–23)
CO2: 27 mmol/L (ref 22–32)
Calcium: 8.9 mg/dL (ref 8.9–10.3)
Chloride: 106 mmol/L (ref 98–111)
Creatinine: 1.02 mg/dL (ref 0.61–1.24)
GFR, Estimated: >60 mL/min (ref >60)
Glucose, Bld: 111 mg/dL — ABNORMAL HIGH (ref 70–99)
Potassium: 4.2 mmol/L (ref 3.5–5.1)
Sodium: 138 mmol/L (ref 135–145)
Total Bilirubin: 0.4 mg/dL (ref 0.0–1.2)
Total Protein: 7 g/dL (ref 6.5–8.1)

## 2024-04-03 MED ORDER — SODIUM CHLORIDE 0.9% FLUSH: 10.0000 mL | INTRAVENOUS | Status: DC | PRN

## 2024-04-03 MED ORDER — HEPARIN SOD (PORK) LOCK FLUSH 100 UNIT/ML IV SOLN: 500.0000 [IU] | Freq: Once | INTRAVENOUS | Status: DC | PRN

## 2024-04-03 MED ORDER — SODIUM CHLORIDE 0.9 % IV SOLN
1000.0000 mg/m2 | Freq: Once | INTRAVENOUS | Status: AC
  Administered 2024-04-03: 1710 mg via INTRAVENOUS
  Filled 2024-04-03: qty 44.97

## 2024-04-03 MED ORDER — SODIUM CHLORIDE 0.9% FLUSH
10.0000 mL | Freq: Once | INTRAVENOUS | Status: AC
  Administered 2024-04-03: 10 mL

## 2024-04-03 MED ORDER — SODIUM CHLORIDE 0.9 % IV SOLN: INTRAVENOUS | Status: DC

## 2024-04-03 MED ORDER — PROCHLORPERAZINE MALEATE 10 MG PO TABS
10.0000 mg | ORAL_TABLET | Freq: Once | ORAL | Status: AC
  Administered 2024-04-03: 10 mg via ORAL
  Filled 2024-04-03: qty 1

## 2024-04-03 NOTE — Progress Notes (Signed)
 Per Dr. Marguerita Shih ok to treat D8/C2 gemcitabine  w/ ANC 1.3

## 2024-04-03 NOTE — Patient Instructions (Signed)

## 2024-04-11 ENCOUNTER — Other Ambulatory Visit: Payer: Self-pay

## 2024-04-14 DIAGNOSIS — E7800 Pure hypercholesterolemia, unspecified: Secondary | ICD-10-CM | POA: Diagnosis not present

## 2024-04-14 DIAGNOSIS — Z79899 Other long term (current) drug therapy: Secondary | ICD-10-CM | POA: Diagnosis not present

## 2024-04-14 DIAGNOSIS — R972 Elevated prostate specific antigen [PSA]: Secondary | ICD-10-CM | POA: Diagnosis not present

## 2024-04-14 DIAGNOSIS — N401 Enlarged prostate with lower urinary tract symptoms: Secondary | ICD-10-CM | POA: Diagnosis not present

## 2024-04-14 NOTE — Progress Notes (Unsigned)
 [facility] OFFICE PROGRESS NOTE  [name], Kristina Pfeiffer, MD [address] [place] [address]  DIAGNOSIS: 1) Initially diagnosed as Stage IIIb (T3, N2, M0) non-small cell lung cancer, squamous cell carcinoma presented with 2 separate right upper lobe lung nodule in addition to mediastinal lymphadenopathy diagnosed in January 2024. 2) left thyroid  nodule.   PDL1 expression: 1%   PRIOR THERAPY: 1) A course of concurrent chemoradiation with weekly carboplatin  for AUC of 2 and paclitaxel  45 Mg/M2. Last dose on 02/05/23. Status post 6 cycles. Taxol  changed to abraxane  starting from cycle #4 due to reaction to taxol   2) Consolidation immunotherapy with Imfinzi  1500 mg IV every 4 weeks.  First dose expected on 03/12/23.  Status post 7 cycles. 3) CT-guided pulsed field electroporation of the right suprahilar mass under the care of Dr. Nowell Begun at [facility] on 02/12/2024.  CURRENT THERAPY: Systemic chemotherapy with Carboplatin  for AUC of 5 on day 1, gemcitabine  1000 Mg/M2 on days 1 and 8 as well as Libtayo  (Cempilimab) 350 Mg IV on day 1 every 3 weeks. First dose March 06, 2024. Status post 2 cycles.   INTERVAL HISTORY: Alan Mendez 83 y.o. male returns to the clinic today for a follow-up visit.  The patient was last seen by Dr. Marguerita Shih and myself on 03/27/2024.  In summary the patient was started on systemic chemotherapy with gemcitabine  and carboplatin .  He developed a rash after cycle #1.  He did not have a rash with cycle #2.    The patient overall is feeling fairly well except he does have some fatigue following treatment. He also had mild dizziness with leaning forward but he increased his hydration and this is a little better now.   He has a thyroid  nodule that was biopsied in October, yielding indeterminate results. Further testing also returned indeterminate results. He had forgotten he had a biopsy and [facility] had arrange for a biopsy in 6 months from when he saw them  and this is scheduled for 04/28/24. He is wondering if he still needs this.   He has some dyspnea with climbing stairs. He is not as active as he was before. He started taking iron supplements daily as well.   He denies fevers, chills, or night sweats. No nausea, vomiting, diarrhea, constipation, headaches, chest pain, or increased coughing. He notes a decrease in energy levels and is consuming Boost nutritional drinks.   He is here today for evaluation repeat blood work before undergoing cycle #3.      MEDICAL HISTORY: Past Medical History:  Diagnosis Date   Acquired solitary kidney    left --  s/p  right nephroureterctomy 01/ 2018   Anxiety    no current problems   Depression    no current problems   Elevated PSA    Enlarged lymph node 11/2022   enlarged right paratracheal lymph node.   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, negative margins)   History of kidney stones    passed stones and also surgery to remove   History of unilateral nephrectomy    01/ 2018  right nephroureterectomy for renal pelvis mass (carcinoma in situ)   HLD (hyperlipidemia)    on crestor    Hyperplasia of prostate without lower urinary tract  symptoms (LUTS)    Lung cancer (HCC) 12/07/2022   Nephrolithiasis    bilateral non-obstructive   Pneumonia    x 1 - yrs ago   Pulmonary nodules 11/2022   2 right upper lobe pulmonary nodules   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HCC)    Renal cyst, acquired, right    right kidney removed   Wears glasses     ALLERGIES:  is allergic to lamisil [terbinafine] and ivp dye [iodinated contrast media].  MEDICATIONS:  Current Outpatient Medications  Medication Sig Dispense Refill   cholecalciferol  (VITAMIN D3) 25 MCG (1000 UNIT) tablet Take  1,000 Units by mouth daily.     diphenhydrAMINE  (BENADRYL ) 50 MG tablet Take 1 tablet 2 hours before the CT scan. 1 tablet 0   lidocaine -prilocaine  (EMLA ) cream Apply small amount cream to port a cath site 30-60 minutes prior to accessing port 30 g 0   Multiple Vitamins-Minerals (MULTIVITAMIN WITH MINERALS) tablet Take 1 tablet by mouth daily. Centrum     predniSONE  (DELTASONE ) 50 MG tablet 1 tablet p.o. 14 hours then 7 hours then 2 hours before the scan. Please take Benadryl  50 mg p.o. x 1 2 hours before the scan with the last dose of prednisone . 3 tablet 0   rosuvastatin  (CRESTOR ) 10 MG tablet TAKE 1 TABLET EACH DAY. (Patient taking differently: Take 20 mg by mouth daily.) 30 tablet 0   silver  sulfADIAZINE  (SILVADENE ) 1 % cream Apply 1 Application topically daily. 50 g 0   tamsulosin  (FLOMAX ) 0.4 MG CAPS capsule Take 1 capsule (0.4 mg total) by mouth daily. (Patient taking differently: Take 0.4 mg by mouth daily after breakfast.) 30 capsule 5   prochlorperazine  (COMPAZINE ) 10 MG tablet Take 10 mg by mouth every 6 (six) hours as needed for nausea or vomiting. (Patient not taking: Reported on 04/17/2024)     sucralfate  (CARAFATE ) 1 g tablet Take 1 tablet (1 g total) by mouth 4 (four) times daily -  with meals and at bedtime. Dissolve tablet in water  -swish and swallow. (Patient not taking: Reported on 04/17/2024) 30 tablet 2   No current facility-administered medications for this visit.   Facility-Administered Medications Ordered in Other Visits  Medication Dose Route Frequency Provider Last Rate Last Admin   0.9 %  sodium chloride  infusion   Intravenous Continuous Marlene Simas, MD 10 mL/hr at 04/17/24 1204 New Bag at 04/17/24 1204   CARBOplatin  (PARAPLATIN ) 390 mg in sodium chloride  0.9 % 100 mL chemo infusion  390 mg Intravenous Once Marlene Simas, MD       cemiplimab -rwlc (LIBTAYO ) 350 mg in sodium chloride  0.9 % 100 mL chemo infusion  350 mg Intravenous Once Mohamed, Mohamed, MD        dexamethasone  (DECADRON ) injection 10 mg  10 mg Intravenous Once Mohamed, Mohamed, MD       fosaprepitant  (EMEND ) 150 mg in sodium chloride  0.9 % 145 mL IVPB  150 mg Intravenous Once Mohamed, Mohamed, MD       gemcitabine  (GEMZAR ) 1,710 mg in sodium chloride  0.9 % 250 mL chemo infusion  1,000 mg/m2 (Treatment Plan Recorded) Intravenous Once Marlene Simas, MD        SURGICAL HISTORY:  Past Surgical History:  Procedure Laterality Date   APPENDECTOMY  child   BRONCHIAL BIOPSY  12/07/2022   Procedure: BRONCHIAL BIOPSIES;  Surgeon: Prudy Brownie, DO;  Location: [facility];  Service: Pulmonary;;   BRONCHIAL BRUSHINGS  12/07/2022   Procedure: BRONCHIAL BRUSHINGS;  Surgeon: Prudy Brownie, DO;  Location:  [facility];  Service: Pulmonary;;   BRONCHIAL NEEDLE ASPIRATION BIOPSY  12/07/2022   Procedure: BRONCHIAL NEEDLE ASPIRATION BIOPSIES;  Surgeon: Prudy Brownie, DO;  Location: [facility];  Service: Pulmonary;;   CATARACT EXTRACTION W/ INTRAOCULAR LENS  IMPLANT, BILATERAL  2017   COLONOSCOPY  08/15/2012   Tubular Adenoma, No high grade dysplasia or malignacy.   CYSTOSCOPY W/ RETROGRADES Right 09/25/2016   Procedure: CYSTOSCOPY, URETHRAL DILITATION  WITH RETROGRADE PYELOGRAM, BRUSH BIOPSIES OF RIGHT RENAL MASS;  Surgeon: Annamarie Kid, MD;  Location: [facility];  Service: Urology;  Laterality: Right;   CYSTOSCOPY W/ RETROGRADES Left 03/16/2017   Procedure: CYSTOSCOPY WITH RETROGRADE PYELOGRAM;  Surgeon: Andrez Banker, MD;  Location: [facility];  Service: Urology;  Laterality: Left;   CYSTOSCOPY W/ URETERAL STENT REMOVAL Right 09/25/2016   Procedure: CYSTOSCOPY WITH STENT REMOVAL;  Surgeon: Annamarie Kid, MD;  Location: [facility];  Service: Urology;  Laterality: Right;   CYSTOSCOPY WITH RETROGRADE PYELOGRAM, URETEROSCOPY AND STENT PLACEMENT Right 08/21/2016   Procedure: CYSTOSCOPY WITH RIGHT RETROGRADE PYELOGRAM, RIGHT FLEXIBLE  AND RIGID URETEROSCOPY, INSERTION DOUBLE J STENT RIGHT;  Surgeon: Annamarie Kid, MD;  Location: [facility];  Service: Urology;  Laterality: Right;   EXTRACORPOREAL SHOCK WAVE LITHOTRIPSY  2009   IR IMAGING GUIDED PORT INSERTION  07/09/2023   KNEE SURGERY Right 1980's   ROBOT ASSITED LAPAROSCOPIC NEPHROURETERECTOMY Right 11/15/2016   Procedure: RIGHT XI ROBOT ASSITED LAPAROSCOPIC NEPHROURETERECTOMY;  Surgeon: Andrez Banker, MD;  Location: [facility];  Service: Urology;  Laterality: Right;   TRANSURETHRAL RESECTION OF BLADDER TUMOR WITH MITOMYCIN -C Left 03/16/2017   Procedure: CYSTOSCOPY BIOPSIES OF BLADDER TUMOR WITH FULGURATION;  Surgeon: Andrez Banker, MD;  Location: [facility];  Service: Urology;  Laterality: Left;   URETEROSCOPY Right 09/25/2016   Procedure: RIGHT URETEROSCOPY;  Surgeon: Annamarie Kid, MD;  Location: [facility];  Service: Urology;  Laterality: Right;   VIDEO BRONCHOSCOPY WITH ENDOBRONCHIAL ULTRASOUND Right 12/07/2022   Procedure: VIDEO BRONCHOSCOPY WITH ENDOBRONCHIAL ULTRASOUND;  Surgeon: Prudy Brownie, DO;  Location: [facility];  Service: Pulmonary;  Laterality: Right;    REVIEW OF SYSTEMS:   Review of Systems  Constitutional: Positive for fatigue. Negative for appetite change, chills, fever and unexpected weight change.  HENT: Negative for mouth sores, nosebleeds, sore throat and trouble swallowing.   Eyes: Negative for eye problems and icterus.  Respiratory: Positive for dyspnea with climbing stairs. Negative for cough, hemoptysis, and wheezing.   Cardiovascular: Negative for chest pain and leg swelling.  Gastrointestinal: Negative for abdominal pain, constipation, diarrhea, nausea and vomiting.  Genitourinary: Negative for bladder incontinence, difficulty urinating, dysuria, frequency and hematuria.   Musculoskeletal: Negative for back pain, gait problem, neck pain and neck stiffness.  Skin: Negative for  itching and rash.  Neurological: Negative for dizziness, extremity weakness, gait problem, headaches, light-headedness and seizures.  Hematological: Negative for adenopathy. Does not bruise/bleed easily.  Psychiatric/Behavioral: Negative for confusion, depression and sleep disturbance. The patient is not nervous/anxious.     PHYSICAL EXAMINATION:  Blood pressure (!) 141/65, pulse 79, temperature 98.2 F (36.8 C), temperature source Temporal, resp. rate 15, weight 145 lb 12.8 oz (66.1 kg), SpO2 99%.  ECOG PERFORMANCE STATUS: 1  Physical Exam  Constitutional: Oriented to person, place, and time and well-developed, well-nourished, and in no distress.  HENT:  Head: Normocephalic and atraumatic.  Mouth/Throat: Oropharynx is clear and moist. No oropharyngeal exudate.  Eyes: Conjunctivae are normal. Right eye  exhibits no discharge. Left eye exhibits no discharge. No scleral icterus.  Neck: Normal range of motion. Neck supple.  Cardiovascular: Normal rate, regular rhythm, normal heart sounds and intact distal pulses.   Pulmonary/Chest: Effort normal and breath sounds normal. No respiratory distress. No wheezes. No rales.  Abdominal: Soft. Bowel sounds are normal. Exhibits no distension and no mass. There is no tenderness.  Musculoskeletal: Normal range of motion. Exhibits no edema.  Lymphadenopathy:    No cervical adenopathy.  Neurological: Alert and oriented to person, place, and time. Exhibits normal muscle tone. Gait normal. Coordination normal.  Skin: Skin is warm and dry. No rash noted. Not diaphoretic. No erythema. No pallor.  Psychiatric: Mood, memory and judgment normal.  Vitals reviewed.  LABORATORY DATA: Lab Results  Component Value Date   WBC 4.8 04/17/2024   HGB 10.7 (L) 04/17/2024   HCT 31.6 (L) 04/17/2024   MCV 82.1 04/17/2024   PLT 297 04/17/2024      Chemistry      Component Value Date/Time   NA 141 04/17/2024 1016   K 4.1 04/17/2024 1016   CL 109 04/17/2024 1016    CO2 27 04/17/2024 1016   BUN 20 04/17/2024 1016   CREATININE 1.07 04/17/2024 1016   CREATININE 0.85 12/10/2014 1550      Component Value Date/Time   CALCIUM  8.9 04/17/2024 1016   ALKPHOS 95 04/17/2024 1016   AST 29 04/17/2024 1016   ALT 35 04/17/2024 1016   BILITOT 0.4 04/17/2024 1016       RADIOGRAPHIC STUDIES:  No results found.   ASSESSMENT/PLAN:  This is a very pleasant 83 year old Caucasian male diagnosed with stage IIIb (T3, N2, M0) non-small cell lung cancer, squamous cell carcinoma. He presented with 2 separate right upper lobe lung nodules in addition to mediastinal lymphadenopathy. He was diagnosed in January 2024. His PDL1 expression is 1%.    The patient completed a course of concurrent chemoradiation with weekly carboplatin  for AUC of 2 and paclitaxel  45 Mg/M2.  Status post 6 cycles. Starting from cycle #4, the taxol  was switched to abraxane  due to an infusion reaction to taxol .  The patient's last chemotherapy was on 02/05/2023. He saw Dr. Jeryl Moris from radiation oncology.    He then was on consolidation immunotherapy with Imfinzi  1500 milligrams IV every 4 weeks.  He is status post 7 cycles. His last dose was on 08/27/23.    His CT scan from October 2024 showed parahilar scarring/fibrosis. Small nodules in the medial suprahilar right lung previously have become incorporated into this more confluent soft tissue density showing a masslike nodular character measuring 4.3 x 1.7 cm. While this masslike component may be related to confluent scarring, recurrent/residual disease is a concern per radiology.   Dr. Marguerita Shih recommended a PET scan to further evaluate. The scan showed intensely hypermetabolic solid 4.2 cm medial right upper lobe lung mass, compatible with persistent viable malignancy, with surrounding evolving post radiation change.     The patient saw Dr. Hermon Lor at [facility]. The patient was not a surgical candidate.   The patient underwent CT-guided pulsed field  electroporation of the right suprahilar mass under the care of Dr. Nowell Begun at [facility] on 02/12/2024.   Patient is currently undergoing palliative systemic chemotherapy and immunotherapy with carboplatin  for AUC of 5 on day 1, gemcitabine  1000 mg on days 1 and 8, and Libtayo  350 mg on day 1 IV every 3 weeks.  The patient is status post 2 cycles of treatment.  The patient did have some cytopenias but did not require any supportive care.   Labs were reviewed. His labs are acceptable for treatment today. We will see him back in 3 weeks for evaluation repeat blood work before undergoing cycle #4  I will arrange for a restaging CT scan of the chest prior to his next cycle of treatment.   Anemia due to chemotherapy Anemia secondary to chemotherapy contributing to dizziness and shortness of breath. Decreasing red blood cell count with cumulative chemotherapy doses. - Continue daily iron supplementation. - Monitor hemoglobin and red blood cell count with regular labs. - Encourage adequate hydration to manage dizziness.  Thyroid  nodule with indeterminate biopsy Thyroid  nodule with indeterminate biopsy malignancy. Patient prefers local management during ongoing chemotherapy. - Share previous biopsy results with [facility] to determine if re-biopsy is necessary. - Consider surgical consultation for thyroid  nodule removal if indicated. - Revisit the decision after completion of chemotherapy cycles.   The patient was advised to call immediately if he has any concerning symptoms in the interval. The patient voices understanding of current disease status and treatment options and is in agreement with the current care plan. All questions were answered. The patient knows to call the clinic with any problems, questions or concerns. We can certainly see the patient much sooner if necessary        Orders Placed This Encounter  Procedures   CT Chest W Contrast    Standing Status:   Future     Expected Date:   05/01/2024    Expiration Date:   04/17/2025    If indicated for the ordered procedure, I authorize the administration of contrast media per Radiology protocol:   Yes    Does the patient have a contrast media/X-ray dye allergy?:   No    Preferred imaging location?:   [facility]      The total time spent in the appointment was 20-29 minutes  Noralee Dutko L Conya Ellinwood, PA-C 04/17/24

## 2024-04-15 ENCOUNTER — Telehealth: Payer: Self-pay

## 2024-04-15 ENCOUNTER — Encounter: Payer: Self-pay | Admitting: Internal Medicine

## 2024-04-15 DIAGNOSIS — H5203 Hypermetropia, bilateral: Secondary | ICD-10-CM | POA: Diagnosis not present

## 2024-04-15 DIAGNOSIS — Z961 Presence of intraocular lens: Secondary | ICD-10-CM | POA: Diagnosis not present

## 2024-04-15 DIAGNOSIS — R7301 Impaired fasting glucose: Secondary | ICD-10-CM | POA: Diagnosis not present

## 2024-04-15 NOTE — Telephone Encounter (Signed)
 Spoke with patient this morning. He reports experiencing dizziness and feeling woozy when bending down, which resolves upon standing. Patient was asked about fluid intake; he stated that since starting his new treatment, he has not been drinking enough water  and has also experienced a change in appetite. Additionally, he reported increased shortness of breath when going up stairs, which has developed over the past 1-2 weeks.  Patient also expressed concern regarding his WBC count, noting that it was low during his last treatment. He inquired whether he should come in to check labs prior to his next treatment. Informed patient that I would relay his concerns to Dr. Marguerita Shih for review and follow up with him regarding any recommendations.

## 2024-04-16 ENCOUNTER — Encounter: Payer: Self-pay | Admitting: Internal Medicine

## 2024-04-16 MED FILL — Fosaprepitant Dimeglumine For IV Infusion 150 MG (Base Eq): INTRAVENOUS | Qty: 5 | Status: AC

## 2024-04-17 ENCOUNTER — Inpatient Hospital Stay: Attending: Internal Medicine

## 2024-04-17 ENCOUNTER — Encounter: Payer: Self-pay | Admitting: Internal Medicine

## 2024-04-17 ENCOUNTER — Encounter: Payer: Self-pay | Admitting: Physician Assistant

## 2024-04-17 ENCOUNTER — Inpatient Hospital Stay (HOSPITAL_BASED_OUTPATIENT_CLINIC_OR_DEPARTMENT_OTHER): Admitting: Physician Assistant

## 2024-04-17 ENCOUNTER — Inpatient Hospital Stay

## 2024-04-17 VITALS — BP 141/65 | HR 79 | Temp 98.2°F | Resp 15 | Wt 145.8 lb

## 2024-04-17 DIAGNOSIS — Z5112 Encounter for antineoplastic immunotherapy: Secondary | ICD-10-CM | POA: Insufficient documentation

## 2024-04-17 DIAGNOSIS — D6481 Anemia due to antineoplastic chemotherapy: Secondary | ICD-10-CM | POA: Diagnosis not present

## 2024-04-17 DIAGNOSIS — C3491 Malignant neoplasm of unspecified part of right bronchus or lung: Secondary | ICD-10-CM

## 2024-04-17 DIAGNOSIS — C3490 Malignant neoplasm of unspecified part of unspecified bronchus or lung: Secondary | ICD-10-CM

## 2024-04-17 DIAGNOSIS — Z95828 Presence of other vascular implants and grafts: Secondary | ICD-10-CM

## 2024-04-17 DIAGNOSIS — E041 Nontoxic single thyroid nodule: Secondary | ICD-10-CM | POA: Diagnosis not present

## 2024-04-17 DIAGNOSIS — C3411 Malignant neoplasm of upper lobe, right bronchus or lung: Secondary | ICD-10-CM | POA: Diagnosis present

## 2024-04-17 DIAGNOSIS — Z5111 Encounter for antineoplastic chemotherapy: Secondary | ICD-10-CM

## 2024-04-17 DIAGNOSIS — C679 Malignant neoplasm of bladder, unspecified: Secondary | ICD-10-CM

## 2024-04-17 LAB — CBC WITH DIFFERENTIAL (CANCER CENTER ONLY)
Abs Immature Granulocytes: 0.06 10*3/uL (ref 0.00–0.07)
Basophils Absolute: 0 10*3/uL (ref 0.0–0.1)
Basophils Relative: 0 %
Eosinophils Absolute: 0.2 10*3/uL (ref 0.0–0.5)
Eosinophils Relative: 4 %
HCT: 31.6 % — ABNORMAL LOW (ref 39.0–52.0)
Hemoglobin: 10.7 g/dL — ABNORMAL LOW (ref 13.0–17.0)
Immature Granulocytes: 1 %
Lymphocytes Relative: 10 %
Lymphs Abs: 0.5 10*3/uL — ABNORMAL LOW (ref 0.7–4.0)
MCH: 27.8 pg (ref 26.0–34.0)
MCHC: 33.9 g/dL (ref 30.0–36.0)
MCV: 82.1 fL (ref 80.0–100.0)
Monocytes Absolute: 0.9 10*3/uL (ref 0.1–1.0)
Monocytes Relative: 18 %
Neutro Abs: 3.2 10*3/uL (ref 1.7–7.7)
Neutrophils Relative %: 67 %
Platelet Count: 297 10*3/uL (ref 150–400)
RBC: 3.85 MIL/uL — ABNORMAL LOW (ref 4.22–5.81)
RDW: 15.7 % — ABNORMAL HIGH (ref 11.5–15.5)
WBC Count: 4.8 10*3/uL (ref 4.0–10.5)
nRBC: 0 % (ref 0.0–0.2)

## 2024-04-17 LAB — CMP (CANCER CENTER ONLY)
ALT: 35 U/L (ref 0–44)
AST: 29 U/L (ref 15–41)
Albumin: 3.9 g/dL (ref 3.5–5.0)
Alkaline Phosphatase: 95 U/L (ref 38–126)
Anion gap: 5 (ref 5–15)
BUN: 20 mg/dL (ref 8–23)
CO2: 27 mmol/L (ref 22–32)
Calcium: 8.9 mg/dL (ref 8.9–10.3)
Chloride: 109 mmol/L (ref 98–111)
Creatinine: 1.07 mg/dL (ref 0.61–1.24)
GFR, Estimated: >60 mL/min (ref >60)
Glucose, Bld: 107 mg/dL — ABNORMAL HIGH (ref 70–99)
Potassium: 4.1 mmol/L (ref 3.5–5.1)
Sodium: 141 mmol/L (ref 135–145)
Total Bilirubin: 0.4 mg/dL (ref 0.0–1.2)
Total Protein: 6.9 g/dL (ref 6.5–8.1)

## 2024-04-17 MED ORDER — SODIUM CHLORIDE 0.9 % IV SOLN
350.0000 mg | Freq: Once | INTRAVENOUS | Status: AC
  Administered 2024-04-17: 350 mg via INTRAVENOUS
  Filled 2024-04-17: qty 7

## 2024-04-17 MED ORDER — HEPARIN SOD (PORK) LOCK FLUSH 100 UNIT/ML IV SOLN
500.0000 [IU] | Freq: Once | INTRAVENOUS | Status: AC
  Administered 2024-04-17: 500 [IU]

## 2024-04-17 MED ORDER — SODIUM CHLORIDE 0.9 % IV SOLN: INTRAVENOUS | Status: DC

## 2024-04-17 MED ORDER — PALONOSETRON HCL INJECTION 0.25 MG/5ML
0.2500 mg | Freq: Once | INTRAVENOUS | Status: AC
  Administered 2024-04-17: 0.25 mg via INTRAVENOUS
  Filled 2024-04-17: qty 5

## 2024-04-17 MED ORDER — SODIUM CHLORIDE 0.9 % IV SOLN
1000.0000 mg/m2 | Freq: Once | INTRAVENOUS | Status: AC
  Administered 2024-04-17: 1710 mg via INTRAVENOUS
  Filled 2024-04-17: qty 44.97

## 2024-04-17 MED ORDER — SODIUM CHLORIDE 0.9 % IV SOLN
150.0000 mg | Freq: Once | INTRAVENOUS | Status: AC
  Administered 2024-04-17: 150 mg via INTRAVENOUS
  Filled 2024-04-17: qty 150

## 2024-04-17 MED ORDER — SODIUM CHLORIDE 0.9 % IV SOLN
390.0000 mg | Freq: Once | INTRAVENOUS | Status: AC
  Administered 2024-04-17: 390 mg via INTRAVENOUS
  Filled 2024-04-17: qty 38.22

## 2024-04-17 MED ORDER — DEXAMETHASONE SODIUM PHOSPHATE 10 MG/ML IJ SOLN
10.0000 mg | Freq: Once | INTRAMUSCULAR | Status: AC
  Administered 2024-04-17: 10 mg via INTRAVENOUS
  Filled 2024-04-17: qty 1

## 2024-04-17 MED ORDER — SODIUM CHLORIDE 0.9% FLUSH
10.0000 mL | Freq: Once | INTRAVENOUS | Status: AC
  Administered 2024-04-17: 10 mL

## 2024-04-17 NOTE — Patient Instructions (Signed)
 [facility] - A DEPT OF [facility]  Discharge Instructions: Thank you for choosing [facility] to provide your oncology and hematology care.   If you have a lab appointment with the Cancer Center, please go directly to the Cancer Center and check in at the registration area.   Wear comfortable clothing and clothing appropriate for easy access to any Portacath or PICC line.   We strive to give you quality time with your provider. You may need to reschedule your appointment if you arrive late (15 or more minutes).  Arriving late affects you and other patients whose appointments are after yours.  Also, if you miss three or more appointments without notifying the office, you may be dismissed from the clinic at the provider's discretion.      For prescription refill requests, have your pharmacy contact our office and allow 72 hours for refills to be completed.    Today you received the following chemotherapy and/or immunotherapy agents: Cemiplimab , Gemcitabine , Carboplatin       To help prevent nausea and vomiting after your treatment, we encourage you to take your nausea medication as directed.  BELOW ARE SYMPTOMS THAT SHOULD BE REPORTED IMMEDIATELY: *FEVER GREATER THAN 100.4 F (38 C) OR HIGHER *CHILLS OR SWEATING *NAUSEA AND VOMITING THAT IS NOT CONTROLLED WITH YOUR NAUSEA MEDICATION *UNUSUAL SHORTNESS OF BREATH *UNUSUAL BRUISING OR BLEEDING *URINARY PROBLEMS (pain or burning when urinating, or frequent urination) *BOWEL PROBLEMS (unusual diarrhea, constipation, pain near the anus) TENDERNESS IN MOUTH AND THROAT WITH OR WITHOUT PRESENCE OF ULCERS (sore throat, sores in mouth, or a toothache) UNUSUAL RASH, SWELLING OR PAIN  UNUSUAL VAGINAL DISCHARGE OR ITCHING   Items with * indicate a potential emergency and should be followed up as soon as possible or go to the Emergency Department if any problems should occur.  Please show the CHEMOTHERAPY  ALERT CARD or IMMUNOTHERAPY ALERT CARD at check-in to the Emergency Department and triage nurse.  Should you have questions after your visit or need to cancel or reschedule your appointment, please contact [facility] - A DEPT OF [facility]  Dept: [phone]  and follow the prompts.  Office hours are 8:00 a.m. to 4:30 p.m. Monday - Friday. Please note that voicemails left after 4:00 p.m. may not be returned until the following business day.  We are closed weekends and major holidays. You have access to a nurse at all times for urgent questions. Please call the main number to the clinic Dept: [phone] and follow the prompts.   For any non-urgent questions, you may also contact your provider using MyChart. We now offer e-Visits for anyone 16 and older to request care online for non-urgent symptoms. For details visit [email or web address].   Also download the MyChart app! Go to the app store, search "MyChart", open the app, select [place], and log in with your MyChart username and password.

## 2024-04-21 DIAGNOSIS — C649 Malignant neoplasm of unspecified kidney, except renal pelvis: Secondary | ICD-10-CM | POA: Diagnosis not present

## 2024-04-21 DIAGNOSIS — Z1339 Encounter for screening examination for other mental health and behavioral disorders: Secondary | ICD-10-CM | POA: Diagnosis not present

## 2024-04-21 DIAGNOSIS — N401 Enlarged prostate with lower urinary tract symptoms: Secondary | ICD-10-CM | POA: Diagnosis not present

## 2024-04-21 DIAGNOSIS — Z0000 Encounter for general adult medical examination without abnormal findings: Secondary | ICD-10-CM | POA: Diagnosis not present

## 2024-04-21 DIAGNOSIS — H9190 Unspecified hearing loss, unspecified ear: Secondary | ICD-10-CM | POA: Diagnosis not present

## 2024-04-21 DIAGNOSIS — E7800 Pure hypercholesterolemia, unspecified: Secondary | ICD-10-CM | POA: Diagnosis not present

## 2024-04-21 DIAGNOSIS — R7301 Impaired fasting glucose: Secondary | ICD-10-CM | POA: Diagnosis not present

## 2024-04-21 DIAGNOSIS — Z1331 Encounter for screening for depression: Secondary | ICD-10-CM | POA: Diagnosis not present

## 2024-04-21 DIAGNOSIS — C3491 Malignant neoplasm of unspecified part of right bronchus or lung: Secondary | ICD-10-CM | POA: Diagnosis not present

## 2024-04-21 DIAGNOSIS — R972 Elevated prostate specific antigen [PSA]: Secondary | ICD-10-CM | POA: Diagnosis not present

## 2024-04-21 DIAGNOSIS — Z87442 Personal history of urinary calculi: Secondary | ICD-10-CM | POA: Diagnosis not present

## 2024-04-21 DIAGNOSIS — M419 Scoliosis, unspecified: Secondary | ICD-10-CM | POA: Diagnosis not present

## 2024-04-21 DIAGNOSIS — N411 Chronic prostatitis: Secondary | ICD-10-CM | POA: Diagnosis not present

## 2024-04-21 DIAGNOSIS — C679 Malignant neoplasm of bladder, unspecified: Secondary | ICD-10-CM | POA: Diagnosis not present

## 2024-04-21 DIAGNOSIS — Z905 Acquired absence of kidney: Secondary | ICD-10-CM | POA: Diagnosis not present

## 2024-04-21 DIAGNOSIS — R82998 Other abnormal findings in urine: Secondary | ICD-10-CM | POA: Diagnosis not present

## 2024-04-21 DIAGNOSIS — M179 Osteoarthritis of knee, unspecified: Secondary | ICD-10-CM | POA: Diagnosis not present

## 2024-04-21 DIAGNOSIS — J302 Other seasonal allergic rhinitis: Secondary | ICD-10-CM | POA: Diagnosis not present

## 2024-04-23 ENCOUNTER — Other Ambulatory Visit: Payer: Self-pay | Admitting: Physician Assistant

## 2024-04-23 MED ORDER — PREDNISONE 50 MG PO TABS: ORAL_TABLET | ORAL | 0 refills | Status: DC

## 2024-04-24 ENCOUNTER — Inpatient Hospital Stay

## 2024-04-24 ENCOUNTER — Ambulatory Visit: Admitting: Internal Medicine

## 2024-04-24 ENCOUNTER — Other Ambulatory Visit

## 2024-04-24 VITALS — BP 119/67 | HR 75 | Temp 97.6°F | Resp 16

## 2024-04-24 DIAGNOSIS — Z95828 Presence of other vascular implants and grafts: Secondary | ICD-10-CM

## 2024-04-24 DIAGNOSIS — C679 Malignant neoplasm of bladder, unspecified: Secondary | ICD-10-CM

## 2024-04-24 DIAGNOSIS — D6481 Anemia due to antineoplastic chemotherapy: Secondary | ICD-10-CM | POA: Diagnosis not present

## 2024-04-24 DIAGNOSIS — Z5112 Encounter for antineoplastic immunotherapy: Secondary | ICD-10-CM | POA: Diagnosis not present

## 2024-04-24 DIAGNOSIS — C3411 Malignant neoplasm of upper lobe, right bronchus or lung: Secondary | ICD-10-CM | POA: Diagnosis not present

## 2024-04-24 DIAGNOSIS — E041 Nontoxic single thyroid nodule: Secondary | ICD-10-CM | POA: Diagnosis not present

## 2024-04-24 DIAGNOSIS — C3490 Malignant neoplasm of unspecified part of unspecified bronchus or lung: Secondary | ICD-10-CM

## 2024-04-24 DIAGNOSIS — C3491 Malignant neoplasm of unspecified part of right bronchus or lung: Secondary | ICD-10-CM

## 2024-04-24 DIAGNOSIS — Z5111 Encounter for antineoplastic chemotherapy: Secondary | ICD-10-CM | POA: Diagnosis not present

## 2024-04-24 LAB — CBC WITH DIFFERENTIAL (CANCER CENTER ONLY)
Abs Immature Granulocytes: 0.02 10*3/uL (ref 0.00–0.07)
Basophils Absolute: 0 10*3/uL (ref 0.0–0.1)
Basophils Relative: 1 %
Eosinophils Absolute: 0.1 10*3/uL (ref 0.0–0.5)
Eosinophils Relative: 3 %
HCT: 29.7 % — ABNORMAL LOW (ref 39.0–52.0)
Hemoglobin: 10.2 g/dL — ABNORMAL LOW (ref 13.0–17.0)
Immature Granulocytes: 1 %
Lymphocytes Relative: 15 %
Lymphs Abs: 0.3 10*3/uL — ABNORMAL LOW (ref 0.7–4.0)
MCH: 28 pg (ref 26.0–34.0)
MCHC: 34.3 g/dL (ref 30.0–36.0)
MCV: 81.6 fL (ref 80.0–100.0)
Monocytes Absolute: 0.2 10*3/uL (ref 0.1–1.0)
Monocytes Relative: 8 %
Neutro Abs: 1.6 10*3/uL — ABNORMAL LOW (ref 1.7–7.7)
Neutrophils Relative %: 72 %
Platelet Count: 202 10*3/uL (ref 150–400)
RBC: 3.64 MIL/uL — ABNORMAL LOW (ref 4.22–5.81)
RDW: 15.4 % (ref 11.5–15.5)
WBC Count: 2.2 10*3/uL — ABNORMAL LOW (ref 4.0–10.5)
nRBC: 0 % (ref 0.0–0.2)

## 2024-04-24 LAB — CMP (CANCER CENTER ONLY)
ALT: 40 U/L (ref 0–44)
AST: 41 U/L (ref 15–41)
Albumin: 3.8 g/dL (ref 3.5–5.0)
Alkaline Phosphatase: 87 U/L (ref 38–126)
Anion gap: 8 (ref 5–15)
BUN: 24 mg/dL — ABNORMAL HIGH (ref 8–23)
CO2: 25 mmol/L (ref 22–32)
Calcium: 9.1 mg/dL (ref 8.9–10.3)
Chloride: 107 mmol/L (ref 98–111)
Creatinine: 1.04 mg/dL (ref 0.61–1.24)
GFR, Estimated: >60 mL/min (ref >60)
Glucose, Bld: 123 mg/dL — ABNORMAL HIGH (ref 70–99)
Potassium: 4.3 mmol/L (ref 3.5–5.1)
Sodium: 140 mmol/L (ref 135–145)
Total Bilirubin: 0.5 mg/dL (ref 0.0–1.2)
Total Protein: 7.1 g/dL (ref 6.5–8.1)

## 2024-04-24 MED ORDER — SODIUM CHLORIDE 0.9 % IV SOLN: INTRAVENOUS | Status: DC

## 2024-04-24 MED ORDER — SODIUM CHLORIDE 0.9 % IV SOLN
1000.0000 mg/m2 | Freq: Once | INTRAVENOUS | Status: AC
  Administered 2024-04-24: 1710 mg via INTRAVENOUS
  Filled 2024-04-24: qty 44.97

## 2024-04-24 MED ORDER — PROCHLORPERAZINE MALEATE 10 MG PO TABS
10.0000 mg | ORAL_TABLET | Freq: Once | ORAL | Status: AC
  Administered 2024-04-24: 10 mg via ORAL
  Filled 2024-04-24: qty 1

## 2024-04-24 MED ORDER — SODIUM CHLORIDE 0.9% FLUSH: 10.0000 mL | INTRAVENOUS | Status: DC | PRN

## 2024-04-24 MED ORDER — SODIUM CHLORIDE 0.9% FLUSH
10.0000 mL | Freq: Once | INTRAVENOUS | Status: AC
  Administered 2024-04-24: 10 mL

## 2024-04-24 MED ORDER — HEPARIN SOD (PORK) LOCK FLUSH 100 UNIT/ML IV SOLN
500.0000 [IU] | Freq: Once | INTRAVENOUS | Status: DC | PRN
Start: 2024-04-24 — End: 2024-04-24

## 2024-04-24 NOTE — Patient Instructions (Signed)
 [facility] - A DEPT OF [facility]  Discharge Instructions: Thank you for choosing [facility] to provide your oncology and hematology care.   If you have a lab appointment with the Cancer Center, please go directly to the Cancer Center and check in at the registration area.   Wear comfortable clothing and clothing appropriate for easy access to any Portacath or PICC line.   We strive to give you quality time with your provider. You may need to reschedule your appointment if you arrive late (15 or more minutes).  Arriving late affects you and other patients whose appointments are after yours.  Also, if you miss three or more appointments without notifying the office, you may be dismissed from the clinic at the provider's discretion.      For prescription refill requests, have your pharmacy contact our office and allow 72 hours for refills to be completed.    Today you received the following chemotherapy and/or immunotherapy agents gemzar      To help prevent nausea and vomiting after your treatment, we encourage you to take your nausea medication as directed.  BELOW ARE SYMPTOMS THAT SHOULD BE REPORTED IMMEDIATELY: *FEVER GREATER THAN 100.4 F (38 C) OR HIGHER *CHILLS OR SWEATING *NAUSEA AND VOMITING THAT IS NOT CONTROLLED WITH YOUR NAUSEA MEDICATION *UNUSUAL SHORTNESS OF BREATH *UNUSUAL BRUISING OR BLEEDING *URINARY PROBLEMS (pain or burning when urinating, or frequent urination) *BOWEL PROBLEMS (unusual diarrhea, constipation, pain near the anus) TENDERNESS IN MOUTH AND THROAT WITH OR WITHOUT PRESENCE OF ULCERS (sore throat, sores in mouth, or a toothache) UNUSUAL RASH, SWELLING OR PAIN  UNUSUAL VAGINAL DISCHARGE OR ITCHING   Items with * indicate a potential emergency and should be followed up as soon as possible or go to the Emergency Department if any problems should occur.  Please show the CHEMOTHERAPY ALERT CARD or IMMUNOTHERAPY  ALERT CARD at check-in to the Emergency Department and triage nurse.  Should you have questions after your visit or need to cancel or reschedule your appointment, please contact [facility] - A DEPT OF [facility]  Dept: [phone]  and follow the prompts.  Office hours are 8:00 a.m. to 4:30 p.m. Monday - Friday. Please note that voicemails left after 4:00 p.m. may not be returned until the following business day.  We are closed weekends and major holidays. You have access to a nurse at all times for urgent questions. Please call the main number to the clinic Dept: [phone] and follow the prompts.   For any non-urgent questions, you may also contact your provider using MyChart. We now offer e-Visits for anyone 19 and older to request care online for non-urgent symptoms. For details visit [email or web address].   Also download the MyChart app! Go to the app store, search "MyChart", open the app, select [place], and log in with your MyChart username and password.

## 2024-04-30 ENCOUNTER — Ambulatory Visit (HOSPITAL_COMMUNITY)
Admission: RE | Admit: 2024-04-30 | Discharge: 2024-04-30 | Disposition: A | Discharge status: Home / Self Care | Source: Ambulatory Visit | Attending: Physician Assistant | Admitting: Physician Assistant

## 2024-04-30 DIAGNOSIS — C3490 Malignant neoplasm of unspecified part of unspecified bronchus or lung: Secondary | ICD-10-CM | POA: Diagnosis not present

## 2024-04-30 DIAGNOSIS — C3491 Malignant neoplasm of unspecified part of right bronchus or lung: Secondary | ICD-10-CM | POA: Diagnosis not present

## 2024-04-30 DIAGNOSIS — J479 Bronchiectasis, uncomplicated: Secondary | ICD-10-CM | POA: Diagnosis not present

## 2024-04-30 DIAGNOSIS — I700 Atherosclerosis of aorta: Secondary | ICD-10-CM | POA: Diagnosis not present

## 2024-04-30 MED ORDER — IOHEXOL 300 MG/ML  SOLN
75.0000 mL | Freq: Once | INTRAMUSCULAR | Status: AC | PRN
Start: 2024-04-30 — End: 2024-04-30
  Administered 2024-04-30: 75 mL via INTRAVENOUS

## 2024-04-30 MED ORDER — SODIUM CHLORIDE (PF) 0.9 % IJ SOLN
INTRAMUSCULAR | Status: AC
Start: 2024-04-30 — End: 2024-04-30
  Filled 2024-04-30: qty 50

## 2024-05-07 ENCOUNTER — Other Ambulatory Visit

## 2024-05-07 DIAGNOSIS — Z006 Encounter for examination for normal comparison and control in clinical research program: Secondary | ICD-10-CM

## 2024-05-07 MED FILL — Fosaprepitant Dimeglumine For IV Infusion 150 MG (Base Eq): INTRAVENOUS | Qty: 5 | Status: AC

## 2024-05-08 ENCOUNTER — Inpatient Hospital Stay (HOSPITAL_BASED_OUTPATIENT_CLINIC_OR_DEPARTMENT_OTHER): Admitting: Internal Medicine

## 2024-05-08 ENCOUNTER — Inpatient Hospital Stay

## 2024-05-08 VITALS — BP 128/70 | HR 70 | Temp 97.9°F | Resp 19 | Ht 65.0 in | Wt 143.0 lb

## 2024-05-08 DIAGNOSIS — C3490 Malignant neoplasm of unspecified part of unspecified bronchus or lung: Secondary | ICD-10-CM

## 2024-05-08 DIAGNOSIS — Z5112 Encounter for antineoplastic immunotherapy: Secondary | ICD-10-CM | POA: Diagnosis not present

## 2024-05-08 DIAGNOSIS — C3491 Malignant neoplasm of unspecified part of right bronchus or lung: Secondary | ICD-10-CM | POA: Diagnosis not present

## 2024-05-08 DIAGNOSIS — Z95828 Presence of other vascular implants and grafts: Secondary | ICD-10-CM

## 2024-05-08 DIAGNOSIS — C3411 Malignant neoplasm of upper lobe, right bronchus or lung: Secondary | ICD-10-CM | POA: Diagnosis not present

## 2024-05-08 DIAGNOSIS — C679 Malignant neoplasm of bladder, unspecified: Secondary | ICD-10-CM

## 2024-05-08 DIAGNOSIS — E041 Nontoxic single thyroid nodule: Secondary | ICD-10-CM | POA: Diagnosis not present

## 2024-05-08 DIAGNOSIS — D6481 Anemia due to antineoplastic chemotherapy: Secondary | ICD-10-CM | POA: Diagnosis not present

## 2024-05-08 DIAGNOSIS — Z5111 Encounter for antineoplastic chemotherapy: Secondary | ICD-10-CM | POA: Diagnosis not present

## 2024-05-08 LAB — CBC WITH DIFFERENTIAL (CANCER CENTER ONLY)
Abs Immature Granulocytes: 0.08 10*3/uL — ABNORMAL HIGH (ref 0.00–0.07)
Basophils Absolute: 0 10*3/uL (ref 0.0–0.1)
Basophils Relative: 0 %
Eosinophils Absolute: 0.1 10*3/uL (ref 0.0–0.5)
Eosinophils Relative: 3 %
HCT: 29.9 % — ABNORMAL LOW (ref 39.0–52.0)
Hemoglobin: 10.3 g/dL — ABNORMAL LOW (ref 13.0–17.0)
Immature Granulocytes: 2 %
Lymphocytes Relative: 10 %
Lymphs Abs: 0.4 10*3/uL — ABNORMAL LOW (ref 0.7–4.0)
MCH: 28.8 pg (ref 26.0–34.0)
MCHC: 34.4 g/dL (ref 30.0–36.0)
MCV: 83.5 fL (ref 80.0–100.0)
Monocytes Absolute: 0.8 10*3/uL (ref 0.1–1.0)
Monocytes Relative: 19 %
Neutro Abs: 3 10*3/uL (ref 1.7–7.7)
Neutrophils Relative %: 66 %
Platelet Count: 241 10*3/uL (ref 150–400)
RBC: 3.58 MIL/uL — ABNORMAL LOW (ref 4.22–5.81)
RDW: 18 % — ABNORMAL HIGH (ref 11.5–15.5)
WBC Count: 4.5 10*3/uL (ref 4.0–10.5)
nRBC: 0 % (ref 0.0–0.2)

## 2024-05-08 LAB — CMP (CANCER CENTER ONLY)
ALT: 27 U/L (ref 0–44)
AST: 28 U/L (ref 15–41)
Albumin: 4 g/dL (ref 3.5–5.0)
Alkaline Phosphatase: 74 U/L (ref 38–126)
Anion gap: 6 (ref 5–15)
BUN: 23 mg/dL (ref 8–23)
CO2: 26 mmol/L (ref 22–32)
Calcium: 8.9 mg/dL (ref 8.9–10.3)
Chloride: 108 mmol/L (ref 98–111)
Creatinine: 1.06 mg/dL (ref 0.61–1.24)
GFR, Estimated: >60 mL/min (ref >60)
Glucose, Bld: 137 mg/dL — ABNORMAL HIGH (ref 70–99)
Potassium: 3.8 mmol/L (ref 3.5–5.1)
Sodium: 140 mmol/L (ref 135–145)
Total Bilirubin: 0.5 mg/dL (ref 0.0–1.2)
Total Protein: 6.9 g/dL (ref 6.5–8.1)

## 2024-05-08 MED ORDER — SODIUM CHLORIDE 0.9 % IV SOLN
150.0000 mg | Freq: Once | INTRAVENOUS | Status: AC
  Administered 2024-05-08: 150 mg via INTRAVENOUS
  Filled 2024-05-08: qty 150

## 2024-05-08 MED ORDER — SODIUM CHLORIDE 0.9% FLUSH
10.0000 mL | Freq: Once | INTRAVENOUS | Status: AC
  Administered 2024-05-08: 10 mL

## 2024-05-08 MED ORDER — DEXAMETHASONE SODIUM PHOSPHATE 10 MG/ML IJ SOLN
10.0000 mg | Freq: Once | INTRAMUSCULAR | Status: AC
  Administered 2024-05-08: 10 mg via INTRAVENOUS
  Filled 2024-05-08: qty 1

## 2024-05-08 MED ORDER — HEPARIN SOD (PORK) LOCK FLUSH 100 UNIT/ML IV SOLN
500.0000 [IU] | Freq: Once | INTRAVENOUS | Status: AC | PRN
  Administered 2024-05-08: 500 [IU]

## 2024-05-08 MED ORDER — SODIUM CHLORIDE 0.9 % IV SOLN: INTRAVENOUS | Status: DC

## 2024-05-08 MED ORDER — PALONOSETRON HCL INJECTION 0.25 MG/5ML
0.2500 mg | Freq: Once | INTRAVENOUS | Status: AC
  Administered 2024-05-08: 0.25 mg via INTRAVENOUS
  Filled 2024-05-08: qty 5

## 2024-05-08 MED ORDER — SODIUM CHLORIDE 0.9% FLUSH
10.0000 mL | INTRAVENOUS | Status: DC | PRN
  Administered 2024-05-08: 10 mL

## 2024-05-08 MED ORDER — SODIUM CHLORIDE 0.9 % IV SOLN
379.5000 mg | Freq: Once | INTRAVENOUS | Status: AC
  Administered 2024-05-08: 380 mg via INTRAVENOUS
  Filled 2024-05-08: qty 38

## 2024-05-08 MED ORDER — SODIUM CHLORIDE 0.9 % IV SOLN
1000.0000 mg/m2 | Freq: Once | INTRAVENOUS | Status: AC
  Administered 2024-05-08: 1710 mg via INTRAVENOUS
  Filled 2024-05-08: qty 44.97

## 2024-05-08 MED ORDER — SODIUM CHLORIDE 0.9 % IV SOLN
350.0000 mg | Freq: Once | INTRAVENOUS | Status: AC
  Administered 2024-05-08: 350 mg via INTRAVENOUS
  Filled 2024-05-08: qty 7

## 2024-05-08 NOTE — Patient Instructions (Addendum)
 [facility] - A DEPT OF [facility]  Discharge Instructions: Thank you for choosing [facility] to provide your oncology and hematology care.   If you have a lab appointment with the Cancer Center, please go directly to the Cancer Center and check in at the registration area.   Wear comfortable clothing and clothing appropriate for easy access to any Portacath or PICC line.   We strive to give you quality time with your provider. You may need to reschedule your appointment if you arrive late (15 or more minutes).  Arriving late affects you and other patients whose appointments are after yours.  Also, if you miss three or more appointments without notifying the office, you may be dismissed from the clinic at the provider's discretion.      For prescription refill requests, have your pharmacy contact our office and allow 72 hours for refills to be completed.    Today you received the following chemotherapy and/or immunotherapy agents: Gemcitabine  (Gemzar ), Carboplatin , and Cemiplimab  (Libtayo ).   To help prevent nausea and vomiting after your treatment, we encourage you to take your nausea medication as directed.  BELOW ARE SYMPTOMS THAT SHOULD BE REPORTED IMMEDIATELY: *FEVER GREATER THAN 100.4 F (38 C) OR HIGHER *CHILLS OR SWEATING *NAUSEA AND VOMITING THAT IS NOT CONTROLLED WITH YOUR NAUSEA MEDICATION *UNUSUAL SHORTNESS OF BREATH *UNUSUAL BRUISING OR BLEEDING *URINARY PROBLEMS (pain or burning when urinating, or frequent urination) *BOWEL PROBLEMS (unusual diarrhea, constipation, pain near the anus) TENDERNESS IN MOUTH AND THROAT WITH OR WITHOUT PRESENCE OF ULCERS (sore throat, sores in mouth, or a toothache) UNUSUAL RASH, SWELLING OR PAIN  UNUSUAL VAGINAL DISCHARGE OR ITCHING   Items with * indicate a potential emergency and should be followed up as soon as possible or go to the Emergency Department if any problems should occur.  Please  show the CHEMOTHERAPY ALERT CARD or IMMUNOTHERAPY ALERT CARD at check-in to the Emergency Department and triage nurse.  Should you have questions after your visit or need to cancel or reschedule your appointment, please contact [facility]  Dept: [phone]  and follow the prompts.  Office hours are 8:00 a.m. to 4:30 p.m. Monday - Friday. Please note that voicemails left after 4:00 p.m. may not be returned until the following business day.  We are closed weekends and major holidays. You have access to a nurse at all times for urgent questions. Please call the main number to the clinic Dept: [phone] and follow the prompts.   For any non-urgent questions, you may also contact your provider using MyChart. We now offer e-Visits for anyone 19 and older to request care online for non-urgent symptoms. For details visit [email or web address].   Also download the MyChart app! Go to the app store, search MyChart, open the app, select [place], and log in with your MyChart username and password.

## 2024-05-08 NOTE — Progress Notes (Signed)
 [facility] [phone]   [phone]) [phone]  OFFICE PROGRESS NOTE  Mendez, Alan ORN, MD [address] [place] [address]  DIAGNOSIS:  1) Stage IIIb (T3, N2, M0) non-small cell lung cancer, squamous cell carcinoma presented with 2 separate right upper lobe lung nodule in addition to mediastinal lymphadenopathy diagnosed in January 2024. 2) left thyroid  nodule.  PDL1 expression: 1%   PRIOR THERAPY:  1) A course of concurrent chemoradiation with weekly carboplatin  for AUC of 2 and paclitaxel  45 Mg/M2. Last dose on 02/05/23. Status post 6 cycles. Taxol  changed to abraxane  starting from cycle #4 due to reaction to taxol   2) Consolidation immunotherapy with Imfinzi  1500 mg IV every 4 weeks.  First dose expected on 03/12/23.  Status post 7 cycles. 3) CT-guided pulsed field electroporation of the right suprahilar mass under the care of Dr. Sharron at [facility] on 02/12/2024.   CURRENT THERAPY: Systemic chemotherapy with Carboplatin  for AUC of 5 on day 1, gemcitabine  1000 Mg/M2 on days 1 and 8 as well as Libtayo  (Cempilimab) 350 Mg IV on day 1 every 3 weeks.  First dose March 06, 2024.  INTERVAL HISTORY: Alan Mendez 83 y.o. male returns to the clinic today for follow-up visit accompanied by his wife. Discussed the use of AI scribe software for clinical note transcription with the patient, who gave verbal consent to proceed.  History of Present Illness   Alan Mendez is an 83 year old male with stage 3B non-small cell lung cancer who presents for evaluation before starting cycle four of his treatment. He is accompanied by his wife.  He was diagnosed with stage 3B non-small cell lung cancer, squamous cell carcinoma, in January 2024, with a PD-L1 expression of one percent. He underwent concurrent chemoradiation with weekly carboplatin  and paclitaxel , followed by seven cycles of consolidation treatment with Imfinzi . He is currently receiving  treatment with Carboplatin , Gemcitabine  and Libtayo  every three weeks.  He feels 'a little run down' with tiredness and difficulty breathing, describing it as 'tired to breathe'. Despite these symptoms, he has not experienced significant illness. He notes some hair loss but not as severe as other chemotherapy patients and hopes his energy will return.  Previous CT scans show a reduction in tumor size from 6.0 by 2.9 cm to 1.6 by 2.4 cm. He stopped exercising a month ago due to feeling woozy and losing balance, attributing it to anemia. He is hopeful to resume exercise soon.  He is interested in regaining his lung capacity and is considering traveling once his energy returns, specifically planning a trip to [place] to visit his son.         MEDICAL HISTORY: Past Medical History:  Diagnosis Date   Acquired solitary kidney    left --  s/p  right nephroureterctomy 01/ 2018   Anxiety    no current problems   Depression    no current problems   Elevated PSA    Enlarged lymph node 11/2022   enlarged right paratracheal lymph node.   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, negative margins)   History of kidney stones    passed stones and also surgery to remove   History of unilateral nephrectomy  01/ 2018  right nephroureterectomy for renal pelvis mass (carcinoma in situ)   HLD (hyperlipidemia)    on crestor    Hyperplasia of prostate without lower urinary tract symptoms (LUTS)    Lung cancer (HCC) 12/07/2022   Nephrolithiasis    bilateral non-obstructive   Pneumonia    x 1 - yrs ago   Pulmonary nodules 11/2022   2 right upper lobe pulmonary nodules   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HCC)    Renal cyst, acquired, right    right kidney removed   Wears glasses      ALLERGIES:  is allergic to lamisil [terbinafine] and ivp dye [iodinated contrast media].  MEDICATIONS:  Current Outpatient Medications  Medication Sig Dispense Refill   cholecalciferol  (VITAMIN D3) 25 MCG (1000 UNIT) tablet Take 1,000 Units by mouth daily.     diphenhydrAMINE  (BENADRYL ) 50 MG tablet Take 1 tablet 2 hours before the CT scan. 1 tablet 0   lidocaine -prilocaine  (EMLA ) cream Apply small amount cream to port a cath site 30-60 minutes prior to accessing port 30 g 0   Multiple Vitamins-Minerals (MULTIVITAMIN WITH MINERALS) tablet Take 1 tablet by mouth daily. Centrum     predniSONE  (DELTASONE ) 50 MG tablet 1 tablet p.o. 13 hours then 7 hours then 2 hours before the scan. Please take Benadryl  50 mg p.o. x  2 hours before the scan with the last dose of prednisone . 3 tablet 0   prochlorperazine  (COMPAZINE ) 10 MG tablet Take 10 mg by mouth every 6 (six) hours as needed for nausea or vomiting. (Patient not taking: Reported on 04/17/2024)     rosuvastatin  (CRESTOR ) 10 MG tablet TAKE 1 TABLET EACH DAY. (Patient taking differently: Take 20 mg by mouth daily.) 30 tablet 0   silver  sulfADIAZINE  (SILVADENE ) 1 % cream Apply 1 Application topically daily. 50 g 0   sucralfate  (CARAFATE ) 1 g tablet Take 1 tablet (1 g total) by mouth 4 (four) times daily -  with meals and at bedtime. Dissolve tablet in water  -swish and swallow. (Patient not taking: Reported on 04/17/2024) 30 tablet 2   tamsulosin  (FLOMAX ) 0.4 MG CAPS capsule Take 1 capsule (0.4 mg total) by mouth daily. (Patient taking differently: Take 0.4 mg by mouth daily after breakfast.) 30 capsule 5   No current facility-administered medications for this visit.    SURGICAL HISTORY:  Past Surgical History:  Procedure Laterality Date   APPENDECTOMY  child   BRONCHIAL BIOPSY  12/07/2022   Procedure: BRONCHIAL BIOPSIES;  Surgeon: Brenna Adine CROME, DO;  Location: [facility];  Service: Pulmonary;;   BRONCHIAL BRUSHINGS  12/07/2022   Procedure:  BRONCHIAL BRUSHINGS;  Surgeon: Brenna Adine CROME, DO;  Location: [facility];  Service: Pulmonary;;   BRONCHIAL NEEDLE ASPIRATION BIOPSY  12/07/2022   Procedure: BRONCHIAL NEEDLE ASPIRATION BIOPSIES;  Surgeon: Brenna Adine CROME, DO;  Location: [facility];  Service: Pulmonary;;   CATARACT EXTRACTION W/ INTRAOCULAR LENS  IMPLANT, BILATERAL  2017   COLONOSCOPY  08/15/2012   Tubular Adenoma, No high grade dysplasia or malignacy.   CYSTOSCOPY W/ RETROGRADES Right 09/25/2016   Procedure: CYSTOSCOPY, URETHRAL DILITATION  WITH RETROGRADE PYELOGRAM, BRUSH BIOPSIES OF RIGHT RENAL MASS;  Surgeon: Arlena Gal, MD;  Location: [facility];  Service: Urology;  Laterality: Right;   CYSTOSCOPY W/ RETROGRADES Left 03/16/2017   Procedure: CYSTOSCOPY WITH RETROGRADE PYELOGRAM;  Surgeon: Cam Morene ORN, MD;  Location: [facility];  Service: Urology;  Laterality: Left;   CYSTOSCOPY W/ URETERAL STENT REMOVAL Right 09/25/2016  Procedure: CYSTOSCOPY WITH STENT REMOVAL;  Surgeon: Arlena Gal, MD;  Location: [facility];  Service: Urology;  Laterality: Right;   CYSTOSCOPY WITH RETROGRADE PYELOGRAM, URETEROSCOPY AND STENT PLACEMENT Right 08/21/2016   Procedure: CYSTOSCOPY WITH RIGHT RETROGRADE PYELOGRAM, RIGHT FLEXIBLE AND RIGID URETEROSCOPY, INSERTION DOUBLE J STENT RIGHT;  Surgeon: Arlena Gal, MD;  Location: [facility];  Service: Urology;  Laterality: Right;   EXTRACORPOREAL SHOCK WAVE LITHOTRIPSY  2009   IR IMAGING GUIDED PORT INSERTION  07/09/2023   KNEE SURGERY Right 1980's   ROBOT ASSITED LAPAROSCOPIC NEPHROURETERECTOMY Right 11/15/2016   Procedure: RIGHT XI ROBOT ASSITED LAPAROSCOPIC NEPHROURETERECTOMY;  Surgeon: Morene LELON Salines, MD;  Location: [facility];  Service: Urology;  Laterality: Right;   TRANSURETHRAL RESECTION OF BLADDER TUMOR WITH MITOMYCIN -C Left 03/16/2017   Procedure: CYSTOSCOPY BIOPSIES OF BLADDER TUMOR WITH FULGURATION;   Surgeon: Salines Morene LELON, MD;  Location: [facility];  Service: Urology;  Laterality: Left;   URETEROSCOPY Right 09/25/2016   Procedure: RIGHT URETEROSCOPY;  Surgeon: Arlena Gal, MD;  Location: [facility];  Service: Urology;  Laterality: Right;   VIDEO BRONCHOSCOPY WITH ENDOBRONCHIAL ULTRASOUND Right 12/07/2022   Procedure: VIDEO BRONCHOSCOPY WITH ENDOBRONCHIAL ULTRASOUND;  Surgeon: Brenna Adine CROME, DO;  Location: [facility];  Service: Pulmonary;  Laterality: Right;    REVIEW OF SYSTEMS:  Constitutional: positive for fatigue Eyes: negative Ears, nose, mouth, throat, and face: negative Respiratory: negative Cardiovascular: negative Gastrointestinal: negative Genitourinary:negative Integument/breast: negative Hematologic/lymphatic: negative Musculoskeletal:negative Neurological: negative Behavioral/Psych: negative Endocrine: negative Allergic/Immunologic: negative   PHYSICAL EXAMINATION: General appearance: alert, cooperative, fatigued, and no distress Head: Normocephalic, without obvious abnormality, atraumatic Neck: no adenopathy, no JVD, supple, symmetrical, trachea midline, and thyroid  not enlarged, symmetric, no tenderness/mass/nodules Lymph nodes: Cervical, supraclavicular, and axillary nodes normal. Resp: clear to auscultation bilaterally Back: symmetric, no curvature. ROM normal. No CVA tenderness. Cardio: regular rate and rhythm, S1, S2 normal, no murmur, click, rub or gallop GI: soft, non-tender; bowel sounds normal; no masses,  no organomegaly Extremities: extremities normal, atraumatic, no cyanosis or edema Neurologic: Alert and oriented X 3, normal strength and tone. Normal symmetric reflexes. Normal coordination and gait  ECOG PERFORMANCE STATUS: 1 - Symptomatic but completely ambulatory  Blood pressure 128/70, pulse 70, temperature 97.9 F (36.6 C), temperature source Temporal, resp. rate 19, height 5' 5 (1.651 m), weight  143 lb (64.9 kg), SpO2 99%.  LABORATORY DATA: Lab Results  Component Value Date   WBC 4.5 05/08/2024   HGB 10.3 (L) 05/08/2024   HCT 29.9 (L) 05/08/2024   MCV 83.5 05/08/2024   PLT 241 05/08/2024      Chemistry      Component Value Date/Time   NA 140 04/24/2024 1052   K 4.3 04/24/2024 1052   CL 107 04/24/2024 1052   CO2 25 04/24/2024 1052   BUN 24 (H) 04/24/2024 1052   CREATININE 1.04 04/24/2024 1052   CREATININE 0.85 12/10/2014 1550      Component Value Date/Time   CALCIUM  9.1 04/24/2024 1052   ALKPHOS 87 04/24/2024 1052   AST 41 04/24/2024 1052   ALT 40 04/24/2024 1052   BILITOT 0.5 04/24/2024 1052       RADIOGRAPHIC STUDIES: CT Chest W Contrast Result Date: 05/07/2024 CLINICAL DATA:  Non-small cell lung cancer, nonmetastatic, assess treatment response. * Tracking Code: BO * EXAM: CT CHEST WITH CONTRAST TECHNIQUE: Multidetector CT imaging of the chest was performed during intravenous contrast administration. RADIATION DOSE REDUCTION: This exam was performed according to  the departmental dose-optimization program which includes automated exposure control, adjustment of the mA and/or kV according to patient size and/or use of iterative reconstruction technique. CONTRAST:  75mL OMNIPAQUE  IOHEXOL  300 MG/ML  SOLN COMPARISON:  PET 09/11/2023 and CT chest 08/21/2023. FINDINGS: Cardiovascular: Right IJ Port-A-Cath terminates at the SVC RA junction. Atherosclerotic calcification of the aorta and aortic valve. Heart is at the upper limits of normal in size to minimally enlarged. No pericardial effusion. Left ventricle may be somewhat hypertrophied. Mediastinum/Nodes: Thoracic inlet lymph nodes are not enlarged by CT size criteria. 1.6 cm retrotracheal lymph node (2/40), decreased in size from 2.0 cm on 08/21/2023. Post treatment soft tissue thickening along the right hilum. No axillary adenopathy. Esophagus is grossly unremarkable. Lungs/Pleura: Post treatment consolidation, bronchiectasis  and surrounding architectural distortion in the right upper lobe and right perihilar region. Associated medial right upper lobe nodule measures approximately 1.6 x 2.4 cm (2/39), previously 1.7 x 4.3 cm. Findings appear more organized than on 08/21/2023. No pleural fluid. Increased narrowing of the right upper lobe bronchus. Upper Abdomen: Small hepatic cysts. Gallstones. Small low-attenuation lesion in the left kidney. No specific follow-up necessary. Visualized portions of the liver, gallbladder, adrenal glands, left kidney, spleen, pancreas, stomach and bowel are otherwise grossly unremarkable. No upper abdominal adenopathy. Musculoskeletal: Degenerative changes in the spine. No worrisome lytic or sclerotic lesions. Osteopenia. IMPRESSION: 1. Shrinking medial right upper lobe nodule and retrotracheal adenopathy with evolutionary changes of radiation therapy in the right upper lobe. 2. Increased narrowing of the right upper lobe bronchus, likely treatment related. 3. Cholelithiasis. 4.  Aortic atherosclerosis (ICD10-I70.0). Electronically Signed   By: Newell Eke M.D.   On: 05/07/2024 16:43    ASSESSMENT AND PLAN: This is a very pleasant 83 years old white male with Stage IIIb (T3, N2, M0) non-small cell lung cancer, squamous cell carcinoma presented with 2 separate right upper lobe lung nodule in addition to mediastinal lymphadenopathy diagnosed in January 2024. The patient has no evidence of metastatic disease to the brain or extrathoracic metastasis besides the 2 right upper lobe pulmonary nodule and mediastinal lymphadenopathy. I discussed his case with Dr. Kerrin, cardiothoracic surgery and he indicated that because of the location of the lymph node, the patient will not be a great candidate for surgical resection with negative margin. The patient underwent a course of concurrent chemoradiation with weekly carboplatin  for AUC of 2 and paclitaxel  45 Mg/M2.  Status post 6 cycles.  His treatment  with paclitaxel  was changed to Abraxane  starting from cycle #3 with secondary to hypersensitivity reaction. He tolerated this treatment fairly well with partial response. He underwent consolidation treatment with immunotherapy with Imfinzi  1500 Mg IV every 4 weeks status post 7 cycles.  This treatment was discontinued secondary to disease progression. The patient was seen by thoracic surgery at [facility] for consideration of surgical resection but he was not a surgical candidate. The patient underwent pulsed electric field therapy to the right suprahilar mass at [facility]. He is here today for evaluation before starting the first cycle of systemic chemotherapy with carboplatin  for AUC of 5 on day 1, gemcitabine  1000 Mg/M2 on days 1 and 8 in addition to Libtayo  (Cempilimab) 350 Mg IV on day 1 every 3 weeks.  First dose 03/06/2024.  Status post 3 cycles of treatment. He has been tolerating this treatment well except for fatigue. He had repeat CT scan of the chest performed recently.  I personally and independently reviewed the scan and discussed the result with  the patient and his wife.  His scan showed improvement of his disease. Assessment and Plan    Stage 3B non-small cell lung cancer, squamous cell carcinoma Diagnosed in January 2024 with PD-L1 expression of 1%. Underwent concurrent chemoradiation with carboplatin  and paclitaxel , followed by seven cycles of consolidation immunotherapy with Imfinzi . He is currently receiving treatment with Carboplatin , Gemcitabine  and Libtayo  every three weeks. Current imaging shows significant reduction in tumor size from 6.0 x 2.9 cm to 1.6 x 2.4 cm, indicating favorable treatment response. - Complete current cycle of combined chemotherapy and immunotherapy - Transition to maintenance therapy with Libtayo  every three weeks - Continue CT scans every nine weeks for monitoring - Coordinate with radiology to share imaging with referring physician  Anemia  due to chemotherapy Anemia likely secondary to chemotherapy, contributing to fatigue and reduced exercise tolerance. Anticipated improvement in hemoglobin levels and energy with cessation of chemotherapy. - Discontinue chemotherapy after current cycle - Encourage regular exercise to build stamina - Monitor hemoglobin levels   The patient was advised to call immediately if he has any concerning symptoms in the interval.  The patient voices understanding of current disease status and treatment options and is in agreement with the current care plan.  All questions were answered. The patient knows to call the clinic with any problems, questions or concerns. We can certainly see the patient much sooner if necessary.  The total time spent in the appointment was 30 minutes.  Disclaimer: This note was dictated with voice recognition software. Similar sounding words can inadvertently be transcribed and may not be corrected upon review.

## 2024-05-13 ENCOUNTER — Encounter: Payer: Self-pay | Admitting: Internal Medicine

## 2024-05-13 ENCOUNTER — Other Ambulatory Visit: Payer: Self-pay

## 2024-05-13 DIAGNOSIS — C3491 Malignant neoplasm of unspecified part of right bronchus or lung: Secondary | ICD-10-CM | POA: Diagnosis not present

## 2024-05-15 ENCOUNTER — Inpatient Hospital Stay

## 2024-05-15 ENCOUNTER — Encounter: Payer: Self-pay | Admitting: Internal Medicine

## 2024-05-15 ENCOUNTER — Inpatient Hospital Stay: Attending: Internal Medicine

## 2024-05-15 ENCOUNTER — Other Ambulatory Visit: Payer: Self-pay | Admitting: Internal Medicine

## 2024-05-15 VITALS — BP 124/66 | HR 62 | Temp 98.3°F | Resp 18 | Ht 65.0 in | Wt 142.0 lb

## 2024-05-15 DIAGNOSIS — C3490 Malignant neoplasm of unspecified part of unspecified bronchus or lung: Secondary | ICD-10-CM

## 2024-05-15 DIAGNOSIS — C3411 Malignant neoplasm of upper lobe, right bronchus or lung: Secondary | ICD-10-CM | POA: Insufficient documentation

## 2024-05-15 DIAGNOSIS — C3491 Malignant neoplasm of unspecified part of right bronchus or lung: Secondary | ICD-10-CM

## 2024-05-15 DIAGNOSIS — Z5111 Encounter for antineoplastic chemotherapy: Secondary | ICD-10-CM | POA: Insufficient documentation

## 2024-05-15 DIAGNOSIS — C679 Malignant neoplasm of bladder, unspecified: Secondary | ICD-10-CM

## 2024-05-15 DIAGNOSIS — Z95828 Presence of other vascular implants and grafts: Secondary | ICD-10-CM

## 2024-05-15 DIAGNOSIS — Z79620 Long term (current) use of immunosuppressive biologic: Secondary | ICD-10-CM | POA: Diagnosis not present

## 2024-05-15 LAB — CBC WITH DIFFERENTIAL (CANCER CENTER ONLY)
Abs Immature Granulocytes: 0.07 10*3/uL (ref 0.00–0.07)
Basophils Absolute: 0 10*3/uL (ref 0.0–0.1)
Basophils Relative: 1 %
Eosinophils Absolute: 0.1 10*3/uL (ref 0.0–0.5)
Eosinophils Relative: 3 %
HCT: 29.1 % — ABNORMAL LOW (ref 39.0–52.0)
Hemoglobin: 10 g/dL — ABNORMAL LOW (ref 13.0–17.0)
Immature Granulocytes: 4 %
Lymphocytes Relative: 16 %
Lymphs Abs: 0.3 10*3/uL — ABNORMAL LOW (ref 0.7–4.0)
MCH: 28.8 pg (ref 26.0–34.0)
MCHC: 34.4 g/dL (ref 30.0–36.0)
MCV: 83.9 fL (ref 80.0–100.0)
Monocytes Absolute: 0.2 10*3/uL (ref 0.1–1.0)
Monocytes Relative: 11 %
Neutro Abs: 1.3 10*3/uL — ABNORMAL LOW (ref 1.7–7.7)
Neutrophils Relative %: 65 %
Platelet Count: 175 10*3/uL (ref 150–400)
RBC: 3.47 MIL/uL — ABNORMAL LOW (ref 4.22–5.81)
RDW: 16.8 % — ABNORMAL HIGH (ref 11.5–15.5)
WBC Count: 2 10*3/uL — ABNORMAL LOW (ref 4.0–10.5)
nRBC: 0 % (ref 0.0–0.2)

## 2024-05-15 LAB — CMP (CANCER CENTER ONLY)
ALT: 32 U/L (ref 0–44)
AST: 33 U/L (ref 15–41)
Albumin: 3.9 g/dL (ref 3.5–5.0)
Alkaline Phosphatase: 88 U/L (ref 38–126)
Anion gap: 6 (ref 5–15)
BUN: 19 mg/dL (ref 8–23)
CO2: 27 mmol/L (ref 22–32)
Calcium: 8.9 mg/dL (ref 8.9–10.3)
Chloride: 107 mmol/L (ref 98–111)
Creatinine: 0.9 mg/dL (ref 0.61–1.24)
GFR, Estimated: >60 mL/min (ref >60)
Glucose, Bld: 99 mg/dL (ref 70–99)
Potassium: 4.1 mmol/L (ref 3.5–5.1)
Sodium: 140 mmol/L (ref 135–145)
Total Bilirubin: 0.4 mg/dL (ref 0.0–1.2)
Total Protein: 6.8 g/dL (ref 6.5–8.1)

## 2024-05-15 MED ORDER — PROCHLORPERAZINE MALEATE 10 MG PO TABS
10.0000 mg | ORAL_TABLET | Freq: Once | ORAL | Status: AC
  Administered 2024-05-15: 10 mg via ORAL
  Filled 2024-05-15: qty 1

## 2024-05-15 MED ORDER — SODIUM CHLORIDE 0.9 % IV SOLN
1000.0000 mg/m2 | Freq: Once | INTRAVENOUS | Status: AC
  Administered 2024-05-15: 1710 mg via INTRAVENOUS
  Filled 2024-05-15: qty 44.97

## 2024-05-15 MED ORDER — SODIUM CHLORIDE 0.9% FLUSH
10.0000 mL | Freq: Once | INTRAVENOUS | Status: AC
  Administered 2024-05-15: 10 mL

## 2024-05-15 MED ORDER — SODIUM CHLORIDE 0.9 % IV SOLN: INTRAVENOUS | Status: DC

## 2024-05-16 ENCOUNTER — Other Ambulatory Visit: Payer: Self-pay

## 2024-05-16 LAB — GENECONNECT MOLECULAR SCREEN: Genetic Analysis Overall Interpretation: NEGATIVE

## 2024-05-17 ENCOUNTER — Encounter: Payer: Self-pay | Admitting: Internal Medicine

## 2024-05-20 ENCOUNTER — Encounter: Payer: Self-pay | Admitting: Internal Medicine

## 2024-05-21 ENCOUNTER — Encounter: Payer: Self-pay | Admitting: Internal Medicine

## 2024-05-21 ENCOUNTER — Other Ambulatory Visit: Payer: Self-pay | Admitting: Physician Assistant

## 2024-05-21 ENCOUNTER — Telehealth: Payer: Self-pay

## 2024-05-21 DIAGNOSIS — C3491 Malignant neoplasm of unspecified part of right bronchus or lung: Secondary | ICD-10-CM

## 2024-05-21 NOTE — Telephone Encounter (Signed)
 Spoke with patient in response to [employer]. Patient called inquiring about his upcoming appointments. Informed patient that Cassie, PA approved deferring treatment by one week due to family coming in and starting the new treatment on July 24th. Advised patient that a scheduling message has been sent and someone will be contacting him with the updated appointments. Patient voiced understanding.

## 2024-05-22 ENCOUNTER — Telehealth: Payer: Self-pay | Admitting: Physician Assistant

## 2024-05-22 ENCOUNTER — Encounter: Payer: Self-pay | Admitting: Internal Medicine

## 2024-05-22 NOTE — Telephone Encounter (Signed)
 Rescheduled appointments with the patient per patient request.

## 2024-05-23 ENCOUNTER — Other Ambulatory Visit: Payer: Self-pay

## 2024-05-28 ENCOUNTER — Inpatient Hospital Stay

## 2024-05-28 ENCOUNTER — Inpatient Hospital Stay: Admitting: Physician Assistant

## 2024-06-03 NOTE — Progress Notes (Unsigned)
 [facility] OFFICE PROGRESS NOTE  [name], Charlie ORN, MD [address] [place] [address]  DIAGNOSIS: 1) Initially diagnosed as Stage IIIb (T3, N2, M0) non-small cell lung cancer, squamous cell carcinoma presented with 2 separate right upper lobe lung nodule in addition to mediastinal lymphadenopathy diagnosed in January 2024. 2) left thyroid  nodule.   PDL1 expression: 1%   PRIOR THERAPY: 1) A course of concurrent chemoradiation with weekly carboplatin  for AUC of 2 and paclitaxel  45 Mg/M2. Last dose on 02/05/23. Status post 6 cycles. Taxol  changed to abraxane  starting from cycle #4 due to reaction to taxol   2) Consolidation immunotherapy with Imfinzi  1500 mg IV every 4 weeks.  First dose expected on 03/12/23.  Status post 7 cycles. 3) CT-guided pulsed field electroporation of the right suprahilar mass under the care of Dr. Sharron at [facility] on 02/12/2024.  CURRENT THERAPY: Systemic chemotherapy with Carboplatin  for AUC of 5 on day 1, gemcitabine  1000 Mg/M2 on days 1 and 8 as well as Libtayo  (Cempilimab) 350 Mg IV on day 1 every 3 weeks. First dose March 06, 2024. Status post 4 cycles. Starting from cycle #5, he started maintenance immunotherapy with Libtayo .   INTERVAL HISTORY: Alan Mendez 83 y.o. male returns to the clinic today for a follow-up visit. The patient was last seen in the clinic by Dr. Sherrod on 05/08/2024. The patient completed 4 cycles of chemotherapy and immunotherapy. Starting from today he is expected to undergo his maintenance treatment with single agent immunotherapy with Libtayo .   He reports that he is currently undergoing immunotherapy following previous chemotherapy for cancer. He experiences dyspnea and reduced lung capacity. Overall his breathing is better than before though. His hemoglobin level has improved to 10.7 from 10. He experiences fatigue and is unable to run up stairs as before, but notes improvement in dizziness when  bending down, which has resolved.   He has no recent infections, fevers, chills, or unintentional weight loss. His appetite is affected by scarring in his throat from previous treatments, making eating less enjoyable and sometimes difficult, particularly with certain foods like bread. Overall it is mild and does not happen often.   No new breathing changes, cough, chest pain, or hemoptysis. He experienced a rash on his legs once, which has not recurred since undergoing cycle #1. He has not experienced nausea, vomiting, diarrhea, constipation, or headaches recently.  He is on a treatment schedule of immunotherapy every three weeks. He is considering returning to cycling and has been using a stationary bike. He is cautious about his activity level due to previous dizziness and fatigue.   He is here today for evaluation repeat blood work before undergoing cycle #5.  MEDICAL HISTORY: Past Medical History:  Diagnosis Date   Acquired solitary kidney    left --  s/p  right nephroureterctomy 01/ 2018   Anxiety    no current problems   Depression    no current problems   Elevated PSA    Enlarged lymph node 11/2022   enlarged right paratracheal lymph node.   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, negative margins)   History of kidney stones    passed stones and also surgery to remove   History of unilateral nephrectomy  01/ 2018  right nephroureterectomy for renal pelvis mass (carcinoma in situ)   HLD (hyperlipidemia)    on crestor    Hyperplasia of prostate without lower urinary tract symptoms (LUTS)    Lung cancer (HCC) 12/07/2022   Nephrolithiasis    bilateral non-obstructive   Pneumonia    x 1 - yrs ago   Pulmonary nodules 11/2022   2 right upper lobe pulmonary  nodules   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HCC)    Renal cyst, acquired, right    right kidney removed   Wears glasses     ALLERGIES:  is allergic to lamisil [terbinafine] and ivp dye [iodinated contrast media].  MEDICATIONS:  Current Outpatient Medications  Medication Sig Dispense Refill   cholecalciferol  (VITAMIN D3) 25 MCG (1000 UNIT) tablet Take 1,000 Units by mouth daily.     diphenhydrAMINE  (BENADRYL ) 50 MG tablet Take 1 tablet 2 hours before the CT scan. 1 tablet 0   lidocaine -prilocaine  (EMLA ) cream Apply small amount cream to port a cath site 30-60 minutes prior to accessing port 30 g 0   Multiple Vitamins-Minerals (MULTIVITAMIN WITH MINERALS) tablet Take 1 tablet by mouth daily. Centrum     predniSONE  (DELTASONE ) 50 MG tablet 1 tablet p.o. 13 hours then 7 hours then 2 hours before the scan. Please take Benadryl  50 mg p.o. x  2 hours before the scan with the last dose of prednisone . 3 tablet 0   prochlorperazine  (COMPAZINE ) 10 MG tablet Take 10 mg by mouth every 6 (six) hours as needed for nausea or vomiting. (Patient not taking: Reported on 04/17/2024)     rosuvastatin  (CRESTOR ) 10 MG tablet TAKE 1 TABLET EACH DAY. (Patient taking differently: Take 20 mg by mouth daily.) 30 tablet 0   silver  sulfADIAZINE  (SILVADENE ) 1 % cream Apply 1 Application topically daily. 50 g 0   sucralfate  (CARAFATE ) 1 g tablet Take 1 tablet (1 g total) by mouth 4 (four) times daily -  with meals and at bedtime. Dissolve tablet in water  -swish and swallow. (Patient not taking: Reported on 04/17/2024) 30 tablet 2   tamsulosin  (FLOMAX ) 0.4 MG CAPS capsule Take 1 capsule (0.4 mg total) by mouth daily. (Patient taking differently: Take 0.4 mg by mouth daily after breakfast.) 30 capsule 5   No current facility-administered medications for this visit.    SURGICAL HISTORY:  Past Surgical History:  Procedure Laterality Date   APPENDECTOMY  child   BRONCHIAL BIOPSY  12/07/2022   Procedure: BRONCHIAL  BIOPSIES;  Surgeon: Brenna Adine CROME, DO;  Location: [facility];  Service: Pulmonary;;   BRONCHIAL BRUSHINGS  12/07/2022   Procedure: BRONCHIAL BRUSHINGS;  Surgeon: Brenna Adine CROME, DO;  Location: [facility];  Service: Pulmonary;;   BRONCHIAL NEEDLE ASPIRATION BIOPSY  12/07/2022   Procedure: BRONCHIAL NEEDLE ASPIRATION BIOPSIES;  Surgeon: Brenna Adine CROME, DO;  Location: [facility];  Service: Pulmonary;;   CATARACT EXTRACTION W/ INTRAOCULAR LENS  IMPLANT, BILATERAL  2017   COLONOSCOPY  08/15/2012   Tubular Adenoma, No high grade dysplasia or malignacy.   CYSTOSCOPY W/ RETROGRADES Right 09/25/2016   Procedure: CYSTOSCOPY, URETHRAL DILITATION  WITH RETROGRADE PYELOGRAM, BRUSH BIOPSIES OF RIGHT RENAL MASS;  Surgeon: Arlena Gal, MD;  Location: [facility];  Service: Urology;  Laterality: Right;   CYSTOSCOPY W/ RETROGRADES Left 03/16/2017   Procedure: CYSTOSCOPY WITH RETROGRADE PYELOGRAM;  Surgeon: Cam Morene ORN, MD;  Location: [facility];  Service: Urology;  Laterality: Left;   CYSTOSCOPY W/ URETERAL STENT REMOVAL Right 09/25/2016  Procedure: CYSTOSCOPY WITH STENT REMOVAL;  Surgeon: Arlena Gal, MD;  Location: [facility];  Service: Urology;  Laterality: Right;   CYSTOSCOPY WITH RETROGRADE PYELOGRAM, URETEROSCOPY AND STENT PLACEMENT Right 08/21/2016   Procedure: CYSTOSCOPY WITH RIGHT RETROGRADE PYELOGRAM, RIGHT FLEXIBLE AND RIGID URETEROSCOPY, INSERTION DOUBLE J STENT RIGHT;  Surgeon: Arlena Gal, MD;  Location: [facility] ;  Service: Urology;  Laterality: Right;   EXTRACORPOREAL SHOCK WAVE LITHOTRIPSY  2009   IR IMAGING GUIDED PORT INSERTION  07/09/2023   KNEE SURGERY Right 1980's   ROBOT ASSITED LAPAROSCOPIC NEPHROURETERECTOMY Right 11/15/2016   Procedure: RIGHT XI ROBOT ASSITED LAPAROSCOPIC NEPHROURETERECTOMY;  Surgeon: Morene LELON Salines, MD;  Location: [facility];  Service: Urology;  Laterality: Right;    TRANSURETHRAL RESECTION OF BLADDER TUMOR WITH MITOMYCIN -C Left 03/16/2017   Procedure: CYSTOSCOPY BIOPSIES OF BLADDER TUMOR WITH FULGURATION;  Surgeon: Salines Morene LELON, MD;  Location: [facility];  Service: Urology;  Laterality: Left;   URETEROSCOPY Right 09/25/2016   Procedure: RIGHT URETEROSCOPY;  Surgeon: Arlena Gal, MD;  Location: [facility];  Service: Urology;  Laterality: Right;   VIDEO BRONCHOSCOPY WITH ENDOBRONCHIAL ULTRASOUND Right 12/07/2022   Procedure: VIDEO BRONCHOSCOPY WITH ENDOBRONCHIAL ULTRASOUND;  Surgeon: Brenna Adine CROME, DO;  Location: [facility];  Service: Pulmonary;  Laterality: Right;    REVIEW OF SYSTEMS:   Review of Systems  Constitutional: Positive for fatigue. Negative for appetite change, chills, fever and unexpected weight change.  HENT: Negative for mouth sores, nosebleeds, sore throat and trouble swallowing.   Eyes: Negative for eye problems and icterus.  Respiratory: Positive for dyspnea on exertion at time. Negative for cough, hemoptysis, and wheezing.   Cardiovascular: Negative for chest pain and leg swelling.  Gastrointestinal: Negative for abdominal pain, constipation, diarrhea, nausea and vomiting.  Genitourinary: Negative for bladder incontinence, difficulty urinating, dysuria, frequency and hematuria.   Musculoskeletal: Negative for back pain, gait problem, neck pain and neck stiffness.  Skin: Negative for itching and rash.  Neurological: Negative for dizziness, extremity weakness, gait problem, headaches, light-headedness and seizures.  Hematological: Negative for adenopathy. Does not bruise/bleed easily.  Psychiatric/Behavioral: Negative for confusion, depression and sleep disturbance. The patient is not nervous/anxious.     PHYSICAL EXAMINATION:  There were no vitals taken for this visit.  ECOG PERFORMANCE STATUS: 1  Physical Exam  Constitutional: Oriented to person, place, and time and well-developed,  well-nourished, and in no distress.  HENT:  Head: Normocephalic and atraumatic.  Mouth/Throat: Oropharynx is clear and moist. No oropharyngeal exudate.  Eyes: Conjunctivae are normal. Right eye exhibits no discharge. Left eye exhibits no discharge. No scleral icterus.  Neck: Normal range of motion. Neck supple.  Cardiovascular: Normal rate, regular rhythm, normal heart sounds and intact distal pulses.   Pulmonary/Chest: Effort normal and breath sounds normal. No respiratory distress. No wheezes. No rales.  Abdominal: Soft. Bowel sounds are normal. Exhibits no distension and no mass. There is no tenderness.  Musculoskeletal: Normal range of motion. Exhibits no edema.  Lymphadenopathy:    No cervical adenopathy.  Neurological: Alert and oriented to person, place, and time. Exhibits normal muscle tone. Gait normal. Coordination normal.  Skin: Skin is warm and dry. No rash noted. Not diaphoretic. No erythema. No pallor.  Psychiatric: Mood, memory and judgment normal.  Vitals reviewed.  LABORATORY DATA: Lab Results  Component Value Date   WBC 2.0 (L) 05/15/2024   HGB 10.0 (L) 05/15/2024   HCT 29.1 (L) 05/15/2024   MCV 83.9 05/15/2024  PLT 175 05/15/2024      Chemistry      Component Value Date/Time   NA 140 05/15/2024 0828   K 4.1 05/15/2024 0828   CL 107 05/15/2024 0828   CO2 27 05/15/2024 0828   BUN 19 05/15/2024 0828   CREATININE 0.90 05/15/2024 0828   CREATININE 0.85 12/10/2014 1550      Component Value Date/Time   CALCIUM  8.9 05/15/2024 0828   ALKPHOS 88 05/15/2024 0828   AST 33 05/15/2024 0828   ALT 32 05/15/2024 0828   BILITOT 0.4 05/15/2024 0828       RADIOGRAPHIC STUDIES:  No results found.   ASSESSMENT/PLAN:  This is a very pleasant 83 year old Caucasian male diagnosed with stage IIIb (T3, N2, M0) non-small cell lung cancer, squamous cell carcinoma. He presented with 2 separate right upper lobe lung nodules in addition to mediastinal lymphadenopathy. He was  diagnosed in January 2024. His PDL1 expression is 1%.    The patient completed a course of concurrent chemoradiation with weekly carboplatin  for AUC of 2 and paclitaxel  45 Mg/M2.  Status post 6 cycles. Starting from cycle #4, the taxol  was switched to abraxane  due to an infusion reaction to taxol .  The patient's last chemotherapy was on 02/05/2023. He saw Dr. Dewey from radiation oncology.    He then was on consolidation immunotherapy with Imfinzi  1500 milligrams IV every 4 weeks.  He is status post 7 cycles. His last dose was on 08/27/23.    His CT scan from October 2024 showed parahilar scarring/fibrosis. Small nodules in the medial suprahilar right lung previously have become incorporated into this more confluent soft tissue density showing a masslike nodular character measuring 4.3 x 1.7 cm. While this masslike component may be related to confluent scarring, recurrent/residual disease is a concern per radiology.   Dr. Sherrod recommended a PET scan to further evaluate. The scan showed intensely hypermetabolic solid 4.2 cm medial right upper lobe lung mass, compatible with persistent viable malignancy, with surrounding evolving post radiation change.     The patient saw Dr. Starlin at [facility]. The patient was not a surgical candidate.    The patient underwent CT-guided pulsed field electroporation of the right suprahilar mass under the care of Dr. Sharron at [facility] on 02/12/2024.   The patient is currently undergoing palliative systemic chemotherapy and immunotherapy with carboplatin  for AUC of 5 on day 1, gemcitabine  1000 mg on days 1 and 8, and Libtayo  350 mg on day 1 IV every 3 weeks.  The patient is status post 4 cycles of treatment.   Starting from cycle #5 which started on 06/05/2024  Labs were reviewed. Recommend he proceed with cycle #5 today as scheduled.   We will see him back for repeat blood work and a follow-up visit in 3 weeks before undergoing cycle #6.  - Monitor  thyroid  function for immunotherapy-related dysfunction. - Increase dairy intake for low calcium  levels.  The patient was advised to call immediately if he has any concerning symptoms in the interval. The patient voices understanding of current disease status and treatment options and is in agreement with the current care plan. All questions were answered. The patient knows to call the clinic with any problems, questions or concerns. We can certainly see the patient much sooner if necessary    No orders of the defined types were placed in this encounter.    The total time spent in the appointment was 20-29 minutes  Ardell Makarewicz L Delora Gravatt, PA-C 06/03/24

## 2024-06-05 ENCOUNTER — Inpatient Hospital Stay

## 2024-06-05 ENCOUNTER — Other Ambulatory Visit

## 2024-06-05 ENCOUNTER — Ambulatory Visit

## 2024-06-05 ENCOUNTER — Inpatient Hospital Stay (HOSPITAL_BASED_OUTPATIENT_CLINIC_OR_DEPARTMENT_OTHER): Admitting: Physician Assistant

## 2024-06-05 VITALS — BP 139/77 | HR 40 | Temp 97.6°F | Resp 19 | Wt 142.7 lb

## 2024-06-05 VITALS — HR 75

## 2024-06-05 DIAGNOSIS — C3490 Malignant neoplasm of unspecified part of unspecified bronchus or lung: Secondary | ICD-10-CM | POA: Diagnosis not present

## 2024-06-05 DIAGNOSIS — C679 Malignant neoplasm of bladder, unspecified: Secondary | ICD-10-CM

## 2024-06-05 DIAGNOSIS — Z5112 Encounter for antineoplastic immunotherapy: Secondary | ICD-10-CM | POA: Diagnosis not present

## 2024-06-05 DIAGNOSIS — Z5111 Encounter for antineoplastic chemotherapy: Secondary | ICD-10-CM | POA: Diagnosis not present

## 2024-06-05 DIAGNOSIS — C3411 Malignant neoplasm of upper lobe, right bronchus or lung: Secondary | ICD-10-CM | POA: Diagnosis not present

## 2024-06-05 DIAGNOSIS — Z95828 Presence of other vascular implants and grafts: Secondary | ICD-10-CM

## 2024-06-05 DIAGNOSIS — Z79620 Long term (current) use of immunosuppressive biologic: Secondary | ICD-10-CM | POA: Diagnosis not present

## 2024-06-05 DIAGNOSIS — C3491 Malignant neoplasm of unspecified part of right bronchus or lung: Secondary | ICD-10-CM

## 2024-06-05 LAB — CMP (CANCER CENTER ONLY)
ALT: 17 U/L (ref 0–44)
AST: 27 U/L (ref 15–41)
Albumin: 4 g/dL (ref 3.5–5.0)
Alkaline Phosphatase: 76 U/L (ref 38–126)
Anion gap: 6 (ref 5–15)
BUN: 22 mg/dL (ref 8–23)
CO2: 27 mmol/L (ref 22–32)
Calcium: 8.8 mg/dL — ABNORMAL LOW (ref 8.9–10.3)
Chloride: 107 mmol/L (ref 98–111)
Creatinine: 1.01 mg/dL (ref 0.61–1.24)
GFR, Estimated: >60 mL/min (ref >60)
Glucose, Bld: 91 mg/dL (ref 70–99)
Potassium: 4 mmol/L (ref 3.5–5.1)
Sodium: 140 mmol/L (ref 135–145)
Total Bilirubin: 0.7 mg/dL (ref 0.0–1.2)
Total Protein: 7 g/dL (ref 6.5–8.1)

## 2024-06-05 LAB — CBC WITH DIFFERENTIAL (CANCER CENTER ONLY)
Abs Immature Granulocytes: 0.03 K/uL (ref 0.00–0.07)
Basophils Absolute: 0 K/uL (ref 0.0–0.1)
Basophils Relative: 1 %
Eosinophils Absolute: 0.2 K/uL (ref 0.0–0.5)
Eosinophils Relative: 4 %
HCT: 31.2 % — ABNORMAL LOW (ref 39.0–52.0)
Hemoglobin: 10.7 g/dL — ABNORMAL LOW (ref 13.0–17.0)
Immature Granulocytes: 1 %
Lymphocytes Relative: 14 %
Lymphs Abs: 0.6 K/uL — ABNORMAL LOW (ref 0.7–4.0)
MCH: 30 pg (ref 26.0–34.0)
MCHC: 34.3 g/dL (ref 30.0–36.0)
MCV: 87.4 fL (ref 80.0–100.0)
Monocytes Absolute: 0.8 K/uL (ref 0.1–1.0)
Monocytes Relative: 19 %
Neutro Abs: 2.6 K/uL (ref 1.7–7.7)
Neutrophils Relative %: 61 %
Platelet Count: 271 K/uL (ref 150–400)
RBC: 3.57 MIL/uL — ABNORMAL LOW (ref 4.22–5.81)
RDW: 18 % — ABNORMAL HIGH (ref 11.5–15.5)
WBC Count: 4.2 K/uL (ref 4.0–10.5)
nRBC: 0 % (ref 0.0–0.2)

## 2024-06-05 LAB — TSH: TSH: 2.55 u[IU]/mL (ref 0.350–4.500)

## 2024-06-05 MED ORDER — SODIUM CHLORIDE 0.9% FLUSH: 10.0000 mL | INTRAVENOUS | Status: DC | PRN

## 2024-06-05 MED ORDER — SODIUM CHLORIDE 0.9 % IV SOLN
350.0000 mg | Freq: Once | INTRAVENOUS | Status: AC
  Administered 2024-06-05: 350 mg via INTRAVENOUS
  Filled 2024-06-05: qty 7

## 2024-06-05 MED ORDER — SODIUM CHLORIDE 0.9 % IV SOLN: INTRAVENOUS | Status: DC

## 2024-06-05 MED ORDER — SODIUM CHLORIDE 0.9% FLUSH
10.0000 mL | Freq: Once | INTRAVENOUS | Status: AC
Start: 2024-06-05 — End: 2024-06-05
  Administered 2024-06-05: 10 mL

## 2024-06-05 MED ORDER — HEPARIN SOD (PORK) LOCK FLUSH 100 UNIT/ML IV SOLN: 500.0000 [IU] | Freq: Once | INTRAVENOUS | Status: DC | PRN

## 2024-06-05 NOTE — Patient Instructions (Signed)
 [facility] - A DEPT OF [facility]  Discharge Instructions: Thank you for choosing [facility] to provide your oncology and hematology care.   If you have a lab appointment with the Cancer Center, please go directly to the Cancer Center and check in at the registration area.   Wear comfortable clothing and clothing appropriate for easy access to any Portacath or PICC line.   We strive to give you quality time with your provider. You may need to reschedule your appointment if you arrive late (15 or more minutes).  Arriving late affects you and other patients whose appointments are after yours.  Also, if you miss three or more appointments without notifying the office, you may be dismissed from the clinic at the provider's discretion.      For prescription refill requests, have your pharmacy contact our office and allow 72 hours for refills to be completed.    Today you received the following chemotherapy and/or immunotherapy agents libtayo      To help prevent nausea and vomiting after your treatment, we encourage you to take your nausea medication as directed.  BELOW ARE SYMPTOMS THAT SHOULD BE REPORTED IMMEDIATELY: *FEVER GREATER THAN 100.4 F (38 C) OR HIGHER *CHILLS OR SWEATING *NAUSEA AND VOMITING THAT IS NOT CONTROLLED WITH YOUR NAUSEA MEDICATION *UNUSUAL SHORTNESS OF BREATH *UNUSUAL BRUISING OR BLEEDING *URINARY PROBLEMS (pain or burning when urinating, or frequent urination) *BOWEL PROBLEMS (unusual diarrhea, constipation, pain near the anus) TENDERNESS IN MOUTH AND THROAT WITH OR WITHOUT PRESENCE OF ULCERS (sore throat, sores in mouth, or a toothache) UNUSUAL RASH, SWELLING OR PAIN  UNUSUAL VAGINAL DISCHARGE OR ITCHING   Items with * indicate a potential emergency and should be followed up as soon as possible or go to the Emergency Department if any problems should occur.  Please show the CHEMOTHERAPY ALERT CARD or IMMUNOTHERAPY  ALERT CARD at check-in to the Emergency Department and triage nurse.  Should you have questions after your visit or need to cancel or reschedule your appointment, please contact [facility] - A DEPT OF [facility]  Dept: [phone]  and follow the prompts.  Office hours are 8:00 a.m. to 4:30 p.m. Monday - Friday. Please note that voicemails left after 4:00 p.m. may not be returned until the following business day.  We are closed weekends and major holidays. You have access to a nurse at all times for urgent questions. Please call the main number to the clinic Dept: [phone] and follow the prompts.   For any non-urgent questions, you may also contact your provider using MyChart. We now offer e-Visits for anyone 104 and older to request care online for non-urgent symptoms. For details visit [email or web address].   Also download the MyChart app! Go to the app store, search "MyChart", open the app, select [place], and log in with your MyChart username and password.

## 2024-06-06 LAB — T4: T4, Total: 8.6 ug/dL (ref 4.5–12.0)

## 2024-06-07 ENCOUNTER — Other Ambulatory Visit: Payer: Self-pay

## 2024-06-08 ENCOUNTER — Other Ambulatory Visit: Payer: Self-pay

## 2024-06-12 DIAGNOSIS — L821 Other seborrheic keratosis: Secondary | ICD-10-CM | POA: Diagnosis not present

## 2024-06-12 DIAGNOSIS — D1801 Hemangioma of skin and subcutaneous tissue: Secondary | ICD-10-CM | POA: Diagnosis not present

## 2024-06-12 DIAGNOSIS — L814 Other melanin hyperpigmentation: Secondary | ICD-10-CM | POA: Diagnosis not present

## 2024-06-12 DIAGNOSIS — L570 Actinic keratosis: Secondary | ICD-10-CM | POA: Diagnosis not present

## 2024-06-12 DIAGNOSIS — D692 Other nonthrombocytopenic purpura: Secondary | ICD-10-CM | POA: Diagnosis not present

## 2024-06-12 DIAGNOSIS — Z85828 Personal history of other malignant neoplasm of skin: Secondary | ICD-10-CM | POA: Diagnosis not present

## 2024-06-12 DIAGNOSIS — D225 Melanocytic nevi of trunk: Secondary | ICD-10-CM | POA: Diagnosis not present

## 2024-06-25 NOTE — Progress Notes (Signed)
 Patient Care Team: [name], Charlie ORN, MD as PCP - General (Internal Medicine) Ernie Cough, MD as Consulting Physician (Orthopedic Surgery) Sherrod Sherrod, MD as Consulting Physician (Oncology) Dewey Rush, MD as Consulting Physician (Radiation Oncology)  Clinic Day:  06/26/2024  Referring physician: Sherrod Sherrod, MD  ASSESSMENT & PLAN:   Assessment & Plan: Primary squamous cell carcinoma of right lung (HCC) stage IIIb (T3, N2, M0) non-small cell lung cancer, squamous cell carcinoma. He presented with 2 separate right upper lobe lung nodules in addition to mediastinal lymphadenopathy. He was diagnosed in January 2024. His PDL1 expression is 1%.   The patient completed a course of concurrent chemoradiation with weekly carboplatin  for AUC of 2 and paclitaxel  45 Mg/M2.  Status post 6 cycles. Starting from cycle #4, the taxol  was switched to abraxane  due to an infusion reaction to taxol .  The patient's last chemotherapy was on 02/05/2023. He saw Dr. Dewey from radiation oncology.   He then was on consolidation immunotherapy with Imfinzi  1500 milligrams IV every 4 weeks.  He is status post 7 cycles. His last dose was on 08/27/23.   His CT scan from October 2024 showed parahilar scarring/fibrosis. Small nodules in the medial suprahilar right lung previously have become incorporated into this more confluent soft tissue density showing a masslike nodular character measuring 4.3 x 1.7 cm. While this masslike component may be related to confluent scarring, recurrent/residual disease is a concern per radiology.  Dr. Sherrod recommended a PET scan to further evaluate. The scan showed intensely hypermetabolic solid 4.2 cm medial right upper lobe lung mass, compatible with persistent viable malignancy, with surrounding evolving post radiation change.    The patient saw Dr. Starlin at [facility]. The patient was not a surgical candidate.   The patient underwent CT-guided pulsed field electroporation of the  right suprahilar mass under the care of Dr. Sharron at [facility] on 02/12/2024.  The patient is currently undergoing palliative systemic chemotherapy and immunotherapy with carboplatin  for AUC of 5 on day 1, gemcitabine  1000 mg on days 1 and 8, and Libtayo  350 mg on day 1 IV every 3 weeks.  The patient is status post 4 cycles of treatment.   Starting from cycle #5 which started on 06/05/2024   Dysphagia The patient has had progressive dysphagia since completing radiation treatment.  He states that liquids and soft solids are okay.  Eating breads and meats are more difficult.  Has caused him to gag a few times.  He states seeing GI for endoscopy has been mentioned in the past.  He is not quite ready for endoscopy but would like to consider that in the near future.  States he will likely ask for referral in September.  Anemia Mild and improved anemia with Hgb 11.3 and HCT 33.4.  Will continue to monitor with each visit.  Plan Labs reviewed. - Mild and stable anemia. - Potassium is 3.4 today.  Will recommend OTC potassium supplementation daily. Consider referral for endoscopy in the near future, possibly September. Labs and patient presentation are appropriate for treatment today. Proceed with immunotherapy Libtayo  today. Labs/labs, follow-up, and treatment 07/17/2024 as scheduled.   The patient understands the plans discussed today and is in agreement with them.  He knows to contact our office if he develops concerns prior to his next appointment.  I provided 25 minutes of face-to-face time during this encounter and > 50% was spent counseling as documented under my assessment and plan.    Powell FORBES Lessen, NP  [facility] - A DEPT OF [facility] [address] [place] [address] Dept: [phone] Dept Fax: [phone]   No orders of the defined types were placed in this encounter.     CHIEF COMPLAINT:   CC: Squamous cell carcinoma of the lung  Current Treatment: Maintenance immunotherapy Libtayo  every 3 weeks  INTERVAL HISTORY:  Alan Mendez is here today for repeat clinical assessment.  He last saw [place], [place], on 06/05/2024.  He reports continued and slightly worsening dysphagia.  Having difficulty with breads and meats in particular.  He does report improved energy.  He has started riding his bike again.  Breathing is improving.  He denies chest pain, chest pressure, or shortness of breath. He denies headaches or visual disturbances. He denies abdominal pain, nausea, vomiting, or changes in bowel or bladder habits.  He denies fevers or chills. He denies pain. His appetite is good. His weight has increased 2 pounds over last 3 weeks.  I have reviewed the past medical history, past surgical history, social history and family history with the patient and they are unchanged from previous note.  ALLERGIES:  is allergic to lamisil [terbinafine] and ivp dye [iodinated contrast media].  MEDICATIONS:  Current Outpatient Medications  Medication Sig Dispense Refill   cholecalciferol  (VITAMIN D3) 25 MCG (1000 UNIT) tablet Take 1,000 Units by mouth daily.     diphenhydrAMINE  (BENADRYL ) 50 MG tablet Take 1 tablet 2 hours before the CT scan. 1 tablet 0   lidocaine -prilocaine  (EMLA ) cream Apply small amount cream to port a cath site 30-60 minutes prior to accessing port 30 g 0   Multiple Vitamins-Minerals (MULTIVITAMIN WITH MINERALS) tablet Take 1 tablet by mouth daily. Centrum     predniSONE  (DELTASONE ) 50 MG tablet 1 tablet p.o. 13 hours then 7 hours then 2 hours before the scan. Please take Benadryl  50 mg p.o. x  2 hours before the scan with the last dose of prednisone . 3 tablet 0   prochlorperazine  (COMPAZINE ) 10 MG tablet Take 10 mg by mouth every 6 (six) hours as needed for nausea or vomiting.     rosuvastatin  (CRESTOR ) 10 MG tablet TAKE 1 TABLET EACH DAY. (Patient taking differently: Take 20 mg by mouth  daily.) 30 tablet 0   silver  sulfADIAZINE  (SILVADENE ) 1 % cream Apply 1 Application topically daily. 50 g 0   sucralfate  (CARAFATE ) 1 g tablet Take 1 tablet (1 g total) by mouth 4 (four) times daily -  with meals and at bedtime. Dissolve tablet in water  -swish and swallow. 30 tablet 2   tamsulosin  (FLOMAX ) 0.4 MG CAPS capsule Take 1 capsule (0.4 mg total) by mouth daily. (Patient taking differently: Take 0.4 mg by mouth daily after breakfast.) 30 capsule 5   No current facility-administered medications for this visit.   Facility-Administered Medications Ordered in Other Visits  Medication Dose Route Frequency Provider Last Rate Last Admin   0.9 %  sodium chloride  infusion   Intravenous Continuous Sherrod Sherrod, MD 10 mL/hr at 06/26/24 1210 New Bag at 06/26/24 1210   cemiplimab -rwlc (LIBTAYO ) 350 mg in sodium chloride  0.9 % 100 mL chemo infusion  350 mg Intravenous Once Mohamed, Mohamed, MD       sodium chloride  flush (NS) 0.9 % injection 10 mL  10 mL Intracatheter PRN Sherrod Sherrod, MD        HISTORY OF PRESENT ILLNESS:   Oncology History  Primary squamous cell carcinoma of right lung (HCC)  12/18/2022 Initial Diagnosis   Primary squamous cell carcinoma of right lung (HCC)   12/18/2022 Cancer Staging   Staging form: Lung, AJCC 8th Edition - Clinical: Stage IIIB (cT3, cN2, cM0) - Signed by Sherrod Sherrod, MD on 12/18/2022   01/02/2023 - 01/15/2023 Chemotherapy   Patient is on Treatment Plan : LUNG Carboplatin  + Paclitaxel  + XRT q7d     01/22/2023 - 02/05/2023 Chemotherapy   Patient is on Treatment Plan : LUNG Abraxane  (100)  + Carboplatin  (AUC 2) q7d     03/12/2023 - 08/27/2023 Chemotherapy   Patient is on Treatment Plan : LUNG NSCLC Durvalumab  (1500) q28d     03/06/2024 - 05/15/2024 Chemotherapy   Patient is on Treatment Plan : LUNG Carboplatin  D1 + Libtayo  350 mg + Gemcitabine  D1,8 q21d     06/05/2024 -  Chemotherapy   Patient is on Treatment Plan : LUNG NSCLC Cemiplimab  q21d          REVIEW OF SYSTEMS:   Constitutional: Denies fevers, chills or abnormal weight loss Eyes: Denies blurriness of vision Ears, nose, mouth, throat, and face: Denies mucositis or sore throat Respiratory: Denies cough, dyspnea or wheezes Cardiovascular: Denies palpitation, chest discomfort or lower extremity swelling Gastrointestinal:  Denies nausea, heartburn or change in bowel habits. Increased dysphagia.  Skin: Denies abnormal skin rashes Lymphatics: Denies new lymphadenopathy or easy bruising Neurological:Denies numbness, tingling or new weaknesses Behavioral/Psych: Mood is stable, no new changes  All other systems were reviewed with the patient and are negative.   VITALS:   Today's Vitals   06/26/24 1117 06/26/24 1124  BP: 130/80   Pulse: 78   Resp: 17   Temp: 97.8 F (36.6 C)   SpO2: 99%   Weight: 144 lb 6.4 oz (65.5 kg)   PainSc:  0-No pain   Body mass index is 24.03 kg/m.    Wt Readings from Last 3 Encounters:  06/26/24 144 lb 6.4 oz (65.5 kg)  06/05/24 142 lb 11.2 oz (64.7 kg)  05/15/24 142 lb (64.4 kg)    Body mass index is 24.03 kg/m.  Performance status (ECOG): 1 - Symptomatic but completely ambulatory  PHYSICAL EXAM:   GENERAL:alert, no distress and comfortable SKIN: skin color, texture, turgor are normal, no rashes or significant lesions EYES: normal, Conjunctiva are pink and non-injected, sclera clear OROPHARYNX:no exudate, no erythema and lips, buccal mucosa, and tongue normal  NECK: supple, thyroid  normal size, non-tender, without nodularity LYMPH:  no palpable lymphadenopathy in the cervical, axillary or inguinal LUNGS: clear to auscultation and percussion with normal breathing effort HEART: regular rate & rhythm and no murmurs and no lower extremity edema ABDOMEN:abdomen soft, non-tender and normal bowel sounds Musculoskeletal:no cyanosis of digits and no clubbing  NEURO: alert & oriented x 3 with fluent speech, no focal motor/sensory  deficits  LABORATORY DATA:  I have reviewed the data as listed    Component Value Date/Time   NA 142 06/26/2024 1059   K 3.4 (L) 06/26/2024 1059   CL 108 06/26/2024 1059   CO2 27 06/26/2024 1059   GLUCOSE 159 (H) 06/26/2024 1059   BUN 19 06/26/2024 1059   CREATININE 0.97 06/26/2024 1059   CREATININE 0.85 12/10/2014 1550   CALCIUM  8.7 (L) 06/26/2024 1059   PROT 6.7 06/26/2024 1059   ALBUMIN 3.9 06/26/2024 1059   AST 23 06/26/2024 1059   ALT 15 06/26/2024 1059   ALKPHOS 85 06/26/2024 1059   BILITOT 0.6 06/26/2024 1059   GFRNONAA >60 06/26/2024 1059   GFRNONAA 88  12/10/2014 1550   GFRAA 58 (L) 03/14/2019 2336   GFRAA >89 12/10/2014 1550    Lab Results  Component Value Date   WBC 7.0 06/26/2024   NEUTROABS 4.4 06/26/2024   HGB 11.3 (L) 06/26/2024   HCT 33.4 (L) 06/26/2024   MCV 89.3 06/26/2024   PLT 231 06/26/2024

## 2024-06-25 NOTE — Assessment & Plan Note (Addendum)
 stage IIIb (T3, N2, M0) non-small cell lung cancer, squamous cell carcinoma. He presented with 2 separate right upper lobe lung nodules in addition to mediastinal lymphadenopathy. He was diagnosed in January 2024. His PDL1 expression is 1%.   The patient completed a course of concurrent chemoradiation with weekly carboplatin  for AUC of 2 and paclitaxel  45 Mg/M2.  Status post 6 cycles. Starting from cycle #4, the taxol  was switched to abraxane  due to an infusion reaction to taxol .  The patient's last chemotherapy was on 02/05/2023. He saw Dr. Dewey from radiation oncology.   He then was on consolidation immunotherapy with Imfinzi  1500 milligrams IV every 4 weeks.  He is status post 7 cycles. His last dose was on 08/27/23.   His CT scan from October 2024 showed parahilar scarring/fibrosis. Small nodules in the medial suprahilar right lung previously have become incorporated into this more confluent soft tissue density showing a masslike nodular character measuring 4.3 x 1.7 cm. While this masslike component may be related to confluent scarring, recurrent/residual disease is a concern per radiology.  Dr. Sherrod recommended a PET scan to further evaluate. The scan showed intensely hypermetabolic solid 4.2 cm medial right upper lobe lung mass, compatible with persistent viable malignancy, with surrounding evolving post radiation change.    The patient saw Dr. Starlin at [facility]. The patient was not a surgical candidate.   The patient underwent CT-guided pulsed field electroporation of the right suprahilar mass under the care of Dr. Sharron at [facility] on 02/12/2024.  The patient is currently undergoing palliative systemic chemotherapy and immunotherapy with carboplatin  for AUC of 5 on day 1, gemcitabine  1000 mg on days 1 and 8, and Libtayo  350 mg on day 1 IV every 3 weeks.  The patient is status post 4 cycles of treatment.   Starting from cycle #5 which started on 06/05/2024

## 2024-06-26 ENCOUNTER — Inpatient Hospital Stay: Attending: Internal Medicine

## 2024-06-26 ENCOUNTER — Encounter: Payer: Self-pay | Admitting: Nurse Practitioner

## 2024-06-26 ENCOUNTER — Inpatient Hospital Stay (HOSPITAL_BASED_OUTPATIENT_CLINIC_OR_DEPARTMENT_OTHER): Admitting: Nurse Practitioner

## 2024-06-26 ENCOUNTER — Inpatient Hospital Stay

## 2024-06-26 VITALS — BP 130/80 | HR 78 | Temp 97.8°F | Resp 17 | Wt 144.4 lb

## 2024-06-26 VITALS — BP 124/68 | HR 60 | Temp 98.0°F | Resp 17

## 2024-06-26 DIAGNOSIS — C3411 Malignant neoplasm of upper lobe, right bronchus or lung: Secondary | ICD-10-CM | POA: Diagnosis present

## 2024-06-26 DIAGNOSIS — C3491 Malignant neoplasm of unspecified part of right bronchus or lung: Secondary | ICD-10-CM

## 2024-06-26 DIAGNOSIS — R1310 Dysphagia, unspecified: Secondary | ICD-10-CM | POA: Insufficient documentation

## 2024-06-26 DIAGNOSIS — D649 Anemia, unspecified: Secondary | ICD-10-CM | POA: Insufficient documentation

## 2024-06-26 DIAGNOSIS — Z95828 Presence of other vascular implants and grafts: Secondary | ICD-10-CM

## 2024-06-26 DIAGNOSIS — C3490 Malignant neoplasm of unspecified part of unspecified bronchus or lung: Secondary | ICD-10-CM

## 2024-06-26 DIAGNOSIS — C679 Malignant neoplasm of bladder, unspecified: Secondary | ICD-10-CM

## 2024-06-26 DIAGNOSIS — Z5112 Encounter for antineoplastic immunotherapy: Secondary | ICD-10-CM | POA: Insufficient documentation

## 2024-06-26 LAB — CMP (CANCER CENTER ONLY)
ALT: 15 U/L (ref 0–44)
AST: 23 U/L (ref 15–41)
Albumin: 3.9 g/dL (ref 3.5–5.0)
Alkaline Phosphatase: 85 U/L (ref 38–126)
Anion gap: 7 (ref 5–15)
BUN: 19 mg/dL (ref 8–23)
CO2: 27 mmol/L (ref 22–32)
Calcium: 8.7 mg/dL — ABNORMAL LOW (ref 8.9–10.3)
Chloride: 108 mmol/L (ref 98–111)
Creatinine: 0.97 mg/dL (ref 0.61–1.24)
GFR, Estimated: >60 mL/min (ref >60)
Glucose, Bld: 159 mg/dL — ABNORMAL HIGH (ref 70–99)
Potassium: 3.4 mmol/L — ABNORMAL LOW (ref 3.5–5.1)
Sodium: 142 mmol/L (ref 135–145)
Total Bilirubin: 0.6 mg/dL (ref 0.0–1.2)
Total Protein: 6.7 g/dL (ref 6.5–8.1)

## 2024-06-26 LAB — CBC WITH DIFFERENTIAL (CANCER CENTER ONLY)
Abs Immature Granulocytes: 0.04 K/uL (ref 0.00–0.07)
Basophils Absolute: 0.1 K/uL (ref 0.0–0.1)
Basophils Relative: 1 %
Eosinophils Absolute: 1.4 K/uL — ABNORMAL HIGH (ref 0.0–0.5)
Eosinophils Relative: 20 %
HCT: 33.4 % — ABNORMAL LOW (ref 39.0–52.0)
Hemoglobin: 11.3 g/dL — ABNORMAL LOW (ref 13.0–17.0)
Immature Granulocytes: 1 %
Lymphocytes Relative: 9 %
Lymphs Abs: 0.7 K/uL (ref 0.7–4.0)
MCH: 30.2 pg (ref 26.0–34.0)
MCHC: 33.8 g/dL (ref 30.0–36.0)
MCV: 89.3 fL (ref 80.0–100.0)
Monocytes Absolute: 0.5 K/uL (ref 0.1–1.0)
Monocytes Relative: 7 %
Neutro Abs: 4.4 K/uL (ref 1.7–7.7)
Neutrophils Relative %: 62 %
Platelet Count: 231 K/uL (ref 150–400)
RBC: 3.74 MIL/uL — ABNORMAL LOW (ref 4.22–5.81)
RDW: 14.3 % (ref 11.5–15.5)
WBC Count: 7 K/uL (ref 4.0–10.5)
nRBC: 0 % (ref 0.0–0.2)

## 2024-06-26 MED ORDER — SODIUM CHLORIDE 0.9% FLUSH
10.0000 mL | Freq: Once | INTRAVENOUS | Status: AC
  Administered 2024-06-26: 10 mL

## 2024-06-26 MED ORDER — SODIUM CHLORIDE 0.9% FLUSH
10.0000 mL | INTRAVENOUS | Status: DC | PRN
Start: 2024-06-26 — End: 2024-06-26
  Administered 2024-06-26: 10 mL

## 2024-06-26 MED ORDER — SODIUM CHLORIDE 0.9 % IV SOLN: INTRAVENOUS | Status: DC

## 2024-06-26 MED ORDER — SODIUM CHLORIDE 0.9 % IV SOLN
350.0000 mg | Freq: Once | INTRAVENOUS | Status: AC
  Administered 2024-06-26: 350 mg via INTRAVENOUS
  Filled 2024-06-26: qty 7

## 2024-06-26 NOTE — Patient Instructions (Addendum)
 [facility] - A DEPT OF [facility]  Discharge Instructions: Thank you for choosing [facility] to provide your oncology and hematology care.   If you have a lab appointment with the Cancer Center, please go directly to the Cancer Center and check in at the registration area.   Wear comfortable clothing and clothing appropriate for easy access to any Portacath or PICC line.   We strive to give you quality time with your provider. You may need to reschedule your appointment if you arrive late (15 or more minutes).  Arriving late affects you and other patients whose appointments are after yours.  Also, if you miss three or more appointments without notifying the office, you may be dismissed from the clinic at the provider's discretion.      For prescription refill requests, have your pharmacy contact our office and allow 72 hours for refills to be completed.    Today you received the following chemotherapy and/or immunotherapy agent: Cemiplimab  (Libtayo )   To help prevent nausea and vomiting after your treatment, we encourage you to take your nausea medication as directed.  BELOW ARE SYMPTOMS THAT SHOULD BE REPORTED IMMEDIATELY: *FEVER GREATER THAN 100.4 F (38 C) OR HIGHER *CHILLS OR SWEATING *NAUSEA AND VOMITING THAT IS NOT CONTROLLED WITH YOUR NAUSEA MEDICATION *UNUSUAL SHORTNESS OF BREATH *UNUSUAL BRUISING OR BLEEDING *URINARY PROBLEMS (pain or burning when urinating, or frequent urination) *BOWEL PROBLEMS (unusual diarrhea, constipation, pain near the anus) TENDERNESS IN MOUTH AND THROAT WITH OR WITHOUT PRESENCE OF ULCERS (sore throat, sores in mouth, or a toothache) UNUSUAL RASH, SWELLING OR PAIN  UNUSUAL VAGINAL DISCHARGE OR ITCHING   Items with * indicate a potential emergency and should be followed up as soon as possible or go to the Emergency Department if any problems should occur.  Please show the CHEMOTHERAPY ALERT CARD or  IMMUNOTHERAPY ALERT CARD at check-in to the Emergency Department and triage nurse.  Should you have questions after your visit or need to cancel or reschedule your appointment, please contact [facility]  Dept: [phone]  and follow the prompts.  Office hours are 8:00 a.m. to 4:30 p.m. Monday - Friday. Please note that voicemails left after 4:00 p.m. may not be returned until the following business day.  We are closed weekends and major holidays. You have access to a nurse at all times for urgent questions. Please call the main number to the clinic Dept: [phone] and follow the prompts.   For any non-urgent questions, you may also contact your provider using MyChart. We now offer e-Visits for anyone 37 and older to request care online for non-urgent symptoms. For details visit [email or web address].   Also download the MyChart app! Go to the app store, search "MyChart", open the app, select [place], and log in with your MyChart username and password.

## 2024-07-15 ENCOUNTER — Other Ambulatory Visit: Payer: Self-pay

## 2024-07-17 ENCOUNTER — Inpatient Hospital Stay: Attending: Internal Medicine

## 2024-07-17 ENCOUNTER — Inpatient Hospital Stay (HOSPITAL_BASED_OUTPATIENT_CLINIC_OR_DEPARTMENT_OTHER): Admitting: Internal Medicine

## 2024-07-17 ENCOUNTER — Inpatient Hospital Stay

## 2024-07-17 VITALS — BP 135/64 | HR 72 | Temp 97.7°F | Resp 17 | Ht 65.0 in | Wt 142.0 lb

## 2024-07-17 DIAGNOSIS — J312 Chronic pharyngitis: Secondary | ICD-10-CM | POA: Insufficient documentation

## 2024-07-17 DIAGNOSIS — C3411 Malignant neoplasm of upper lobe, right bronchus or lung: Secondary | ICD-10-CM | POA: Diagnosis present

## 2024-07-17 DIAGNOSIS — E041 Nontoxic single thyroid nodule: Secondary | ICD-10-CM | POA: Insufficient documentation

## 2024-07-17 DIAGNOSIS — C3491 Malignant neoplasm of unspecified part of right bronchus or lung: Secondary | ICD-10-CM

## 2024-07-17 DIAGNOSIS — R1310 Dysphagia, unspecified: Secondary | ICD-10-CM | POA: Diagnosis not present

## 2024-07-17 DIAGNOSIS — D6481 Anemia due to antineoplastic chemotherapy: Secondary | ICD-10-CM | POA: Diagnosis not present

## 2024-07-17 DIAGNOSIS — C3490 Malignant neoplasm of unspecified part of unspecified bronchus or lung: Secondary | ICD-10-CM | POA: Diagnosis not present

## 2024-07-17 DIAGNOSIS — R530 Neoplastic (malignant) related fatigue: Secondary | ICD-10-CM | POA: Insufficient documentation

## 2024-07-17 DIAGNOSIS — Z5112 Encounter for antineoplastic immunotherapy: Secondary | ICD-10-CM | POA: Insufficient documentation

## 2024-07-17 LAB — CBC WITH DIFFERENTIAL (CANCER CENTER ONLY)
Abs Immature Granulocytes: 0.02 K/uL (ref 0.00–0.07)
Basophils Absolute: 0 K/uL (ref 0.0–0.1)
Basophils Relative: 1 %
Eosinophils Absolute: 0.7 K/uL — ABNORMAL HIGH (ref 0.0–0.5)
Eosinophils Relative: 11 %
HCT: 36.2 % — ABNORMAL LOW (ref 39.0–52.0)
Hemoglobin: 12.2 g/dL — ABNORMAL LOW (ref 13.0–17.0)
Immature Granulocytes: 0 %
Lymphocytes Relative: 10 %
Lymphs Abs: 0.6 K/uL — ABNORMAL LOW (ref 0.7–4.0)
MCH: 29.3 pg (ref 26.0–34.0)
MCHC: 33.7 g/dL (ref 30.0–36.0)
MCV: 86.8 fL (ref 80.0–100.0)
Monocytes Absolute: 0.7 K/uL (ref 0.1–1.0)
Monocytes Relative: 10 %
Neutro Abs: 4.6 K/uL (ref 1.7–7.7)
Neutrophils Relative %: 68 %
Platelet Count: 236 K/uL (ref 150–400)
RBC: 4.17 MIL/uL — ABNORMAL LOW (ref 4.22–5.81)
RDW: 12.4 % (ref 11.5–15.5)
WBC Count: 6.7 K/uL (ref 4.0–10.5)
nRBC: 0 % (ref 0.0–0.2)

## 2024-07-17 LAB — CMP (CANCER CENTER ONLY)
ALT: 17 U/L (ref 0–44)
AST: 24 U/L (ref 15–41)
Albumin: 3.8 g/dL (ref 3.5–5.0)
Alkaline Phosphatase: 88 U/L (ref 38–126)
Anion gap: 5 (ref 5–15)
BUN: 23 mg/dL (ref 8–23)
CO2: 28 mmol/L (ref 22–32)
Calcium: 9.1 mg/dL (ref 8.9–10.3)
Chloride: 107 mmol/L (ref 98–111)
Creatinine: 0.97 mg/dL (ref 0.61–1.24)
GFR, Estimated: >60 mL/min (ref >60)
Glucose, Bld: 151 mg/dL — ABNORMAL HIGH (ref 70–99)
Potassium: 3.6 mmol/L (ref 3.5–5.1)
Sodium: 140 mmol/L (ref 135–145)
Total Bilirubin: 0.5 mg/dL (ref 0.0–1.2)
Total Protein: 7.3 g/dL (ref 6.5–8.1)

## 2024-07-17 LAB — TSH: TSH: 3.13 u[IU]/mL (ref 0.350–4.500)

## 2024-07-17 MED ORDER — SODIUM CHLORIDE 0.9 % IV SOLN
350.0000 mg | Freq: Once | INTRAVENOUS | Status: AC
  Administered 2024-07-17: 350 mg via INTRAVENOUS
  Filled 2024-07-17: qty 7

## 2024-07-17 MED ORDER — SODIUM CHLORIDE 0.9 % IV SOLN: INTRAVENOUS | Status: DC

## 2024-07-17 NOTE — Patient Instructions (Signed)
 [facility] - A DEPT OF [facility]  Discharge Instructions: Thank you for choosing [facility] to provide your oncology and hematology care.   If you have a lab appointment with the Cancer Center, please go directly to the Cancer Center and check in at the registration area.   Wear comfortable clothing and clothing appropriate for easy access to any Portacath or PICC line.   We strive to give you quality time with your provider. You may need to reschedule your appointment if you arrive late (15 or more minutes).  Arriving late affects you and other patients whose appointments are after yours.  Also, if you miss three or more appointments without notifying the office, you may be dismissed from the clinic at the provider's discretion.      For prescription refill requests, have your pharmacy contact our office and allow 72 hours for refills to be completed.    Today you received the following chemotherapy and/or immunotherapy agents libtayo      To help prevent nausea and vomiting after your treatment, we encourage you to take your nausea medication as directed.  BELOW ARE SYMPTOMS THAT SHOULD BE REPORTED IMMEDIATELY: *FEVER GREATER THAN 100.4 F (38 C) OR HIGHER *CHILLS OR SWEATING *NAUSEA AND VOMITING THAT IS NOT CONTROLLED WITH YOUR NAUSEA MEDICATION *UNUSUAL SHORTNESS OF BREATH *UNUSUAL BRUISING OR BLEEDING *URINARY PROBLEMS (pain or burning when urinating, or frequent urination) *BOWEL PROBLEMS (unusual diarrhea, constipation, pain near the anus) TENDERNESS IN MOUTH AND THROAT WITH OR WITHOUT PRESENCE OF ULCERS (sore throat, sores in mouth, or a toothache) UNUSUAL RASH, SWELLING OR PAIN  UNUSUAL VAGINAL DISCHARGE OR ITCHING   Items with * indicate a potential emergency and should be followed up as soon as possible or go to the Emergency Department if any problems should occur.  Please show the CHEMOTHERAPY ALERT CARD or IMMUNOTHERAPY  ALERT CARD at check-in to the Emergency Department and triage nurse.  Should you have questions after your visit or need to cancel or reschedule your appointment, please contact [facility] - A DEPT OF [facility]  Dept: [phone]  and follow the prompts.  Office hours are 8:00 a.m. to 4:30 p.m. Monday - Friday. Please note that voicemails left after 4:00 p.m. may not be returned until the following business day.  We are closed weekends and major holidays. You have access to a nurse at all times for urgent questions. Please call the main number to the clinic Dept: [phone] and follow the prompts.   For any non-urgent questions, you may also contact your provider using MyChart. We now offer e-Visits for anyone 104 and older to request care online for non-urgent symptoms. For details visit [email or web address].   Also download the MyChart app! Go to the app store, search "MyChart", open the app, select [place], and log in with your MyChart username and password.

## 2024-07-17 NOTE — Progress Notes (Signed)
 [facility] [phone]   [phone]) [phone]  OFFICE PROGRESS NOTE  [name], Charlie ORN, MD [address] [place] [address]  DIAGNOSIS:  1) Stage IIIb (T3, N2, M0) non-small cell lung cancer, squamous cell carcinoma presented with 2 separate right upper lobe lung nodule in addition to mediastinal lymphadenopathy diagnosed in January 2024. 2) left thyroid  nodule.  PDL1 expression: 1%   PRIOR THERAPY:  1) A course of concurrent chemoradiation with weekly carboplatin  for AUC of 2 and paclitaxel  45 Mg/M2. Last dose on 02/05/23. Status post 6 cycles. Taxol  changed to abraxane  starting from cycle #4 due to reaction to taxol   2) Consolidation immunotherapy with Imfinzi  1500 mg IV every 4 weeks.  First dose expected on 03/12/23.  Status post 7 cycles. 3) CT-guided pulsed field electroporation of the right suprahilar mass under the care of Dr. Sharron at [facility] on 02/12/2024. 4) Systemic chemotherapy with Carboplatin  for AUC of 5 on day 1, gemcitabine  1000 Mg/M2 on days 1 and 8 as well as Libtayo  (Cempilimab) 350 Mg IV on day 1 every 3 weeks.  First dose March 06, 2024.  Status post 4 cycles.   CURRENT THERAPY: Maintenance treatment with immunotherapy with Libtayo  (Cempilimab) 350 mg IV every 3 weeks status post 2 cycles.  INTERVAL HISTORY: Alan Mendez 83 y.o. male returns to the clinic today for follow-up visit. Discussed the use of AI scribe software for clinical note transcription with the patient, who gave verbal consent to proceed.  History of Present Illness Alan Mendez is an 83 year old male with stage 3B non-small cell lung cancer who presents for evaluation before starting the third cycle of chemotherapy.  He was diagnosed with stage 3B non-small cell lung cancer in January 2024 and has evidence of disease progression noted in March 2020. He is currently undergoing chemotherapy and is here for evaluation before starting the third  cycle. He feels generally well but experiences fatigue, which he attributes to aging or possibly needing more exercise. He remains active, including cycling, and has no breathing issues.  He has ongoing throat problems, which he attributes to radiation therapy. He experiences difficulty swallowing and pain when eating certain foods.  His weight remains stable at approximately 142 pounds, fluctuating slightly depending on the time of day and meals. He has no issues with his current weight.  He has experienced dizziness when bending down. His hemoglobin levels have improved over time, currently at 12.2, up from 11.3 and 10 in previous tests. He reports no issues with his current immunotherapy regimen.     MEDICAL HISTORY: Past Medical History:  Diagnosis Date   Acquired solitary kidney    left --  s/p  right nephroureterctomy 01/ 2018   Anxiety    no current problems   Depression    no current problems   Elevated PSA    Enlarged lymph node 11/2022   enlarged right paratracheal lymph node.   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, negative margins)   History of kidney stones    passed stones and also surgery to remove   History of unilateral nephrectomy    01/ 2018  right nephroureterectomy for renal pelvis mass (carcinoma in situ)   HLD (  hyperlipidemia)    on crestor    Hyperplasia of prostate without lower urinary tract symptoms (LUTS)    Lung cancer (HCC) 12/07/2022   Nephrolithiasis    bilateral non-obstructive   Pneumonia    x 1 - yrs ago   Pulmonary nodules 11/2022   2 right upper lobe pulmonary nodules   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HCC)    Renal cyst, acquired, right    right kidney removed   Wears glasses     ALLERGIES:  is allergic to  lamisil [terbinafine] and ivp dye [iodinated contrast media].  MEDICATIONS:  Current Outpatient Medications  Medication Sig Dispense Refill   cholecalciferol  (VITAMIN D3) 25 MCG (1000 UNIT) tablet Take 1,000 Units by mouth daily.     diphenhydrAMINE  (BENADRYL ) 50 MG tablet Take 1 tablet 2 hours before the CT scan. 1 tablet 0   lidocaine -prilocaine  (EMLA ) cream Apply small amount cream to port a cath site 30-60 minutes prior to accessing port 30 g 0   Multiple Vitamins-Minerals (MULTIVITAMIN WITH MINERALS) tablet Take 1 tablet by mouth daily. Centrum     predniSONE  (DELTASONE ) 50 MG tablet 1 tablet p.o. 13 hours then 7 hours then 2 hours before the scan. Please take Benadryl  50 mg p.o. x  2 hours before the scan with the last dose of prednisone . 3 tablet 0   prochlorperazine  (COMPAZINE ) 10 MG tablet Take 10 mg by mouth every 6 (six) hours as needed for nausea or vomiting.     rosuvastatin  (CRESTOR ) 10 MG tablet TAKE 1 TABLET EACH DAY. (Patient taking differently: Take 20 mg by mouth daily.) 30 tablet 0   silver  sulfADIAZINE  (SILVADENE ) 1 % cream Apply 1 Application topically daily. 50 g 0   sucralfate  (CARAFATE ) 1 g tablet Take 1 tablet (1 g total) by mouth 4 (four) times daily -  with meals and at bedtime. Dissolve tablet in water  -swish and swallow. 30 tablet 2   tamsulosin  (FLOMAX ) 0.4 MG CAPS capsule Take 1 capsule (0.4 mg total) by mouth daily. (Patient taking differently: Take 0.4 mg by mouth daily after breakfast.) 30 capsule 5   No current facility-administered medications for this visit.    SURGICAL HISTORY:  Past Surgical History:  Procedure Laterality Date   APPENDECTOMY  child   BRONCHIAL BIOPSY  12/07/2022   Procedure: BRONCHIAL BIOPSIES;  Surgeon: Brenna Adine CROME, DO;  Location: [facility];  Service: Pulmonary;;   BRONCHIAL BRUSHINGS  12/07/2022   Procedure: BRONCHIAL BRUSHINGS;  Surgeon: Brenna Adine CROME, DO;  Location: [facility];  Service: Pulmonary;;   BRONCHIAL NEEDLE  ASPIRATION BIOPSY  12/07/2022   Procedure: BRONCHIAL NEEDLE ASPIRATION BIOPSIES;  Surgeon: Brenna Adine CROME, DO;  Location: [facility];  Service: Pulmonary;;   CATARACT EXTRACTION W/ INTRAOCULAR LENS  IMPLANT, BILATERAL  2017   COLONOSCOPY  08/15/2012   Tubular Adenoma, No high grade dysplasia or malignacy.   CYSTOSCOPY W/ RETROGRADES Right 09/25/2016   Procedure: CYSTOSCOPY, URETHRAL DILITATION  WITH RETROGRADE PYELOGRAM, BRUSH BIOPSIES OF RIGHT RENAL MASS;  Surgeon: Arlena Gal, MD;  Location: [facility];  Service: Urology;  Laterality: Right;   CYSTOSCOPY W/ RETROGRADES Left 03/16/2017   Procedure: CYSTOSCOPY WITH RETROGRADE PYELOGRAM;  Surgeon: Cam Morene ORN, MD;  Location: [facility];  Service: Urology;  Laterality: Left;   CYSTOSCOPY W/ URETERAL STENT REMOVAL Right 09/25/2016   Procedure: CYSTOSCOPY WITH STENT REMOVAL;  Surgeon: Arlena Gal, MD;  Location: [facility];  Service: Urology;  Laterality: Right;   CYSTOSCOPY WITH  RETROGRADE PYELOGRAM, URETEROSCOPY AND STENT PLACEMENT Right 08/21/2016   Procedure: CYSTOSCOPY WITH RIGHT RETROGRADE PYELOGRAM, RIGHT FLEXIBLE AND RIGID URETEROSCOPY, INSERTION DOUBLE J STENT RIGHT;  Surgeon: Arlena Gal, MD;  Location: [facility];  Service: Urology;  Laterality: Right;   EXTRACORPOREAL SHOCK WAVE LITHOTRIPSY  2009   IR IMAGING GUIDED PORT INSERTION  07/09/2023   KNEE SURGERY Right 1980's   ROBOT ASSITED LAPAROSCOPIC NEPHROURETERECTOMY Right 11/15/2016   Procedure: RIGHT XI ROBOT ASSITED LAPAROSCOPIC NEPHROURETERECTOMY;  Surgeon: Morene LELON Salines, MD;  Location: [facility];  Service: Urology;  Laterality: Right;   TRANSURETHRAL RESECTION OF BLADDER TUMOR WITH MITOMYCIN -C Left 03/16/2017   Procedure: CYSTOSCOPY BIOPSIES OF BLADDER TUMOR WITH FULGURATION;  Surgeon: Salines Morene LELON, MD;  Location: [facility];  Service: Urology;  Laterality: Left;    URETEROSCOPY Right 09/25/2016   Procedure: RIGHT URETEROSCOPY;  Surgeon: Arlena Gal, MD;  Location: [facility];  Service: Urology;  Laterality: Right;   VIDEO BRONCHOSCOPY WITH ENDOBRONCHIAL ULTRASOUND Right 12/07/2022   Procedure: VIDEO BRONCHOSCOPY WITH ENDOBRONCHIAL ULTRASOUND;  Surgeon: Brenna Adine CROME, DO;  Location: [facility];  Service: Pulmonary;  Laterality: Right;    REVIEW OF SYSTEMS:  A comprehensive review of systems was negative except for: Constitutional: positive for fatigue   PHYSICAL EXAMINATION: General appearance: alert, cooperative, fatigued, and no distress Head: Normocephalic, without obvious abnormality, atraumatic Neck: no adenopathy, no JVD, supple, symmetrical, trachea midline, and thyroid  not enlarged, symmetric, no tenderness/mass/nodules Lymph nodes: Cervical, supraclavicular, and axillary nodes normal. Resp: clear to auscultation bilaterally Back: symmetric, no curvature. ROM normal. No CVA tenderness. Cardio: regular rate and rhythm, S1, S2 normal, no murmur, click, rub or gallop GI: soft, non-tender; bowel sounds normal; no masses,  no organomegaly Extremities: extremities normal, atraumatic, no cyanosis or edema  ECOG PERFORMANCE STATUS: 1 - Symptomatic but completely ambulatory  Blood pressure 135/64, pulse 72, temperature 97.7 F (36.5 C), temperature source Temporal, resp. rate 17, height 5' 5 (1.651 m), weight 142 lb (64.4 kg), SpO2 100%.  LABORATORY DATA: Lab Results  Component Value Date   WBC 6.7 07/17/2024   HGB 12.2 (L) 07/17/2024   HCT 36.2 (L) 07/17/2024   MCV 86.8 07/17/2024   PLT 236 07/17/2024      Chemistry      Component Value Date/Time   NA 142 06/26/2024 1059   K 3.4 (L) 06/26/2024 1059   CL 108 06/26/2024 1059   CO2 27 06/26/2024 1059   BUN 19 06/26/2024 1059   CREATININE 0.97 06/26/2024 1059   CREATININE 0.85 12/10/2014 1550      Component Value Date/Time   CALCIUM  8.7 (L) 06/26/2024 1059    ALKPHOS 85 06/26/2024 1059   AST 23 06/26/2024 1059   ALT 15 06/26/2024 1059   BILITOT 0.6 06/26/2024 1059       RADIOGRAPHIC STUDIES: No results found.   ASSESSMENT AND PLAN: This is a very pleasant 83 years old white male with Stage IIIb (T3, N2, M0) non-small cell lung cancer, squamous cell carcinoma presented with 2 separate right upper lobe lung nodule in addition to mediastinal lymphadenopathy diagnosed in January 2024. The patient has no evidence of metastatic disease to the brain or extrathoracic metastasis besides the 2 right upper lobe pulmonary nodule and mediastinal lymphadenopathy. I discussed his case with Dr. Kerrin, cardiothoracic surgery and he indicated that because of the location of the lymph node, the patient will not be a great candidate for surgical resection with negative margin. The patient underwent  a course of concurrent chemoradiation with weekly carboplatin  for AUC of 2 and paclitaxel  45 Mg/M2.  Status post 6 cycles.  His treatment with paclitaxel  was changed to Abraxane  starting from cycle #3 with secondary to hypersensitivity reaction. He tolerated this treatment fairly well with partial response. He underwent consolidation treatment with immunotherapy with Imfinzi  1500 Mg IV every 4 weeks status post 7 cycles.  This treatment was discontinued secondary to disease progression. The patient was seen by thoracic surgery at [facility] for consideration of surgical resection but he was not a surgical candidate. The patient underwent pulsed electric field therapy to the right suprahilar mass at [facility]. He underwent systemic chemotherapy with carboplatin  for AUC of 5 on day 1, gemcitabine  1000 Mg/M2 on days 1 and 8 in addition to Libtayo  (Cempilimab) 350 Mg IV on day 1 every 3 weeks.  First dose 03/06/2024.  Status post 4 cycles of treatment. The patient is currently on maintenance treatment with single agent Libtayo  (Cempilimab) 350 mg IV every 3 weeks  status post 2 cycles. He continues to tolerate his treatment fairly well except for mild fatigue. Assessment and Plan Assessment & Plan Stage 3B non-small cell lung cancer, active treatment Stage 3B non-small cell lung cancer with disease progression since March 2020. Currently undergoing active treatment, having completed two cycles and preparing for the third cycle. No new respiratory symptoms and weight remains stable. No significant issues with therapy. - Perform CT scan of the chest in two weeks to assess disease status - Continue with the current treatment regimen - Schedule follow-up appointment in three weeks  Radiation-induced dysphagia and throat pain Persistent dysphagia and throat pain secondary to prior radiation therapy. Symptoms include difficulty swallowing and pain with certain foods. - Consider referral to ENT if symptoms worsen  Anemia secondary to cancer therapy, improving Anemia secondary to cancer therapy is improving. Hemoglobin levels have increased from 10 to 12.2, approaching normal range. Previous dizziness likely related to anemia is resolving.  Cancer-related fatigue Ongoing fatigue, possibly related to cancer and its treatment. Encouraged to engage in regular exercise to help manage fatigue, but advised not to overexert. - Encourage regular exercise with caution to avoid overexertion The patient was advised to call immediately if he has any concerning symptoms in the interval.  The patient voices understanding of current disease status and treatment options and is in agreement with the current care plan.  All questions were answered. The patient knows to call the clinic with any problems, questions or concerns. We can certainly see the patient much sooner if necessary.  The total time spent in the appointment was 20 minutes.  Disclaimer: This note was dictated with voice recognition software. Similar sounding words can inadvertently be transcribed and may not  be corrected upon review.

## 2024-07-18 LAB — T4: T4, Total: 8.9 ug/dL (ref 4.5–12.0)

## 2024-07-19 ENCOUNTER — Other Ambulatory Visit: Payer: Self-pay

## 2024-07-31 ENCOUNTER — Encounter (HOSPITAL_COMMUNITY): Payer: Self-pay

## 2024-07-31 ENCOUNTER — Other Ambulatory Visit: Payer: Self-pay | Admitting: Internal Medicine

## 2024-07-31 ENCOUNTER — Encounter: Payer: Self-pay | Admitting: Internal Medicine

## 2024-07-31 ENCOUNTER — Telehealth: Payer: Self-pay | Admitting: Medical Oncology

## 2024-07-31 ENCOUNTER — Ambulatory Visit (HOSPITAL_COMMUNITY)
Admission: RE | Admit: 2024-07-31 | Discharge: 2024-07-31 | Disposition: A | Discharge status: Home / Self Care | Source: Ambulatory Visit | Attending: Internal Medicine | Admitting: Internal Medicine

## 2024-07-31 DIAGNOSIS — C3490 Malignant neoplasm of unspecified part of unspecified bronchus or lung: Secondary | ICD-10-CM

## 2024-07-31 MED ORDER — PREDNISONE 50 MG PO TABS: ORAL_TABLET | ORAL | 2 refills | Status: AC

## 2024-07-31 MED ORDER — PREDNISONE 50 MG PO TABS: ORAL_TABLET | ORAL | 2 refills | Status: DC

## 2024-07-31 NOTE — Telephone Encounter (Signed)
 Called in prednisone

## 2024-08-04 ENCOUNTER — Ambulatory Visit (HOSPITAL_COMMUNITY)
Admission: RE | Admit: 2024-08-04 | Discharge: 2024-08-04 | Disposition: A | Discharge status: Home / Self Care | Source: Ambulatory Visit | Attending: Internal Medicine | Admitting: Internal Medicine

## 2024-08-04 DIAGNOSIS — I700 Atherosclerosis of aorta: Secondary | ICD-10-CM | POA: Diagnosis not present

## 2024-08-04 DIAGNOSIS — C3490 Malignant neoplasm of unspecified part of unspecified bronchus or lung: Secondary | ICD-10-CM | POA: Insufficient documentation

## 2024-08-04 DIAGNOSIS — J479 Bronchiectasis, uncomplicated: Secondary | ICD-10-CM | POA: Diagnosis not present

## 2024-08-04 DIAGNOSIS — J984 Other disorders of lung: Secondary | ICD-10-CM | POA: Diagnosis not present

## 2024-08-04 MED ORDER — IOHEXOL 300 MG/ML  SOLN
75.0000 mL | Freq: Once | INTRAMUSCULAR | Status: AC | PRN
  Administered 2024-08-04: 75 mL via INTRAVENOUS

## 2024-08-07 ENCOUNTER — Inpatient Hospital Stay

## 2024-08-07 ENCOUNTER — Inpatient Hospital Stay (HOSPITAL_BASED_OUTPATIENT_CLINIC_OR_DEPARTMENT_OTHER): Admitting: Internal Medicine

## 2024-08-07 VITALS — BP 139/82 | HR 69 | Temp 98.0°F | Resp 17 | Ht 65.0 in | Wt 142.0 lb

## 2024-08-07 DIAGNOSIS — C3490 Malignant neoplasm of unspecified part of unspecified bronchus or lung: Secondary | ICD-10-CM | POA: Diagnosis not present

## 2024-08-07 DIAGNOSIS — C3491 Malignant neoplasm of unspecified part of right bronchus or lung: Secondary | ICD-10-CM

## 2024-08-07 DIAGNOSIS — J312 Chronic pharyngitis: Secondary | ICD-10-CM | POA: Diagnosis not present

## 2024-08-07 DIAGNOSIS — E041 Nontoxic single thyroid nodule: Secondary | ICD-10-CM | POA: Diagnosis not present

## 2024-08-07 DIAGNOSIS — D6481 Anemia due to antineoplastic chemotherapy: Secondary | ICD-10-CM | POA: Diagnosis not present

## 2024-08-07 DIAGNOSIS — R530 Neoplastic (malignant) related fatigue: Secondary | ICD-10-CM | POA: Diagnosis not present

## 2024-08-07 DIAGNOSIS — Z5112 Encounter for antineoplastic immunotherapy: Secondary | ICD-10-CM | POA: Diagnosis not present

## 2024-08-07 DIAGNOSIS — C3411 Malignant neoplasm of upper lobe, right bronchus or lung: Secondary | ICD-10-CM | POA: Diagnosis not present

## 2024-08-07 LAB — CBC WITH DIFFERENTIAL (CANCER CENTER ONLY)
Abs Immature Granulocytes: 0.06 K/uL (ref 0.00–0.07)
Basophils Absolute: 0 K/uL (ref 0.0–0.1)
Basophils Relative: 0 %
Eosinophils Absolute: 0.2 K/uL (ref 0.0–0.5)
Eosinophils Relative: 3 %
HCT: 36.8 % — ABNORMAL LOW (ref 39.0–52.0)
Hemoglobin: 12.5 g/dL — ABNORMAL LOW (ref 13.0–17.0)
Immature Granulocytes: 1 %
Lymphocytes Relative: 9 %
Lymphs Abs: 0.7 K/uL (ref 0.7–4.0)
MCH: 28.9 pg (ref 26.0–34.0)
MCHC: 34 g/dL (ref 30.0–36.0)
MCV: 85.2 fL (ref 80.0–100.0)
Monocytes Absolute: 0.8 K/uL (ref 0.1–1.0)
Monocytes Relative: 10 %
Neutro Abs: 6.1 K/uL (ref 1.7–7.7)
Neutrophils Relative %: 77 %
Platelet Count: 247 K/uL (ref 150–400)
RBC: 4.32 MIL/uL (ref 4.22–5.81)
RDW: 12.2 % (ref 11.5–15.5)
WBC Count: 7.9 K/uL (ref 4.0–10.5)
nRBC: 0 % (ref 0.0–0.2)

## 2024-08-07 LAB — CMP (CANCER CENTER ONLY)
ALT: 13 U/L (ref 0–44)
AST: 16 U/L (ref 15–41)
Albumin: 3.8 g/dL (ref 3.5–5.0)
Alkaline Phosphatase: 96 U/L (ref 38–126)
Anion gap: 4 — ABNORMAL LOW (ref 5–15)
BUN: 23 mg/dL (ref 8–23)
CO2: 28 mmol/L (ref 22–32)
Calcium: 8.8 mg/dL — ABNORMAL LOW (ref 8.9–10.3)
Chloride: 105 mmol/L (ref 98–111)
Creatinine: 0.98 mg/dL (ref 0.61–1.24)
GFR, Estimated: >60 mL/min (ref >60)
Glucose, Bld: 143 mg/dL — ABNORMAL HIGH (ref 70–99)
Potassium: 3.8 mmol/L (ref 3.5–5.1)
Sodium: 137 mmol/L (ref 135–145)
Total Bilirubin: 0.4 mg/dL (ref 0.0–1.2)
Total Protein: 7 g/dL (ref 6.5–8.1)

## 2024-08-07 MED ORDER — SODIUM CHLORIDE 0.9 % IV SOLN
350.0000 mg | Freq: Once | INTRAVENOUS | Status: AC
  Administered 2024-08-07: 350 mg via INTRAVENOUS
  Filled 2024-08-07: qty 7

## 2024-08-07 MED ORDER — SODIUM CHLORIDE 0.9 % IV SOLN: INTRAVENOUS | Status: DC

## 2024-08-07 NOTE — Progress Notes (Signed)
 [facility] [phone]   [phone]  OFFICE PROGRESS NOTE  Mendez, Alan ORN, MD [address] [place] [address]  DIAGNOSIS:  1) Stage IIIb (T3, N2, M0) non-small cell lung cancer, squamous cell carcinoma presented with 2 separate right upper lobe lung nodule in addition to mediastinal lymphadenopathy diagnosed in January 2024. 2) left thyroid  nodule.  PDL1 expression: 1%   PRIOR THERAPY:  1) A course of concurrent chemoradiation with weekly carboplatin  for AUC of 2 and paclitaxel  45 Mg/M2. Last dose on 02/05/23. Status post 6 cycles. Taxol  changed to abraxane  starting from cycle #4 due to reaction to taxol   2) Consolidation immunotherapy with Imfinzi  1500 mg IV every 4 weeks.  First dose expected on 03/12/23.  Status post 7 cycles. 3) CT-guided pulsed field electroporation of the right suprahilar mass under the care of Dr. Sharron at [facility] on 02/12/2024. 4) Systemic chemotherapy with Carboplatin  for AUC of 5 on day 1, gemcitabine  1000 Mg/M2 on days 1 and 8 as well as Libtayo  (Cempilimab) 350 Mg IV on day 1 every 3 weeks.  First dose March 06, 2024.  Status post 4 cycles.   CURRENT THERAPY: Maintenance treatment with immunotherapy with Libtayo  (Cempilimab) 350 mg IV every 3 weeks status post 3 cycles.  INTERVAL HISTORY: Alan Mendez 83 y.o. male returns to the clinic today for follow-up visit. Discussed the use of AI scribe software for clinical note transcription with the patient, who gave verbal consent to proceed.  History of Present Illness Alan Mendez is an 83 year old male with stage 3B non-small cell lung cancer who presents for evaluation with a repeat CT scan of the chest for restaging of his disease. He is accompanied by his wife.  He was diagnosed with stage 3B non-small cell lung cancer in January 2024, with no actionable mutation and a PD-L1 expression of 1%. Initially, he underwent concurrent chemoradiation  with weekly carboplatin  and paclitaxel , followed by seven cycles of consolidation treatment with durvalumab  every four weeks, which was discontinued due to disease progression.  Subsequently, he underwent CT-guided pulsed field electroporation of the right suprahilar mass at [facility]. He then started systemic chemotherapy with carboplatin  and gemcitabine , in addition to Libtayo  every three weeks. After four cycles, he transitioned to maintenance treatment with single-agent Libtayo  every three weeks and has completed three cycles of this course.  He feels generally fine except for a constant sore throat, which he describes as 'a little rougher' than before. He is regaining his energy and has resumed bicycling and playing golf. No chest pain, nausea, vomiting, rash, or diarrhea, but he notes a cough associated with his throat discomfort.     MEDICAL HISTORY: Past Medical History:  Diagnosis Date   Acquired solitary kidney    left --  s/p  right nephroureterctomy 01/ 2018   Anxiety    no current problems   Depression    no current problems   Elevated PSA    Enlarged lymph node 11/2022   enlarged right paratracheal lymph node.   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, negative margins)   History of kidney stones    passed stones and also surgery to remove   History of unilateral  nephrectomy    01/ 2018  right nephroureterectomy for renal pelvis mass (carcinoma in situ)   HLD (hyperlipidemia)    on crestor    Hyperplasia of prostate without lower urinary tract symptoms (LUTS)    Lung cancer (HCC) 12/07/2022   Nephrolithiasis    bilateral non-obstructive   Pneumonia    x 1 - yrs ago   Pulmonary nodules 11/2022   2 right upper lobe pulmonary nodules   Recurrent  bladder papillary carcinoma (HCC)    Renal cyst, acquired, right    right kidney removed   Wears glasses     ALLERGIES:  is allergic to lamisil [terbinafine] and ivp dye [iodinated contrast media].  MEDICATIONS:  Current Outpatient Medications  Medication Sig Dispense Refill   cholecalciferol  (VITAMIN D3) 25 MCG (1000 UNIT) tablet Take 1,000 Units by mouth daily.     diphenhydrAMINE  (BENADRYL ) 50 MG tablet Take 1 tablet 2 hours before the CT scan. 1 tablet 0   lidocaine -prilocaine  (EMLA ) cream Apply small amount cream to port a cath site 30-60 minutes prior to accessing port 30 g 0   Multiple Vitamins-Minerals (MULTIVITAMIN WITH MINERALS) tablet Take 1 tablet by mouth daily. Centrum     predniSONE  (DELTASONE ) 50 MG tablet 1 tablet 13 hours before then 7 hours before then 2 hours before the CT scan. Please take Benadryl  over-the-counter 50 mg 1 time 2 hours before the scan with the last dose of prednisone . 3 tablet 2   prochlorperazine  (COMPAZINE ) 10 MG tablet Take 10 mg by mouth every 6 (six) hours as needed for nausea or vomiting.     rosuvastatin  (CRESTOR ) 10 MG tablet TAKE 1 TABLET EACH DAY. (Patient taking differently: Take 20 mg by mouth daily.) 30 tablet 0   silver  sulfADIAZINE  (SILVADENE ) 1 % cream Apply 1 Application topically daily. 50 g 0   sucralfate  (CARAFATE ) 1 g tablet Take 1 tablet (1 g total) by mouth 4 (four) times daily -  with meals and at bedtime. Dissolve tablet in water  -swish and swallow. 30 tablet 2   tamsulosin  (FLOMAX ) 0.4 MG CAPS capsule Take 1 capsule (0.4 mg total) by mouth daily. (Patient taking differently: Take 0.4 mg by mouth daily after breakfast.) 30 capsule 5   No current facility-administered medications for this visit.    SURGICAL HISTORY:  Past Surgical History:  Procedure Laterality Date   APPENDECTOMY  child   BRONCHIAL BIOPSY  12/07/2022   Procedure: BRONCHIAL BIOPSIES;  Surgeon: Brenna Adine CROME, DO;  Location: [facility];  Service:  Pulmonary;;   BRONCHIAL BRUSHINGS  12/07/2022   Procedure: BRONCHIAL BRUSHINGS;  Surgeon: Brenna Adine CROME, DO;  Location: [facility];  Service: Pulmonary;;   BRONCHIAL NEEDLE ASPIRATION BIOPSY  12/07/2022   Procedure: BRONCHIAL NEEDLE ASPIRATION BIOPSIES;  Surgeon: Brenna Adine CROME, DO;  Location: [facility];  Service: Pulmonary;;   CATARACT EXTRACTION W/ INTRAOCULAR LENS  IMPLANT, BILATERAL  2017   COLONOSCOPY  08/15/2012   Tubular Adenoma, No high grade dysplasia or malignacy.   CYSTOSCOPY W/ RETROGRADES Right 09/25/2016   Procedure: CYSTOSCOPY, URETHRAL DILITATION  WITH RETROGRADE PYELOGRAM, BRUSH BIOPSIES OF RIGHT RENAL MASS;  Surgeon: Arlena Gal, MD;  Location: [facility] ;  Service: Urology;  Laterality: Right;   CYSTOSCOPY W/ RETROGRADES Left 03/16/2017   Procedure: CYSTOSCOPY WITH RETROGRADE PYELOGRAM;  Surgeon: Cam Morene ORN, MD;  Location: [facility];  Service: Urology;  Laterality: Left;   CYSTOSCOPY W/ URETERAL STENT REMOVAL Right 09/25/2016   Procedure: CYSTOSCOPY WITH STENT REMOVAL;  Surgeon: Arlena Gal, MD;  Location: [facility];  Service: Urology;  Laterality: Right;   CYSTOSCOPY WITH RETROGRADE PYELOGRAM, URETEROSCOPY AND STENT PLACEMENT Right 08/21/2016   Procedure: CYSTOSCOPY WITH RIGHT RETROGRADE PYELOGRAM, RIGHT FLEXIBLE AND RIGID URETEROSCOPY, INSERTION DOUBLE J STENT RIGHT;  Surgeon: Arlena Gal, MD;  Location: [facility];  Service: Urology;  Laterality: Right;   EXTRACORPOREAL SHOCK WAVE LITHOTRIPSY  2009   IR IMAGING GUIDED PORT INSERTION  07/09/2023   KNEE SURGERY Right 1980's   ROBOT ASSITED LAPAROSCOPIC NEPHROURETERECTOMY Right 11/15/2016   Procedure: RIGHT XI ROBOT ASSITED LAPAROSCOPIC NEPHROURETERECTOMY;  Surgeon: Morene LELON Salines, MD;  Location: [facility];  Service: Urology;  Laterality: Right;   TRANSURETHRAL RESECTION OF BLADDER TUMOR WITH MITOMYCIN -C Left 03/16/2017    Procedure: CYSTOSCOPY BIOPSIES OF BLADDER TUMOR WITH FULGURATION;  Surgeon: Salines Morene LELON, MD;  Location[facility];  Service: Urology;  Laterality: Left;   URETEROSCOPY Right 09/25/2016   Procedure: RIGHT URETEROSCOPY;  Surgeon: Arlena Gal, MD;  Location: [facility];  Service: Urology;  Laterality: Right;   VIDEO BRONCHOSCOPY WITH ENDOBRONCHIAL ULTRASOUND Right 12/07/2022   Procedure: VIDEO BRONCHOSCOPY WITH ENDOBRONCHIAL ULTRASOUND;  Surgeon: Brenna Adine CROME, DO;  Location: [facility];  Service: Pulmonary;  Laterality: Right;    REVIEW OF SYSTEMS:  Constitutional: negative Eyes: negative Ears, nose, mouth, throat, and face: positive for sore throat Respiratory: negative Cardiovascular: negative Gastrointestinal: negative Genitourinary:negative Integument/breast: negative Hematologic/lymphatic: negative Musculoskeletal:negative Neurological: negative Behavioral/Psych: negative Endocrine: negative Allergic/Immunologic: negative   PHYSICAL EXAMINATION: General appearance: alert, cooperative, and no distress Head: Normocephalic, without obvious abnormality, atraumatic Neck: no adenopathy, no JVD, supple, symmetrical, trachea midline, and thyroid  not enlarged, symmetric, no tenderness/mass/nodules Lymph nodes: Cervical, supraclavicular, and axillary nodes normal. Resp: clear to auscultation bilaterally Back: symmetric, no curvature. ROM normal. No CVA tenderness. Cardio: regular rate and rhythm, S1, S2 normal, no murmur, click, rub or gallop GI: soft, non-tender; bowel sounds normal; no masses,  no organomegaly Extremities: extremities normal, atraumatic, no cyanosis or edema Neurologic: Alert and oriented X 3, normal strength and tone. Normal symmetric reflexes. Normal coordination and gait  ECOG PERFORMANCE STATUS: 1 - Symptomatic but completely ambulatory  Blood pressure 139/82, pulse 69, temperature 98 F (36.7 C), temperature  source Temporal, resp. rate 17, height 5' 5 (1.651 m), weight 142 lb (64.4 kg), SpO2 99%.  LABORATORY DATA: Lab Results  Component Value Date   WBC 7.9 08/07/2024   HGB 12.5 (L) 08/07/2024   HCT 36.8 (L) 08/07/2024   MCV 85.2 08/07/2024   PLT 247 08/07/2024      Chemistry      Component Value Date/Time   NA 140 07/17/2024 0829   K 3.6 07/17/2024 0829   CL 107 07/17/2024 0829   CO2 28 07/17/2024 0829   BUN 23 07/17/2024 0829   CREATININE 0.97 07/17/2024 0829   CREATININE 0.85 12/10/2014 1550      Component Value Date/Time   CALCIUM  9.1 07/17/2024 0829   ALKPHOS 88 07/17/2024 0829   AST 24 07/17/2024 0829   ALT 17 07/17/2024 0829   BILITOT 0.5 07/17/2024 0829       RADIOGRAPHIC STUDIES: CT Chest W Contrast Result Date: 08/06/2024 CLINICAL DATA:  Non-small cell lung cancer, staging. * Tracking Code: BO * EXAM: CT CHEST WITH CONTRAST TECHNIQUE: Multidetector CT imaging of the chest was performed during intravenous contrast administration. RADIATION DOSE REDUCTION: This exam was performed according to the departmental dose-optimization program which includes automated exposure control, adjustment  of the mA and/or kV according to patient size and/or use of iterative reconstruction technique. CONTRAST:  75mL OMNIPAQUE  IOHEXOL  300 MG/ML  SOLN COMPARISON:  Chest CT 04/30/2024 and 08/21/2023.  PET-CT 09/11/2023. FINDINGS: Cardiovascular: No acute vascular findings. Right IJ Port-A-Cath extends to the superior cavoatrial junction. There is atherosclerosis of the aorta, great vessels and coronary arteries. Mild calcifications of the aortic valve. The heart size is normal. There is no pericardial effusion. Mediastinum/Nodes: Small mediastinal lymph nodes appear stable, including a retrotracheal node which is difficult to differentiate from the adjacent esophagus, although was not hypermetabolic on prior PET-CT. There are no enlarged axillary lymph nodes. The thyroid  gland, trachea and  esophagus demonstrate no significant findings. Lungs/Pleura: No pleural effusion or pneumothorax. Treatment changes in the right perihilar region are again noted with associated architectural distortion, volume loss and bronchiectasis. At the site of the previously demonstrated hypermetabolic mass medially in the right upper lobe, there is slightly increased mass-like density, measuring up to 3.6 x 2.3 cm transverse on image 33/2 (previously 2.4 x 1.6 cm). This measures up to 4.0 cm on sagittal image 65/8. Apart from this, there are no new or enlarging pulmonary nodules. Upper abdomen: No acute or suspicious findings are seen in the visualized upper abdomen. There is a calcified gallstone, cyst in the left hepatic lobe, surgical changes from previous right nephrectomy and a stable cystic appearing lesion in the left kidney for which no specific follow-up imaging is recommended. Musculoskeletal/Chest wall: There is no chest wall mass or suspicious osseous finding. Moderate thoracolumbar scoliosis again noted. IMPRESSION: 1. Interval increased mass-like density in the right upper lobe at the site of the previously demonstrated hypermetabolic mass. While this could reflect treatment change, local progression of disease is difficult to exclude. Recommend attention on short-term follow-up chest CT or repeat PET-CT. 2. No evidence of metastatic disease. Stable small mediastinal lymph nodes, likely reactive. 3. Stable additional incidental findings, including cholelithiasis. 4.  Aortic Atherosclerosis (ICD10-I70.0). Electronically Signed   By: Elsie Perone M.D.   On: 08/06/2024 11:43     ASSESSMENT AND PLAN: This is a very pleasant 83 years old white male with Stage IIIb (T3, N2, M0) non-small cell lung cancer, squamous cell carcinoma presented with 2 separate right upper lobe lung nodule in addition to mediastinal lymphadenopathy diagnosed in January 2024. The patient has no evidence of metastatic disease to the  brain or extrathoracic metastasis besides the 2 right upper lobe pulmonary nodule and mediastinal lymphadenopathy. I discussed his case with Dr. Kerrin, cardiothoracic surgery and he indicated that because of the location of the lymph node, the patient will not be a great candidate for surgical resection with negative margin. The patient underwent a course of concurrent chemoradiation with weekly carboplatin  for AUC of 2 and paclitaxel  45 Mg/M2.  Status post 6 cycles.  His treatment with paclitaxel  was changed to Abraxane  starting from cycle #3 with secondary to hypersensitivity reaction. He tolerated this treatment fairly well with partial response. He underwent consolidation treatment with immunotherapy with Imfinzi  1500 Mg IV every 4 weeks status post 7 cycles.  This treatment was discontinued secondary to disease progression. The patient was seen by thoracic surgery at [facility] for consideration of surgical resection but he was not a surgical candidate. The patient underwent pulsed electric field therapy to the right suprahilar mass at [facility]. He underwent systemic chemotherapy with carboplatin  for AUC of 5 on day 1, gemcitabine  1000 Mg/M2 on days 1 and 8 in addition to  Libtayo  (Cempilimab) 350 Mg IV on day 1 every 3 weeks.  First dose 03/06/2024.  Status post 4 cycles of treatment. The patient is currently on maintenance treatment with single agent Libtayo  (Cempilimab) 350 mg IV every 3 weeks status post 3 cycles. He continues to tolerate his treatment fairly well except for mild fatigue. He had repeat CT scan of the chest performed recently.  I personally independently reviewed the scan and discussed the result and showed the images to the patient and his wife today.  Unfortunately his scan showed increase in the size of the masslike density in the right upper lobe at the site of the previously demonstrated hypermetabolic mass. This is suspicious for disease progression but  inflammatory process from the treatment could be also a consideration. Assessment and Plan Assessment & Plan Stage 3B non-small cell lung cancer, right suprahilar mass Recent CT scan indicates an increase in the size of the right suprahilar mass, suggesting possible disease progression. Differential diagnosis includes inflammation from immunotherapy versus true progression. Previous treatments included concurrent chemoradiation, consolidation immunotherapy with durvalumab , and systemic chemotherapy with carboplatin  and gemcitabine . Currently on maintenance therapy with Libtayo . Surgical intervention is risky due to the mass's location. Options include repeating pulsed field electroporation or reintroducing chemotherapy if growth is confirmed. Discussed consulting another surgeon for a second opinion, highlighting potential risks due to the mass's tricky location. - Order PET scan to assess mass activity and differentiate between inflammation and progression. - Continue maintenance treatment with Libtayo . - Evaluate PET scan results in three weeks to determine further management. - Consider repeating pulsed field electroporation or reintroducing chemotherapy based on PET scan results. - Communicate with [facility] regarding access to CT scan images if further intervention is needed.  Chronic sore throat Chronic sore throat possibly related to ongoing cancer treatment. Symptoms have worsened slightly but are not associated with nausea, vomiting, rash, or diarrhea. He prefers to avoid further intervention if possible. - Monitor symptoms and reassess if they worsen or become more bothersome. He was advised to call immediately if he has any other concerning symptoms in the interval. The patient voices understanding of current disease status and treatment options and is in agreement with the current care plan.  All questions were answered. The patient knows to call the clinic with any problems,  questions or concerns. We can certainly see the patient much sooner if necessary.  The total time spent in the appointment was 30 minutes.  Disclaimer: This note was dictated with voice recognition software. Similar sounding words can inadvertently be transcribed and may not be corrected upon review.

## 2024-08-08 DIAGNOSIS — Z23 Encounter for immunization: Secondary | ICD-10-CM | POA: Diagnosis not present

## 2024-08-21 ENCOUNTER — Ambulatory Visit (HOSPITAL_COMMUNITY)
Admission: RE | Admit: 2024-08-21 | Discharge: 2024-08-21 | Disposition: A | Discharge status: Home / Self Care | Source: Ambulatory Visit | Attending: Internal Medicine | Admitting: Internal Medicine

## 2024-08-21 DIAGNOSIS — C3411 Malignant neoplasm of upper lobe, right bronchus or lung: Secondary | ICD-10-CM | POA: Insufficient documentation

## 2024-08-21 DIAGNOSIS — K8020 Calculus of gallbladder without cholecystitis without obstruction: Secondary | ICD-10-CM | POA: Diagnosis not present

## 2024-08-21 DIAGNOSIS — C3490 Malignant neoplasm of unspecified part of unspecified bronchus or lung: Secondary | ICD-10-CM | POA: Diagnosis not present

## 2024-08-21 DIAGNOSIS — M4185 Other forms of scoliosis, thoracolumbar region: Secondary | ICD-10-CM | POA: Diagnosis not present

## 2024-08-21 DIAGNOSIS — E041 Nontoxic single thyroid nodule: Secondary | ICD-10-CM | POA: Insufficient documentation

## 2024-08-21 DIAGNOSIS — I700 Atherosclerosis of aorta: Secondary | ICD-10-CM | POA: Insufficient documentation

## 2024-08-21 DIAGNOSIS — N400 Enlarged prostate without lower urinary tract symptoms: Secondary | ICD-10-CM | POA: Diagnosis not present

## 2024-08-21 LAB — GLUCOSE, CAPILLARY: Glucose-Capillary: 95 mg/dL (ref 70–99)

## 2024-08-21 MED ORDER — FLUDEOXYGLUCOSE F - 18 (FDG) INJECTION
7.1000 | Freq: Once | INTRAVENOUS | Status: AC
  Administered 2024-08-21: 7.05 via INTRAVENOUS

## 2024-08-26 ENCOUNTER — Encounter: Payer: Self-pay | Admitting: Internal Medicine

## 2024-08-28 ENCOUNTER — Inpatient Hospital Stay: Admitting: Internal Medicine

## 2024-08-28 ENCOUNTER — Inpatient Hospital Stay

## 2024-08-28 ENCOUNTER — Inpatient Hospital Stay: Attending: Internal Medicine

## 2024-08-28 ENCOUNTER — Telehealth: Payer: Self-pay | Admitting: Medical Oncology

## 2024-08-28 VITALS — BP 124/72 | HR 81 | Temp 97.9°F | Resp 17 | Ht 65.0 in | Wt 143.1 lb

## 2024-08-28 DIAGNOSIS — Z5112 Encounter for antineoplastic immunotherapy: Secondary | ICD-10-CM | POA: Diagnosis not present

## 2024-08-28 DIAGNOSIS — C3411 Malignant neoplasm of upper lobe, right bronchus or lung: Secondary | ICD-10-CM | POA: Insufficient documentation

## 2024-08-28 DIAGNOSIS — E041 Nontoxic single thyroid nodule: Secondary | ICD-10-CM | POA: Insufficient documentation

## 2024-08-28 DIAGNOSIS — C3491 Malignant neoplasm of unspecified part of right bronchus or lung: Secondary | ICD-10-CM

## 2024-08-28 LAB — CMP (CANCER CENTER ONLY)
ALT: 13 U/L (ref 0–44)
AST: 18 U/L (ref 15–41)
Albumin: 3.8 g/dL (ref 3.5–5.0)
Alkaline Phosphatase: 89 U/L (ref 38–126)
Anion gap: 6 (ref 5–15)
BUN: 19 mg/dL (ref 8–23)
CO2: 28 mmol/L (ref 22–32)
Calcium: 9.5 mg/dL (ref 8.9–10.3)
Chloride: 107 mmol/L (ref 98–111)
Creatinine: 0.99 mg/dL (ref 0.61–1.24)
GFR, Estimated: >60 mL/min (ref >60)
Glucose, Bld: 101 mg/dL — ABNORMAL HIGH (ref 70–99)
Potassium: 4 mmol/L (ref 3.5–5.1)
Sodium: 141 mmol/L (ref 135–145)
Total Bilirubin: 0.5 mg/dL (ref 0.0–1.2)
Total Protein: 7.3 g/dL (ref 6.5–8.1)

## 2024-08-28 LAB — CBC WITH DIFFERENTIAL (CANCER CENTER ONLY)
Abs Immature Granulocytes: 0.04 K/uL (ref 0.00–0.07)
Basophils Absolute: 0 K/uL (ref 0.0–0.1)
Basophils Relative: 0 %
Eosinophils Absolute: 0.2 K/uL (ref 0.0–0.5)
Eosinophils Relative: 3 %
HCT: 37.1 % — ABNORMAL LOW (ref 39.0–52.0)
Hemoglobin: 12.3 g/dL — ABNORMAL LOW (ref 13.0–17.0)
Immature Granulocytes: 1 %
Lymphocytes Relative: 12 %
Lymphs Abs: 0.7 K/uL (ref 0.7–4.0)
MCH: 27.5 pg (ref 26.0–34.0)
MCHC: 33.2 g/dL (ref 30.0–36.0)
MCV: 83 fL (ref 80.0–100.0)
Monocytes Absolute: 0.6 K/uL (ref 0.1–1.0)
Monocytes Relative: 11 %
Neutro Abs: 4.1 K/uL (ref 1.7–7.7)
Neutrophils Relative %: 73 %
Platelet Count: 268 K/uL (ref 150–400)
RBC: 4.47 MIL/uL (ref 4.22–5.81)
RDW: 12.4 % (ref 11.5–15.5)
WBC Count: 5.6 K/uL (ref 4.0–10.5)
nRBC: 0 % (ref 0.0–0.2)

## 2024-08-28 MED ORDER — SODIUM CHLORIDE 0.9 % IV SOLN
350.0000 mg | Freq: Once | INTRAVENOUS | Status: AC
  Administered 2024-08-28: 350 mg via INTRAVENOUS
  Filled 2024-08-28: qty 7

## 2024-08-28 MED ORDER — SODIUM CHLORIDE 0.9% FLUSH: 10.0000 mL | INTRAVENOUS | Status: DC | PRN

## 2024-08-28 MED ORDER — SODIUM CHLORIDE 0.9 % IV SOLN: INTRAVENOUS | Status: DC

## 2024-08-28 NOTE — Patient Instructions (Signed)
 [facility] - A DEPT OF [facility]  Discharge Instructions: Thank you for choosing [facility] to provide your oncology and hematology care.   If you have a lab appointment with the Cancer Center, please go directly to the Cancer Center and check in at the registration area.   Wear comfortable clothing and clothing appropriate for easy access to any Portacath or PICC line.   We strive to give you quality time with your provider. You may need to reschedule your appointment if you arrive late (15 or more minutes).  Arriving late affects you and other patients whose appointments are after yours.  Also, if you miss three or more appointments without notifying the office, you may be dismissed from the clinic at the provider's discretion.      For prescription refill requests, have your pharmacy contact our office and allow 72 hours for refills to be completed.    Today you received the following chemotherapy and/or immunotherapy agents libtayo      To help prevent nausea and vomiting after your treatment, we encourage you to take your nausea medication as directed.  BELOW ARE SYMPTOMS THAT SHOULD BE REPORTED IMMEDIATELY: *FEVER GREATER THAN 100.4 F (38 C) OR HIGHER *CHILLS OR SWEATING *NAUSEA AND VOMITING THAT IS NOT CONTROLLED WITH YOUR NAUSEA MEDICATION *UNUSUAL SHORTNESS OF BREATH *UNUSUAL BRUISING OR BLEEDING *URINARY PROBLEMS (pain or burning when urinating, or frequent urination) *BOWEL PROBLEMS (unusual diarrhea, constipation, pain near the anus) TENDERNESS IN MOUTH AND THROAT WITH OR WITHOUT PRESENCE OF ULCERS (sore throat, sores in mouth, or a toothache) UNUSUAL RASH, SWELLING OR PAIN  UNUSUAL VAGINAL DISCHARGE OR ITCHING   Items with * indicate a potential emergency and should be followed up as soon as possible or go to the Emergency Department if any problems should occur.  Please show the CHEMOTHERAPY ALERT CARD or IMMUNOTHERAPY  ALERT CARD at check-in to the Emergency Department and triage nurse.  Should you have questions after your visit or need to cancel or reschedule your appointment, please contact [facility] - A DEPT OF [facility]  Dept: [phone]  and follow the prompts.  Office hours are 8:00 a.m. to 4:30 p.m. Monday - Friday. Please note that voicemails left after 4:00 p.m. may not be returned until the following business day.  We are closed weekends and major holidays. You have access to a nurse at all times for urgent questions. Please call the main number to the clinic Dept: [phone] and follow the prompts.   For any non-urgent questions, you may also contact your provider using MyChart. We now offer e-Visits for anyone 104 and older to request care online for non-urgent symptoms. For details visit [email or web address].   Also download the MyChart app! Go to the app store, search "MyChart", open the app, select [place], and log in with your MyChart username and password.

## 2024-08-28 NOTE — Telephone Encounter (Signed)
 LVM at  [facility] to refer pt to [facility] or Dr.Julia Rotow.  Called [facility] again and faxed referral , demographics, insurance , imaging, last office note.

## 2024-08-28 NOTE — Progress Notes (Signed)
 [facility] Telephone:([phone]) [phone]   [phone]  OFFICE PROGRESS NOTE  [name], Charlie ORN, MD [address] [place] [address]  DIAGNOSIS:  1) Stage IIIb (T3, N2, M0) non-small cell lung cancer, squamous cell carcinoma presented with 2 separate right upper lobe lung nodule in addition to mediastinal lymphadenopathy diagnosed in January 2024. 2) left thyroid  nodule.  PDL1 expression: 1%   PRIOR THERAPY:  1) A course of concurrent chemoradiation with weekly carboplatin  for AUC of 2 and paclitaxel  45 Mg/M2. Last dose on 02/05/23. Status post 6 cycles. Taxol  changed to abraxane  starting from cycle #4 due to reaction to taxol   2) Consolidation immunotherapy with Imfinzi  1500 mg IV every 4 weeks.  First dose expected on 03/12/23.  Status post 7 cycles. 3) CT-guided pulsed field electroporation of the right suprahilar mass under the care of Dr. Sharron at [facility] on 02/12/2024. 4) Systemic chemotherapy with Carboplatin  for AUC of 5 on day 1, gemcitabine  1000 Mg/M2 on days 1 and 8 as well as Libtayo  (Cempilimab) 350 Mg IV on day 1 every 3 weeks.  First dose March 06, 2024.  Status post 4 cycles.   CURRENT THERAPY: Maintenance treatment with immunotherapy with Libtayo  (Cempilimab) 350 mg IV every 3 weeks status post 4 cycles.  INTERVAL HISTORY: Alan Mendez 83 y.o. male returns to the clinic today for follow-up visit. Discussed the use of AI scribe software for clinical note transcription with the patient, who gave verbal consent to proceed.  History of Present Illness Alan Mendez is an 83 year old male with stage three B non-small cell lung cancer who presents for evaluation and discussion of recent PET scan results. He is accompanied by his wife.  He was diagnosed with stage three B non-small cell lung cancer in January 2024, with PD-L1 expression of 1%. Initial treatment included concurrent chemoradiation with weekly carboplatin  and Abraxane ,  followed by consolidation treatment with durvalumab  every four weeks for seven cycles, which was discontinued due to disease progression. In April 2025, he underwent CT-guided pulsed field electroporation of the right suprahilar mass at [facility].  Following the procedure, he started systemic chemoimmunotherapy with carboplatin , gemcitabine , and Libtayo  every three weeks for four cycles. He has since been on maintenance treatment with Libtayo  every three weeks post four cycles. A recent CT scan showed an increase in the size of the right suprahilar mass.  A recent PET scan indicated growth in the right lung area. He has no significant breathing difficulties. He experiences a persistent cough.  He has a history of high PSA levels and inflammation in the prostate gland, with a follow-up appointment scheduled with his urologist in November. He remains physically active, reporting a 25-mile walk on Saturday and a 15-mile walk on Sunday.  He recently returned from a vacation in [place]  and is considering a trip to [place] in February.     MEDICAL HISTORY: Past Medical History:  Diagnosis Date   Acquired solitary kidney    left --  s/p  right nephroureterctomy 01/ 2018   Anxiety    no current problems   Depression    no current problems   Elevated PSA    Enlarged lymph node 11/2022   enlarged right paratracheal lymph node.   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, negative margins)   History of kidney stones    passed stones and also surgery to remove   History of unilateral nephrectomy    01/ 2018  right nephroureterectomy for renal pelvis mass (carcinoma in situ)   HLD (hyperlipidemia)    on crestor    Hyperplasia of prostate without lower urinary tract symptoms  (LUTS)    Lung cancer (HCC) 12/07/2022   Nephrolithiasis    bilateral non-obstructive   Pneumonia    x 1 - yrs ago   Pulmonary nodules 11/2022   2 right upper lobe pulmonary nodules   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HCC)    Renal cyst, acquired, right    right kidney removed   Wears glasses     ALLERGIES:  is allergic to lamisil [terbinafine] and ivp dye [iodinated contrast media].  MEDICATIONS:  Current Outpatient Medications  Medication Sig Dispense Refill   cholecalciferol  (VITAMIN D3) 25 MCG (1000 UNIT) tablet Take 1,000 Units by mouth daily.     diphenhydrAMINE  (BENADRYL ) 50 MG tablet Take 1 tablet 2 hours before the CT scan. 1 tablet 0   lidocaine -prilocaine  (EMLA ) cream Apply small amount cream to port a cath site 30-60 minutes prior to accessing port 30 g 0   Multiple Vitamins-Minerals (MULTIVITAMIN WITH MINERALS) tablet Take 1 tablet by mouth daily. Centrum     predniSONE  (DELTASONE ) 50 MG tablet 1 tablet 13 hours before then 7 hours before then 2 hours before the CT scan. Please take Benadryl  over-the-counter 50 mg 1 time 2 hours before the scan with the last dose of prednisone . 3 tablet 2   prochlorperazine  (COMPAZINE ) 10 MG tablet Take 10 mg by mouth every 6 (six) hours as needed for nausea or vomiting.     rosuvastatin  (CRESTOR ) 10 MG tablet TAKE 1 TABLET EACH DAY. (Patient taking differently: Take 20 mg by mouth daily.) 30 tablet 0   silver  sulfADIAZINE  (SILVADENE ) 1 % cream Apply 1 Application topically daily. 50 g 0   sucralfate  (CARAFATE ) 1 g tablet Take 1 tablet (1 g total) by mouth 4 (four) times daily -  with meals and at bedtime. Dissolve tablet in water  -swish and swallow. 30 tablet 2   tamsulosin  (FLOMAX ) 0.4 MG CAPS capsule Take 1 capsule (0.4 mg total) by mouth daily. (Patient taking differently: Take 0.4 mg by mouth daily after breakfast.) 30 capsule 5   No current facility-administered medications for this visit.    SURGICAL HISTORY:  Past Surgical  History:  Procedure Laterality Date   APPENDECTOMY  child   BRONCHIAL BIOPSY  12/07/2022   Procedure: BRONCHIAL BIOPSIES;  Surgeon: Brenna Adine CROME, DO;  Location: [facility];  Service: Pulmonary;;   BRONCHIAL BRUSHINGS  12/07/2022   Procedure: BRONCHIAL BRUSHINGS;  Surgeon: Brenna Adine CROME, DO;  Location: [facility];  Service: Pulmonary;;   BRONCHIAL NEEDLE ASPIRATION BIOPSY  12/07/2022   Procedure: BRONCHIAL NEEDLE ASPIRATION BIOPSIES;  Surgeon: Brenna Adine CROME, DO;  Location: [facility];  Service: Pulmonary;;   CATARACT EXTRACTION W/ INTRAOCULAR LENS  IMPLANT, BILATERAL  2017   COLONOSCOPY  08/15/2012   Tubular Adenoma, No high grade dysplasia or malignacy.   CYSTOSCOPY W/ RETROGRADES Right 09/25/2016   Procedure: CYSTOSCOPY, URETHRAL DILITATION  WITH RETROGRADE PYELOGRAM, BRUSH BIOPSIES OF RIGHT RENAL MASS;  Surgeon: Arlena Gal, MD;  Location: [facility];  Service: Urology;  Laterality: Right;   CYSTOSCOPY W/ RETROGRADES Left 03/16/2017   Procedure: CYSTOSCOPY WITH RETROGRADE  PYELOGRAM;  Surgeon: Cam Morene ORN, MD;  Location: [facility];  Service: Urology;  Laterality: Left;   CYSTOSCOPY W/ URETERAL STENT REMOVAL Right 09/25/2016   Procedure: CYSTOSCOPY WITH STENT REMOVAL;  Surgeon: Arlena Gal, MD;  Location: [facility];  Service: Urology;  Laterality: Right;   CYSTOSCOPY WITH RETROGRADE PYELOGRAM, URETEROSCOPY AND STENT PLACEMENT Right 08/21/2016   Procedure: CYSTOSCOPY WITH RIGHT RETROGRADE PYELOGRAM, RIGHT FLEXIBLE AND RIGID URETEROSCOPY, INSERTION DOUBLE J STENT RIGHT;  Surgeon: Arlena Gal, MD;  Location: [facility] ;  Service: Urology;  Laterality: Right;   EXTRACORPOREAL SHOCK WAVE LITHOTRIPSY  2009   IR IMAGING GUIDED PORT INSERTION  07/09/2023   KNEE SURGERY Right 1980's   ROBOT ASSITED LAPAROSCOPIC NEPHROURETERECTOMY Right 11/15/2016   Procedure: RIGHT XI ROBOT ASSITED LAPAROSCOPIC  NEPHROURETERECTOMY;  Surgeon: Morene ORN Cam, MD;  Location: [facility];  Service: Urology;  Laterality: Right;   TRANSURETHRAL RESECTION OF BLADDER TUMOR WITH MITOMYCIN -C Left 03/16/2017   Procedure: CYSTOSCOPY BIOPSIES OF BLADDER TUMOR WITH FULGURATION;  Surgeon: Cam Morene ORN, MD;  Location: [facility];  Service: Urology;  Laterality: Left;   URETEROSCOPY Right 09/25/2016   Procedure: RIGHT URETEROSCOPY;  Surgeon: Arlena Gal, MD;  Location: [facility];  Service: Urology;  Laterality: Right;   VIDEO BRONCHOSCOPY WITH ENDOBRONCHIAL ULTRASOUND Right 12/07/2022   Procedure: VIDEO BRONCHOSCOPY WITH ENDOBRONCHIAL ULTRASOUND;  Surgeon: Brenna Adine CROME, DO;  Location: [facility];  Service: Pulmonary;  Laterality: Right;    REVIEW OF SYSTEMS:  Constitutional: negative Eyes: negative Ears, nose, mouth, throat, and face: positive for sore throat Respiratory: positive for cough Cardiovascular: negative Gastrointestinal: negative Genitourinary:negative Integument/breast: negative Hematologic/lymphatic: negative Musculoskeletal:negative Neurological: negative Behavioral/Psych: negative Endocrine: negative Allergic/Immunologic: negative   PHYSICAL EXAMINATION: General appearance: alert, cooperative, and no distress Head: Normocephalic, without obvious abnormality, atraumatic Neck: no adenopathy, no JVD, supple, symmetrical, trachea midline, and thyroid  not enlarged, symmetric, no tenderness/mass/nodules Lymph nodes: Cervical, supraclavicular, and axillary nodes normal. Resp: clear to auscultation bilaterally Back: symmetric, no curvature. ROM normal. No CVA tenderness. Cardio: regular rate and rhythm, S1, S2 normal, no murmur, click, rub or gallop GI: soft, non-tender; bowel sounds normal; no masses,  no organomegaly Extremities: extremities normal, atraumatic, no cyanosis or edema Neurologic: Alert and oriented X 3, normal strength and tone. Normal  symmetric reflexes. Normal coordination and gait  ECOG PERFORMANCE STATUS: 1 - Symptomatic but completely ambulatory  Blood pressure 124/72, pulse 81, temperature 97.9 F (36.6 C), resp. rate 17, height 5' 5 (1.651 m), weight 143 lb 1.6 oz (64.9 kg), SpO2 96%.  LABORATORY DATA: Lab Results  Component Value Date   WBC 7.9 08/07/2024   HGB 12.5 (L) 08/07/2024   HCT 36.8 (L) 08/07/2024   MCV 85.2 08/07/2024   PLT 247 08/07/2024      Chemistry      Component Value Date/Time   NA 137 08/07/2024 1048   K 3.8 08/07/2024 1048   CL 105 08/07/2024 1048   CO2 28 08/07/2024 1048   BUN 23 08/07/2024 1048   CREATININE 0.98 08/07/2024 1048   CREATININE 0.85 12/10/2014 1550      Component Value Date/Time   CALCIUM  8.8 (L) 08/07/2024 1048   ALKPHOS 96 08/07/2024 1048   AST 16 08/07/2024 1048   ALT 13 08/07/2024 1048   BILITOT 0.4 08/07/2024 1048       RADIOGRAPHIC STUDIES: NM PET Image Restage (PS) Skull Base to Thigh (F-18 FDG) Result Date: 08/22/2024 CLINICAL DATA:  Subsequent treatment  strategy for non-small cell lung cancer. EXAM: NUCLEAR MEDICINE PET SKULL BASE TO THIGH TECHNIQUE: 7.1 mCi F-18 FDG was injected intravenously. Full-ring PET imaging was performed from the skull base to thigh after the radiotracer. CT data was obtained and used for attenuation correction and anatomic localization. Fasting blood glucose: 95 mg/dl COMPARISON:  89/70/7975 FINDINGS: Mediastinal blood pool activity: SUV max 2.1 Liver activity: SUV max NA NECK: 1.5 cm hypodense left thyroid  nodule with maximum SUV 18.6, formerly 18.8. This was biopsied on 08/22/2023 revealing atypia and repeat sampling after observation was suggested. Consider repeat FNA given the high activity in this lesion. Incidental CT findings: Subcutaneous lesion measuring 0.9 by 1.5 cm on image 11 series 4 is relatively stable, maximum SUV in this region similar to blood pool, this could be a benign occipital lymph node. CHEST: Right  suprahilar/paramediastinal consolidation involving both the upper lobe and the superior segment lower lobe with a 4.4 by 2.6 cm masslike region of hypermetabolic activity within this consolidation posteriorly, maximum SUV 22.4. This had a slightly different configuration previously with maximum SUV of 16.4. Surrounding consolidation likely related to prior radiation therapy. Narrowing of the adjacent right upper lobe bronchus. Incidental CT findings: Right Port-A-Cath tip: Right atrium. Aortic and branch vessel atheromatous vascular calcifications. ABDOMEN/PELVIS: New hypermetabolic focus in the prostate gland left posterolateral peripheral zone in the mid gland, maximum SUV 6.7. New hypermetabolic focus in the prostate gland left posteromedial peripheral zone at the apex, maximum SUV 8.0. Although these could be inflammatory, prostate cancer is a possibility. Correlate with PSA levels and consider urology referral. Prostatomegaly noted. Incidental CT findings: Cholelithiasis. Suspected cyst in segment 4a of the liver. Absent right kidney. Photopenic fluid density left kidney upper pole lesion compatible with cyst warranting no further imaging workup. Atherosclerosis is present, including aortoiliac atherosclerotic disease. Prominent stool throughout the colon favors constipation. SKELETON: No significant abnormal hypermetabolic activity in this region. Incidental CT findings: Dextroconvex thoracic and levoconvex lumbar scoliosis with rotary components. Moderate degenerative hip arthropathy bilaterally. Sharply defined lucent lesion in the upper left iliac bone has been present since 2007 and is considered benign. IMPRESSION: 1. Right suprahilar/paramediastinal consolidation with a 4.4 by 2.6 cm masslike region of hypermetabolic activity within this consolidation posteriorly, maximum SUV 22.4. This had a slightly different configuration previously with maximum SUV of 16.4. Appearance favors active malignancy. 2. Two  new hypermetabolic foci in the left prostate peripheral zone. Although these could be inflammatory, prostate cancer is a possibility. Correlate with PSA levels and consider Urology referral. Prostatomegaly also noted. 3. 1.5 cm hypodense left thyroid  nodule with maximum SUV 18.6, formerly 18.8. This was biopsied on 08/22/2023 revealing atypia and repeat sampling after observation was suggested in the pathology report. Consider repeat FNA given the high activity in this lesion. 4. Cholelithiasis. 5. Prominent stool throughout the colon favors constipation. 6. Dextroconvex thoracic and levoconvex lumbar scoliosis with rotary components. 7. Moderate degenerative hip arthropathy bilaterally. 8.  Aortic Atherosclerosis (ICD10-I70.0). Electronically Signed   By: Ryan Salvage M.D.   On: 08/22/2024 13:34   CT Chest W Contrast Result Date: 08/06/2024 CLINICAL DATA:  Non-small cell lung cancer, staging. * Tracking Code: BO * EXAM: CT CHEST WITH CONTRAST TECHNIQUE: Multidetector CT imaging of the chest was performed during intravenous contrast administration. RADIATION DOSE REDUCTION: This exam was performed according to the departmental dose-optimization program which includes automated exposure control, adjustment of the mA and/or kV according to patient size and/or use of iterative reconstruction technique. CONTRAST:  75mL OMNIPAQUE   IOHEXOL  300 MG/ML  SOLN COMPARISON:  Chest CT 04/30/2024 and 08/21/2023.  PET-CT 09/11/2023. FINDINGS: Cardiovascular: No acute vascular findings. Right IJ Port-A-Cath extends to the superior cavoatrial junction. There is atherosclerosis of the aorta, great vessels and coronary arteries. Mild calcifications of the aortic valve. The heart size is normal. There is no pericardial effusion. Mediastinum/Nodes: Small mediastinal lymph nodes appear stable, including a retrotracheal node which is difficult to differentiate from the adjacent esophagus, although was not hypermetabolic on prior  PET-CT. There are no enlarged axillary lymph nodes. The thyroid  gland, trachea and esophagus demonstrate no significant findings. Lungs/Pleura: No pleural effusion or pneumothorax. Treatment changes in the right perihilar region are again noted with associated architectural distortion, volume loss and bronchiectasis. At the site of the previously demonstrated hypermetabolic mass medially in the right upper lobe, there is slightly increased mass-like density, measuring up to 3.6 x 2.3 cm transverse on image 33/2 (previously 2.4 x 1.6 cm). This measures up to 4.0 cm on sagittal image 65/8. Apart from this, there are no new or enlarging pulmonary nodules. Upper abdomen: No acute or suspicious findings are seen in the visualized upper abdomen. There is a calcified gallstone, cyst in the left hepatic lobe, surgical changes from previous right nephrectomy and a stable cystic appearing lesion in the left kidney for which no specific follow-up imaging is recommended. Musculoskeletal/Chest wall: There is no chest wall mass or suspicious osseous finding. Moderate thoracolumbar scoliosis again noted. IMPRESSION: 1. Interval increased mass-like density in the right upper lobe at the site of the previously demonstrated hypermetabolic mass. While this could reflect treatment change, local progression of disease is difficult to exclude. Recommend attention on short-term follow-up chest CT or repeat PET-CT. 2. No evidence of metastatic disease. Stable small mediastinal lymph nodes, likely reactive. 3. Stable additional incidental findings, including cholelithiasis. 4.  Aortic Atherosclerosis (ICD10-I70.0). Electronically Signed   By: Elsie Perone M.D.   On: 08/06/2024 11:43     ASSESSMENT AND PLAN: This is a very pleasant 83 years old white male with Stage IIIb (T3, N2, M0) non-small cell lung cancer, squamous cell carcinoma presented with 2 separate right upper lobe lung nodule in addition to mediastinal lymphadenopathy  diagnosed in January 2024. The patient has no evidence of metastatic disease to the brain or extrathoracic metastasis besides the 2 right upper lobe pulmonary nodule and mediastinal lymphadenopathy. I discussed his case with Dr. Kerrin, cardiothoracic surgery and he indicated that because of the location of the lymph node, the patient will not be a great candidate for surgical resection with negative margin. The patient underwent a course of concurrent chemoradiation with weekly carboplatin  for AUC of 2 and paclitaxel  45 Mg/M2.  Status post 6 cycles.  His treatment with paclitaxel  was changed to Abraxane  starting from cycle #3 with secondary to hypersensitivity reaction. He tolerated this treatment fairly well with partial response. He underwent consolidation treatment with immunotherapy with Imfinzi  1500 Mg IV every 4 weeks status post 7 cycles.  This treatment was discontinued secondary to disease progression. The patient was seen by thoracic surgery at [facility] for consideration of surgical resection but he was not a surgical candidate. The patient underwent pulsed electric field therapy to the right suprahilar mass at [facility]. He underwent systemic chemotherapy with carboplatin  for AUC of 5 on day 1, gemcitabine  1000 Mg/M2 on days 1 and 8 in addition to Libtayo  (Cempilimab) 350 Mg IV on day 1 every 3 weeks.  First dose 03/06/2024.  Status post 4  cycles of treatment. The patient is currently on maintenance treatment with single agent Libtayo  (Cempilimab) 350 mg IV every 3 weeks status post 3 cycles. He continues to tolerate his treatment fairly well except for mild fatigue. Unfortunately his scan showed increase in the size of the masslike density in the right upper lobe at the site of the previously demonstrated hypermetabolic mass.  He had repeat PET scan that showed hypermetabolic activity in the right suprahilar/paramediastinal consolidation with front configuration favoring  active malignancy. I personally independently reviewed the PET scan images and discussed the result and showed the images to the patient and his wife today.   Assessment and Plan Assessment & Plan Stage IIIB non-small cell lung cancer, right suprahilar mass Recent PET scan indicates growth in the right suprahilar mass without metastasis. The cancer is growing but not rapidly. Current treatment includes maintenance with Libtayo  (cemiplimab ) every three weeks. The possibility of repeating a local procedure, such as pulsed field electroporation, is being explored with Dr. Sharron at [facility]. If local treatment is not feasible, more aggressive systemic chemotherapy with docetaxel and ramucirumab may be considered. He is advised against traveling to [place] due to potential immune system compromise during winter. A second opinion at [facility] is recommended to explore additional treatment options. - Continue Libtayo  (cemiplimab ) every three weeks. - Consult with Dr. Sharron, IR at [facility] regarding potential local treatment options. - Advise against travel to [place] during winter. - Refer to [employer] for a second opinion.  Cough due to lung cancer and prior radiation Persistent cough likely due to prior radiation and irritation from the lung mass. No evidence of cancer spreading to the airways. Oxygen saturation remains at 96% on room air. - Monitor respiratory symptoms and oxygen saturation.  The patient voices understanding of current disease status and treatment options and is in agreement with the current care plan.  All questions were answered. The patient knows to call the clinic with any problems, questions or concerns. We can certainly see the patient much sooner if necessary.  The total time spent in the appointment was 30 minutes.  Disclaimer: This note was dictated with voice recognition software. Similar  sounding words can inadvertently be transcribed and may not be corrected upon review.

## 2024-09-02 ENCOUNTER — Telehealth: Payer: Self-pay

## 2024-09-02 ENCOUNTER — Telehealth: Payer: Self-pay | Admitting: Medical Oncology

## 2024-09-02 NOTE — Telephone Encounter (Signed)
 Faxed genomics and path report to [facility] referral intake.

## 2024-09-02 NOTE — Telephone Encounter (Signed)
 Spoke with Sabrina from [facility] where the patient was referred to for a second opinion.  Spoke with Sabrina about the intake information for the referral. Informed her to call the patient with an appointment. She voiced understanding.

## 2024-09-03 ENCOUNTER — Encounter: Payer: Self-pay | Admitting: *Deleted

## 2024-09-03 ENCOUNTER — Other Ambulatory Visit: Payer: Self-pay

## 2024-09-04 DIAGNOSIS — C3491 Malignant neoplasm of unspecified part of right bronchus or lung: Secondary | ICD-10-CM | POA: Diagnosis not present

## 2024-09-09 DIAGNOSIS — Z8551 Personal history of malignant neoplasm of bladder: Secondary | ICD-10-CM | POA: Diagnosis not present

## 2024-09-09 DIAGNOSIS — R972 Elevated prostate specific antigen [PSA]: Secondary | ICD-10-CM | POA: Diagnosis not present

## 2024-09-10 ENCOUNTER — Other Ambulatory Visit: Payer: Self-pay | Admitting: Urology

## 2024-09-10 DIAGNOSIS — R972 Elevated prostate specific antigen [PSA]: Secondary | ICD-10-CM

## 2024-09-11 ENCOUNTER — Encounter: Payer: Self-pay | Admitting: Internal Medicine

## 2024-09-18 ENCOUNTER — Inpatient Hospital Stay: Attending: Internal Medicine

## 2024-09-18 ENCOUNTER — Ambulatory Visit: Admitting: Internal Medicine

## 2024-09-18 ENCOUNTER — Other Ambulatory Visit

## 2024-09-18 ENCOUNTER — Inpatient Hospital Stay: Admitting: Internal Medicine

## 2024-09-18 ENCOUNTER — Inpatient Hospital Stay

## 2024-09-18 ENCOUNTER — Other Ambulatory Visit: Payer: Self-pay | Admitting: *Deleted

## 2024-09-18 VITALS — BP 106/71 | HR 88 | Temp 98.2°F | Resp 17 | Ht 65.0 in | Wt 140.0 lb

## 2024-09-18 DIAGNOSIS — C3490 Malignant neoplasm of unspecified part of unspecified bronchus or lung: Secondary | ICD-10-CM

## 2024-09-18 DIAGNOSIS — Z79620 Long term (current) use of immunosuppressive biologic: Secondary | ICD-10-CM | POA: Insufficient documentation

## 2024-09-18 DIAGNOSIS — D539 Nutritional anemia, unspecified: Secondary | ICD-10-CM

## 2024-09-18 DIAGNOSIS — C3491 Malignant neoplasm of unspecified part of right bronchus or lung: Secondary | ICD-10-CM

## 2024-09-18 DIAGNOSIS — C3411 Malignant neoplasm of upper lobe, right bronchus or lung: Secondary | ICD-10-CM | POA: Diagnosis present

## 2024-09-18 DIAGNOSIS — J949 Pleural condition, unspecified: Secondary | ICD-10-CM

## 2024-09-18 DIAGNOSIS — Z5112 Encounter for antineoplastic immunotherapy: Secondary | ICD-10-CM | POA: Insufficient documentation

## 2024-09-18 DIAGNOSIS — R1310 Dysphagia, unspecified: Secondary | ICD-10-CM | POA: Diagnosis not present

## 2024-09-18 DIAGNOSIS — R5382 Chronic fatigue, unspecified: Secondary | ICD-10-CM

## 2024-09-18 DIAGNOSIS — E041 Nontoxic single thyroid nodule: Secondary | ICD-10-CM | POA: Diagnosis not present

## 2024-09-18 LAB — CBC WITH DIFFERENTIAL (CANCER CENTER ONLY)
Abs Immature Granulocytes: 0.03 K/uL (ref 0.00–0.07)
Basophils Absolute: 0 K/uL (ref 0.0–0.1)
Basophils Relative: 0 %
Eosinophils Absolute: 0.2 K/uL (ref 0.0–0.5)
Eosinophils Relative: 2 %
HCT: 36.6 % — ABNORMAL LOW (ref 39.0–52.0)
Hemoglobin: 12.1 g/dL — ABNORMAL LOW (ref 13.0–17.0)
Immature Granulocytes: 1 %
Lymphocytes Relative: 10 %
Lymphs Abs: 0.6 K/uL — ABNORMAL LOW (ref 0.7–4.0)
MCH: 27 pg (ref 26.0–34.0)
MCHC: 33.1 g/dL (ref 30.0–36.0)
MCV: 81.7 fL (ref 80.0–100.0)
Monocytes Absolute: 0.5 K/uL (ref 0.1–1.0)
Monocytes Relative: 8 %
Neutro Abs: 5 K/uL (ref 1.7–7.7)
Neutrophils Relative %: 79 %
Platelet Count: 255 K/uL (ref 150–400)
RBC: 4.48 MIL/uL (ref 4.22–5.81)
RDW: 12.9 % (ref 11.5–15.5)
WBC Count: 6.3 K/uL (ref 4.0–10.5)
nRBC: 0 % (ref 0.0–0.2)

## 2024-09-18 LAB — CMP (CANCER CENTER ONLY)
ALT: 14 U/L (ref 0–44)
AST: 21 U/L (ref 15–41)
Albumin: 3.8 g/dL (ref 3.5–5.0)
Alkaline Phosphatase: 91 U/L (ref 38–126)
Anion gap: 6 (ref 5–15)
BUN: 22 mg/dL (ref 8–23)
CO2: 26 mmol/L (ref 22–32)
Calcium: 8.9 mg/dL (ref 8.9–10.3)
Chloride: 107 mmol/L (ref 98–111)
Creatinine: 1.1 mg/dL (ref 0.61–1.24)
GFR, Estimated: >60 mL/min (ref >60)
Glucose, Bld: 176 mg/dL — ABNORMAL HIGH (ref 70–99)
Potassium: 4 mmol/L (ref 3.5–5.1)
Sodium: 139 mmol/L (ref 135–145)
Total Bilirubin: 0.6 mg/dL (ref 0.0–1.2)
Total Protein: 7.3 g/dL (ref 6.5–8.1)

## 2024-09-18 LAB — TSH: TSH: 2.34 u[IU]/mL (ref 0.350–4.500)

## 2024-09-18 MED ORDER — SODIUM CHLORIDE 0.9 % IV SOLN
350.0000 mg | Freq: Once | INTRAVENOUS | Status: AC
  Administered 2024-09-18: 350 mg via INTRAVENOUS
  Filled 2024-09-18: qty 7

## 2024-09-18 MED ORDER — SODIUM CHLORIDE 0.9 % IV SOLN: INTRAVENOUS | Status: DC

## 2024-09-18 NOTE — Progress Notes (Signed)
 [facility] [phone]   [phone]  OFFICE PROGRESS NOTE  [name], Charlie ORN, MD [address] [place] [address]  DIAGNOSIS:  1) Stage IIIb (T3, N2, M0) non-small cell lung cancer, squamous cell carcinoma presented with 2 separate right upper lobe lung nodule in addition to mediastinal lymphadenopathy diagnosed in January 2024. 2) left thyroid  nodule.  PDL1 expression: 1%   PRIOR THERAPY:  1) A course of concurrent chemoradiation with weekly carboplatin  for AUC of 2 and paclitaxel  45 Mg/M2. Last dose on 02/05/23. Status post 6 cycles. Taxol  changed to abraxane  starting from cycle #4 due to reaction to taxol   2) Consolidation immunotherapy with Imfinzi  1500 mg IV every 4 weeks.  First dose expected on 03/12/23.  Status post 7 cycles. 3) CT-guided pulsed field electroporation of the right suprahilar mass under the care of Dr. Sharron at [facility] on 02/12/2024. 4) Systemic chemotherapy with Carboplatin  for AUC of 5 on day 1, gemcitabine  1000 Mg/M2 on days 1 and 8 as well as Libtayo  (Cempilimab) 350 Mg IV on day 1 every 3 weeks.  First dose March 06, 2024.  Status post 4 cycles.   CURRENT THERAPY: Maintenance treatment with immunotherapy with Libtayo  (Cempilimab) 350 mg IV every 3 weeks status post 5 cycles.  INTERVAL HISTORY: RUSS LOOPER 83 y.o. male returns to the clinic today for follow-up visit.  Discussed the use of AI scribe software for clinical note transcription with the patient, who gave verbal consent to proceed.  History of Present Illness PACEN WATFORD is an 83 year old male with stage IIIb non-small cell lung cancer who presents for evaluation before starting cycle number six of maintenance treatment.  He has a history of stage IIIb non-small cell lung cancer, specifically conus squamous cell carcinoma, diagnosed in January 2024 with PD-L1 expression of one percent. Initially, he underwent concurrent chemoradiation  with weekly carboplatin  and paclitaxel , which was later changed to Abraxane  due to intolerance. Following this, he completed seven cycles of consolidation treatment with durvalumab , which was discontinued due to disease progression. Subsequently, he underwent CT-guided pulsed field electroporation of the right suprahilar progressive mass, followed by systemic chemotherapy with carboplatin  and gemcitabine  and Libtayo  (Cempilimab) every three weeks for four cycles. He is currently on maintenance treatment with single-agent Libtayo  every three weeks and is here for evaluation before starting cycle number six.  He experiences a sensation of something 'stuck in my throat,' which has been bothersome, especially after an incident a couple of weeks ago where he swallowed vitamins incorrectly, causing a choking sensation. He has been more cautious about his eating since then. No chest pain, shortness of breath, or palpitations.  He reports that his IR doctor have told him the cancer is still active and has grown since the last scan, but it remains smaller than before the initial treatment. He recalls being told that the cancer is localized to the right paratracheal area and has not spread to other organs like the liver, bone, or brain.  He has decided to cancel a trip to [place] due to concerns about exposure to strange viruses and has opted to meet his son in [place] instead. He wants to 'live my life now' and is trying to maintain a sense of normalcy despite his condition.   MEDICAL HISTORY: Past Medical History:  Diagnosis Date   Acquired solitary kidney    left --  s/p  right nephroureterctomy 01/ 2018   Anxiety  no current problems   Depression    no current problems   Elevated PSA    Enlarged lymph node 11/2022   enlarged right paratracheal lymph node.   History of adenomatous polyp of colon    tubular adenoma 2013   History of kidney cancer urologist-  dr cam   dx 11/ 2017--  11-15-2016   s/p  right nephoureterectomy (per path report-- low grade papillay urothelial carcinoma in situ involving renal pelvis, negative margins)   History of kidney stones    passed stones and also surgery to remove   History of unilateral nephrectomy    01/ 2018  right nephroureterectomy for renal pelvis mass (carcinoma in situ)   HLD (hyperlipidemia)    on crestor    Hyperplasia of prostate without lower urinary tract symptoms (LUTS)    Lung cancer (HCC) 12/07/2022   Nephrolithiasis    bilateral non-obstructive   Pneumonia    x 1 - yrs ago   Pulmonary nodules 11/2022   2 right upper lobe pulmonary nodules   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HCC)    Renal cyst, acquired, right    right kidney removed   Wears glasses     ALLERGIES:  is allergic to lamisil [terbinafine] and ivp dye [iodinated contrast media].  MEDICATIONS:  Current Outpatient Medications  Medication Sig Dispense Refill   cholecalciferol  (VITAMIN D3) 25 MCG (1000 UNIT) tablet Take 1,000 Units by mouth daily.     diphenhydrAMINE  (BENADRYL ) 50 MG tablet Take 1 tablet 2 hours before the CT scan. 1 tablet 0   lidocaine -prilocaine  (EMLA ) cream Apply small amount cream to port a cath site 30-60 minutes prior to accessing port 30 g 0   Multiple Vitamins-Minerals (MULTIVITAMIN WITH MINERALS) tablet Take 1 tablet by mouth daily. Centrum     predniSONE  (DELTASONE ) 50 MG tablet 1 tablet 13 hours before then 7 hours before then 2 hours before the CT scan. Please take Benadryl  over-the-counter 50 mg 1 time 2 hours before the scan with the last dose of prednisone . 3 tablet 2   prochlorperazine  (COMPAZINE ) 10 MG tablet Take 10 mg by mouth every 6 (six) hours as needed for nausea or vomiting.     rosuvastatin  (CRESTOR ) 10 MG tablet TAKE 1 TABLET EACH DAY. (Patient taking differently: Take 20 mg by mouth daily.) 30 tablet 0   silver  sulfADIAZINE  (SILVADENE ) 1 % cream Apply 1 Application topically daily. 50 g 0   sucralfate  (CARAFATE ) 1  g tablet Take 1 tablet (1 g total) by mouth 4 (four) times daily -  with meals and at bedtime. Dissolve tablet in water  -swish and swallow. 30 tablet 2   tamsulosin  (FLOMAX ) 0.4 MG CAPS capsule Take 1 capsule (0.4 mg total) by mouth daily. (Patient taking differently: Take 0.4 mg by mouth daily after breakfast.) 30 capsule 5   No current facility-administered medications for this visit.    SURGICAL HISTORY:  Past Surgical History:  Procedure Laterality Date   APPENDECTOMY  child   BRONCHIAL BIOPSY  12/07/2022   Procedure: BRONCHIAL BIOPSIES;  Surgeon: Brenna Adine CROME, DO;  Location: [facility];  Service: Pulmonary;;   BRONCHIAL BRUSHINGS  12/07/2022   Procedure: BRONCHIAL BRUSHINGS;  Surgeon: Brenna Adine CROME, DO;  Location: [facility];  Service: Pulmonary;;   BRONCHIAL NEEDLE ASPIRATION BIOPSY  12/07/2022   Procedure: BRONCHIAL NEEDLE ASPIRATION BIOPSIES;  Surgeon: Brenna Adine CROME, DO;  Location: [facility];  Service: Pulmonary;;   CATARACT EXTRACTION W/ INTRAOCULAR LENS  IMPLANT, BILATERAL  2017  COLONOSCOPY  08/15/2012   Tubular Adenoma, No high grade dysplasia or malignacy.   CYSTOSCOPY W/ RETROGRADES Right 09/25/2016   Procedure: CYSTOSCOPY, URETHRAL DILITATION  WITH RETROGRADE PYELOGRAM, BRUSH BIOPSIES OF RIGHT RENAL MASS;  Surgeon: Arlena Gal, MD;  Location[facility];  Service: Urology;  Laterality: Right;   CYSTOSCOPY W/ RETROGRADES Left 03/16/2017   Procedure: CYSTOSCOPY WITH RETROGRADE PYELOGRAM;  Surgeon: Cam Morene ORN, MD;  Location: [facility];  Service: Urology;  Laterality: Left;   CYSTOSCOPY W/ URETERAL STENT REMOVAL Right 09/25/2016   Procedure: CYSTOSCOPY WITH STENT REMOVAL;  Surgeon: Arlena Gal, MD;  Location: [facility];  Service: Urology;  Laterality: Right;   CYSTOSCOPY WITH RETROGRADE PYELOGRAM, URETEROSCOPY AND STENT PLACEMENT Right 08/21/2016   Procedure: CYSTOSCOPY WITH RIGHT RETROGRADE  PYELOGRAM, RIGHT FLEXIBLE AND RIGID URETEROSCOPY, INSERTION DOUBLE J STENT RIGHT;  Surgeon: Arlena Gal, MD;  Location: [facility] ;  Service: Urology;  Laterality: Right;   EXTRACORPOREAL SHOCK WAVE LITHOTRIPSY  2009   IR IMAGING GUIDED PORT INSERTION  07/09/2023   KNEE SURGERY Right 1980's   ROBOT ASSITED LAPAROSCOPIC NEPHROURETERECTOMY Right 11/15/2016   Procedure: RIGHT XI ROBOT ASSITED LAPAROSCOPIC NEPHROURETERECTOMY;  Surgeon: Morene ORN Cam, MD;  Location: [facility];  Service: Urology;  Laterality: Right;   TRANSURETHRAL RESECTION OF BLADDER TUMOR WITH MITOMYCIN -C Left 03/16/2017   Procedure: CYSTOSCOPY BIOPSIES OF BLADDER TUMOR WITH FULGURATION;  Surgeon: Cam Morene ORN, MD;  Location: [facility];  Service: Urology;  Laterality: Left;   URETEROSCOPY Right 09/25/2016   Procedure: RIGHT URETEROSCOPY;  Surgeon: Arlena Gal, MD;  Location: [facility];  Service: Urology;  Laterality: Right;   VIDEO BRONCHOSCOPY WITH ENDOBRONCHIAL ULTRASOUND Right 12/07/2022   Procedure: VIDEO BRONCHOSCOPY WITH ENDOBRONCHIAL ULTRASOUND;  Surgeon: Brenna Adine CROME, DO;  Location: [facility];  Service: Pulmonary;  Laterality: Right;    REVIEW OF SYSTEMS:  A comprehensive review of systems was negative except for: Ears, nose, mouth, throat, and face: positive for sore throat   PHYSICAL EXAMINATION: General appearance: alert, cooperative, and no distress Head: Normocephalic, without obvious abnormality, atraumatic Neck: no adenopathy, no JVD, supple, symmetrical, trachea midline, and thyroid  not enlarged, symmetric, no tenderness/mass/nodules Lymph nodes: Cervical, supraclavicular, and axillary nodes normal. Resp: clear to auscultation bilaterally Back: symmetric, no curvature. ROM normal. No CVA tenderness. Cardio: regular rate and rhythm, S1, S2 normal, no murmur, click, rub or gallop GI: soft, non-tender; bowel sounds normal; no masses,  no  organomegaly Extremities: extremities normal, atraumatic, no cyanosis or edema  ECOG PERFORMANCE STATUS: 1 - Symptomatic but completely ambulatory  Blood pressure 106/71, pulse 88, temperature 98.2 F (36.8 C), temperature source Temporal, resp. rate 17, height 5' 5 (1.651 m), weight 140 lb (63.5 kg), SpO2 100%.  LABORATORY DATA: Lab Results  Component Value Date   WBC 6.3 09/18/2024   HGB 12.1 (L) 09/18/2024   HCT 36.6 (L) 09/18/2024   MCV 81.7 09/18/2024   PLT 255 09/18/2024      Chemistry      Component Value Date/Time   NA 139 09/18/2024 1035   K 4.0 09/18/2024 1035   CL 107 09/18/2024 1035   CO2 26 09/18/2024 1035   BUN 22 09/18/2024 1035   CREATININE 1.10 09/18/2024 1035   CREATININE 0.85 12/10/2014 1550      Component Value Date/Time   CALCIUM  8.9 09/18/2024 1035   ALKPHOS 91 09/18/2024 1035   AST 21 09/18/2024 1035   ALT 14 09/18/2024 1035  BILITOT 0.6 09/18/2024 1035       RADIOGRAPHIC STUDIES: NM PET Image Restage (PS) Skull Base to Thigh (F-18 FDG) Result Date: 08/22/2024 CLINICAL DATA:  Subsequent treatment strategy for non-small cell lung cancer. EXAM: NUCLEAR MEDICINE PET SKULL BASE TO THIGH TECHNIQUE: 7.1 mCi F-18 FDG was injected intravenously. Full-ring PET imaging was performed from the skull base to thigh after the radiotracer. CT data was obtained and used for attenuation correction and anatomic localization. Fasting blood glucose: 95 mg/dl COMPARISON:  89/70/7975 FINDINGS: Mediastinal blood pool activity: SUV max 2.1 Liver activity: SUV max NA NECK: 1.5 cm hypodense left thyroid  nodule with maximum SUV 18.6, formerly 18.8. This was biopsied on 08/22/2023 revealing atypia and repeat sampling after observation was suggested. Consider repeat FNA given the high activity in this lesion. Incidental CT findings: Subcutaneous lesion measuring 0.9 by 1.5 cm on image 11 series 4 is relatively stable, maximum SUV in this region similar to blood pool, this could be  a benign occipital lymph node. CHEST: Right suprahilar/paramediastinal consolidation involving both the upper lobe and the superior segment lower lobe with a 4.4 by 2.6 cm masslike region of hypermetabolic activity within this consolidation posteriorly, maximum SUV 22.4. This had a slightly different configuration previously with maximum SUV of 16.4. Surrounding consolidation likely related to prior radiation therapy. Narrowing of the adjacent right upper lobe bronchus. Incidental CT findings: Right Port-A-Cath tip: Right atrium. Aortic and branch vessel atheromatous vascular calcifications. ABDOMEN/PELVIS: New hypermetabolic focus in the prostate gland left posterolateral peripheral zone in the mid gland, maximum SUV 6.7. New hypermetabolic focus in the prostate gland left posteromedial peripheral zone at the apex, maximum SUV 8.0. Although these could be inflammatory, prostate cancer is a possibility. Correlate with PSA levels and consider urology referral. Prostatomegaly noted. Incidental CT findings: Cholelithiasis. Suspected cyst in segment 4a of the liver. Absent right kidney. Photopenic fluid density left kidney upper pole lesion compatible with cyst warranting no further imaging workup. Atherosclerosis is present, including aortoiliac atherosclerotic disease. Prominent stool throughout the colon favors constipation. SKELETON: No significant abnormal hypermetabolic activity in this region. Incidental CT findings: Dextroconvex thoracic and levoconvex lumbar scoliosis with rotary components. Moderate degenerative hip arthropathy bilaterally. Sharply defined lucent lesion in the upper left iliac bone has been present since 2007 and is considered benign. IMPRESSION: 1. Right suprahilar/paramediastinal consolidation with a 4.4 by 2.6 cm masslike region of hypermetabolic activity within this consolidation posteriorly, maximum SUV 22.4. This had a slightly different configuration previously with maximum SUV of 16.4.  Appearance favors active malignancy. 2. Two new hypermetabolic foci in the left prostate peripheral zone. Although these could be inflammatory, prostate cancer is a possibility. Correlate with PSA levels and consider Urology referral. Prostatomegaly also noted. 3. 1.5 cm hypodense left thyroid  nodule with maximum SUV 18.6, formerly 18.8. This was biopsied on 08/22/2023 revealing atypia and repeat sampling after observation was suggested in the pathology report. Consider repeat FNA given the high activity in this lesion. 4. Cholelithiasis. 5. Prominent stool throughout the colon favors constipation. 6. Dextroconvex thoracic and levoconvex lumbar scoliosis with rotary components. 7. Moderate degenerative hip arthropathy bilaterally. 8.  Aortic Atherosclerosis (ICD10-I70.0). Electronically Signed   By: Ryan Salvage M.D.   On: 08/22/2024 13:34     ASSESSMENT AND PLAN: This is a very pleasant 83 years old white male with Stage IIIb (T3, N2, M0) non-small cell lung cancer, squamous cell carcinoma presented with 2 separate right upper lobe lung nodule in addition to mediastinal lymphadenopathy diagnosed in January  2024. The patient has no evidence of metastatic disease to the brain or extrathoracic metastasis besides the 2 right upper lobe pulmonary nodule and mediastinal lymphadenopathy. I discussed his case with Dr. Kerrin, cardiothoracic surgery and he indicated that because of the location of the lymph node, the patient will not be a great candidate for surgical resection with negative margin. The patient underwent a course of concurrent chemoradiation with weekly carboplatin  for AUC of 2 and paclitaxel  45 Mg/M2.  Status post 6 cycles.  His treatment with paclitaxel  was changed to Abraxane  starting from cycle #3 with secondary to hypersensitivity reaction. He tolerated this treatment fairly well with partial response. He underwent consolidation treatment with immunotherapy with Imfinzi  1500 Mg IV  every 4 weeks status post 7 cycles.  This treatment was discontinued secondary to disease progression. The patient was seen by thoracic surgery at [facility] for consideration of surgical resection but he was not a surgical candidate. The patient underwent pulsed electric field therapy to the right suprahilar mass at [facility]. He underwent systemic chemotherapy with carboplatin  for AUC of 5 on day 1, gemcitabine  1000 Mg/M2 on days 1 and 8 in addition to Libtayo  (Cempilimab) 350 Mg IV on day 1 every 3 weeks.  First dose 03/06/2024.  Status post 4 cycles of treatment. The patient is currently on maintenance treatment with single agent Libtayo  (Cempilimab) 350 mg IV every 3 weeks status post 3 cycles. He continues to tolerate his treatment fairly well except for mild fatigue. Unfortunately his scan showed increase in the size of the masslike density in the right upper lobe at the site of the previously demonstrated hypermetabolic mass.  He had repeat PET scan that showed hypermetabolic activity in the right suprahilar/paramediastinal consolidation with front configuration favoring active malignancy. Assessment and Plan Assessment & Plan Stage IIIB non-small cell lung cancer, right paratracheal mass Stage IIIB non-small cell lung cancer with a right paratracheal mass. The mass is active and larger than previous scans, indicating disease progression. Previous treatments include concurrent chemoradiation, immunotherapy with durvalumab , and systemic chemotherapy with carboplatin  and gemcitabine . Maintenance treatment with Libtayo  is ongoing. The interventional radiologist plans to perform another pulsed field electroporation (PEF) on the right paratracheal lymph node and mass. A new instrument is anticipated to be available in the next year, which may offer additional treatment options. The goal is to maintain disease control for a year, with the possibility of adding chemotherapy in the future if  needed. The combination of PEF and Libtayo  is expected to work synergistically, potentially stimulating the immune system. - Continue Libtayo  maintenance therapy. - Proceed with pulsed field electroporation (PAF) on the right paratracheal lymph node and mass. - Consult with Dr. Connee Fell at [facility] for further evaluation and potential clinical trial options. - Will consider adding chemotherapy in the future if needed.  Dysphagia Persistent dysphagia, exacerbated by an incident of swallowing vitamins incorrectly. He is being cautious with his diet to manage symptoms. - Advised caution with diet to manage dysphagia symptoms.  The patient voices understanding of current disease status and treatment options and is in agreement with the current care plan.  All questions were answered. The patient knows to call the clinic with any problems, questions or concerns. We can certainly see the patient much sooner if necessary.  The total time spent in the appointment was 20 minutes.  Disclaimer: This note was dictated with voice recognition software. Similar sounding words can inadvertently be transcribed and may not be corrected upon review.

## 2024-09-18 NOTE — Patient Instructions (Signed)
 [facility] - A DEPT OF [facility]  Discharge Instructions: Thank you for choosing [facility] to provide your oncology and hematology care.   If you have a lab appointment with the Cancer Center, please go directly to the Cancer Center and check in at the registration area.   Wear comfortable clothing and clothing appropriate for easy access to any Portacath or PICC line.   We strive to give you quality time with your provider. You may need to reschedule your appointment if you arrive late (15 or more minutes).  Arriving late affects you and other patients whose appointments are after yours.  Also, if you miss three or more appointments without notifying the office, you may be dismissed from the clinic at the provider's discretion.      For prescription refill requests, have your pharmacy contact our office and allow 72 hours for refills to be completed.    Today you received the following chemotherapy and/or immunotherapy agents Libtayo  To help prevent nausea and vomiting after your treatment, we encourage you to take your nausea medication as directed.  BELOW ARE SYMPTOMS THAT SHOULD BE REPORTED IMMEDIATELY: *FEVER GREATER THAN 100.4 F (38 C) OR HIGHER *CHILLS OR SWEATING *NAUSEA AND VOMITING THAT IS NOT CONTROLLED WITH YOUR NAUSEA MEDICATION *UNUSUAL SHORTNESS OF BREATH *UNUSUAL BRUISING OR BLEEDING *URINARY PROBLEMS (pain or burning when urinating, or frequent urination) *BOWEL PROBLEMS (unusual diarrhea, constipation, pain near the anus) TENDERNESS IN MOUTH AND THROAT WITH OR WITHOUT PRESENCE OF ULCERS (sore throat, sores in mouth, or a toothache) UNUSUAL RASH, SWELLING OR PAIN  UNUSUAL VAGINAL DISCHARGE OR ITCHING   Items with * indicate a potential emergency and should be followed up as soon as possible or go to the Emergency Department if any problems should occur.  Please show the CHEMOTHERAPY ALERT CARD or IMMUNOTHERAPY ALERT  CARD at check-in to the Emergency Department and triage nurse.  Should you have questions after your visit or need to cancel or reschedule your appointment, please contact [facility] - A DEPT [facility]  Dept: [phone]  and follow the prompts.  Office hours are 8:00 a.m. to 4:30 p.m. Monday - Friday. Please note that voicemails left after 4:00 p.m. may not be returned until the following business day.  We are closed weekends and major holidays. You have access to a nurse at all times for urgent questions. Please call the main number to the clinic Dept: [phone] and follow the prompts.   For any non-urgent questions, you may also contact your provider using MyChart. We now offer e-Visits for anyone 59 and older to request care online for non-urgent symptoms. For details visit [email or web address].   Also download the MyChart app! Go to the app store, search MyChart, open the app, select [place], and log in with your MyChart username and password.

## 2024-09-19 ENCOUNTER — Other Ambulatory Visit: Payer: Self-pay

## 2024-09-19 LAB — T4: T4, Total: 7.9 ug/dL (ref 4.5–12.0)

## 2024-09-22 ENCOUNTER — Telehealth: Payer: Self-pay | Admitting: *Deleted

## 2024-09-22 DIAGNOSIS — C3491 Malignant neoplasm of unspecified part of right bronchus or lung: Secondary | ICD-10-CM | POA: Diagnosis not present

## 2024-09-22 DIAGNOSIS — Z79899 Other long term (current) drug therapy: Secondary | ICD-10-CM | POA: Diagnosis not present

## 2024-09-22 NOTE — Telephone Encounter (Signed)
 Spoke with [name] at [facility]. Requesting Genetic Testing for his referral appt. today. Faxed Saliva Test done 05/07/24 with results on 05/16/24.

## 2024-09-24 ENCOUNTER — Telehealth: Payer: Self-pay | Admitting: *Deleted

## 2024-09-24 NOTE — Telephone Encounter (Signed)
 Mr. Leider left Voice Mail with following information for Dr. Sherrod: He had appt with Dr. Wonda at [facility] on Monday. She will be contacting Dr. Sherrod. She wants to do genetic testing - basically a biopsy to determine what is triggering this particular cancer growth. He stated he was told that only with genetic testing can they give feedback about treatments that may correlate with whatever the triggers are. He would like Dr. Sherrod to contact him after he speaks with her, or if she does not contact him.

## 2024-09-25 ENCOUNTER — Telehealth: Payer: Self-pay | Admitting: *Deleted

## 2024-09-25 NOTE — Telephone Encounter (Signed)
 Spoke with pt. and informed him of Dr. Jeannett response in regards to Dr. Wonda from [facility] contacting him.  She already called me after his visit. I explained to her that all these molecular studies have been done and shared it with her. She does not have any other recommendation. She is in agreement with the current care plan. Thank you  I asked pt. if it is ok to change his appt's to 10/13/24 so he can have a visit with Dr. Sherrod prior to his treatment. Message sent to charge nurse about availability in infusion on 10/13/24. Awaiting response.

## 2024-09-26 ENCOUNTER — Telehealth: Payer: Self-pay

## 2024-09-26 NOTE — Telephone Encounter (Signed)
 Spoke with patient in regards to appt changes.  Informed patient of his new appts for port flush with lab at 1:15 PM, visit with Dr. Sherrod at 1:45 PM, and infusion at 2:30 PM. He voiced understanding.

## 2024-10-09 ENCOUNTER — Other Ambulatory Visit: Payer: Self-pay

## 2024-10-10 ENCOUNTER — Inpatient Hospital Stay

## 2024-10-10 ENCOUNTER — Inpatient Hospital Stay: Admitting: Nurse Practitioner

## 2024-10-13 ENCOUNTER — Inpatient Hospital Stay: Admitting: Internal Medicine

## 2024-10-13 ENCOUNTER — Inpatient Hospital Stay: Attending: Internal Medicine

## 2024-10-13 ENCOUNTER — Inpatient Hospital Stay

## 2024-10-13 VITALS — BP 122/76 | HR 75 | Temp 97.7°F | Resp 17 | Ht 65.0 in | Wt 142.0 lb

## 2024-10-13 DIAGNOSIS — C3491 Malignant neoplasm of unspecified part of right bronchus or lung: Secondary | ICD-10-CM

## 2024-10-13 DIAGNOSIS — Z5111 Encounter for antineoplastic chemotherapy: Secondary | ICD-10-CM | POA: Insufficient documentation

## 2024-10-13 DIAGNOSIS — R053 Chronic cough: Secondary | ICD-10-CM | POA: Diagnosis not present

## 2024-10-13 DIAGNOSIS — Z79620 Long term (current) use of immunosuppressive biologic: Secondary | ICD-10-CM | POA: Diagnosis not present

## 2024-10-13 DIAGNOSIS — C679 Malignant neoplasm of bladder, unspecified: Secondary | ICD-10-CM | POA: Diagnosis present

## 2024-10-13 DIAGNOSIS — Z5112 Encounter for antineoplastic immunotherapy: Secondary | ICD-10-CM | POA: Insufficient documentation

## 2024-10-13 DIAGNOSIS — C3411 Malignant neoplasm of upper lobe, right bronchus or lung: Secondary | ICD-10-CM | POA: Insufficient documentation

## 2024-10-13 DIAGNOSIS — E041 Nontoxic single thyroid nodule: Secondary | ICD-10-CM | POA: Insufficient documentation

## 2024-10-13 LAB — CBC WITH DIFFERENTIAL (CANCER CENTER ONLY)
Abs Immature Granulocytes: 0.06 K/uL (ref 0.00–0.07)
Basophils Absolute: 0 K/uL (ref 0.0–0.1)
Basophils Relative: 0 %
Eosinophils Absolute: 0.3 K/uL (ref 0.0–0.5)
Eosinophils Relative: 5 %
HCT: 36.2 % — ABNORMAL LOW (ref 39.0–52.0)
Hemoglobin: 11.6 g/dL — ABNORMAL LOW (ref 13.0–17.0)
Immature Granulocytes: 1 %
Lymphocytes Relative: 11 %
Lymphs Abs: 0.8 K/uL (ref 0.7–4.0)
MCH: 26.1 pg (ref 26.0–34.0)
MCHC: 32 g/dL (ref 30.0–36.0)
MCV: 81.3 fL (ref 80.0–100.0)
Monocytes Absolute: 0.7 K/uL (ref 0.1–1.0)
Monocytes Relative: 10 %
Neutro Abs: 5.1 K/uL (ref 1.7–7.7)
Neutrophils Relative %: 73 %
Platelet Count: 269 K/uL (ref 150–400)
RBC: 4.45 MIL/uL (ref 4.22–5.81)
RDW: 14.3 % (ref 11.5–15.5)
WBC Count: 7 K/uL (ref 4.0–10.5)
nRBC: 0 % (ref 0.0–0.2)

## 2024-10-13 LAB — CMP (CANCER CENTER ONLY)
ALT: 20 U/L (ref 0–44)
AST: 27 U/L (ref 15–41)
Albumin: 4 g/dL (ref 3.5–5.0)
Alkaline Phosphatase: 104 U/L (ref 38–126)
Anion gap: 9 (ref 5–15)
BUN: 21 mg/dL (ref 8–23)
CO2: 25 mmol/L (ref 22–32)
Calcium: 9.3 mg/dL (ref 8.9–10.3)
Chloride: 106 mmol/L (ref 98–111)
Creatinine: 0.91 mg/dL (ref 0.61–1.24)
GFR, Estimated: >60 mL/min (ref >60)
Glucose, Bld: 88 mg/dL (ref 70–99)
Potassium: 4 mmol/L (ref 3.5–5.1)
Sodium: 140 mmol/L (ref 135–145)
Total Bilirubin: 0.5 mg/dL (ref 0.0–1.2)
Total Protein: 7.5 g/dL (ref 6.5–8.1)

## 2024-10-13 LAB — TSH: TSH: 2.96 u[IU]/mL (ref 0.350–4.500)

## 2024-10-13 MED ORDER — SODIUM CHLORIDE 0.9 % IV SOLN
350.0000 mg | Freq: Once | INTRAVENOUS | Status: AC
  Administered 2024-10-13: 350 mg via INTRAVENOUS
  Filled 2024-10-13: qty 7

## 2024-10-13 MED ORDER — SODIUM CHLORIDE 0.9 % IV SOLN: INTRAVENOUS | Status: DC

## 2024-10-13 NOTE — Patient Instructions (Signed)
 [facility] - A DEPT OF [facility]  Discharge Instructions: Thank you for choosing [facility] to provide your oncology and hematology care.   If you have a lab appointment with the Cancer Center, please go directly to the Cancer Center and check in at the registration area.   Wear comfortable clothing and clothing appropriate for easy access to any Portacath or PICC line.   We strive to give you quality time with your provider. You may need to reschedule your appointment if you arrive late (15 or more minutes).  Arriving late affects you and other patients whose appointments are after yours.  Also, if you miss three or more appointments without notifying the office, you may be dismissed from the clinic at the provider's discretion.      For prescription refill requests, have your pharmacy contact our office and allow 72 hours for refills to be completed.    Today you received the following chemotherapy and/or immunotherapy agents: cemiplimab -rwlc      To help prevent nausea and vomiting after your treatment, we encourage you to take your nausea medication as directed.  BELOW ARE SYMPTOMS THAT SHOULD BE REPORTED IMMEDIATELY: *FEVER GREATER THAN 100.4 F (38 C) OR HIGHER *CHILLS OR SWEATING *NAUSEA AND VOMITING THAT IS NOT CONTROLLED WITH YOUR NAUSEA MEDICATION *UNUSUAL SHORTNESS OF BREATH *UNUSUAL BRUISING OR BLEEDING *URINARY PROBLEMS (pain or burning when urinating, or frequent urination) *BOWEL PROBLEMS (unusual diarrhea, constipation, pain near the anus) TENDERNESS IN MOUTH AND THROAT WITH OR WITHOUT PRESENCE OF ULCERS (sore throat, sores in mouth, or a toothache) UNUSUAL RASH, SWELLING OR PAIN  UNUSUAL VAGINAL DISCHARGE OR ITCHING   Items with * indicate a potential emergency and should be followed up as soon as possible or go to the Emergency Department if any problems should occur.  Please show the CHEMOTHERAPY ALERT CARD or  IMMUNOTHERAPY ALERT CARD at check-in to the Emergency Department and triage nurse.  Should you have questions after your visit or need to cancel or reschedule your appointment, please contact [facility] - A DEPT OF [facility]  Dept: [phone]  and follow the prompts.  Office hours are 8:00 a.m. to 4:30 p.m. Monday - Friday. Please note that voicemails left after 4:00 p.m. may not be returned until the following business day.  We are closed weekends and major holidays. You have access to a nurse at all times for urgent questions. Please call the main number to the clinic Dept: [phone] and follow the prompts.   For any non-urgent questions, you may also contact your provider using MyChart. We now offer e-Visits for anyone 22 and older to request care online for non-urgent symptoms. For details visit [email or web address].   Also download the MyChart app! Go to the app store, search "MyChart", open the app, select , and log in with your MyChart username and password.

## 2024-10-13 NOTE — Progress Notes (Signed)
 [facility] [phone]   [phone]  OFFICE PROGRESS NOTE  Mendez, Alan ORN, MD [address] [place] [address]  DIAGNOSIS:  1) Stage IIIb (T3, N2, M0) non-small cell lung cancer, squamous cell carcinoma presented with 2 separate right upper lobe lung nodule in addition to mediastinal lymphadenopathy diagnosed in January 2024. 2) left thyroid  nodule.  PDL1 expression: 1%   PRIOR THERAPY:  1) A course of concurrent chemoradiation with weekly carboplatin  for AUC of 2 and paclitaxel  45 Mg/M2. Last dose on 02/05/23. Status post 6 cycles. Taxol  changed to abraxane  starting from cycle #4 due to reaction to taxol   2) Consolidation immunotherapy with Imfinzi  1500 mg IV every 4 weeks.  First dose expected on 03/12/23.  Status post 7 cycles. 3) CT-guided pulsed field electroporation of the right suprahilar mass under the care of Dr. Sharron at [facility] on 02/12/2024. 4) Systemic chemotherapy with Carboplatin  for AUC of 5 on day 1, gemcitabine  1000 Mg/M2 on days 1 and 8 as well as Libtayo  (Cempilimab) 350 Mg IV on day 1 every 3 weeks.  First dose March 06, 2024.  Status post 4 cycles.   CURRENT THERAPY: Maintenance treatment with immunotherapy with Libtayo  (Cempilimab) 350 mg IV every 3 weeks status post 6 cycles.  INTERVAL HISTORY: Alan Mendez 83 y.o. male returns to the clinic today for follow-up visit accompanied by his wife.  Discussed the use of AI scribe software for clinical note transcription with the patient, who gave verbal consent to proceed.  History of Present Illness Alan Mendez is an 83 year old male with stage 3B non-small cell lung cancer who presents for evaluation before starting cycle number seventeen of maintenance treatment.  He has a history of stage 3B non-small cell lung cancer, diagnosed in January 2024, with PD-L1 expression of 1%. Initially, he underwent concurrent chemoradiation followed by seven cycles  of consolidation treatment with durvalumab , which was discontinued due to disease progression. Subsequently, he started treatment with CT-guided pulse field electroporation and Libtayo  for four cycles and is currently on maintenance treatment with single-agent Libtayo  every three weeks, having completed six cycles.  He experiences a persistent cough, which he describes as 'forever,' particularly worsening in the winter when his sinuses drain. He recently visited a doctor for a suspected cold and was prescribed an inhaler, which he believes contains a steroid.   He is scheduled for a procedure on Thursday with an interventional audiologist at [facility] for pulse field electroporation. He also mentions an upcoming MRI to investigate a possible lump in his prostate, as his prostate-specific antigen (PSA) levels have been consistently high. He recalls a previous doctor visit where a lump was noted, prompting the MRI for further evaluation.  In terms of medication, he is currently on maintenance treatment with Libtayo  every three weeks. He also uses an inhaler, likely containing a steroid, for respiratory symptoms.   MEDICAL HISTORY: Past Medical History:  Diagnosis Date   Acquired solitary kidney    left --  s/p  right nephroureterctomy 01/ 2018   Anxiety    no current problems   Depression    no current problems   Elevated PSA    Enlarged lymph node 11/2022   enlarged right paratracheal lymph node.   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, negative margins)   History of kidney stones    passed stones and also surgery to remove   History of unilateral nephrectomy    01/ 2018  right nephroureterectomy for renal pelvis mass (carcinoma in situ)   HLD (hyperlipidemia)    on crestor     Hyperplasia of prostate without lower urinary tract symptoms (LUTS)    Lung cancer (HCC) 12/07/2022   Nephrolithiasis    bilateral non-obstructive   Pneumonia    x 1 - yrs ago   Pulmonary nodules 11/2022   2 right upper lobe pulmonary nodules   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HCC)    Renal cyst, acquired, right    right kidney removed   Wears glasses     ALLERGIES:  is allergic to lamisil [terbinafine] and ivp dye [iodinated contrast media].  MEDICATIONS:  Current Outpatient Medications  Medication Sig Dispense Refill   cholecalciferol  (VITAMIN D3) 25 MCG (1000 UNIT) tablet Take 1,000 Units by mouth daily.     diphenhydrAMINE  (BENADRYL ) 50 MG tablet Take 1 tablet 2 hours before the CT scan. 1 tablet 0   lidocaine -prilocaine  (EMLA ) cream Apply small amount cream to port a cath site 30-60 minutes prior to accessing port 30 g 0   Multiple Vitamins-Minerals (MULTIVITAMIN WITH MINERALS) tablet Take 1 tablet by mouth daily. Centrum     predniSONE  (DELTASONE ) 50 MG tablet 1 tablet 13 hours before then 7 hours before then 2 hours before the CT scan. Please take Benadryl  over-the-counter 50 mg 1 time 2 hours before the scan with the last dose of prednisone . 3 tablet 2   prochlorperazine  (COMPAZINE ) 10 MG tablet Take 10 mg by mouth every 6 (six) hours as needed for nausea or vomiting.     rosuvastatin  (CRESTOR ) 10 MG tablet TAKE 1 TABLET EACH DAY. (Patient taking differently: Take 20 mg by mouth daily.) 30 tablet 0   silver  sulfADIAZINE  (SILVADENE ) 1 % cream Apply 1 Application topically daily. 50 g 0   sucralfate  (CARAFATE ) 1 g tablet Take 1 tablet (1 g total) by mouth 4 (four) times daily -  with meals and at bedtime. Dissolve tablet in water  -swish and swallow. 30 tablet 2   tamsulosin  (FLOMAX ) 0.4 MG CAPS capsule Take 1 capsule (0.4 mg total) by mouth daily. (Patient taking differently: Take 0.4 mg by mouth daily after breakfast.) 30 capsule 5   No current facility-administered  medications for this visit.    SURGICAL HISTORY:  Past Surgical History:  Procedure Laterality Date   APPENDECTOMY  child   BRONCHIAL BIOPSY  12/07/2022   Procedure: BRONCHIAL BIOPSIES;  Surgeon: Brenna Adine CROME, DO;  Location: [facility];  Service: Pulmonary;;   BRONCHIAL BRUSHINGS  12/07/2022   Procedure: BRONCHIAL BRUSHINGS;  Surgeon: Brenna Adine CROME, DO;  Location: [facility];  Service: Pulmonary;;   BRONCHIAL NEEDLE ASPIRATION BIOPSY  12/07/2022   Procedure: BRONCHIAL NEEDLE ASPIRATION BIOPSIES;  Surgeon: Brenna Adine CROME, DO;  Location: [facility];  Service: Pulmonary;;   CATARACT EXTRACTION W/ INTRAOCULAR LENS  IMPLANT, BILATERAL  2017   COLONOSCOPY  08/15/2012   Tubular Adenoma, No high grade dysplasia or malignacy.   CYSTOSCOPY W/ RETROGRADES Right 09/25/2016   Procedure: CYSTOSCOPY, URETHRAL DILITATION  WITH RETROGRADE PYELOGRAM, BRUSH BIOPSIES OF RIGHT RENAL MASS;  Surgeon: Arlena Gal, MD;  Location: [facility];  Service: Urology;  Laterality: Right;   CYSTOSCOPY W/ RETROGRADES Left 03/16/2017   Procedure: CYSTOSCOPY WITH  RETROGRADE PYELOGRAM;  Surgeon: Cam Morene ORN, MD;  Location: [facility];  Service: Urology;  Laterality: Left;   CYSTOSCOPY W/ URETERAL STENT REMOVAL Right 09/25/2016   Procedure: CYSTOSCOPY WITH STENT REMOVAL;  Surgeon: Arlena Gal, MD;  Location: [facility];  Service: Urology;  Laterality: Right;   CYSTOSCOPY WITH RETROGRADE PYELOGRAM, URETEROSCOPY AND STENT PLACEMENT Right 08/21/2016   Procedure: CYSTOSCOPY WITH RIGHT RETROGRADE PYELOGRAM, RIGHT FLEXIBLE AND RIGID URETEROSCOPY, INSERTION DOUBLE J STENT RIGHT;  Surgeon: Arlena Gal, MD;  Location: [facility] ;  Service: Urology;  Laterality: Right;   EXTRACORPOREAL SHOCK WAVE LITHOTRIPSY  2009   IR IMAGING GUIDED PORT INSERTION  07/09/2023   KNEE SURGERY Right 1980's   ROBOT ASSITED LAPAROSCOPIC NEPHROURETERECTOMY  Right 11/15/2016   Procedure: RIGHT XI ROBOT ASSITED LAPAROSCOPIC NEPHROURETERECTOMY;  Surgeon: Morene ORN Cam, MD;  Location: [facility];  Service: Urology;  Laterality: Right;   TRANSURETHRAL RESECTION OF BLADDER TUMOR WITH MITOMYCIN -C Left 03/16/2017   Procedure: CYSTOSCOPY BIOPSIES OF BLADDER TUMOR WITH FULGURATION;  Surgeon: Cam Morene ORN, MD;  Location: [facility];  Service: Urology;  Laterality: Left;   URETEROSCOPY Right 09/25/2016   Procedure: RIGHT URETEROSCOPY;  Surgeon: Arlena Gal, MD;  Location: [facility];  Service: Urology;  Laterality: Right;   VIDEO BRONCHOSCOPY WITH ENDOBRONCHIAL ULTRASOUND Right 12/07/2022   Procedure: VIDEO BRONCHOSCOPY WITH ENDOBRONCHIAL ULTRASOUND;  Surgeon: Brenna Adine CROME, DO;  Location: [facility];  Service: Pulmonary;  Laterality: Right;    REVIEW OF SYSTEMS:  A comprehensive review of systems was negative except for: Ears, nose, mouth, throat, and face: positive for sore throat   PHYSICAL EXAMINATION: General appearance: alert, cooperative, and no distress Head: Normocephalic, without obvious abnormality, atraumatic Neck: no adenopathy, no JVD, supple, symmetrical, trachea midline, and thyroid  not enlarged, symmetric, no tenderness/mass/nodules Lymph nodes: Cervical, supraclavicular, and axillary nodes normal. Resp: clear to auscultation bilaterally Back: symmetric, no curvature. ROM normal. No CVA tenderness. Cardio: regular rate and rhythm, S1, S2 normal, no murmur, click, rub or gallop GI: soft, non-tender; bowel sounds normal; no masses,  no organomegaly Extremities: extremities normal, atraumatic, no cyanosis or edema  ECOG PERFORMANCE STATUS: 1 - Symptomatic but completely ambulatory  Blood pressure 122/76, pulse 75, temperature 97.7 F (36.5 C), temperature source Temporal, resp. rate 17, height 5' 5 (1.651 m), weight 142 lb (64.4 kg), SpO2 100%.  LABORATORY DATA: Lab Results  Component Value  Date   WBC 6.3 09/18/2024   HGB 12.1 (L) 09/18/2024   HCT 36.6 (L) 09/18/2024   MCV 81.7 09/18/2024   PLT 255 09/18/2024      Chemistry      Component Value Date/Time   NA 139 09/18/2024 1035   K 4.0 09/18/2024 1035   CL 107 09/18/2024 1035   CO2 26 09/18/2024 1035   BUN 22 09/18/2024 1035   CREATININE 1.10 09/18/2024 1035   CREATININE 0.85 12/10/2014 1550      Component Value Date/Time   CALCIUM  8.9 09/18/2024 1035   ALKPHOS 91 09/18/2024 1035   AST 21 09/18/2024 1035   ALT 14 09/18/2024 1035   BILITOT 0.6 09/18/2024 1035       RADIOGRAPHIC STUDIES: No results found.    ASSESSMENT AND PLAN: This is a very pleasant 83 years old white male with Stage IIIb (T3, N2, M0) non-small cell lung cancer, squamous cell carcinoma presented with 2 separate right upper lobe lung nodule in addition to mediastinal lymphadenopathy diagnosed in January 2024. The patient  has no evidence of metastatic disease to the brain or extrathoracic metastasis besides the 2 right upper lobe pulmonary nodule and mediastinal lymphadenopathy. I discussed his case with Dr. Kerrin, cardiothoracic surgery and he indicated that because of the location of the lymph node, the patient will not be a great candidate for surgical resection with negative margin. The patient underwent a course of concurrent chemoradiation with weekly carboplatin  for AUC of 2 and paclitaxel  45 Mg/M2.  Status post 6 cycles.  His treatment with paclitaxel  was changed to Abraxane  starting from cycle #3 with secondary to hypersensitivity reaction. He tolerated this treatment fairly well with partial response. He underwent consolidation treatment with immunotherapy with Imfinzi  1500 Mg IV every 4 weeks status post 7 cycles.  This treatment was discontinued secondary to disease progression. The patient was seen by thoracic surgery at [facility] for consideration of surgical resection but he was not a surgical candidate. The patient  underwent pulsed electric field therapy to the right suprahilar mass at [facility]. He underwent systemic chemotherapy with carboplatin  for AUC of 5 on day 1, gemcitabine  1000 Mg/M2 on days 1 and 8 in addition to Libtayo  (Cempilimab) 350 Mg IV on day 1 every 3 weeks.  First dose 03/06/2024.  Status post 4 cycles of treatment. The patient is currently on maintenance treatment with single agent Libtayo  (Cempilimab) 350 mg IV every 3 weeks status post 3 cycles. He continues to tolerate his treatment fairly well except for mild fatigue. Unfortunately his scan showed increase in the size of the masslike density in the right upper lobe at the site of the previously demonstrated hypermetabolic mass.  He had repeat PET scan that showed hypermetabolic activity in the right suprahilar/paramediastinal consolidation with front configuration favoring active malignancy. He is scheduled for another procedure with Pulsed field electroporation by interventional radiology at [facility] later this week. Assessment and Plan Assessment & Plan Stage 3B non-small cell lung cancer, squamous cell carcinoma Diagnosed in January 2024 with PD-L1 expression of 1%. Underwent concurrent chemoradiation followed by seven cycles of durvalumab , discontinued due to disease progression. Currently on maintenance treatment with Libtayo  (Cempilimab) every three weeks, status post six cycles. Scheduled for CT-guided pulse field electroporation. Molecular studies show no actionable mutations for squamous cell carcinoma. No current clinical trials available. Future options include chemotherapy if disease progresses. - Continue Libtayo  (Cempilimab) every three weeks. - Proceed with CT-guided pulse field electroporation. - Monitor response to treatment and adjust plan as needed. - Will consider chemotherapy if disease progresses.  He was advised to call immediately if he has any other concerning symptoms in the interval. The patient  voices understanding of current disease status and treatment options and is in agreement with the current care plan.  All questions were answered. The patient knows to call the clinic with any problems, questions or concerns. We can certainly see the patient much sooner if necessary.  The total time spent in the appointment was 20 minutes.  Disclaimer: This note was dictated with voice recognition software. Similar sounding words can inadvertently be transcribed and may not be corrected upon review.

## 2024-10-14 ENCOUNTER — Telehealth: Payer: Self-pay

## 2024-10-14 LAB — T4: T4, Total: 7.6 ug/dL (ref 4.5–12.0)

## 2024-10-14 NOTE — Telephone Encounter (Signed)
 Received VM from Amy @ [facility] regarding PT/INR results for pts procedure scheduled for 10/16/24.  Pt did not have PT/INR drawn yesterday so called pt to schedule an appt for 10/15/24 @ 0845 for lab draw.  Pt verbalized understanding.  Left message with Amy regarding pts PT/INR not drawn.

## 2024-10-15 ENCOUNTER — Inpatient Hospital Stay
Admission: RE | Admit: 2024-10-15 | Discharge: 2024-10-15 | Disposition: A | Source: Ambulatory Visit | Attending: Urology

## 2024-10-15 ENCOUNTER — Inpatient Hospital Stay: Attending: Internal Medicine

## 2024-10-15 DIAGNOSIS — R972 Elevated prostate specific antigen [PSA]: Secondary | ICD-10-CM

## 2024-10-15 DIAGNOSIS — D539 Nutritional anemia, unspecified: Secondary | ICD-10-CM

## 2024-10-15 DIAGNOSIS — R5382 Chronic fatigue, unspecified: Secondary | ICD-10-CM

## 2024-10-15 DIAGNOSIS — N400 Enlarged prostate without lower urinary tract symptoms: Secondary | ICD-10-CM | POA: Diagnosis not present

## 2024-10-15 DIAGNOSIS — Z5112 Encounter for antineoplastic immunotherapy: Secondary | ICD-10-CM | POA: Diagnosis not present

## 2024-10-15 LAB — PROTIME-INR
INR: 1 (ref 0.8–1.2)
Prothrombin Time: 13.3 s (ref 11.4–15.2)

## 2024-10-15 MED ORDER — GADOPICLENOL 0.5 MMOL/ML IV SOLN
6.0000 mL | Freq: Once | INTRAVENOUS | Status: AC | PRN
  Administered 2024-10-15: 6 mL via INTRAVENOUS

## 2024-10-16 DIAGNOSIS — C3491 Malignant neoplasm of unspecified part of right bronchus or lung: Secondary | ICD-10-CM | POA: Diagnosis not present

## 2024-10-16 DIAGNOSIS — R918 Other nonspecific abnormal finding of lung field: Secondary | ICD-10-CM | POA: Diagnosis not present

## 2024-10-16 DIAGNOSIS — M419 Scoliosis, unspecified: Secondary | ICD-10-CM | POA: Diagnosis not present

## 2024-10-17 ENCOUNTER — Encounter: Payer: Self-pay | Admitting: Internal Medicine

## 2024-10-21 ENCOUNTER — Encounter: Payer: Self-pay | Admitting: Internal Medicine

## 2024-10-30 ENCOUNTER — Inpatient Hospital Stay

## 2024-10-30 ENCOUNTER — Inpatient Hospital Stay: Admitting: Physician Assistant

## 2024-11-03 NOTE — Progress Notes (Unsigned)
 [facility] OFFICE PROGRESS NOTE  [name], Charlie ORN, MD [address] [place] [address]  DIAGNOSIS:  1)He was initially diagnosed with Stage IIIb (T3, N2, M0) non-small cell lung cancer, squamous cell carcinoma presented with 2 separate right upper lobe lung nodule in addition to mediastinal lymphadenopathy diagnosed in January 2024. In retrospect when this was compared to his tissue from 2018 and feel that this is consistent with metastatic bladder cancer.  2) left thyroid  nodule.   PDL1 expression: 1%   Molecular: FGFR Detected Alteration(s) / Biomarker(s) Associated FDA-approved therapies Clinical Trial Availability  FGFR3 Y373C  approved in other indication Erdafitinib Yes PTEN Q236fs  approved in other indication Capivasertib Yes  PRIOR THERAPY: 1) right nephroureterectomy on November 15, 2016 by Dr. Cam and the final pathology showed in situ (noninvasive) low-grade papillary urothelial carcinoma measuring 4.0 cm involving the renal pelvis with benign simple cyst and all the margins of resection were negative for carcinoma. The patient was also found to have similar findings in the bladder with biopsy showing focal invasive high-grade papillary urothelial carcinoma and the carcinoma invades the lamina propria at a level above the muscularis mucosa but the muscularis propria was present and not involved. He was treated several times with intravesical BCG until November 2018.  2) A course of concurrent chemoradiation with weekly carboplatin  for AUC of 2 and paclitaxel  45 Mg/M2. Last dose on 02/05/23. Status post 6 cycles. Taxol  changed to abraxane  starting from cycle #4 due to reaction to taxol   3) Consolidation immunotherapy with Imfinzi  1500 mg IV every 4 weeks.  First dose expected on 03/12/23.  Status post 7 cycles. 4) CT-guided pulsed field electroporation of the right suprahilar mass under the care of Dr. Sharron at [facility] on 02/12/2024. 5)  Systemic chemotherapy with Carboplatin  for AUC of 5 on day 1, gemcitabine  1000 Mg/M2 on days 1 and 8 as well as Libtayo  (Cempilimab) 350 Mg IV on day 1 every 3 weeks.  First dose March 06, 2024.  Status post 4 cycles 6) procedure at [facility] with interventional radiology for pulse field electroporation. 7) Maintenance treatment with immunotherapy with Libtayo  (Cempilimab) 350 mg IV every 3 weeks status post 7 cycles.   CURRENT THERAPY: Systemic chemotherapy with carboplatin  for an AUC of 5 and gemcitabine  on days 1 and 3 IV every 3 weeks. First dose expected on   INTERVAL HISTORY: Alan Mendez 83 y.o. male returns to the clinic today for a follow-up visit accompanied by his wife.  The patient was last seen in clinic by Dr. Sherrod on 10/13/2024.   The patient was being treated for stage IIIb non-small cell lung cancer.  He is currently on immunotherapy with Libtayo  IV every 3 weeks.  He status post 7 cycles.  Of note, he has a history of urothelial carcinoma. In 2018, he had low-grade papillary urothelial carcinoma measuring 4.0 cm involving the renal pelvis with benign simple cyst and all the margins of resection were negative for carcinoma. The patient was also found to have similar findings in the bladder with biopsy showing focal invasive high-grade papillary urothelial carcinoma and the carcinoma invades the lamina propria at a level above the muscularis mucosa but the muscularis propria was present and not involved. He was treated several times with intravesical BCG until November 2018.   His tissue was sent to [facility]. They noted discrepancy with his tissue. They felt that this is most likely metastatic urothelial carcinoma rather than primary lung squamous cell  carcinoma recommended comparing with the original urothelial cancer. Pathology believes that this is actually urothelial cancer as opposed to lung cancer. The patient has a remote smoking history.   The interval since being seen he  saw an interventional radiologist at [facility] for pulsed field electroporation to the lung.   He saw his urologist recently. He reports stable PSA but his prior PET scan showed area on his prostate so he is scheduled for imaging in January 2026. If this is concerning, then he states this was told it is treatable.  PSA levels have remained stable, and he has no urinary symptoms or other prostate-related complaints.  The patient follows with Dr. Cam.  The patient states that he has had routine cystoscopies and follow-up since 2018 and all of his cystoscopies have been clear.  Overall, he is feeling well today besides mild fatigue.  He denies any fever, chills, night sweats, or unexplained weight loss.  He does have a persistent cough which has been going on for several years.  He thinks it may be getting a little bit worse.  It is primarily in the morning and at night.  He notes that if he takes DayQuil that it helps.  He feels like this is coming from his throat as opposed to his long.  He denies any nausea, vomiting, diarrhea, or constipation.  Denies any rashes or skin changes.  The patient is still active and goes to the gym and rides the bike several days a week.  He is here today for evaluation and for more detailed discussion about his current condition and treatment options.     MEDICAL HISTORY: Past Medical History:  Diagnosis Date   Acquired solitary kidney    left --  s/p  right nephroureterctomy 01/ 2018   Anxiety    no current problems   Depression    no current problems   Elevated PSA    Enlarged lymph node 11/2022   enlarged right paratracheal lymph node.   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, negative margins)   History of kidney stones    passed  stones and also surgery to remove   History of unilateral nephrectomy    01/ 2018  right nephroureterectomy for renal pelvis mass (carcinoma in situ)   HLD (hyperlipidemia)    on crestor    Hyperplasia of prostate without lower urinary tract symptoms (LUTS)    Lung cancer (HCC) 12/07/2022   Nephrolithiasis    bilateral non-obstructive   Pneumonia    x 1 - yrs ago   Pulmonary nodules 11/2022   2 right upper lobe pulmonary nodules   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HCC)    Renal cyst, acquired, right    right kidney removed   Wears glasses     ALLERGIES:  is allergic to lamisil [terbinafine] and ivp dye [iodinated contrast media].  MEDICATIONS:  Current Outpatient Medications  Medication Sig Dispense Refill   cholecalciferol  (VITAMIN D3) 25 MCG (1000 UNIT) tablet Take 1,000 Units by mouth daily.     diphenhydrAMINE  (BENADRYL ) 50 MG tablet Take 1 tablet 2 hours before the CT scan. 1 tablet 0   lidocaine -prilocaine  (EMLA ) cream Apply small amount cream to port a cath site 30-60 minutes prior to accessing port 30 g  0   Multiple Vitamins-Minerals (MULTIVITAMIN WITH MINERALS) tablet Take 1 tablet by mouth daily. Centrum     predniSONE  (DELTASONE ) 50 MG tablet 1 tablet 13 hours before then 7 hours before then 2 hours before the CT scan. Please take Benadryl  over-the-counter 50 mg 1 time 2 hours before the scan with the last dose of prednisone . 3 tablet 2   prochlorperazine  (COMPAZINE ) 10 MG tablet Take 10 mg by mouth every 6 (six) hours as needed for nausea or vomiting.     rosuvastatin  (CRESTOR ) 10 MG tablet TAKE 1 TABLET EACH DAY. (Patient taking differently: Take 20 mg by mouth daily.) 30 tablet 0   silver  sulfADIAZINE  (SILVADENE ) 1 % cream Apply 1 Application topically daily. 50 g 0   sucralfate  (CARAFATE ) 1 g tablet Take 1 tablet (1 g total) by mouth 4 (four) times daily -  with meals and at bedtime. Dissolve tablet in water  -swish and swallow. 30 tablet 2   tamsulosin  (FLOMAX )  0.4 MG CAPS capsule Take 1 capsule (0.4 mg total) by mouth daily. (Patient taking differently: Take 0.4 mg by mouth daily after breakfast.) 30 capsule 5   No current facility-administered medications for this visit.    SURGICAL HISTORY:  Past Surgical History:  Procedure Laterality Date   APPENDECTOMY  child   BRONCHIAL BIOPSY  12/07/2022   Procedure: BRONCHIAL BIOPSIES;  Surgeon: Brenna Adine CROME, DO;  Location: [facility];  Service: Pulmonary;;   BRONCHIAL BRUSHINGS  12/07/2022   Procedure: BRONCHIAL BRUSHINGS;  Surgeon: Brenna Adine CROME, DO;  Location: [facility];  Service: Pulmonary;;   BRONCHIAL NEEDLE ASPIRATION BIOPSY  12/07/2022   Procedure: BRONCHIAL NEEDLE ASPIRATION BIOPSIES;  Surgeon: Brenna Adine CROME, DO;  Location: [facility];  Service: Pulmonary;;   CATARACT EXTRACTION W/ INTRAOCULAR LENS  IMPLANT, BILATERAL  2017   COLONOSCOPY  08/15/2012   Tubular Adenoma, No high grade dysplasia or malignacy.   CYSTOSCOPY W/ RETROGRADES Right 09/25/2016   Procedure: CYSTOSCOPY, URETHRAL DILITATION  WITH RETROGRADE PYELOGRAM, BRUSH BIOPSIES OF RIGHT RENAL MASS;  Surgeon: Arlena Gal, MD;  Location: [facility];  Service: Urology;  Laterality: Right;   CYSTOSCOPY W/ RETROGRADES Left 03/16/2017   Procedure: CYSTOSCOPY WITH RETROGRADE PYELOGRAM;  Surgeon: Cam Morene ORN, MD;  Location: [facility];  Service: Urology;  Laterality: Left;   CYSTOSCOPY W/ URETERAL STENT REMOVAL Right 09/25/2016   Procedure: CYSTOSCOPY WITH STENT REMOVAL;  Surgeon: Arlena Gal, MD;  Location: [facility] [place];  Service: Urology;  Laterality: Right;   CYSTOSCOPY WITH RETROGRADE PYELOGRAM, URETEROSCOPY AND STENT PLACEMENT Right 08/21/2016   Procedure: CYSTOSCOPY WITH RIGHT RETROGRADE PYELOGRAM, RIGHT FLEXIBLE AND RIGID URETEROSCOPY, INSERTION DOUBLE J STENT RIGHT;  Surgeon: Arlena Gal, MD;  Location: [facility];  Service: Urology;   Laterality: Right;   EXTRACORPOREAL SHOCK WAVE LITHOTRIPSY  2009   IR IMAGING GUIDED PORT INSERTION  07/09/2023   KNEE SURGERY Right 1980's   ROBOT ASSITED LAPAROSCOPIC NEPHROURETERECTOMY Right 11/15/2016   Procedure: RIGHT XI ROBOT ASSITED LAPAROSCOPIC NEPHROURETERECTOMY;  Surgeon: Morene ORN Cam, MD;  Location: [facility];  Service: Urology;  Laterality: Right;   TRANSURETHRAL RESECTION OF BLADDER TUMOR WITH MITOMYCIN -C Left 03/16/2017   Procedure: CYSTOSCOPY BIOPSIES OF BLADDER TUMOR WITH FULGURATION;  Surgeon: Cam Morene ORN, MD;  Location: [facility];  Service: Urology;  Laterality: Left;   URETEROSCOPY Right 09/25/2016   Procedure: RIGHT URETEROSCOPY;  Surgeon: Arlena Gal, MD;  Location: [facility];  Service: Urology;  Laterality: Right;  VIDEO BRONCHOSCOPY WITH ENDOBRONCHIAL ULTRASOUND Right 12/07/2022   Procedure: VIDEO BRONCHOSCOPY WITH ENDOBRONCHIAL ULTRASOUND;  Surgeon: Brenna Adine CROME, DO;  Location: [facility];  Service: Pulmonary;  Laterality: Right;    REVIEW OF SYSTEMS:   Review of Systems  Constitutional: Positive for fatigue. Negative for appetite change, chills, fever and unexpected weight change.  HENT:   Negative for mouth sores, nosebleeds, sore throat and trouble swallowing.   Eyes: Negative for eye problems and icterus.  Respiratory: Negative for cough, hemoptysis, shortness of breath and wheezing.   Cardiovascular: Negative for chest pain and leg swelling.  Gastrointestinal: Negative for abdominal pain, constipation, diarrhea, nausea and vomiting.  Genitourinary: Negative for bladder incontinence, difficulty urinating, dysuria, frequency and hematuria.   Musculoskeletal: Negative for back pain, gait problem, neck pain and neck stiffness.  Skin: Negative for itching and rash.  Neurological: Negative for dizziness, extremity weakness, gait problem, headaches, light-headedness and seizures.  Hematological: Negative for  adenopathy. Does not bruise/bleed easily.  Psychiatric/Behavioral: Negative for confusion, depression and sleep disturbance. The patient is not nervous/anxious.     PHYSICAL EXAMINATION:  There were no vitals taken for this visit.  ECOG PERFORMANCE STATUS: 1  Physical Exam  Constitutional: Oriented to person, place, and time and well-developed, well-nourished, and in no distress.  HENT:  Head: Normocephalic and atraumatic.  Mouth/Throat: Oropharynx is clear and moist. No oropharyngeal exudate.  Eyes: Conjunctivae are normal. Right eye exhibits no discharge. Left eye exhibits no discharge. No scleral icterus.  Neck: Normal range of motion. Neck supple.  Cardiovascular: Normal rate, regular rhythm, normal heart sounds and intact distal pulses.   Pulmonary/Chest: Effort normal and breath sounds normal. No respiratory distress. No wheezes. No rales.  Abdominal: Soft. Bowel sounds are normal. Exhibits no distension and no mass. There is no tenderness.  Musculoskeletal: Normal range of motion. Exhibits no edema.  Lymphadenopathy:    No cervical adenopathy.  Neurological: Alert and oriented to person, place, and time. Exhibits normal muscle tone. Gait normal. Coordination normal.  Skin: Skin is warm and dry. No rash noted. Not diaphoretic. No erythema. No pallor.  Psychiatric: Mood, memory and judgment normal.  Vitals reviewed.  LABORATORY DATA: Lab Results  Component Value Date   WBC 7.0 10/13/2024   HGB 11.6 (L) 10/13/2024   HCT 36.2 (L) 10/13/2024   MCV 81.3 10/13/2024   PLT 269 10/13/2024      Chemistry      Component Value Date/Time   NA 140 10/13/2024 1312   K 4.0 10/13/2024 1312   CL 106 10/13/2024 1312   CO2 25 10/13/2024 1312   BUN 21 10/13/2024 1312   CREATININE 0.91 10/13/2024 1312   CREATININE 0.85 12/10/2014 1550      Component Value Date/Time   CALCIUM  9.3 10/13/2024 1312   ALKPHOS 104 10/13/2024 1312   AST 27 10/13/2024 1312   ALT 20 10/13/2024 1312    BILITOT 0.5 10/13/2024 1312       RADIOGRAPHIC STUDIES:  MR PROSTATE W WO CONTRAST Result Date: 10/18/2024 EXAM: MRI PROSTATE WITH AND WITHOUT INTRAVENOUS CONTRAST 10/15/2024 03:07:03 PM TECHNIQUE: Multiparametric MRI imaging of the prostate with and without dynamic contrast enhanced imaging and diffusion weighted imaging was performed. Dynacad/CAD was utilized in analysis of images. COMPARISON: PET CT 08/21/2024. CLINICAL HISTORY: Elevated prostate-specific antigen (PSA), PSA 4.64 in 2016, hypermetabolic foci in the left prostate peripheral zone on recent positron emission tomography-computed tomography (PET CT); non-small cell lung cancer and renal cancer. FINDINGS: PROSTATE: Prostate volume by 3D volumetric  analysis: 66.8 cc (5.4 x 4.8 x 4.8 cm). Region of interest # 1: PI-RADS category 4 lesion of the left posterior peripheral zone at the base with intermediate encapsulation of T2 signal (image 24 series 9) corresponding to focally reduced apparent diffusion coefficient (ADC) map activity and restricted diffusion (image 10 of series 6 and 7) along with early rim enhancement (image 118 series 12). This measures 0.78 cc (1.2 x 1.0 x 0.9 cm). This does correspond to 1 of the hypermetabolic foci on the PET CT. Region of interest # 2: PI-RADS category 4 lesion of the left posteromedial peripheral zone at the apex with intermediate to low T2 signal (image 49 series 9) corresponding to reduced apparent diffusion coefficient (ADC) activity and restricted diffusion (image 18 of series 6 and 7) along with focal early enhancement (image 125 series 12). This measures 0.28 cc (0.9 x 0.7 x 0.6 cm) and corresponds to the second focus of hypermetabolic activity on the PET CT. Encapsulated nodularity in the transition zone compatible with benign prostatic hypertrophy. SEMINAL VESICLES: Unremarkable. NEUROVASCULAR BUNDLE: Unremarkable. LYMPHADENOPATHY: No lymphadenopathy. BLADDER: The bladder is unremarkable. BOWEL:  Sigmoid colon diverticulosis. PERITONEAL CAVITY: No free fluid. BONES: Normal bone marrow signal intensity. No suspicious or aggressive osseous lesions. SOFT TISSUES: Small hydrocele along the right spermatic cord. IMPRESSION: 1. Two PI-RADS category 4 lesions in the left peripheral zone at the base and apex, corresponding to hypermetabolic foci on recent positron emission tomography computed tomography. 2. Prostate volume of 66.8 cubic centimeters. 3. Incidental findings including benign prostatic hypertrophy, small right spermatic cord hydrocele, and sigmoid colon diverticulosis. Electronically signed by: Ryan Salvage MD 10/18/2024 03:45 PM EST RP Workstation: [id number]     ASSESSMENT/PLAN:  This is a very pleasant 83 year old Caucasian male diagnosed with his initially felt to be stage IIIb (T3, N2, M0) non-small cell lung cancer, squamous cell carcinoma. He presented with 2 separate right upper lobe lung nodules in addition to mediastinal lymphadenopathy. He was diagnosed in January 2024. His PDL1 expression is 1%.  However his tissue was compared in December 2025 to his 2018 urothelial carcinoma and was felt to actually be consistent with metastatic urothelial carcinoma as opposed to lung cancer.  He also has a history of right nephroureterectomy on November 15, 2016 by Dr. Cam and the final pathology showed in situ (noninvasive) low-grade papillary urothelial carcinoma measuring 4.0 cm involving the renal pelvis with benign simple cyst and all the margins of resection were negative for carcinoma. The patient was also found to have similar findings in the bladder with biopsy showing focal invasive high-grade papillary urothelial carcinoma and the carcinoma invades the lamina propria at a level above the muscularis mucosa but the muscularis propria was present and not involved. He was treated several times with intravesical BCG until November 2018.    The patient underwent a course of concurrent  chemoradiation with weekly carboplatin  for AUC of 2 and paclitaxel  45 Mg/M2.  Status post 6 cycles. Starting from cycle #4, the taxol  was switched to abraxane  due to an infusion reaction to taxol .  The patient's last chemotherapy was on 02/05/2023. He saw Dr. Dewey from radiation oncology.   He then was on consolidation immunotherapy with Imfinzi  1500 milligrams IV every 4 weeks.  He is status post 7 cycles. His last dose was on 08/27/23.    His CT scan from October 2024 showed parahilar scarring/fibrosis. Small nodules in the medial suprahilar right lung previously have become incorporated into this more confluent soft  tissue density showing a masslike nodular character measuring 4.3 x 1.7 cm. While this masslike component may be related to confluent scarring, recurrent/residual disease is a concern per radiology.   Dr. Sherrod recommended a PET scan to further evaluate. The scan showed intensely hypermetabolic solid 4.2 cm medial right upper lobe lung mass, compatible with persistent viable malignancy, with surrounding evolving post radiation change.    The patient saw Dr. Starlin at [facility]. The patient was not a surgical candidate.    The patient underwent CT-guided pulsed field electroporation of the right suprahilar mass under the care of Dr. Sharron at [facility] on 02/12/2024.  The patient is currently undergoing palliative systemic chemotherapy and immunotherapy with carboplatin  for AUC of 5 on day 1, gemcitabine  1000 mg on days 1 and 8, and Libtayo  350 mg on day 1 IV every 3 weeks.  The patient is status post 4 cycles of treatment. He is currently on maintenance immunotherapy with libtayo .   The patient was seen at [facility].  He underwent repeat pulse field electroporation December 2025.  This tissue was sent to [facility] for second opinion and they felt that the pathology and the additional staining was consistent with metastatic urothelial carcinoma as opposed to primary  lung cancer.  This was reviewed again by our pathology lab who is in agreement.  The patient was seen with Dr. Sherrod today.  Dr. Sherrod had a lengthy discussion with the patient today about his current condition and treatment options.  Dr. Sherrod explained that this is metastatic bladder cancer.  Treatment is palliative in nature. however Dr. Sherrod states that we have more options.  His prior treatments were effective for bladder cancer.   For Sherrod would recommend discontinuing his immunotherapy with Libtayo  and starting him on palliative systemic chemotherapy for 6 cycles with carboplatin  for an AUC of 5 and gemcitabine  on days 1 and 8 IV every 3 weeks.  The patient is interested in this option and he is expected to start next week on 11/12/2024.  I sent in Compazine  to the pharmacy.  After 6 cycles Dr. Sherrod would start him on maintenance immunotherapy with Avelumab.   Expected to have a prostate scan in January 2026.  He will continue to follow with Dr. Cam from urology.  Infected then Dr. Sherrod would recommend treatment with Enfortumab and Keytruda.  Other option includes Erdafitinib   Will see the patient back in 2 weeks before undergoing day 8 cycle 1.   The patient was advised to call immediately if he has any concerning symptoms in the interval. The patient voices understanding of current disease status and treatment options and is in agreement with the current care plan. All questions were answered. The patient knows to call the clinic with any problems, questions or concerns. We can certainly see the patient much sooner if necessary     No orders of the defined types were placed in this encounter.    Tenasia Aull L Mathayus Stanbery, PA-C 11/03/2024  ADDENDUM:  Hematology/Oncology Attending: I had a face-to-face encounter with the patient today.  I reviewed his record, lab, recent biopsy results and recommended his care plan.  The patient is a very pleasant  83 years old white male with history of urothelial carcinoma in 2018 status post surgical resection including a right radical nephrectomy and he was followed by urology.  The patient was diagnosed in January 2024 with stage IIIb non-small cell lung cancer, squamous cell carcinoma and treated with a course of  concurrent chemoradiation followed by consolidation immunotherapy for 7 cycles before it was discontinued secondary to disease progression.  He underwent procedure with pulsed field electroporation twice in April 2025 and recently in December 2025.  He also was treated with systemic chemotherapy with carboplatin , gemcitabine  and Libtayo  (Cempilimab) with partial response initially but disease progression on the maintenance treatment with single agent Libtayo  (Cempilimab).  He was seen recently for a second opinion at [facility] and review of the pathology report from the lung cancer diagnosed in January 2025 showed that it is actually more consistent with his previous history of urothelial carcinoma that was initially diagnosed in 2018.  Review of the slides will be done again with the [facility] pathology for confirmation.  He had molecular study performed on the biopsy from January 2025 and the patient has positive FGFR which can make him a good candidate for treatment with erdafitinib. I had a lengthy discussion with the patient and his wife today about the new changes in his diagnosis and possible treatment options.  Actually has a diagnosis of metastatic urothelial carcinoma open with the daughter for more options for treatment of his condition including repeating treatment with carboplatin  and gemcitabine  and the patient did actually have good response to this treatment versus treatment with Enfortumab Vedotin with Keytruda versus treatment with the oral erdafitinib.  I discussed all this option with the patient at this point and recommended for him to resume his treatment with  carboplatin  and gemcitabine  and we will monitor him for response to this treatment.  If the patient has a response to the regiment we will continue for up to 6 cycles followed by maintenance treatment with immunotherapy with avelumab and then we will disease progression we could treat him with enfortumab vedotin with Keytruda or oral erdafitinib. I discussed with the patient the adverse effect of the chemotherapy and he is very familiar with that since he was treated in the past. He is expected to start the first dose of this treatment after Christmas. He will come back for follow-up visit in 2 weeks for evaluation and management of any adverse effect of his treatment. The patient was advised to call immediately if he has any other concerning symptoms in the interval. Disclaimer: This note was dictated with voice recognition software. Similar sounding words can inadvertently be transcribed and may be missed upon review. Sherrod MARLA Sherrod, MD

## 2024-11-04 ENCOUNTER — Inpatient Hospital Stay (HOSPITAL_BASED_OUTPATIENT_CLINIC_OR_DEPARTMENT_OTHER): Admitting: Physician Assistant

## 2024-11-04 ENCOUNTER — Inpatient Hospital Stay

## 2024-11-04 VITALS — BP 128/74 | HR 83 | Temp 97.4°F | Resp 16 | Wt 140.0 lb

## 2024-11-04 DIAGNOSIS — C679 Malignant neoplasm of bladder, unspecified: Secondary | ICD-10-CM | POA: Diagnosis not present

## 2024-11-04 DIAGNOSIS — Z5112 Encounter for antineoplastic immunotherapy: Secondary | ICD-10-CM | POA: Diagnosis not present

## 2024-11-04 DIAGNOSIS — Z7189 Other specified counseling: Secondary | ICD-10-CM

## 2024-11-04 DIAGNOSIS — C678 Malignant neoplasm of overlapping sites of bladder: Secondary | ICD-10-CM | POA: Diagnosis not present

## 2024-11-04 DIAGNOSIS — C3491 Malignant neoplasm of unspecified part of right bronchus or lung: Secondary | ICD-10-CM | POA: Diagnosis not present

## 2024-11-04 LAB — CBC WITH DIFFERENTIAL (CANCER CENTER ONLY)
Abs Immature Granulocytes: 0.04 K/uL (ref 0.00–0.07)
Basophils Absolute: 0 K/uL (ref 0.0–0.1)
Basophils Relative: 1 %
Eosinophils Absolute: 0.4 K/uL (ref 0.0–0.5)
Eosinophils Relative: 5 %
HCT: 37.5 % — ABNORMAL LOW (ref 39.0–52.0)
Hemoglobin: 12.4 g/dL — ABNORMAL LOW (ref 13.0–17.0)
Immature Granulocytes: 1 %
Lymphocytes Relative: 9 %
Lymphs Abs: 0.7 K/uL (ref 0.7–4.0)
MCH: 26.6 pg (ref 26.0–34.0)
MCHC: 33.1 g/dL (ref 30.0–36.0)
MCV: 80.5 fL (ref 80.0–100.0)
Monocytes Absolute: 0.7 K/uL (ref 0.1–1.0)
Monocytes Relative: 10 %
Neutro Abs: 5.6 K/uL (ref 1.7–7.7)
Neutrophils Relative %: 74 %
Platelet Count: 245 K/uL (ref 150–400)
RBC: 4.66 MIL/uL (ref 4.22–5.81)
RDW: 14.3 % (ref 11.5–15.5)
WBC Count: 7.5 K/uL (ref 4.0–10.5)
nRBC: 0 % (ref 0.0–0.2)

## 2024-11-04 LAB — CMP (CANCER CENTER ONLY)
ALT: 13 U/L (ref 0–44)
AST: 23 U/L (ref 15–41)
Albumin: 4.2 g/dL (ref 3.5–5.0)
Alkaline Phosphatase: 109 U/L (ref 38–126)
Anion gap: 11 (ref 5–15)
BUN: 25 mg/dL — ABNORMAL HIGH (ref 8–23)
CO2: 24 mmol/L (ref 22–32)
Calcium: 9.3 mg/dL (ref 8.9–10.3)
Chloride: 104 mmol/L (ref 98–111)
Creatinine: 1.04 mg/dL (ref 0.61–1.24)
GFR, Estimated: >60 mL/min
Glucose, Bld: 96 mg/dL (ref 70–99)
Potassium: 3.9 mmol/L (ref 3.5–5.1)
Sodium: 139 mmol/L (ref 135–145)
Total Bilirubin: 0.6 mg/dL (ref 0.0–1.2)
Total Protein: 7.7 g/dL (ref 6.5–8.1)

## 2024-11-04 LAB — TSH: TSH: 3.32 u[IU]/mL (ref 0.350–4.500)

## 2024-11-04 MED ORDER — PROCHLORPERAZINE MALEATE 10 MG PO TABS: 10.0000 mg | ORAL_TABLET | Freq: Four times a day (QID) | ORAL | 2 refills | Status: AC | PRN

## 2024-11-04 NOTE — Progress Notes (Signed)
 START OFF PATHWAY REGIMEN - Bladder   [id number]:Carboplatin  AUC=5 IV D1 + Gemcitabine  1,000 mg/m2 IV D1,8 q21 Days:   A cycle is every 21 days:     Gemcitabine       Carboplatin    **Always confirm dose/schedule in your pharmacy ordering system**  Clinician Citation:   Patient Characteristics: Advanced/Metastatic Disease, First Line Therapeutic Status: Advanced/Metastatic Disease Line of Therapy: First Line Intent of Therapy: Non-Curative / Palliative Intent, Not Discussed with Patient

## 2024-11-05 LAB — T4: T4, Total: 8.3 ug/dL (ref 4.5–12.0)

## 2024-11-07 ENCOUNTER — Telehealth: Payer: Self-pay | Admitting: Medical Oncology

## 2024-11-07 NOTE — Telephone Encounter (Signed)
 Pt notified that his next appt  are on 01/12 . But will not get Libtayo  on 01/12 and I told him if chair time allows we may be able to give his new treatment at that time.

## 2024-11-10 ENCOUNTER — Other Ambulatory Visit: Payer: Self-pay

## 2024-11-11 NOTE — Progress Notes (Signed)
 Pharmacist Chemotherapy Monitoring - Initial Assessment    Anticipated start date: 11/12/24   The following has been reviewed per standard work regarding the patient's treatment regimen: The patient's diagnosis, treatment plan and drug doses, and organ/hematologic function Lab orders and baseline tests specific to treatment regimen  The treatment plan start date, drug sequencing, and pre-medications Prior authorization status  Patient's documented medication list, including drug-drug interaction screen and prescriptions for anti-emetics and supportive care specific to the treatment regimen The drug concentrations, fluid compatibility, administration routes, and timing of the medications to be used The patient's access for treatment and lifetime cumulative dose history, if applicable  The patient's medication allergies and previous infusion related reactions, if applicable   Changes made to treatment plan:  increased premeds d/t cumulative number Carbo doses per protocol. Cetirizine selected for adv age (vs Diphenhydramine )  Follow up needed:  N/A   Alan Mendez, Pharm.D., CPP 11/11/2024@2 :59 PM

## 2024-11-12 ENCOUNTER — Telehealth: Payer: Self-pay | Admitting: Physician Assistant

## 2024-11-12 ENCOUNTER — Inpatient Hospital Stay

## 2024-11-12 ENCOUNTER — Other Ambulatory Visit: Payer: Self-pay

## 2024-11-12 VITALS — BP 139/81 | HR 76 | Temp 97.8°F | Resp 16 | Wt 140.8 lb

## 2024-11-12 DIAGNOSIS — C678 Malignant neoplasm of overlapping sites of bladder: Secondary | ICD-10-CM

## 2024-11-12 DIAGNOSIS — Z5112 Encounter for antineoplastic immunotherapy: Secondary | ICD-10-CM | POA: Diagnosis not present

## 2024-11-12 LAB — CMP (CANCER CENTER ONLY)
ALT: 16 U/L (ref 0–44)
AST: 26 U/L (ref 15–41)
Albumin: 4.1 g/dL (ref 3.5–5.0)
Alkaline Phosphatase: 111 U/L (ref 38–126)
Anion gap: 10 (ref 5–15)
BUN: 24 mg/dL — ABNORMAL HIGH (ref 8–23)
CO2: 25 mmol/L (ref 22–32)
Calcium: 9.4 mg/dL (ref 8.9–10.3)
Chloride: 105 mmol/L (ref 98–111)
Creatinine: 1.03 mg/dL (ref 0.61–1.24)
GFR, Estimated: >60 mL/min
Glucose, Bld: 122 mg/dL — ABNORMAL HIGH (ref 70–99)
Potassium: 3.8 mmol/L (ref 3.5–5.1)
Sodium: 140 mmol/L (ref 135–145)
Total Bilirubin: 0.6 mg/dL (ref 0.0–1.2)
Total Protein: 7.4 g/dL (ref 6.5–8.1)

## 2024-11-12 LAB — CBC WITH DIFFERENTIAL (CANCER CENTER ONLY)
Abs Immature Granulocytes: 0.03 K/uL (ref 0.00–0.07)
Basophils Absolute: 0 K/uL (ref 0.0–0.1)
Basophils Relative: 0 %
Eosinophils Absolute: 0.4 K/uL (ref 0.0–0.5)
Eosinophils Relative: 6 %
HCT: 37.9 % — ABNORMAL LOW (ref 39.0–52.0)
Hemoglobin: 12.4 g/dL — ABNORMAL LOW (ref 13.0–17.0)
Immature Granulocytes: 0 %
Lymphocytes Relative: 11 %
Lymphs Abs: 0.8 K/uL (ref 0.7–4.0)
MCH: 26.6 pg (ref 26.0–34.0)
MCHC: 32.7 g/dL (ref 30.0–36.0)
MCV: 81.2 fL (ref 80.0–100.0)
Monocytes Absolute: 0.7 K/uL (ref 0.1–1.0)
Monocytes Relative: 10 %
Neutro Abs: 5.1 K/uL (ref 1.7–7.7)
Neutrophils Relative %: 73 %
Platelet Count: 247 K/uL (ref 150–400)
RBC: 4.67 MIL/uL (ref 4.22–5.81)
RDW: 14.7 % (ref 11.5–15.5)
WBC Count: 7 K/uL (ref 4.0–10.5)
nRBC: 0 % (ref 0.0–0.2)

## 2024-11-12 MED ORDER — CETIRIZINE HCL 10 MG/ML IV SOLN
10.0000 mg | Freq: Once | INTRAVENOUS | Status: AC
  Administered 2024-11-12: 10 mg via INTRAVENOUS
  Filled 2024-11-12: qty 1

## 2024-11-12 MED ORDER — APREPITANT 130 MG/18ML IV EMUL
130.0000 mg | Freq: Once | INTRAVENOUS | Status: AC
  Administered 2024-11-12: 130 mg via INTRAVENOUS
  Filled 2024-11-12: qty 18

## 2024-11-12 MED ORDER — SODIUM CHLORIDE 0.9 % IV SOLN
375.0000 mg | Freq: Once | INTRAVENOUS | Status: AC
  Administered 2024-11-12: 380 mg via INTRAVENOUS
  Filled 2024-11-12: qty 38

## 2024-11-12 MED ORDER — DEXAMETHASONE SOD PHOSPHATE PF 10 MG/ML IJ SOLN
10.0000 mg | Freq: Once | INTRAMUSCULAR | Status: AC
  Administered 2024-11-12: 10 mg via INTRAVENOUS

## 2024-11-12 MED ORDER — PALONOSETRON HCL INJECTION 0.25 MG/5ML
0.2500 mg | Freq: Once | INTRAVENOUS | Status: AC
  Administered 2024-11-12: 0.25 mg via INTRAVENOUS
  Filled 2024-11-12: qty 5

## 2024-11-12 MED ORDER — SODIUM CHLORIDE 0.9 % IV SOLN: INTRAVENOUS | Status: DC

## 2024-11-12 MED ORDER — SODIUM CHLORIDE 0.9 % IV SOLN
1000.0000 mg/m2 | Freq: Once | INTRAVENOUS | Status: AC
  Administered 2024-11-12: 1710 mg via INTRAVENOUS
  Filled 2024-11-12: qty 44.97

## 2024-11-12 MED ORDER — FAMOTIDINE IN NACL 20-0.9 MG/50ML-% IV SOLN
20.0000 mg | Freq: Once | INTRAVENOUS | Status: AC
  Administered 2024-11-12: 20 mg via INTRAVENOUS
  Filled 2024-11-12: qty 50

## 2024-11-12 NOTE — Telephone Encounter (Signed)
 Left two voice mails informing the pt of the changes

## 2024-11-12 NOTE — Patient Instructions (Signed)
 [facility] - A DEPT OF [facility]  Discharge Instructions: Thank you for choosing [facility] to provide your oncology and hematology care.   If you have a lab appointment with the Cancer Center, please go directly to the Cancer Center and check in at the registration area.   Wear comfortable clothing and clothing appropriate for easy access to any Portacath or PICC line.   We strive to give you quality time with your provider. You may need to reschedule your appointment if you arrive late (15 or more minutes).  Arriving late affects you and other patients whose appointments are after yours.  Also, if you miss three or more appointments without notifying the office, you may be dismissed from the clinic at the providers discretion.      For prescription refill requests, have your pharmacy contact our office and allow 72 hours for refills to be completed.    Today you received the following chemotherapy and/or immunotherapy agents: Gemcitabine  (Gemzar ), Carboplatin  , (Paraplatin )     To help prevent nausea and vomiting after your treatment, we encourage you to take your nausea medication as directed.  BELOW ARE SYMPTOMS THAT SHOULD BE REPORTED IMMEDIATELY: *FEVER GREATER THAN 100.4 F (38 C) OR HIGHER *CHILLS OR SWEATING *NAUSEA AND VOMITING THAT IS NOT CONTROLLED WITH YOUR NAUSEA MEDICATION *UNUSUAL SHORTNESS OF BREATH *UNUSUAL BRUISING OR BLEEDING *URINARY PROBLEMS (pain or burning when urinating, or frequent urination) *BOWEL PROBLEMS (unusual diarrhea, constipation, pain near the anus) TENDERNESS IN MOUTH AND THROAT WITH OR WITHOUT PRESENCE OF ULCERS (sore throat, sores in mouth, or a toothache) UNUSUAL RASH, SWELLING OR PAIN  UNUSUAL VAGINAL DISCHARGE OR ITCHING   Items with * indicate a potential emergency and should be followed up as soon as possible or go to the Emergency Department if any problems should occur.  Please show the  CHEMOTHERAPY ALERT CARD or IMMUNOTHERAPY ALERT CARD at check-in to the Emergency Department and triage nurse.  Should you have questions after your visit or need to cancel or reschedule your appointment, please contact [facility] - A DEPT OF [facility]  Dept: [phone]  and follow the prompts.  Office hours are 8:00 a.m. to 4:30 p.m. Monday - Friday. Please note that voicemails left after 4:00 p.m. may not be returned until the following business day.  We are closed weekends and major holidays. You have access to a nurse at all times for urgent questions. Please call the main number to the clinic Dept: [phone] and follow the prompts.   For any non-urgent questions, you may also contact your provider using MyChart. We now offer e-Visits for anyone 58 and older to request care online for non-urgent symptoms. For details visit [email or web address].   Also download the MyChart app! Go to the app store, search MyChart, open the app, select [place], and log in with your MyChart username and password.

## 2024-11-12 NOTE — Telephone Encounter (Signed)
 Left a voicemail to the pt of the rescheduled appt  per req cass

## 2024-11-14 NOTE — Progress Notes (Unsigned)
 [facility] OFFICE PROGRESS NOTE  [name], Charlie ORN, MD [address] [place] [address]  DIAGNOSIS:  1)He was initially diagnosed with Stage IIIb (T3, N2, M0) non-small cell lung cancer, squamous cell carcinoma presented with 2 separate right upper lobe lung nodule in addition to mediastinal lymphadenopathy diagnosed in January 2024. In retrospect when this was compared to his tissue from 2018 and feel that this is consistent with metastatic bladder cancer.  2) left thyroid  nodule.    PDL1 expression: 1%    Molecular: FGFR Detected Alteration(s) / Biomarker(s) Associated FDA-approved therapies  Clinical Trial Availability       FGFR3 Y373C   approved in other indication Erdafitinib Yes PTEN Q217fs   approved in other indication Capivasertib Yes  PRIOR THERAPY: 1) right nephroureterectomy on November 15, 2016 by Dr. Cam and the final pathology showed in situ (noninvasive) low-grade papillary urothelial carcinoma measuring 4.0 cm involving the renal pelvis with benign simple cyst and all the margins of resection were negative for carcinoma. The patient was also found to have similar findings in the bladder with biopsy showing focal invasive high-grade papillary urothelial carcinoma and the carcinoma invades the lamina propria at a level above the muscularis mucosa but the muscularis propria was present and not involved. He was treated several times with intravesical BCG until November 2018.  2) A course of concurrent chemoradiation with weekly carboplatin  for AUC of 2 and paclitaxel  45 Mg/M2. Last dose on 02/05/23. Status post 6 cycles. Taxol  changed to abraxane  starting from cycle #4 due to reaction to taxol   3) Consolidation immunotherapy with Imfinzi  1500 mg IV every 4 weeks.  First dose expected on 03/12/23.  Status post 7 cycles. 4) CT-guided pulsed field electroporation of the right suprahilar mass under the care of Dr. Sharron at [facility] on  02/12/2024. 5) Systemic chemotherapy with Carboplatin  for AUC of 5 on day 1, gemcitabine  1000 Mg/M2 on days 1 and 8 as well as Libtayo  (Cempilimab) 350 Mg IV on day 1 every 3 weeks.  First dose March 06, 2024.  Status post 4 cycles 6) procedure at [facility] with interventional radiology for pulse field electroporation. 7) Maintenance treatment with immunotherapy with Libtayo  (Cempilimab) 350 mg IV every 3 weeks status post 7 cycles.  CURRENT THERAPY: Systemic chemotherapy with carboplatin  for an AUC of 5 and gemcitabine  on days 1 and 3 IV every 3 weeks. First dose on 11/12/24  INTERVAL HISTORY: Alan Mendez 84 y.o. male returns to the clinic today for a follow-up visit.   The patient was last seen in the clinic by Dr. Sherrod and myself on 11/04/24.   At that point in time he was found to have metastatic urothelial carcinoma.   He started chemotherapy with carboplatin  and gemzar . He is status post day 1 cycle #1 and tolerated it well except for fatigue and taste changes but overall he is managing. He does try to stay active. He used to road bike but is contemplating using stationary bike when he starts biking again due to decreased stability with chemo.   He notes easy bruising that resolves quickly. He denies nausea, vomiting, diarrhea, or urinary symptoms.  He denies oral thrush. He experienced chills during the first few days after treatment, which he attributes to cold weather, but denies fevers, night sweats, or persistent chills. No skin infections are present.  He has a chronic cough and throat irritation that have persisted for 2-3 years following prior radiation therapy, with symptoms exacerbated by  congestion and sinus issues. He experiences mild exertional dyspnea, particularly when climbing stairs, which he attributes to chronic cough and throat changes. He is able to swallow but requires caution and cuts food into smaller pieces due to a sensation of narrowing since radiation. He has not  undergone endoscopic evaluation or dilation, though this was previously suggested, and is able to manage current symptoms without intervention.   He is here today for evaluation and repeat blood work before undergoing day 8 cycle 1 which is scheduled for tomorrow.   MEDICAL HISTORY: Past Medical History:  Diagnosis Date   Acquired solitary kidney    left --  s/p  right nephroureterctomy 01/ 2018   Anxiety    no current problems   Depression    no current problems   Elevated PSA    Enlarged lymph node 11/2022   enlarged right paratracheal lymph node.   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, negative margins)   History of kidney stones    passed stones and also surgery to remove   History of unilateral nephrectomy    01/ 2018  right nephroureterectomy for renal pelvis mass (carcinoma in situ)   HLD (hyperlipidemia)    on crestor    Hyperplasia of prostate without lower urinary tract symptoms (LUTS)    Lung cancer (HCC) 12/07/2022   Nephrolithiasis    bilateral non-obstructive   Pneumonia    x 1 - yrs ago   Pulmonary nodules 11/2022   2 right upper lobe pulmonary nodules   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HCC)    Renal cyst, acquired, right    right kidney removed   Wears glasses     ALLERGIES:  is allergic to lamisil [terbinafine] and ivp dye [iodinated contrast media].  MEDICATIONS:  Current Outpatient Medications  Medication Sig Dispense Refill   cholecalciferol  (VITAMIN D3) 25 MCG (1000 UNIT) tablet Take 1,000 Units by mouth daily.     diphenhydrAMINE  (BENADRYL ) 50 MG tablet Take 1 tablet 2 hours before the CT scan. 1 tablet 0   lidocaine -prilocaine  (EMLA ) cream Apply small amount cream to port a cath site 30-60 minutes prior to accessing port 30  g 0   Multiple Vitamins-Minerals (MULTIVITAMIN WITH MINERALS) tablet Take 1 tablet by mouth daily. Centrum     predniSONE  (DELTASONE ) 50 MG tablet 1 tablet 13 hours before then 7 hours before then 2 hours before the CT scan. Please take Benadryl  over-the-counter 50 mg 1 time 2 hours before the scan with the last dose of prednisone . 3 tablet 2   prochlorperazine  (COMPAZINE ) 10 MG tablet Take 10 mg by mouth every 6 (six) hours as needed for nausea or vomiting.     prochlorperazine  (COMPAZINE ) 10 MG tablet Take 1 tablet (10 mg total) by mouth every 6 (six) hours as needed. 30 tablet 2   rosuvastatin  (CRESTOR ) 10 MG tablet TAKE 1 TABLET EACH DAY. (Patient taking differently: Take 20 mg by mouth daily.) 30 tablet 0   silver  sulfADIAZINE  (SILVADENE ) 1 % cream Apply 1 Application topically daily. 50 g 0   sucralfate  (CARAFATE ) 1 g tablet Take 1 tablet (1 g total) by mouth 4 (four) times daily -  with meals and at bedtime. Dissolve tablet in water  -swish and swallow. 30 tablet 2  tamsulosin  (FLOMAX ) 0.4 MG CAPS capsule Take 1 capsule (0.4 mg total) by mouth daily. (Patient taking differently: Take 0.4 mg by mouth daily after breakfast.) 30 capsule 5   No current facility-administered medications for this visit.    SURGICAL HISTORY:  Past Surgical History:  Procedure Laterality Date   APPENDECTOMY  child   BRONCHIAL BIOPSY  12/07/2022   Procedure: BRONCHIAL BIOPSIES;  Surgeon: Brenna Adine CROME, DO;  Location: [facility];  Service: Pulmonary;;   BRONCHIAL BRUSHINGS  12/07/2022   Procedure: BRONCHIAL BRUSHINGS;  Surgeon: Brenna Adine CROME, DO;  Location: [facility];  Service: Pulmonary;;   BRONCHIAL NEEDLE ASPIRATION BIOPSY  12/07/2022   Procedure: BRONCHIAL NEEDLE ASPIRATION BIOPSIES;  Surgeon: Brenna Adine CROME, DO;  Location: [facility];  Service: Pulmonary;;   CATARACT EXTRACTION W/ INTRAOCULAR LENS  IMPLANT, BILATERAL  2017   COLONOSCOPY  08/15/2012   Tubular Adenoma, No high grade dysplasia or  malignacy.   CYSTOSCOPY W/ RETROGRADES Right 09/25/2016   Procedure: CYSTOSCOPY, URETHRAL DILITATION  WITH RETROGRADE PYELOGRAM, BRUSH BIOPSIES OF RIGHT RENAL MASS;  Surgeon: Arlena Gal, MD;  Location: [facility];  Service: Urology;  Laterality: Right;   CYSTOSCOPY W/ RETROGRADES Left 03/16/2017   Procedure: CYSTOSCOPY WITH RETROGRADE PYELOGRAM;  Surgeon: Cam Morene ORN, MD;  Location: [facility];  Service: Urology;  Laterality: Left;   CYSTOSCOPY W/ URETERAL STENT REMOVAL Right 09/25/2016   Procedure: CYSTOSCOPY WITH STENT REMOVAL;  Surgeon: Arlena Gal, MD;  Location: [facility];  Service: Urology;  Laterality: Right;   CYSTOSCOPY WITH RETROGRADE PYELOGRAM, URETEROSCOPY AND STENT PLACEMENT Right 08/21/2016   Procedure: CYSTOSCOPY WITH RIGHT RETROGRADE PYELOGRAM, RIGHT FLEXIBLE AND RIGID URETEROSCOPY, INSERTION DOUBLE J STENT RIGHT;  Surgeon: Arlena Gal, MD;  Location: [facility] ;  Service: Urology;  Laterality: Right;   EXTRACORPOREAL SHOCK WAVE LITHOTRIPSY  2009   IR IMAGING GUIDED PORT INSERTION  07/09/2023   KNEE SURGERY Right 1980's   ROBOT ASSITED LAPAROSCOPIC NEPHROURETERECTOMY Right 11/15/2016   Procedure: RIGHT XI ROBOT ASSITED LAPAROSCOPIC NEPHROURETERECTOMY;  Surgeon: Morene ORN Cam, MD;  Location: [facility];  Service: Urology;  Laterality: Right;   TRANSURETHRAL RESECTION OF BLADDER TUMOR WITH MITOMYCIN -C Left 03/16/2017   Procedure: CYSTOSCOPY BIOPSIES OF BLADDER TUMOR WITH FULGURATION;  Surgeon: Cam Morene ORN, MD;  Location: [facility];  Service: Urology;  Laterality: Left;   URETEROSCOPY Right 09/25/2016   Procedure: RIGHT URETEROSCOPY;  Surgeon: Arlena Gal, MD;  Location: [facility];  Service: Urology;  Laterality: Right;   VIDEO BRONCHOSCOPY WITH ENDOBRONCHIAL ULTRASOUND Right 12/07/2022   Procedure: VIDEO BRONCHOSCOPY WITH ENDOBRONCHIAL ULTRASOUND;   Surgeon: Brenna Adine CROME, DO;  Location: [facility];  Service: Pulmonary;  Laterality: Right;    REVIEW OF SYSTEMS:   Review of Systems  Constitutional: Positive for fatigue. Negative for appetite change, chills, fever and unexpected weight change.  HENT: Positive for taste changes and trouble swallowing. Negative for mouth sores, nosebleeds, sore throat. Eyes: Negative for eye problems and icterus.  Respiratory: Positive for mild occasional dyspnea on exertion and intermittent cough. Negative for hemoptysis and wheezing.   Cardiovascular: Negative for chest pain and leg swelling.  Gastrointestinal: Negative for abdominal pain, constipation, diarrhea, nausea and vomiting.  Genitourinary: Negative for bladder incontinence, difficulty urinating, dysuria, frequency and hematuria.   Musculoskeletal: Negative for back pain, gait problem, neck pain and neck stiffness.  Skin: Negative for itching and rash.  Neurological: Negative for dizziness, extremity weakness, gait problem, headaches, light-headedness and  seizures.  Hematological: Negative for adenopathy. Does not bruise/bleed easily.  Psychiatric/Behavioral: Negative for confusion, depression and sleep disturbance. The patient is not nervous/anxious.     PHYSICAL EXAMINATION:  Blood pressure 95/73, pulse 79, temperature 97.9 F (36.6 C), temperature source Temporal, resp. rate 16, height 5' 5 (1.651 m), weight 140 lb (63.5 kg), SpO2 100%.  ECOG PERFORMANCE STATUS: 1  Physical Exam  Constitutional: Oriented to person, place, and time and well-developed, well-nourished, and in no distress.  HENT:  Head: Normocephalic and atraumatic.  Mouth/Throat: Oropharynx is clear and moist. No oropharyngeal exudate.  Eyes: Conjunctivae are normal. Right eye exhibits no discharge. Left eye exhibits no discharge. No scleral icterus.  Neck: Normal range of motion. Neck supple.  Cardiovascular: Normal rate, regular rhythm, normal heart sounds and  intact distal pulses.   Pulmonary/Chest: Effort normal and breath sounds normal. No respiratory distress. No wheezes. No rales.  Abdominal: Soft. Bowel sounds are normal. Exhibits no distension and no mass. There is no tenderness.  Musculoskeletal: Normal range of motion. Exhibits no edema.  Lymphadenopathy:    No cervical adenopathy.  Neurological: Alert and oriented to person, place, and time. Exhibits normal muscle tone. Gait normal. Coordination normal.  Skin: Skin is warm and dry. No rash noted. Not diaphoretic. No erythema. No pallor.  Psychiatric: Mood, memory and judgment normal.  Vitals reviewed.  LABORATORY DATA: Lab Results  Component Value Date   WBC 3.0 (L) 11/18/2024   HGB 11.9 (L) 11/18/2024   HCT 36.1 (L) 11/18/2024   MCV 80.9 11/18/2024   PLT 177 11/18/2024      Chemistry      Component Value Date/Time   NA 140 11/12/2024 1307   K 3.8 11/12/2024 1307   CL 105 11/12/2024 1307   CO2 25 11/12/2024 1307   BUN 24 (H) 11/12/2024 1307   CREATININE 1.03 11/12/2024 1307   CREATININE 0.85 12/10/2014 1550      Component Value Date/Time   CALCIUM  9.4 11/12/2024 1307   ALKPHOS 111 11/12/2024 1307   AST 26 11/12/2024 1307   ALT 16 11/12/2024 1307   BILITOT 0.6 11/12/2024 1307       RADIOGRAPHIC STUDIES:  No results found.   ASSESSMENT/PLAN:  This is a very pleasant 84 year old Caucasian male diagnosed with his initially felt to be stage IIIb (T3, N2, M0) non-small cell lung cancer, squamous cell carcinoma. He presented with 2 separate right upper lobe lung nodules in addition to mediastinal lymphadenopathy. He was diagnosed in January 2024. His PDL1 expression is 1%.  However his tissue was compared in December 2025 to his 2018 urothelial carcinoma and was felt to actually be consistent with metastatic urothelial carcinoma as opposed to lung cancer.   He also has a history of right nephroureterectomy on November 15, 2016 by Dr. Cam and the final pathology showed  in situ (noninvasive) low-grade papillary urothelial carcinoma measuring 4.0 cm involving the renal pelvis with benign simple cyst and all the margins of resection were negative for carcinoma. The patient was also found to have similar findings in the bladder with biopsy showing focal invasive high-grade papillary urothelial carcinoma and the carcinoma invades the lamina propria at a level above the muscularis mucosa but the muscularis propria was present and not involved. He was treated several times with intravesical BCG until November 2018.    The patient underwent a course of concurrent chemoradiation with weekly carboplatin  for AUC of 2 and paclitaxel  45 Mg/M2.  Status post 6 cycles. Starting  from cycle #4, the taxol  was switched to abraxane  due to an infusion reaction to taxol .  The patient's last chemotherapy was on 02/05/2023. He saw Dr. Dewey from radiation oncology.    He then was on consolidation immunotherapy with Imfinzi  1500 milligrams IV every 4 weeks.  He is status post 7 cycles. His last dose was on 08/27/23.   His CT scan from October 2024 showed parahilar scarring/fibrosis. Small nodules in the medial suprahilar right lung previously have become incorporated into this more confluent soft tissue density showing a masslike nodular character measuring 4.3 x 1.7 cm. While this masslike component may be related to confluent scarring, recurrent/residual disease is a concern per radiology.   Dr. Sherrod recommended a PET scan to further evaluate. The scan showed intensely hypermetabolic solid 4.2 cm medial right upper lobe lung mass, compatible with persistent viable malignancy, with surrounding evolving post radiation change.     The patient saw Dr. Starlin at [facility]. The patient was not a surgical candidate.    The patient underwent CT-guided pulsed field electroporation of the right suprahilar mass under the care of Dr. Sharron at [facility] on 02/12/2024.   The patient is  currently undergoing palliative systemic chemotherapy and immunotherapy with carboplatin  for AUC of 5 on day 1, gemcitabine  1000 mg on days 1 and 8, and Libtayo  350 mg on day 1 IV every 3 weeks.  The patient is status post 4 cycles of treatment. He is currently on maintenance immunotherapy with libtayo .    The patient was seen at [facility].  He underwent repeat pulse field electroporation December 2025.   This tissue was sent to [facility] for second opinion and they felt that the pathology and the additional staining was consistent with metastatic urothelial carcinoma as opposed to primary lung cancer.  This was reviewed again by our pathology lab who is in agreement.   Dr. Sherrod recommended startng him on palliative systemic chemotherapy for 6 cycles with carboplatin  for an AUC of 5 and gemcitabine  on days 1 and 8 IV every 3 weeks.  The patient is interested in this option and he is expected to start next week on 11/12/2024.  He started carboplatin  and gemcitabine  on 11/12/24. He is status post day 1 cycle 1.   Labs were reviewed. Recommend he proceed with day 8 cycle #1 tomorrow as scheduled.   After 6 cycles Dr. Sherrod would start him on maintenance immunotherapy with Avelumab.    Expected to have a prostate scan in January 2026.   He will continue to follow with Dr. Cam from urology.   If progression, then Dr. Sherrod would recommend treatment with Enfortumab and Keytruda. Other option includes Erdafitinib   We will see him in 2 weeks before day 1 cycle #2.   Radiation-induced esophageal stricture with dysphagia Chronic esophageal stricture secondary to prior radiation causing persistent dysphagia. Declined endoscopic evaluation unless symptoms worsen. - If dysphagia worsens, will refer to gastroenterology for endoscopic evaluation and possible dilation.  The patient was advised to call immediately if she has any concerning symptoms in the interval. The patient voices  understanding of current disease status and treatment options and is in agreement with the current care plan. All questions were answered. The patient knows to call the clinic with any problems, questions or concerns. We can certainly see the patient much sooner if necessary        No orders of the defined types were placed in this encounter.    The  total time spent in the appointment was 20-29 minutes  Katianna Mcclenney L Christropher Gintz, PA-C 11/18/2024

## 2024-11-16 ENCOUNTER — Other Ambulatory Visit: Payer: Self-pay

## 2024-11-18 ENCOUNTER — Inpatient Hospital Stay: Attending: Internal Medicine | Admitting: Physician Assistant

## 2024-11-18 ENCOUNTER — Inpatient Hospital Stay

## 2024-11-18 VITALS — BP 95/73 | HR 79 | Temp 97.9°F | Resp 16 | Ht 65.0 in | Wt 140.0 lb

## 2024-11-18 DIAGNOSIS — C679 Malignant neoplasm of bladder, unspecified: Secondary | ICD-10-CM | POA: Insufficient documentation

## 2024-11-18 DIAGNOSIS — C3491 Malignant neoplasm of unspecified part of right bronchus or lung: Secondary | ICD-10-CM

## 2024-11-18 DIAGNOSIS — K222 Esophageal obstruction: Secondary | ICD-10-CM | POA: Diagnosis not present

## 2024-11-18 DIAGNOSIS — E041 Nontoxic single thyroid nodule: Secondary | ICD-10-CM | POA: Diagnosis not present

## 2024-11-18 DIAGNOSIS — C7800 Secondary malignant neoplasm of unspecified lung: Secondary | ICD-10-CM | POA: Insufficient documentation

## 2024-11-18 DIAGNOSIS — Z5111 Encounter for antineoplastic chemotherapy: Secondary | ICD-10-CM | POA: Insufficient documentation

## 2024-11-18 DIAGNOSIS — C678 Malignant neoplasm of overlapping sites of bladder: Secondary | ICD-10-CM

## 2024-11-18 LAB — CMP (CANCER CENTER ONLY)
ALT: 26 U/L (ref 0–44)
AST: 29 U/L (ref 15–41)
Albumin: 3.9 g/dL (ref 3.5–5.0)
Alkaline Phosphatase: 114 U/L (ref 38–126)
Anion gap: 10 (ref 5–15)
BUN: 28 mg/dL — ABNORMAL HIGH (ref 8–23)
CO2: 25 mmol/L (ref 22–32)
Calcium: 9.3 mg/dL (ref 8.9–10.3)
Chloride: 106 mmol/L (ref 98–111)
Creatinine: 1.04 mg/dL (ref 0.61–1.24)
GFR, Estimated: >60 mL/min
Glucose, Bld: 110 mg/dL — ABNORMAL HIGH (ref 70–99)
Potassium: 4.3 mmol/L (ref 3.5–5.1)
Sodium: 140 mmol/L (ref 135–145)
Total Bilirubin: 0.3 mg/dL (ref 0.0–1.2)
Total Protein: 7.2 g/dL (ref 6.5–8.1)

## 2024-11-18 LAB — CBC WITH DIFFERENTIAL (CANCER CENTER ONLY)
Abs Immature Granulocytes: 0.01 K/uL (ref 0.00–0.07)
Basophils Absolute: 0 K/uL (ref 0.0–0.1)
Basophils Relative: 1 %
Eosinophils Absolute: 0.2 K/uL (ref 0.0–0.5)
Eosinophils Relative: 5 %
HCT: 36.1 % — ABNORMAL LOW (ref 39.0–52.0)
Hemoglobin: 11.9 g/dL — ABNORMAL LOW (ref 13.0–17.0)
Immature Granulocytes: 0 %
Lymphocytes Relative: 13 %
Lymphs Abs: 0.4 K/uL — ABNORMAL LOW (ref 0.7–4.0)
MCH: 26.7 pg (ref 26.0–34.0)
MCHC: 33 g/dL (ref 30.0–36.0)
MCV: 80.9 fL (ref 80.0–100.0)
Monocytes Absolute: 0.1 K/uL (ref 0.1–1.0)
Monocytes Relative: 2 %
Neutro Abs: 2.4 K/uL (ref 1.7–7.7)
Neutrophils Relative %: 79 %
Platelet Count: 177 K/uL (ref 150–400)
RBC: 4.46 MIL/uL (ref 4.22–5.81)
RDW: 14.1 % (ref 11.5–15.5)
WBC Count: 3 K/uL — ABNORMAL LOW (ref 4.0–10.5)
nRBC: 0 % (ref 0.0–0.2)

## 2024-11-19 ENCOUNTER — Inpatient Hospital Stay: Admitting: Physician Assistant

## 2024-11-19 ENCOUNTER — Inpatient Hospital Stay

## 2024-11-19 ENCOUNTER — Inpatient Hospital Stay: Attending: Internal Medicine

## 2024-11-19 VITALS — BP 131/63 | HR 95 | Temp 97.6°F | Resp 18

## 2024-11-19 DIAGNOSIS — C678 Malignant neoplasm of overlapping sites of bladder: Secondary | ICD-10-CM

## 2024-11-19 DIAGNOSIS — Z5111 Encounter for antineoplastic chemotherapy: Secondary | ICD-10-CM | POA: Diagnosis not present

## 2024-11-19 MED ORDER — SODIUM CHLORIDE 0.9 % IV SOLN
1000.0000 mg/m2 | Freq: Once | INTRAVENOUS | Status: AC
  Administered 2024-11-19: 1710 mg via INTRAVENOUS
  Filled 2024-11-19: qty 44.97

## 2024-11-19 MED ORDER — SODIUM CHLORIDE 0.9 % IV SOLN: INTRAVENOUS | Status: DC

## 2024-11-19 MED ORDER — PROCHLORPERAZINE MALEATE 10 MG PO TABS
10.0000 mg | ORAL_TABLET | Freq: Once | ORAL | Status: AC
  Administered 2024-11-19: 10 mg via ORAL
  Filled 2024-11-19: qty 1

## 2024-11-19 NOTE — Patient Instructions (Signed)
 [facility] - A DEPT OF [facility]  Discharge Instructions: Thank you for choosing [facility] to provide your oncology and hematology care.   If you have a lab appointment with the Cancer Center, please go directly to the Cancer Center and check in at the registration area.   Wear comfortable clothing and clothing appropriate for easy access to any Portacath or PICC line.   We strive to give you quality time with your provider. You may need to reschedule your appointment if you arrive late (15 or more minutes).  Arriving late affects you and other patients whose appointments are after yours.  Also, if you miss three or more appointments without notifying the office, you may be dismissed from the clinic at the provider's discretion.      For prescription refill requests, have your pharmacy contact our office and allow 72 hours for refills to be completed.    Today you received the following chemotherapy and/or immunotherapy agents: Gemcitabine (Gemzar)      To help prevent nausea and vomiting after your treatment, we encourage you to take your nausea medication as directed.  BELOW ARE SYMPTOMS THAT SHOULD BE REPORTED IMMEDIATELY: *FEVER GREATER THAN 100.4 F (38 C) OR HIGHER *CHILLS OR SWEATING *NAUSEA AND VOMITING THAT IS NOT CONTROLLED WITH YOUR NAUSEA MEDICATION *UNUSUAL SHORTNESS OF BREATH *UNUSUAL BRUISING OR BLEEDING *URINARY PROBLEMS (pain or burning when urinating, or frequent urination) *BOWEL PROBLEMS (unusual diarrhea, constipation, pain near the anus) TENDERNESS IN MOUTH AND THROAT WITH OR WITHOUT PRESENCE OF ULCERS (sore throat, sores in mouth, or a toothache) UNUSUAL RASH, SWELLING OR PAIN  UNUSUAL VAGINAL DISCHARGE OR ITCHING   Items with * indicate a potential emergency and should be followed up as soon as possible or go to the Emergency Department if any problems should occur.  Please show the CHEMOTHERAPY ALERT CARD or  IMMUNOTHERAPY ALERT CARD at check-in to the Emergency Department and triage nurse.  Should you have questions after your visit or need to cancel or reschedule your appointment, please contact [facility] - A DEPT OF [facility]  Dept: [phone]  and follow the prompts.  Office hours are 8:00 a.m. to 4:30 p.m. Monday - Friday. Please note that voicemails left after 4:00 p.m. may not be returned until the following business day.  We are closed weekends and major holidays. You have access to a nurse at all times for urgent questions. Please call the main number to the clinic Dept: [phone] and follow the prompts.   For any non-urgent questions, you may also contact your provider using MyChart. We now offer e-Visits for anyone 93 and older to request care online for non-urgent symptoms. For details visit [email or web address].   Also download the MyChart app! Go to the app store, search "MyChart", open the app, select [place], and log in with your MyChart username and password.

## 2024-11-22 ENCOUNTER — Other Ambulatory Visit: Payer: Self-pay

## 2024-11-23 ENCOUNTER — Encounter: Payer: Self-pay | Admitting: Internal Medicine

## 2024-11-24 ENCOUNTER — Inpatient Hospital Stay

## 2024-11-24 ENCOUNTER — Inpatient Hospital Stay: Admitting: Physician Assistant

## 2024-11-28 ENCOUNTER — Telehealth: Payer: Self-pay

## 2024-11-28 ENCOUNTER — Other Ambulatory Visit: Payer: Self-pay

## 2024-11-28 MED ORDER — PREDNISONE 10 MG PO TABS: 10.0000 mg | ORAL_TABLET | Freq: Every day | ORAL | 0 refills | Status: AC

## 2024-11-28 NOTE — Telephone Encounter (Signed)
 Spoke with patient regarding MyChart message. Patient reports rash has worsened. He states the rash is located near his buttocks, around his belt line, arms, arm pits, and chest. He describes the rash as not beet red and bumpy.  Patient was informed that Dr. Sherrod and Charlott, PA are not in the office today. Dr. Tina was contacted and informed that Dr. Sherrod previously noted steroids could be prescribed if the rash worsened. Dr. Tina sent in a prednisone  taper: 40 mg daily for 4 days, then 30 mg daily for 4 days, to be taken until patients appointment with Cassie, PA on 12/04/24 for reevaluation.  Explained to patient how to take prednisone . Advised patient to continue Cortizone cream as needed. Patient voiced understanding of the plan.

## 2024-12-01 ENCOUNTER — Other Ambulatory Visit: Payer: Self-pay

## 2024-12-01 NOTE — Progress Notes (Unsigned)
 [facility] OFFICE PROGRESS NOTE  Mendez, Alan ORN, MD [address] [place] [address]  DIAGNOSIS: 1)He was initially diagnosed with Stage IIIb (T3, N2, M0) non-small cell lung cancer, squamous cell carcinoma presented with 2 separate right upper lobe lung nodule in addition to mediastinal lymphadenopathy diagnosed in January 2024. In retrospect when this was compared to his tissue from 2018 and feel that this is consistent with metastatic bladder cancer.  2) left thyroid  nodule.    PDL1 expression: 1%    Molecular: FGFR Detected Alteration(s) / Biomarker(s) Associated FDA-approved therapies  Clinical Trial Availability       FGFR3 Y373C   approved in other indication Erdafitinib Yes PTEN Q219fs   approved in other indication Capivasertib Yes  PRIOR THERAPY: 1) right nephroureterectomy on November 15, 2016 by Dr. Cam and the final pathology showed in situ (noninvasive) low-grade papillary urothelial carcinoma measuring 4.0 cm involving the renal pelvis with benign simple cyst and all the margins of resection were negative for carcinoma. The patient was also found to have similar findings in the bladder with biopsy showing focal invasive high-grade papillary urothelial carcinoma and the carcinoma invades the lamina propria at a level above the muscularis mucosa but the muscularis propria was present and not involved. He was treated several times with intravesical BCG until November 2018.  2) A course of concurrent chemoradiation with weekly carboplatin  for AUC of 2 and paclitaxel  45 Mg/M2. Last dose on 02/05/23. Status post 6 cycles. Taxol  changed to abraxane  starting from cycle #4 due to reaction to taxol   3) Consolidation immunotherapy with Imfinzi  1500 mg IV every 4 weeks.  First dose expected on 03/12/23.  Status post 7 cycles. 4) CT-guided pulsed field electroporation of the right suprahilar mass under the care of Dr. Sharron at [facility] on  02/12/2024. 5) Systemic chemotherapy with Carboplatin  for AUC of 5 on day 1, gemcitabine  1000 Mg/M2 on days 1 and 8 as well as Libtayo  (Cempilimab) 350 Mg IV on day 1 every 3 weeks.  First dose March 06, 2024.  Status post 4 cycles 6) procedure at [facility] with interventional radiology for pulse field electroporation. 7) Maintenance treatment with immunotherapy with Libtayo  (Cempilimab) 350 mg IV every 3 weeks status post 7 cycles.  CURRENT THERAPY: Systemic chemotherapy with carboplatin  for an AUC of 5 and gemcitabine  on days 1 and 3 IV every 3 weeks. First dose on 11/12/24. Status post 1 cycles.   INTERVAL HISTORY: Alan Mendez 84 y.o. male returns to the clinic today for a follow-up visit.    The patient was last seen in the clinic by myself on 11/18/24.    At that point in time he was found to have metastatic urothelial carcinoma.    He started chemotherapy with carboplatin  and gemzar . He is status post cycle #1 and tolerated it    Premedications include cetirizine , famotidine , two antiemetics, and a steroid injection. He experiences increased fatigue and mild dyspnea during the first week of each cycle, with gradual improvement during the off week. He reports mild, transient dizziness with bending, progressive alopecia, and weight gain attributed to dietary changes secondary to persistent throat scarring and discomfort with rough foods. Appetite is decreased but he tolerates Italian cuisine. He denies fever, infection, vomiting, or increased cough; recent upper respiratory symptoms have resolved.  He developed a migratory, itching that causes rash following day 8 of chemotherapy.   The rash is currently mild and managed with topical hydrocortisone at night and a  short course of oral steroids, scheduled to complete tomorrow. He has not used oral antihistamines. He states that he is able to manage the rash.   The rash has improved over time and does not currently require escalation of  therapy.  He has had a persistently elevated PSA.  He had a MRI of the prostate and sees Dr. Cam from urology.  They were considering him for prostate biopsy.  The patient is wondering if he should have a prostate biopsy performed during the course of his chemotherapy or wait until after he completes his 6 cycles.   He is here today for evaluation and repeat blood work before undergoing cycle #2.    MEDICAL HISTORY: Past Medical History:  Diagnosis Date   Acquired solitary kidney    left --  s/p  right nephroureterctomy 01/ 2018   Anxiety    no current problems   Depression    no current problems   Elevated PSA    Enlarged lymph node 11/2022   enlarged right paratracheal lymph node.   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, negative margins)   History of kidney stones    passed stones and also surgery to remove   History of unilateral nephrectomy    01/ 2018  right nephroureterectomy for renal pelvis mass (carcinoma in situ)   HLD (hyperlipidemia)    on crestor    Hyperplasia of prostate without lower urinary tract symptoms (LUTS)    Lung cancer (HCC) 12/07/2022   Nephrolithiasis    bilateral non-obstructive   Pneumonia    x 1 - yrs ago   Pulmonary nodules 11/2022   2 right upper lobe pulmonary nodules   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HCC)    Renal cyst, acquired, right    right kidney removed   Wears glasses     ALLERGIES:  is allergic to lamisil [terbinafine] and ivp dye [iodinated contrast media].  MEDICATIONS:  Current Outpatient Medications  Medication Sig Dispense Refill   hydrOXYzine  (ATARAX ) 10 MG tablet Take 1 tablet (10 mg total) by mouth 3 (three) times daily as needed. 60 tablet 0   methylPREDNISolone  (MEDROL  DOSEPAK) 4 MG TBPK  tablet Use as instructed 21 tablet 0   cholecalciferol  (VITAMIN D3) 25 MCG (1000 UNIT) tablet Take 1,000 Units by mouth daily.     diphenhydrAMINE  (BENADRYL ) 50 MG tablet Take 1 tablet 2 hours before the CT scan. 1 tablet 0   lidocaine -prilocaine  (EMLA ) cream Apply small amount cream to port a cath site 30-60 minutes prior to accessing port 30 g 0   Multiple Vitamins-Minerals (MULTIVITAMIN WITH MINERALS) tablet Take 1 tablet by mouth daily. Centrum     predniSONE  (DELTASONE ) 10 MG tablet Take 1 tablet (10 mg total) by mouth daily with breakfast. Take 40 mg (4 tablets daily) by mouth in the AM for 4 days.  Then take 30 mg (3 tablets daily) in the AM for 4 days. 28 tablet 0   predniSONE  (DELTASONE ) 50 MG tablet 1 tablet 13 hours before then 7 hours before then 2 hours before the CT scan. Please take Benadryl  over-the-counter 50 mg 1 time 2 hours before the scan with the last dose of prednisone . 3 tablet 2   prochlorperazine  (COMPAZINE ) 10 MG tablet Take 10 mg  by mouth every 6 (six) hours as needed for nausea or vomiting.     prochlorperazine  (COMPAZINE ) 10 MG tablet Take 1 tablet (10 mg total) by mouth every 6 (six) hours as needed. 30 tablet 2   rosuvastatin  (CRESTOR ) 10 MG tablet TAKE 1 TABLET EACH DAY. (Patient taking differently: Take 20 mg by mouth daily.) 30 tablet 0   silver  sulfADIAZINE  (SILVADENE ) 1 % cream Apply 1 Application topically daily. 50 g 0   sucralfate  (CARAFATE ) 1 g tablet Take 1 tablet (1 g total) by mouth 4 (four) times daily -  with meals and at bedtime. Dissolve tablet in water  -swish and swallow. 30 tablet 2   tamsulosin  (FLOMAX ) 0.4 MG CAPS capsule Take 1 capsule (0.4 mg total) by mouth daily. (Patient taking differently: Take 0.4 mg by mouth daily after breakfast.) 30 capsule 5   No current facility-administered medications for this visit.   Facility-Administered Medications Ordered in Other Visits  Medication Dose Route Frequency Provider Last Rate Last Admin   0.9 %   sodium chloride  infusion   Intravenous Continuous Sherrod Sherrod, MD 10 mL/hr at 12/04/24 1158 New Bag at 12/04/24 1158   CARBOplatin  (PARAPLATIN ) 380 mg in sodium chloride  0.9 % 100 mL chemo infusion  380 mg Intravenous Once Sherrod Sherrod, MD       gemcitabine  (GEMZAR ) 1,710 mg in sodium chloride  0.9 % 250 mL chemo infusion  1,000 mg/m2 (Treatment Plan Recorded) Intravenous Once Sherrod Sherrod, MD       sodium chloride  flush (NS) 0.9 % injection 10 mL  10 mL Intracatheter PRN Sherrod Sherrod, MD        SURGICAL HISTORY:  Past Surgical History:  Procedure Laterality Date   APPENDECTOMY  child   BRONCHIAL BIOPSY  12/07/2022   Procedure: BRONCHIAL BIOPSIES;  Surgeon: Brenna Adine CROME, DO;  Location: [facility];  Service: Pulmonary;;   BRONCHIAL BRUSHINGS  12/07/2022   Procedure: BRONCHIAL BRUSHINGS;  Surgeon: Brenna Adine CROME, DO;  Location: [facility];  Service: Pulmonary;;   BRONCHIAL NEEDLE ASPIRATION BIOPSY  12/07/2022   Procedure: BRONCHIAL NEEDLE ASPIRATION BIOPSIES;  Surgeon: Brenna Adine CROME, DO;  Location: [facility];  Service: Pulmonary;;   CATARACT EXTRACTION W/ INTRAOCULAR LENS  IMPLANT, BILATERAL  2017   COLONOSCOPY  08/15/2012   Tubular Adenoma, No high grade dysplasia or malignacy.   CYSTOSCOPY W/ RETROGRADES Right 09/25/2016   Procedure: CYSTOSCOPY, URETHRAL DILITATION  WITH RETROGRADE PYELOGRAM, BRUSH BIOPSIES OF RIGHT RENAL MASS;  Surgeon: Arlena Gal, MD;  Location: [facility];  Service: Urology;  Laterality: Right;   CYSTOSCOPY W/ RETROGRADES Left 03/16/2017   Procedure: CYSTOSCOPY WITH RETROGRADE PYELOGRAM;  Surgeon: Cam Morene ORN, MD;  Location: [facility];  Service: Urology;  Laterality: Left;   CYSTOSCOPY W/ URETERAL STENT REMOVAL Right 09/25/2016   Procedure: CYSTOSCOPY WITH STENT REMOVAL;  Surgeon: Arlena Gal, MD;  Location: [facility] [place];  Service: Urology;  Laterality: Right;   CYSTOSCOPY  WITH RETROGRADE PYELOGRAM, URETEROSCOPY AND STENT PLACEMENT Right 08/21/2016   Procedure: CYSTOSCOPY WITH RIGHT RETROGRADE PYELOGRAM, RIGHT FLEXIBLE AND RIGID URETEROSCOPY, INSERTION DOUBLE J STENT RIGHT;  Surgeon: Arlena Gal, MD;  Location: [facility];  Service: Urology;  Laterality: Right;   EXTRACORPOREAL SHOCK WAVE LITHOTRIPSY  2009   IR IMAGING GUIDED PORT INSERTION  07/09/2023   KNEE SURGERY Right 1980's   ROBOT ASSITED LAPAROSCOPIC NEPHROURETERECTOMY Right 11/15/2016   Procedure: RIGHT XI ROBOT ASSITED LAPAROSCOPIC NEPHROURETERECTOMY;  Surgeon: Morene ORN Cam, MD;  Location:  [facility];  Service: Urology;  Laterality: Right;   TRANSURETHRAL RESECTION OF BLADDER TUMOR WITH MITOMYCIN -C Left 03/16/2017   Procedure: CYSTOSCOPY BIOPSIES OF BLADDER TUMOR WITH FULGURATION;  Surgeon: Cam Morene ORN, MD;  Location: [facility];  Service: Urology;  Laterality: Left;   URETEROSCOPY Right 09/25/2016   Procedure: RIGHT URETEROSCOPY;  Surgeon: Arlena Gal, MD;  Location: [facility];  Service: Urology;  Laterality: Right;   VIDEO BRONCHOSCOPY WITH ENDOBRONCHIAL ULTRASOUND Right 12/07/2022   Procedure: VIDEO BRONCHOSCOPY WITH ENDOBRONCHIAL ULTRASOUND;  Surgeon: Brenna Adine CROME, DO;  Location: [facility];  Service: Pulmonary;  Laterality: Right;    REVIEW OF SYSTEMS:   Review of Systems  Constitutional: Positive for fatigue and decreased appetite after treatment.  Negative for chills and fever.  HENT: Positive for taste changes and trouble swallowing. Negative for mouth sores, nosebleeds, sore throat.  Eyes: Negative for eye problems and icterus.  Respiratory: Positive for occasional dyspnea on exertion. Negative for cough, hemoptysis and wheezing.   Cardiovascular: Negative for chest pain and leg swelling.  Gastrointestinal: Negative for abdominal pain, constipation, diarrhea, nausea and vomiting.  Genitourinary: Negative for bladder  incontinence, difficulty urinating, dysuria, frequency and hematuria.   Musculoskeletal: Negative for back pain, gait problem, neck pain and neck stiffness.  Skin: Intermittent itching and rash.   Neurological: Negative for dizziness, extremity weakness, gait problem, headaches, light-headedness and seizures.  Hematological: Negative for adenopathy. Does not bruise/bleed easily.  Psychiatric/Behavioral: Negative for confusion, depression and sleep disturbance. The patient is not nervous/anxious.     PHYSICAL EXAMINATION:  Blood pressure 130/61, pulse 67, temperature 98.2 F (36.8 C), temperature source Temporal, resp. rate 19, height 5' 5 (1.651 m), weight 144 lb 12.8 oz (65.7 kg), SpO2 98%.  ECOG PERFORMANCE STATUS: 1  Physical Exam  Constitutional: Oriented to person, place, and time and well-developed, well-nourished, and in no distress. HENT:  Head: Normocephalic and atraumatic.  Mouth/Throat: Oropharynx is clear and moist. No oropharyngeal exudate.  Eyes: Conjunctivae are normal. Right eye exhibits no discharge. Left eye exhibits no discharge. No scleral icterus.  Neck: Normal range of motion. Neck supple.  Cardiovascular: Normal rate, regular rhythm, normal heart sounds and intact distal pulses.   Pulmonary/Chest: Effort normal and breath sounds normal. No respiratory distress. No wheezes. No rales.  Abdominal: Soft. Bowel sounds are normal. Exhibits no distension and no mass. There is no tenderness.  Musculoskeletal: Normal range of motion. Exhibits no edema.  Lymphadenopathy:    No cervical adenopathy.  Neurological: Alert and oriented to person, place, and time. Exhibits normal muscle tone. Gait normal. Coordination normal.  Skin: Skin is warm and dry. No rash noted. Not diaphoretic. No erythema. No pallor.  Psychiatric: Mood, memory and judgment normal.  Vitals reviewed.  LABORATORY DATA: Lab Results  Component Value Date   WBC 3.2 (L) 12/04/2024   HGB 10.7 (L)  12/04/2024   HCT 32.0 (L) 12/04/2024   MCV 81.8 12/04/2024   PLT 279 12/04/2024      Chemistry      Component Value Date/Time   NA 141 12/04/2024 1023   K 3.5 12/04/2024 1023   CL 106 12/04/2024 1023   CO2 24 12/04/2024 1023   BUN 24 (H) 12/04/2024 1023   CREATININE 0.98 12/04/2024 1023   CREATININE 0.85 12/10/2014 1550      Component Value Date/Time   CALCIUM  8.7 (L) 12/04/2024 1023   ALKPHOS 94 12/04/2024 1023   AST 23 12/04/2024 1023   ALT 30 12/04/2024 1023  BILITOT 0.3 12/04/2024 1023       RADIOGRAPHIC STUDIES:  No results found.   ASSESSMENT/PLAN:  This is a very pleasant 84 year old Caucasian male diagnosed with his initially felt to be stage IIIb (T3, N2, M0) non-small cell lung cancer, squamous cell carcinoma. He presented with 2 separate right upper lobe lung nodules in addition to mediastinal lymphadenopathy. He was diagnosed in January 2024. His PDL1 expression is 1%.  However his tissue was compared in December 2025 to his 2018 urothelial carcinoma and was felt to actually be consistent with metastatic urothelial carcinoma as opposed to lung cancer.   He also has a history of right nephroureterectomy on November 15, 2016 by Dr. Cam and the final pathology showed in situ (noninvasive) low-grade papillary urothelial carcinoma measuring 4.0 cm involving the renal pelvis with benign simple cyst and all the margins of resection were negative for carcinoma. The patient was also found to have similar findings in the bladder with biopsy showing focal invasive high-grade papillary urothelial carcinoma and the carcinoma invades the lamina propria at a level above the muscularis mucosa but the muscularis propria was present and not involved. He was treated several times with intravesical BCG until November 2018.    The patient underwent a course of concurrent chemoradiation with weekly carboplatin  for AUC of 2 and paclitaxel  45 Mg/M2.  Status post 6 cycles. Starting from  cycle #4, the taxol  was switched to abraxane  due to an infusion reaction to taxol .  The patient's last chemotherapy was on 02/05/2023. He saw Dr. Dewey from radiation oncology.    He then was on consolidation immunotherapy with Imfinzi  1500 milligrams IV every 4 weeks.  He is status post 7 cycles. His last dose was on 08/27/23.    His CT scan from October 2024 showed parahilar scarring/fibrosis. Small nodules in the medial suprahilar right lung previously have become incorporated into this more confluent soft tissue density showing a masslike nodular character measuring 4.3 x 1.7 cm. While this masslike component may be related to confluent scarring, recurrent/residual disease is a concern per radiology.   Dr. Sherrod recommended a PET scan to further evaluate. The scan showed intensely hypermetabolic solid 4.2 cm medial right upper lobe lung mass, compatible with persistent viable malignancy, with surrounding evolving post radiation change.     The patient saw Dr. Starlin at [facility]. The patient was not a surgical candidate.    The patient underwent CT-guided pulsed field electroporation of the right suprahilar mass under the care of Dr. Sharron at [facility] on 02/12/2024.   The patient is currently undergoing palliative systemic chemotherapy and immunotherapy with carboplatin  for AUC of 5 on day 1, gemcitabine  1000 mg on days 1 and 8, and Libtayo  350 mg on day 1 IV every 3 weeks.  The patient is status post 4 cycles of treatment. He is currently on maintenance immunotherapy with libtayo .    The patient was seen at [facility].  He underwent repeat pulse field electroporation December 2025.   This tissue was sent to [facility] for second opinion and they felt that the pathology and the additional staining was consistent with metastatic urothelial carcinoma as opposed to primary lung cancer.   This was reviewed again by our pathology lab who is in agreement.   Dr. Sherrod recommended  startng him on palliative systemic chemotherapy for 6 cycles with carboplatin  for an AUC of 5 and gemcitabine  on days 1 and 8 IV every 3 weeks.  The patient is  interested in this option and he is expected to start next week on 11/12/2024.   He started carboplatin  and gemcitabine  on 11/12/24. He is status post day 1 cycle 1.   Patient's labs today show white blood cell count of 3.2 and ANC of 1.4.  I reviewed this with Dr. Sherrod.  He is okay to treat today with his labs.  Dr. Sherrod does not want to adjust any of his chemotherapy doses.  The patient understands that he may have worsening neutropenia next week which may require canceling day 8 cycle 2.  Dr. Sherrod also recommends continuing on the same premedications.  The patient's itching is reported to be mild and manageable.  He was instructed to start taking an antihistamine such as Claritin and Zyrtec .  I did send him a prescription for Atarax  if no significant improvement with Claritin or Zyrtec .  He knows not to take more than 1 antihistamine.  I did caution him that Atarax  may make him drowsy.  I also sent him a Medrol  Dosepak should he develop a significant rash in the interval.  He is going to use hydrocortisone cream for localized areas of itching.  If he needs me to send a prescription for steroid cream, he was instructed to call me.  He declined for now as the over-the-counter hydrocortisone cream is effective for his itching.  Discussed with Dr. Sherrod and he would recommend waiting for the prostate biopsy until after the patient completes his 6 cycles of chemotherapy.  This is expected to be completed in mid to late April 2026.    Chemotherapy-induced anemia Mild anemia with hemoglobin at 10.9 g/dL causing fatigue and dyspnea, but not requiring transfusion. - Monitored hemoglobin; no transfusion indicated. - Educated that anemia may contribute to fatigue and dyspnea. - Continued routine laboratory monitoring during  chemotherapy.  Mild hypocalcemia Serum calcium  mildly decreased, asymptomatic. - Advised increased dietary calcium  intake. - Recommended calcium  carbonate (Tums) supplementation if desired. - Continued to monitor calcium  levels.  Will see him back for labs and a follow-up visit in 3 weeks with day 1 cycle 3.   He will continue to follow with Dr. Cam from urology.   If progression, then Dr. Sherrod would recommend treatment with Enfortumab and Keytruda. Other option includes Erdafitinib     Radiation-induced esophageal stricture with dysphagia Chronic esophageal stricture secondary to prior radiation causing persistent dysphagia. Declined endoscopic evaluation unless symptoms worsen. - If dysphagia worsens, will refer to gastroenterology for endoscopic evaluation and possible dilation.  The patient was advised to call immediately if he has any concerning symptoms in the interval. The patient voices understanding of current disease status and treatment options and is in agreement with the current care plan. All questions were answered. The patient knows to call the clinic with any problems, questions or concerns. We can certainly see the patient much sooner if necessary    No orders of the defined types were placed in this encounter.    The total time spent in the appointment was 20-29 minutes  Zalika Tieszen L Jef Futch, PA-C 12/04/24

## 2024-12-04 ENCOUNTER — Inpatient Hospital Stay

## 2024-12-04 ENCOUNTER — Inpatient Hospital Stay: Admitting: Physician Assistant

## 2024-12-04 VITALS — BP 111/63 | HR 55 | Temp 97.6°F | Resp 18

## 2024-12-04 VITALS — BP 130/61 | HR 67 | Temp 98.2°F | Resp 19 | Ht 65.0 in | Wt 144.8 lb

## 2024-12-04 DIAGNOSIS — C678 Malignant neoplasm of overlapping sites of bladder: Secondary | ICD-10-CM

## 2024-12-04 DIAGNOSIS — L299 Pruritus, unspecified: Secondary | ICD-10-CM

## 2024-12-04 DIAGNOSIS — Z5111 Encounter for antineoplastic chemotherapy: Secondary | ICD-10-CM | POA: Diagnosis not present

## 2024-12-04 DIAGNOSIS — C641 Malignant neoplasm of right kidney, except renal pelvis: Secondary | ICD-10-CM | POA: Diagnosis not present

## 2024-12-04 LAB — CMP (CANCER CENTER ONLY)
ALT: 30 U/L (ref 0–44)
AST: 23 U/L (ref 15–41)
Albumin: 3.9 g/dL (ref 3.5–5.0)
Alkaline Phosphatase: 94 U/L (ref 38–126)
Anion gap: 11 (ref 5–15)
BUN: 24 mg/dL — ABNORMAL HIGH (ref 8–23)
CO2: 24 mmol/L (ref 22–32)
Calcium: 8.7 mg/dL — ABNORMAL LOW (ref 8.9–10.3)
Chloride: 106 mmol/L (ref 98–111)
Creatinine: 0.98 mg/dL (ref 0.61–1.24)
GFR, Estimated: >60 mL/min
Glucose, Bld: 110 mg/dL — ABNORMAL HIGH (ref 70–99)
Potassium: 3.5 mmol/L (ref 3.5–5.1)
Sodium: 141 mmol/L (ref 135–145)
Total Bilirubin: 0.3 mg/dL (ref 0.0–1.2)
Total Protein: 6.6 g/dL (ref 6.5–8.1)

## 2024-12-04 LAB — CBC WITH DIFFERENTIAL (CANCER CENTER ONLY)
Abs Immature Granulocytes: 0.18 K/uL — ABNORMAL HIGH (ref 0.00–0.07)
Basophils Absolute: 0 K/uL (ref 0.0–0.1)
Basophils Relative: 1 %
Eosinophils Absolute: 0.1 K/uL (ref 0.0–0.5)
Eosinophils Relative: 2 %
HCT: 32 % — ABNORMAL LOW (ref 39.0–52.0)
Hemoglobin: 10.7 g/dL — ABNORMAL LOW (ref 13.0–17.0)
Immature Granulocytes: 6 %
Lymphocytes Relative: 23 %
Lymphs Abs: 0.7 K/uL (ref 0.7–4.0)
MCH: 27.4 pg (ref 26.0–34.0)
MCHC: 33.4 g/dL (ref 30.0–36.0)
MCV: 81.8 fL (ref 80.0–100.0)
Monocytes Absolute: 0.8 K/uL (ref 0.1–1.0)
Monocytes Relative: 26 %
Neutro Abs: 1.4 K/uL — ABNORMAL LOW (ref 1.7–7.7)
Neutrophils Relative %: 42 %
Platelet Count: 279 K/uL (ref 150–400)
RBC: 3.91 MIL/uL — ABNORMAL LOW (ref 4.22–5.81)
RDW: 15.8 % — ABNORMAL HIGH (ref 11.5–15.5)
Smear Review: NORMAL
WBC Count: 3.2 K/uL — ABNORMAL LOW (ref 4.0–10.5)
nRBC: 0 % (ref 0.0–0.2)

## 2024-12-04 MED ORDER — DEXAMETHASONE SOD PHOSPHATE PF 10 MG/ML IJ SOLN
10.0000 mg | Freq: Once | INTRAMUSCULAR | Status: AC
  Administered 2024-12-04: 10 mg via INTRAVENOUS
  Filled 2024-12-04: qty 1

## 2024-12-04 MED ORDER — PALONOSETRON HCL INJECTION 0.25 MG/5ML
0.2500 mg | Freq: Once | INTRAVENOUS | Status: AC
  Administered 2024-12-04: 0.25 mg via INTRAVENOUS
  Filled 2024-12-04: qty 5

## 2024-12-04 MED ORDER — METHYLPREDNISOLONE 4 MG PO TBPK: ORAL_TABLET | ORAL | 0 refills | Status: AC

## 2024-12-04 MED ORDER — SODIUM CHLORIDE 0.9% FLUSH: 10.0000 mL | INTRAVENOUS | Status: DC | PRN

## 2024-12-04 MED ORDER — SODIUM CHLORIDE 0.9 % IV SOLN
1000.0000 mg/m2 | Freq: Once | INTRAVENOUS | Status: AC
  Administered 2024-12-04: 1710 mg via INTRAVENOUS
  Filled 2024-12-04: qty 44.97

## 2024-12-04 MED ORDER — SODIUM CHLORIDE 0.9 % IV SOLN
375.0000 mg | Freq: Once | INTRAVENOUS | Status: AC
  Administered 2024-12-04: 380 mg via INTRAVENOUS
  Filled 2024-12-04: qty 38

## 2024-12-04 MED ORDER — CETIRIZINE HCL 10 MG/ML IV SOLN
10.0000 mg | Freq: Once | INTRAVENOUS | Status: AC
  Administered 2024-12-04: 10 mg via INTRAVENOUS
  Filled 2024-12-04: qty 1

## 2024-12-04 MED ORDER — HYDROXYZINE HCL 10 MG PO TABS: 10.0000 mg | ORAL_TABLET | Freq: Three times a day (TID) | ORAL | 0 refills | Status: AC | PRN

## 2024-12-04 MED ORDER — SODIUM CHLORIDE 0.9 % IV SOLN: INTRAVENOUS | Status: DC

## 2024-12-04 MED ORDER — FAMOTIDINE IN NACL 20-0.9 MG/50ML-% IV SOLN
20.0000 mg | Freq: Once | INTRAVENOUS | Status: AC
  Administered 2024-12-04: 20 mg via INTRAVENOUS
  Filled 2024-12-04: qty 50

## 2024-12-04 MED ORDER — APREPITANT 130 MG/18ML IV EMUL
130.0000 mg | Freq: Once | INTRAVENOUS | Status: AC
  Administered 2024-12-04: 130 mg via INTRAVENOUS
  Filled 2024-12-04: qty 18

## 2024-12-04 NOTE — Patient Instructions (Signed)
 [facility] - A DEPT OF [facility]   Discharge Instructions: Thank you for choosing [facility] to provide your oncology and hematology care.   If you have a lab appointment with the Cancer Center, please go directly to the Cancer Center and check in at the registration area.   Wear comfortable clothing and clothing appropriate for easy access to any Portacath or PICC line.   We strive to give you quality time with your provider. You may need to reschedule your appointment if you arrive late (15 or more minutes).  Arriving late affects you and other patients whose appointments are after yours.  Also, if you miss three or more appointments without notifying the office, you may be dismissed from the clinic at the provider's discretion.      For prescription refill requests, have your pharmacy contact our office and allow 72 hours for refills to be completed.    Today you received the following chemotherapy and/or immunotherapy agents: Gemcitabine (Gemzar) and Carboplatin       To help prevent nausea and vomiting after your treatment, we encourage you to take your nausea medication as directed.  BELOW ARE SYMPTOMS THAT SHOULD BE REPORTED IMMEDIATELY: *FEVER GREATER THAN 100.4 F (38 C) OR HIGHER *CHILLS OR SWEATING *NAUSEA AND VOMITING THAT IS NOT CONTROLLED WITH YOUR NAUSEA MEDICATION *UNUSUAL SHORTNESS OF BREATH *UNUSUAL BRUISING OR BLEEDING *URINARY PROBLEMS (pain or burning when urinating, or frequent urination) *BOWEL PROBLEMS (unusual diarrhea, constipation, pain near the anus) TENDERNESS IN MOUTH AND THROAT WITH OR WITHOUT PRESENCE OF ULCERS (sore throat, sores in mouth, or a toothache) UNUSUAL RASH, SWELLING OR PAIN  UNUSUAL VAGINAL DISCHARGE OR ITCHING   Items with * indicate a potential emergency and should be followed up as soon as possible or go to the Emergency Department if any problems should occur.  Please show the  CHEMOTHERAPY ALERT CARD or IMMUNOTHERAPY ALERT CARD at check-in to the Emergency Department and triage nurse.  Should you have questions after your visit or need to cancel or reschedule your appointment, please contact [facility] - A DEPT OF [facility]  Dept: [phone]  and follow the prompts.  Office hours are 8:00 a.m. to 4:30 p.m. Monday - Friday. Please note that voicemails left after 4:00 p.m. may not be returned until the following business day.  We are closed weekends and major holidays. You have access to a nurse at all times for urgent questions. Please call the main number to the clinic Dept: [phone] and follow the prompts.   For any non-urgent questions, you may also contact your provider using MyChart. We now offer e-Visits for anyone 20 and older to request care online for non-urgent symptoms. For details visit [email or web address].   Also download the MyChart app! Go to the app store, search "MyChart", open the app, select [place], and log in with your MyChart username and password.

## 2024-12-11 ENCOUNTER — Inpatient Hospital Stay

## 2024-12-11 VITALS — BP 134/75 | HR 81 | Temp 98.6°F | Resp 18 | Wt 140.0 lb

## 2024-12-11 DIAGNOSIS — C678 Malignant neoplasm of overlapping sites of bladder: Secondary | ICD-10-CM

## 2024-12-11 DIAGNOSIS — Z5111 Encounter for antineoplastic chemotherapy: Secondary | ICD-10-CM | POA: Diagnosis not present

## 2024-12-11 LAB — CMP (CANCER CENTER ONLY)
ALT: 31 U/L (ref 0–44)
AST: 33 U/L (ref 15–41)
Albumin: 4.1 g/dL (ref 3.5–5.0)
Alkaline Phosphatase: 109 U/L (ref 38–126)
Anion gap: 11 (ref 5–15)
BUN: 21 mg/dL (ref 8–23)
CO2: 24 mmol/L (ref 22–32)
Calcium: 8.6 mg/dL — ABNORMAL LOW (ref 8.9–10.3)
Chloride: 105 mmol/L (ref 98–111)
Creatinine: 0.86 mg/dL (ref 0.61–1.24)
GFR, Estimated: >60 mL/min
Glucose, Bld: 93 mg/dL (ref 70–99)
Potassium: 4.2 mmol/L (ref 3.5–5.1)
Sodium: 140 mmol/L (ref 135–145)
Total Bilirubin: 0.4 mg/dL (ref 0.0–1.2)
Total Protein: 7.1 g/dL (ref 6.5–8.1)

## 2024-12-11 LAB — CBC WITH DIFFERENTIAL (CANCER CENTER ONLY)
Abs Immature Granulocytes: 0.07 10*3/uL (ref 0.00–0.07)
Basophils Absolute: 0 10*3/uL (ref 0.0–0.1)
Basophils Relative: 1 %
Eosinophils Absolute: 0 10*3/uL (ref 0.0–0.5)
Eosinophils Relative: 1 %
HCT: 31.7 % — ABNORMAL LOW (ref 39.0–52.0)
Hemoglobin: 10.6 g/dL — ABNORMAL LOW (ref 13.0–17.0)
Immature Granulocytes: 2 %
Lymphocytes Relative: 15 %
Lymphs Abs: 0.5 10*3/uL — ABNORMAL LOW (ref 0.7–4.0)
MCH: 27.2 pg (ref 26.0–34.0)
MCHC: 33.4 g/dL (ref 30.0–36.0)
MCV: 81.5 fL (ref 80.0–100.0)
Monocytes Absolute: 0.4 10*3/uL (ref 0.1–1.0)
Monocytes Relative: 12 %
Neutro Abs: 2.2 10*3/uL (ref 1.7–7.7)
Neutrophils Relative %: 69 %
Platelet Count: 138 10*3/uL — ABNORMAL LOW (ref 150–400)
RBC: 3.89 MIL/uL — ABNORMAL LOW (ref 4.22–5.81)
RDW: 15.2 % (ref 11.5–15.5)
WBC Count: 3.3 10*3/uL — ABNORMAL LOW (ref 4.0–10.5)
nRBC: 0 % (ref 0.0–0.2)

## 2024-12-11 MED ORDER — PROCHLORPERAZINE MALEATE 10 MG PO TABS
10.0000 mg | ORAL_TABLET | Freq: Once | ORAL | Status: AC
  Administered 2024-12-11: 10 mg via ORAL
  Filled 2024-12-11: qty 1

## 2024-12-11 MED ORDER — SODIUM CHLORIDE 0.9 % IV SOLN: INTRAVENOUS | Status: DC

## 2024-12-11 MED ORDER — SODIUM CHLORIDE 0.9 % IV SOLN
1000.0000 mg/m2 | Freq: Once | INTRAVENOUS | Status: AC
  Administered 2024-12-11: 1710 mg via INTRAVENOUS
  Filled 2024-12-11: qty 44.97

## 2024-12-11 NOTE — Patient Instructions (Signed)
 [facility] - A DEPT OF [facility]  Discharge Instructions: Thank you for choosing [facility] to provide your oncology and hematology care.   If you have a lab appointment with the Cancer Center, please go directly to the Cancer Center and check in at the registration area.   Wear comfortable clothing and clothing appropriate for easy access to any Portacath or PICC line.   We strive to give you quality time with your provider. You may need to reschedule your appointment if you arrive late (15 or more minutes).  Arriving late affects you and other patients whose appointments are after yours.  Also, if you miss three or more appointments without notifying the office, you may be dismissed from the clinic at the provider's discretion.      For prescription refill requests, have your pharmacy contact our office and allow 72 hours for refills to be completed.    Today you received the following chemotherapy and/or immunotherapy agents: Gemcitabine (Gemzar)      To help prevent nausea and vomiting after your treatment, we encourage you to take your nausea medication as directed.  BELOW ARE SYMPTOMS THAT SHOULD BE REPORTED IMMEDIATELY: *FEVER GREATER THAN 100.4 F (38 C) OR HIGHER *CHILLS OR SWEATING *NAUSEA AND VOMITING THAT IS NOT CONTROLLED WITH YOUR NAUSEA MEDICATION *UNUSUAL SHORTNESS OF BREATH *UNUSUAL BRUISING OR BLEEDING *URINARY PROBLEMS (pain or burning when urinating, or frequent urination) *BOWEL PROBLEMS (unusual diarrhea, constipation, pain near the anus) TENDERNESS IN MOUTH AND THROAT WITH OR WITHOUT PRESENCE OF ULCERS (sore throat, sores in mouth, or a toothache) UNUSUAL RASH, SWELLING OR PAIN  UNUSUAL VAGINAL DISCHARGE OR ITCHING   Items with * indicate a potential emergency and should be followed up as soon as possible or go to the Emergency Department if any problems should occur.  Please show the CHEMOTHERAPY ALERT CARD or  IMMUNOTHERAPY ALERT CARD at check-in to the Emergency Department and triage nurse.  Should you have questions after your visit or need to cancel or reschedule your appointment, please contact [facility] - A DEPT OF [facility]  Dept: [phone]  and follow the prompts.  Office hours are 8:00 a.m. to 4:30 p.m. Monday - Friday. Please note that voicemails left after 4:00 p.m. may not be returned until the following business day.  We are closed weekends and major holidays. You have access to a nurse at all times for urgent questions. Please call the main number to the clinic Dept: [phone] and follow the prompts.   For any non-urgent questions, you may also contact your provider using MyChart. We now offer e-Visits for anyone 93 and older to request care online for non-urgent symptoms. For details visit [email or web address].   Also download the MyChart app! Go to the app store, search "MyChart", open the app, select [place], and log in with your MyChart username and password.

## 2024-12-24 ENCOUNTER — Inpatient Hospital Stay: Attending: Internal Medicine

## 2024-12-24 ENCOUNTER — Inpatient Hospital Stay

## 2024-12-24 ENCOUNTER — Inpatient Hospital Stay: Admitting: Internal Medicine

## 2024-12-31 ENCOUNTER — Inpatient Hospital Stay

## 2025-01-14 ENCOUNTER — Inpatient Hospital Stay

## 2025-01-14 ENCOUNTER — Inpatient Hospital Stay: Admitting: Internal Medicine

## 2025-01-20 ENCOUNTER — Inpatient Hospital Stay

## 2025-01-22 ENCOUNTER — Inpatient Hospital Stay: Attending: Internal Medicine

## 2025-02-04 ENCOUNTER — Inpatient Hospital Stay

## 2025-02-04 ENCOUNTER — Inpatient Hospital Stay: Admitting: Physician Assistant

## 2025-02-11 ENCOUNTER — Inpatient Hospital Stay

## 2025-02-11 ENCOUNTER — Inpatient Hospital Stay: Attending: Internal Medicine

## 2025-02-25 ENCOUNTER — Inpatient Hospital Stay

## 2025-02-25 ENCOUNTER — Inpatient Hospital Stay: Admitting: Internal Medicine

## 2025-03-04 ENCOUNTER — Inpatient Hospital Stay
